# Patient Record
Sex: Male | Born: 1945 | Race: White | Hispanic: No | Marital: Single | Smoking: Never smoker
Health system: Southern US, Community
[De-identification: ages and names within clinical notes are randomized; demographics above are authoritative.]

## PROBLEM LIST (undated history)

## (undated) DIAGNOSIS — G4700 Insomnia, unspecified: Secondary | ICD-10-CM

## (undated) DIAGNOSIS — E119 Type 2 diabetes mellitus without complications: Secondary | ICD-10-CM

## (undated) DIAGNOSIS — C801 Malignant (primary) neoplasm, unspecified: Secondary | ICD-10-CM

## (undated) DIAGNOSIS — E7800 Pure hypercholesterolemia, unspecified: Secondary | ICD-10-CM

## (undated) DIAGNOSIS — C189 Malignant neoplasm of colon, unspecified: Secondary | ICD-10-CM

## (undated) DIAGNOSIS — J189 Pneumonia, unspecified organism: Secondary | ICD-10-CM

## (undated) DIAGNOSIS — Z87442 Personal history of urinary calculi: Secondary | ICD-10-CM

## (undated) DIAGNOSIS — I4891 Unspecified atrial fibrillation: Secondary | ICD-10-CM

## (undated) DIAGNOSIS — I499 Cardiac arrhythmia, unspecified: Secondary | ICD-10-CM

## (undated) DIAGNOSIS — D649 Anemia, unspecified: Secondary | ICD-10-CM

## (undated) HISTORY — PX: COLON SURGERY: SHX602

## (undated) HISTORY — PX: NO PAST SURGERIES: SHX2092

## (undated) HISTORY — PX: COLONOSCOPY: SHX174

## (undated) HISTORY — PX: COLON RESECTION: SHX5231

## (undated) HISTORY — PX: TONSILLECTOMY: SUR1361

## (undated) HISTORY — DX: Insomnia, unspecified: G47.00

## (undated) HISTORY — DX: Type 2 diabetes mellitus without complications: E11.9

## (undated) HISTORY — DX: Pure hypercholesterolemia, unspecified: E78.00

---
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
## 2004-10-25 ENCOUNTER — Inpatient Hospital Stay (HOSPITAL_COMMUNITY): Admission: EM | Admit: 2004-10-25 | Discharge: 2004-10-27 | Payer: Self-pay | Admitting: Emergency Medicine

## 2005-08-24 ENCOUNTER — Encounter: Admission: RE | Admit: 2005-08-24 | Discharge: 2005-08-24 | Payer: Self-pay | Admitting: *Deleted

## 2009-10-02 ENCOUNTER — Emergency Department (HOSPITAL_COMMUNITY): Admission: AC | Admit: 2009-10-02 | Discharge: 2009-10-03 | Payer: Self-pay

## 2010-10-27 LAB — CBC
HCT: 45 % (ref 39.0–52.0)
Hemoglobin: 15.6 g/dL (ref 13.0–17.0)
MCHC: 34.5 g/dL (ref 30.0–36.0)
MCV: 91.6 fL (ref 78.0–100.0)
Platelets: 276 10*3/uL (ref 150–400)
RBC: 4.92 MIL/uL (ref 4.22–5.81)

## 2010-10-27 LAB — POCT I-STAT, CHEM 8
BUN: 19 mg/dL (ref 6–23)
Calcium, Ion: 1 mmol/L — ABNORMAL LOW (ref 1.12–1.32)
Chloride: 107 meq/L (ref 96–112)
Creatinine, Ser: 0.8 mg/dL (ref 0.4–1.5)
Potassium: 3.6 meq/L (ref 3.5–5.1)

## 2010-10-27 LAB — COMPREHENSIVE METABOLIC PANEL
BUN: 17 mg/dL (ref 6–23)
CO2: 25 mEq/L (ref 19–32)
Chloride: 106 mEq/L (ref 96–112)
Creatinine, Ser: 0.87 mg/dL (ref 0.4–1.5)

## 2010-10-27 LAB — DIFFERENTIAL
Eosinophils Relative: 0 % (ref 0–5)
Lymphs Abs: 1.3 10*3/uL (ref 0.7–4.0)
Monocytes Absolute: 0.9 10*3/uL (ref 0.1–1.0)
Monocytes Relative: 4 % (ref 3–12)
Neutrophils Relative %: 90 % — ABNORMAL HIGH (ref 43–77)

## 2010-12-20 NOTE — Discharge Summary (Signed)
NAME:  ESSIE, GEHRET                ACCOUNT NO.:  [id number]   MEDICAL RECORD NO.:  [id number]          PATIENT TYPE:  INP   LOCATION:  3728                         FACILITY:  [facility]   PHYSICIAN:  Francisca December, M.D.  DATE OF BIRTH:  [birth year]   DATE OF ADMISSION:  10/25/2004  DATE OF DISCHARGE:  10/27/2004                                 DISCHARGE SUMMARY   CHIEF COMPLAINT/REASON FOR ADMISSION:  Mr. Mcgillis is a 65 year old male with  no personal coronary risk factors and no significant past medical history,  who presented to the ER with complaints of atypical chest discomfort,  duration less than 24 hours, intermittent pattern with location in the left  anterior chest.  He was awakened at 6:00 a.m. with left anterior chest pain.  The pain would last only a few seconds and did not have any associated  symptoms.  He describes the pain as being quite severe, level 8/10.  The  patient went to work but chest pain continued and he finally presented to  the emergency department.  Prior to the day of admission, he had not had any  type of chest pain or exertional symptoms as dyspnea or chest discomfort or  near syncope or dizziness.  In the ER, he received oxygen and four baby  aspirin and is completely chest pain-free.  It is important to note that the  patient's family medical history is significant for father and sister who  have coronary artery disease and they are diabetic and have a smoking  history.  He has a brother who has MI history and he is a smoker,  noting  that the father, the sister, and the brother are all deceased.  The patient  has never smoked.  Does not use alcohol and lives with his mother.   PHYSICAL EXAMINATION:  Vital signs are stable.  BP 141/73.  Exam was  unremarkable.   Initial EKG showed sinus bradycardia, scooping ST segments in the anterior  leads and lateral leads.  Lead 3 with a horizontal 1 mm depression and  prominent R wave in V3 but there were no old  EKGs for comparison.  His point  of care enzymes were  negative x2.  His lab values were unremarkable.  Chest  x-ray showed cardiomegaly.   The patient was admitted by Dr. Fraser Din with the following diagnoses:  1.  Atypical chest pain.  2.  Borderline hypertension.   HOSPITAL COURSE:  Problem 1. Chest pain.  The patient was admitted to the  telemetry unit where he was started on serial cardiac isoenzyme assessments,  routine telemetry, Lovenox b.i.d.  The patient had no further chest pain by  the following morning.  He did have a mild elevation in his total CK of 367  but troponin stayed normal.  MB was mildly elevated at 11.2.  EKG remained  stable without ischemic changes.  The patient subsequently underwent an  adenosine Cardiolite study with Dr. Amil Amen.  Tolerated this procedure well.  Myoview images are normal with diaphragmatic attenuation.  Cardiac  isoenzymes by the cardiac panel method were  all normal, again only  demonstrating a mild elevation of MB of 11.2, trending downwards to 8.7 just  before discharge with normal troponins and mildly elevated CPKs.  Blood  pressure was 112/62 on date of discharge with a heart rate of 60.  Since  patient remained chest pain-free and Cardiolite was negative, it was  determined that the patient was appropriate for discharge home.   FINAL DIAGNOSES:  1.  Atypical chest pain.  2.  Nonischemic adenosine Myoview.  3.  Hypertension, now controlled off medications.   DISCHARGE MEDICATIONS:  Begin aspirin 81 mg daily, enteric coated.   ACTIVITY:  Out of work until October 30, 2004.   FOLLOW UP APPOINTMENTS:  He is to follow up with [facility], either  Dr. Fraser Din or Dr. Amil Amen if chest pain returns.  He has been given the  phone number to call.  He has also been instructed to return to the hospital  if his chest pain recurs as well.      ALE/MEDQ  D:  12/27/2004  T:  12/28/2004  Job:  [id number]

## 2011-10-15 ENCOUNTER — Ambulatory Visit (INDEPENDENT_AMBULATORY_CARE_PROVIDER_SITE_OTHER): Payer: 59 | Admitting: Physician Assistant

## 2011-10-15 VITALS — BP 151/65 | HR 59 | Temp 97.6°F | Resp 18 | Ht 65.5 in | Wt 191.0 lb

## 2011-10-15 DIAGNOSIS — J40 Bronchitis, not specified as acute or chronic: Secondary | ICD-10-CM

## 2011-10-15 DIAGNOSIS — J019 Acute sinusitis, unspecified: Secondary | ICD-10-CM

## 2011-10-15 DIAGNOSIS — R05 Cough: Secondary | ICD-10-CM

## 2011-10-15 DIAGNOSIS — R059 Cough, unspecified: Secondary | ICD-10-CM

## 2011-10-15 DIAGNOSIS — G4700 Insomnia, unspecified: Secondary | ICD-10-CM

## 2011-10-15 MED ORDER — IPRATROPIUM BROMIDE 0.06 % NA SOLN: 2.0000 | Freq: Three times a day (TID) | NASAL | Status: DC

## 2011-10-15 MED ORDER — ZOLPIDEM TARTRATE 10 MG PO TABS: 10.0000 mg | ORAL_TABLET | Freq: Every evening | ORAL | Status: DC | PRN

## 2011-10-15 MED ORDER — CEFDINIR 300 MG PO CAPS: 300.0000 mg | ORAL_CAPSULE | Freq: Two times a day (BID) | ORAL | Status: AC

## 2011-10-15 MED ORDER — HYDROCODONE-HOMATROPINE 5-1.5 MG/5ML PO SYRP: ORAL_SOLUTION | ORAL | Status: AC

## 2011-10-15 NOTE — Progress Notes (Signed)
Patient ID: William Oneill MRN: [id number], DOB: 1946-05-09, 66 y.o. Date of Encounter: 10/15/2011, 11:01 AM  Primary Physician: No primary provider on file.  Chief Complaint:  Chief Complaint  Patient presents with  . Cough    yellow phlegm  . Sinusitis  . Fatigue    x yesterday    HPI: 66 y.o. year old male presents with 2-3 day history of nasal congestion, post nasal drip, sore throat, sinus pressure, and cough. Afebrile. No chills. Nasal congestion thick and green/yellow. Cough is productive of green/yellow sputum and worse at night. Ears feel full, leading to sensation of muffled hearing. Has tried OTC cold preps without success. No GI complaints. Appetite fair.  No sick contacts, recent antibiotics, or recent travels.   No leg trauma, sedentary periods, h/o cancer, or tobacco use.  Past Medical History  Diagnosis Date  . Insomnia      Home Meds: Prior to Admission medications   Medication Sig Start Date End Date Taking? Authorizing Provider  aspirin 81 MG tablet Take 81 mg by mouth daily.   Yes Historical Provider, MD  multivitamin [facility]) per tablet Take 1 tablet by mouth daily.   Yes Historical Provider, MD    Allergies: No Known Allergies  History   Social History  . Marital Status: Married    Spouse Name: N/A    Number of Children: N/A  . Years of Education: N/A   Occupational History  . Not on file.   Social History Main Topics  . Smoking status: Never Smoker   . Smokeless tobacco: Not on file  . Alcohol Use: Not on file  . Drug Use: Not on file  . Sexually Active: Not on file   Other Topics Concern  . Not on file   Social History Narrative  . No narrative on file     Review of Systems: Constitutional: negative for chills, fever, night sweats or weight changes Cardiovascular: negative for chest pain or palpitations Respiratory: negative for hemoptysis, wheezing, or shortness of breath Abdominal: negative for abdominal pain, nausea,  vomiting or diarrhea Dermatological: negative for rash Neurologic: negative for headache   Physical Exam: Blood pressure 151/65, pulse 59, temperature 97.6 F (36.4 C), temperature source Oral, resp. rate 18, height 5' 5.5" (1.664 m), weight 191 lb (86.637 kg), SpO2 98.00%., Body mass index is 31.30 kg/(m^2). General: Well developed, well nourished, in no acute distress. Head: Normocephalic, atraumatic, eyes without discharge, sclera non-icteric, nares are congested. Bilateral auditory canals clear, TM's are without perforation, pearly grey with reflective cone of light bilaterally. Serous effusion bilaterally behind TM's. Maxillary and frontal sinus TTP bilaterally. Oral cavity moist, dentition normal. Posterior pharynx with post nasal drip and mild erythema. No peritonsillar abscess or tonsillar exudate. Neck: Supple. No thyromegaly. Full ROM. No lymphadenopathy. Lungs: Clear bilaterally to auscultation without wheezes, rales, or rhonchi. Breathing is unlabored.  Heart: RRR with S1 S2. No murmurs, rubs, or gallops appreciated. Msk:  Strength and tone normal for age. Extremities: No clubbing or cyanosis. No edema. Neuro: Alert and oriented X 3. Moves all extremities spontaneously. CNII-XII grossly in tact. Psych:  Responds to questions appropriately with a normal affect.     ASSESSMENT AND PLAN:  66 y.o. year old male with sinobronchitis and insomnia. 1. Sinobronchitis -Omnicef 300 mg 1 po bid #20 no RF  -Atrovent NS 0.06% 2 sprays each nare bid prn #1 no RF -Hycodan #4oz 1 tsp po q 4-6 hours prn cough no RF SED -Mucinex -Tylenol/Motrin  prn -Rest/fluids -RTC precautions -RTC 3-5 days if no improvement  2. Insomnia -Ambien 10 mg #30 1 po qhs prn insomnia no RF -May refill for 2 months  3. Needs CPE -Patient advised to RTC for CPE  Signed, Eula Listen, PA-C 10/15/2011 11:01 AM

## 2011-11-25 ENCOUNTER — Other Ambulatory Visit: Payer: Self-pay

## 2011-11-25 MED ORDER — ZOLPIDEM TARTRATE 10 MG PO TABS: 10.0000 mg | ORAL_TABLET | Freq: Every evening | ORAL | Status: DC | PRN

## 2012-01-05 ENCOUNTER — Ambulatory Visit (INDEPENDENT_AMBULATORY_CARE_PROVIDER_SITE_OTHER): Payer: 59 | Admitting: Family Medicine

## 2012-01-05 ENCOUNTER — Ambulatory Visit: Payer: 59

## 2012-01-05 VITALS — BP 148/77 | HR 60 | Temp 98.6°F | Resp 18 | Ht 66.0 in | Wt 190.0 lb

## 2012-01-05 DIAGNOSIS — G4700 Insomnia, unspecified: Secondary | ICD-10-CM

## 2012-01-05 DIAGNOSIS — J209 Acute bronchitis, unspecified: Secondary | ICD-10-CM

## 2012-01-05 DIAGNOSIS — J029 Acute pharyngitis, unspecified: Secondary | ICD-10-CM

## 2012-01-05 DIAGNOSIS — R05 Cough: Secondary | ICD-10-CM

## 2012-01-05 LAB — POCT CBC
Granulocyte percent: 71.3 %G (ref 37–80)
HCT, POC: 45.2 % (ref 43.5–53.7)
Hemoglobin: 15 g/dL (ref 14.1–18.1)
Lymph, poc: 2.1 (ref 0.6–3.4)
MCH, POC: 29.7 pg (ref 27–31.2)
MCHC: 33.2 g/dL (ref 31.8–35.4)
MCV: 89.5 fL (ref 80–97)
MID (cbc): 0.6 (ref 0–0.9)
MPV: 9.8 fL (ref 0–99.8)
POC Granulocyte: 6.7 (ref 2–6.9)
POC LYMPH PERCENT: 22.5 %L (ref 10–50)
POC MID %: 6.2 %M (ref 0–12)
Platelet Count, POC: 309 10*3/uL (ref 142–424)
RBC: 5.05 M/uL (ref 4.69–6.13)
RDW, POC: 13.2 %
WBC: 9.4 10*3/uL (ref 4.6–10.2)

## 2012-01-05 LAB — POCT RAPID STREP A (OFFICE): Rapid Strep A Screen: NEGATIVE

## 2012-01-05 MED ORDER — AZITHROMYCIN 250 MG PO TABS: ORAL_TABLET | ORAL | Status: AC

## 2012-01-05 MED ORDER — HYDROCODONE-HOMATROPINE 5-1.5 MG/5ML PO SYRP: 5.0000 mL | ORAL_SOLUTION | Freq: Three times a day (TID) | ORAL | Status: AC | PRN

## 2012-01-05 MED ORDER — ZOLPIDEM TARTRATE 10 MG PO TABS: 10.0000 mg | ORAL_TABLET | Freq: Every evening | ORAL | Status: DC | PRN

## 2012-01-05 NOTE — Patient Instructions (Signed)

## 2012-01-05 NOTE — Progress Notes (Signed)
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 He comes in with 2 days of progressive sore throat, headache, myalgia, and cough. He is a much of the night with a cough which is only minimally productive. He had a significant sweating spell overnight and now he is tired but feeling a little better as far as his muscle and body maladies ago.  No nausea vomiting nor diarrhea.  Objective: No acute distress  HEENT: Markedly erythematous throat. He seems to have a mild exophoria with his gaze being slightly disconjugate. There is no icterus. TMs are normal.  Neck is supple with no adenopathy or thyromegaly Chest: Few rales at both bases Heart: No murmur, regular, no gallop or rub Abdomen: Soft nontender without HSM  [facility] reading (PRIMARY) by  Dr. Milus Glazier CXR  cardiomegaly, hazy left lung fields Results for orders placed in visit on 01/05/12  POCT CBC      Component Value Range   WBC 9.4  4.6 - 10.2 (K/uL)   Lymph, poc 2.1  0.6 - 3.4    POC LYMPH PERCENT 22.5  10 - 50 (%L)   MID (cbc) 0.6  0 - 0.9    POC MID % 6.2  0 - 12 (%M)   POC Granulocyte 6.7  2 - 6.9    Granulocyte percent 71.3  37 - 80 (%G)   RBC 5.05  4.69 - 6.13 (M/uL)   Hemoglobin 15.0  14.1 - 18.1 (g/dL)   HCT, POC 16.1  09.6 - 53.7 (%)   MCV 89.5  80 - 97 (fL)   MCH, POC 29.7  27 - 31.2 (pg)   MCHC 33.2  31.8 - 35.4 (g/dL)   RDW, POC 04.5     Platelet Count, POC 309  142 - 424 (K/uL)   MPV 9.8  0 - 99.8 (fL)  POCT RAPID STREP A (OFFICE)      Component Value Range   Rapid Strep A Screen Negative  Negative    . Assessment: Bronchitis, acute without significant respiratory compromise  Plan: Z-Pak and Hydromet with followup in 40

## 2012-01-07 ENCOUNTER — Ambulatory Visit (INDEPENDENT_AMBULATORY_CARE_PROVIDER_SITE_OTHER): Payer: 59 | Admitting: Family Medicine

## 2012-01-07 VITALS — BP 118/68 | HR 65 | Temp 97.5°F | Resp 18 | Ht 66.0 in | Wt 190.0 lb

## 2012-01-07 DIAGNOSIS — J40 Bronchitis, not specified as acute or chronic: Secondary | ICD-10-CM

## 2012-01-07 DIAGNOSIS — J209 Acute bronchitis, unspecified: Secondary | ICD-10-CM

## 2012-01-07 MED ORDER — PREDNISONE 20 MG PO TABS: 40.0000 mg | ORAL_TABLET | Freq: Every day | ORAL | Status: AC

## 2012-01-07 NOTE — Progress Notes (Signed)
66 year old gentleman comes in for recheck of his bronchitis. Overall he is having less respiratory symptoms-less cough and shortness of breath-but he still is feeling very tired and complains of a running nose.  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 He's been staying at home lately though she can't keep up on his energy level.  He's had no new fever, no nausea or vomiting, and he's been taking his medicine as directed.  Objective: No acute distress HEENT: Unremarkable Neck: Supple no adenopathy or thyromegaly Chest: No rhonchi or rales, good breath sounds bilaterally Heart: Regular no murmur Skin: No rashes Extremities: No edema  Assessment: Gradual but slow improvement on the bronchitis.  Plan: Add prednisone 40 mg daily x5 and recheck if symptoms persist. 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

## 2012-02-21 ENCOUNTER — Other Ambulatory Visit: Payer: Self-pay | Admitting: Physician Assistant

## 2012-02-23 ENCOUNTER — Other Ambulatory Visit: Payer: Self-pay

## 2012-03-30 ENCOUNTER — Other Ambulatory Visit: Payer: Self-pay

## 2012-03-30 MED ORDER — ZOLPIDEM TARTRATE 10 MG PO TABS: 10.0000 mg | ORAL_TABLET | Freq: Every evening | ORAL | Status: DC | PRN

## 2012-05-02 ENCOUNTER — Other Ambulatory Visit: Payer: Self-pay | Admitting: *Deleted

## 2012-05-02 MED ORDER — ZOLPIDEM TARTRATE 10 MG PO TABS: 10.0000 mg | ORAL_TABLET | Freq: Every evening | ORAL | Status: DC | PRN

## 2012-06-08 ENCOUNTER — Other Ambulatory Visit: Payer: Self-pay | Admitting: Physician Assistant

## 2012-06-10 ENCOUNTER — Other Ambulatory Visit: Payer: Self-pay | Admitting: Radiology

## 2012-07-20 ENCOUNTER — Other Ambulatory Visit: Payer: Self-pay | Admitting: Physician Assistant

## 2012-07-21 ENCOUNTER — Other Ambulatory Visit: Payer: Self-pay | Admitting: Radiology

## 2012-11-13 ENCOUNTER — Other Ambulatory Visit: Payer: Self-pay | Admitting: Family Medicine

## 2012-12-15 ENCOUNTER — Telehealth: Payer: Self-pay

## 2012-12-15 NOTE — Telephone Encounter (Signed)
Pharm requests RF of zolpidem 10 mg.

## 2012-12-16 ENCOUNTER — Other Ambulatory Visit: Payer: Self-pay | Admitting: Family Medicine

## 2012-12-16 DIAGNOSIS — G4700 Insomnia, unspecified: Secondary | ICD-10-CM

## 2012-12-16 MED ORDER — ZOLPIDEM TARTRATE 10 MG PO TABS: 10.0000 mg | ORAL_TABLET | Freq: Every evening | ORAL | Status: DC | PRN

## 2012-12-16 NOTE — Telephone Encounter (Signed)
Faxed prescription for Ambien 10 mg to [facility] pharmacy [phone], per Dr Milus Glazier.

## 2012-12-16 NOTE — Telephone Encounter (Signed)
Dr L, there is also a phone message in concerning this RF.

## 2012-12-16 NOTE — Telephone Encounter (Signed)
I refilled this 

## 2013-04-29 ENCOUNTER — Other Ambulatory Visit: Payer: Self-pay | Admitting: Family Medicine

## 2013-04-29 NOTE — Telephone Encounter (Signed)
Please advise on refill of Ambien

## 2013-04-30 ENCOUNTER — Other Ambulatory Visit: Payer: Self-pay

## 2013-06-09 ENCOUNTER — Other Ambulatory Visit: Payer: Self-pay | Admitting: Family Medicine

## 2013-06-11 ENCOUNTER — Other Ambulatory Visit: Payer: Self-pay | Admitting: Family Medicine

## 2013-09-14 ENCOUNTER — Other Ambulatory Visit: Payer: Self-pay | Admitting: Family Medicine

## 2013-09-14 NOTE — Telephone Encounter (Signed)
Ready

## 2013-09-15 NOTE — Telephone Encounter (Signed)
Pt advised.

## 2013-09-19 ENCOUNTER — Ambulatory Visit: Payer: Medicare Other

## 2013-09-19 ENCOUNTER — Ambulatory Visit (INDEPENDENT_AMBULATORY_CARE_PROVIDER_SITE_OTHER): Payer: Medicare Other | Admitting: Family Medicine

## 2013-09-19 VITALS — BP 134/64 | HR 62 | Temp 97.9°F | Ht 65.0 in | Wt 184.4 lb

## 2013-09-19 DIAGNOSIS — R059 Cough, unspecified: Secondary | ICD-10-CM

## 2013-09-19 DIAGNOSIS — R05 Cough: Secondary | ICD-10-CM

## 2013-09-19 DIAGNOSIS — R5383 Other fatigue: Secondary | ICD-10-CM

## 2013-09-19 DIAGNOSIS — J029 Acute pharyngitis, unspecified: Secondary | ICD-10-CM

## 2013-09-19 DIAGNOSIS — R5381 Other malaise: Secondary | ICD-10-CM

## 2013-09-19 LAB — POCT RAPID STREP A (OFFICE): Rapid Strep A Screen: NEGATIVE

## 2013-09-19 MED ORDER — DOXYCYCLINE HYCLATE 100 MG PO CAPS: 100.0000 mg | ORAL_CAPSULE | Freq: Two times a day (BID) | ORAL | Status: DC
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
## 2013-09-19 NOTE — Patient Instructions (Signed)
Please let me know if you are not better soon.  Take the doxycycline as directed for your lungs.

## 2013-09-20 ENCOUNTER — Encounter: Payer: Self-pay | Admitting: Family Medicine

## 2013-09-23 ENCOUNTER — Telehealth: Payer: Self-pay

## 2013-09-23 NOTE — Telephone Encounter (Signed)
PA was needed to cover Qhs use of zolpidem (plan only covers 90 days per year). Completed PA on phone and received approval through 08/03/2014, case # [id number]. Notified pt.

## 2013-09-27 ENCOUNTER — Telehealth: Payer: Self-pay

## 2013-09-27 NOTE — Telephone Encounter (Signed)
PATIENT STATES HE SAW DR. Lorelei Pont ABOUT A WEEK AGO FOR A COUGH. THE ANTIBIOTIC SHE PRESCRIBED HIM IS NOT HELPING HIS CHEST CONGESTION AND HE ALSO HAS TO CLEAR HIS THROAT ALL THE TIME. HE WOULD LIKE TO GET A DIFFERENT ANTIBIOTIC CALLED INTO HIS PHARMACY. HE ALSO RECEIVED A LETTER ABOUT HIS CHEST X-RAYS. HE WOULD LIKE TO GO OVER THE RESULTS WITH DR. Carlota Raspberry OR DR. Lorelei Pont. BEST  PHONE [phone] (HOME)  [place].  [place]
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
## 2013-09-28 NOTE — Telephone Encounter (Signed)
His recent CXR:  CHEST 2 VIEW  COMPARISON: A prior chest x-ray 01/05/2012  FINDINGS:  Stable cardiomegaly with left atrial enlargement. Mediastinal  contours remain unchanged. Atherosclerotic calcification in the  transverse aorta. Mild prominence of the bibasilar interstitial  markings demonstrate no interval change her therefore likely  chronic. Stable background pulmonary changes consistent with COPD.  No acute osseous abnormality.  IMPRESSION:  1. Stable chest x-ray without evidence of active cardiopulmonary  disease.  2. Cardiomegaly with left atrial enlargement.  3. Aortic atherosclerosis.  4. Background lung changes suggest COPD.   Called him back.  He does not have a fever.  His main sx right now is that he feels hoarse and has to clear his throat a lot.  He wonders is another abx would be helpful for him.  Let him know that I do not think another abx will help with this- encouraged mucinex as Pamala Hurry suggested, fluids and throat drops.  He will come and see [place] in the next couple of weeks to further evaluate his COPD.

## 2013-09-28 NOTE — Telephone Encounter (Signed)
LM with male for pt to return call.  

## 2013-10-08 ENCOUNTER — Encounter (HOSPITAL_COMMUNITY): Payer: Self-pay | Admitting: Emergency Medicine

## 2013-10-08 ENCOUNTER — Emergency Department (HOSPITAL_COMMUNITY): Payer: Medicare Other

## 2013-10-08 DIAGNOSIS — R05 Cough: Secondary | ICD-10-CM | POA: Insufficient documentation

## 2013-10-08 DIAGNOSIS — R059 Cough, unspecified: Secondary | ICD-10-CM | POA: Insufficient documentation

## 2013-10-08 DIAGNOSIS — R52 Pain, unspecified: Secondary | ICD-10-CM | POA: Insufficient documentation

## 2013-10-08 DIAGNOSIS — Z8709 Personal history of other diseases of the respiratory system: Secondary | ICD-10-CM | POA: Insufficient documentation

## 2013-10-08 DIAGNOSIS — Z79899 Other long term (current) drug therapy: Secondary | ICD-10-CM | POA: Insufficient documentation

## 2013-10-08 DIAGNOSIS — G4700 Insomnia, unspecified: Secondary | ICD-10-CM | POA: Insufficient documentation

## 2013-10-08 DIAGNOSIS — Z7982 Long term (current) use of aspirin: Secondary | ICD-10-CM | POA: Insufficient documentation

## 2013-10-08 NOTE — ED Notes (Addendum)
sts has cough/clearing of throat since feb that he cannot get rid of.  He also c/o of body aches all over.

## 2013-10-09 ENCOUNTER — Emergency Department (HOSPITAL_COMMUNITY)
Admission: EM | Admit: 2013-10-09 | Discharge: 2013-10-09 | Disposition: A | Discharge status: Home / Self Care | Payer: Medicare Other | Attending: Emergency Medicine | Admitting: Emergency Medicine

## 2013-10-09 DIAGNOSIS — R05 Cough: Secondary | ICD-10-CM

## 2013-10-09 DIAGNOSIS — R053 Chronic cough: Secondary | ICD-10-CM

## 2013-10-09 MED ORDER — FAMOTIDINE 20 MG PO TABS
40.0000 mg | ORAL_TABLET | Freq: Every day | ORAL | Status: DC
  Administered 2013-10-09: 40 mg via ORAL
  Filled 2013-10-09: qty 2

## 2013-10-09 MED ORDER — BENZONATATE 100 MG PO CAPS
200.0000 mg | ORAL_CAPSULE | Freq: Three times a day (TID) | ORAL | Status: DC | PRN
  Administered 2013-10-09: 200 mg via ORAL
  Filled 2013-10-09: qty 2

## 2013-10-09 MED ORDER — FAMOTIDINE 40 MG PO TABS: 40.0000 mg | ORAL_TABLET | Freq: Every day | ORAL | Status: DC

## 2013-10-09 MED ORDER — GI COCKTAIL ~~LOC~~
30.0000 mL | Freq: Once | ORAL | Status: AC
  Administered 2013-10-09: 30 mL via ORAL
  Filled 2013-10-09: qty 30

## 2013-10-09 MED ORDER — BENZONATATE 200 MG PO CAPS: 200.0000 mg | ORAL_CAPSULE | Freq: Three times a day (TID) | ORAL | Status: DC | PRN

## 2013-10-09 NOTE — ED Provider Notes (Signed)
CSN: [id number]     Arrival date & time 10/08/13  2245 History   First MD Initiated Contact with Patient 10/09/13 [phone]     Chief Complaint  Patient presents with  . Cough  . Generalized Body Aches     (Consider location/radiation/quality/duration/timing/severity/associated sxs/prior Treatment) HPI 68 year old male presents to emergency room from home with complaint of persistent throat clearing.  Patient is concerned he has persistent influenza.  He was diagnosed a month ago with influenza, placed on doxycycline.  He, reports he's finished the antibiotics, but still is having the sensation that he needs to cough.  He denies any production with his cough.  He reports a dry scratchiness to the back of his throat.  No fevers no chills.  He reports myalgias.  Patient contacted his primary care doctor, who recommended taking Mucinex.  Patient denies any acid brash or history of reflux.  Symptoms are worse with laying down, but reports constantly having to cough and clear his throat.  Past Medical History  Diagnosis Date  . Insomnia    History reviewed. No pertinent past surgical history. History reviewed. No pertinent family history. History  Substance Use Topics  . Smoking status: Never Smoker   . Smokeless tobacco: Not on file  . Alcohol Use: No    Review of Systems  See History of Present Illness; otherwise all other systems are reviewed and negative   Allergies  Review of patient's allergies indicates no known allergies.  Home Medications   Current Outpatient Rx  Name  Route  Sig  Dispense  Refill  . aspirin 81 MG tablet   Oral   Take 81 mg by mouth daily.         . benzonatate (TESSALON) 200 MG capsule   Oral   Take 1 capsule (200 mg total) by mouth 3 (three) times daily as needed for cough.   20 capsule   0   . doxycycline (VIBRAMYCIN) 100 MG capsule   Oral   Take 1 capsule (100 mg total) by mouth 2 (two) times daily.   20 capsule   0   . famotidine (PEPCID) 40  MG tablet   Oral   Take 1 tablet (40 mg total) by mouth daily.   30 tablet   0   . multivitamin (THERAGRAN) per tablet   Oral   Take 1 tablet by mouth daily.         Marland Kitchen zolpidem (AMBIEN) 10 MG tablet      TAKE 1 TABLET BY MOUTH EVERY NIGHT AT BEDTIME AS NEEDED FOR SLEEP   30 tablet   0    BP 117/76  Pulse 54  Temp(Src) 98.5 F (36.9 C) (Oral)  Resp 16  Ht 5\' 7"  (1.702 m)  Wt 192 lb (87.091 kg)  BMI 30.06 kg/m2  SpO2 95% Physical Exam  Nursing note and vitals reviewed. Constitutional: He is oriented to person, place, and time. He appears well-developed and well-nourished.  HENT:  Head: Normocephalic and atraumatic.  Right Ear: External ear normal.  Left Ear: External ear normal.  Nose: Nose normal.  Mouth/Throat: Oropharynx is clear and moist.  Eyes: Conjunctivae and EOM are normal. Pupils are equal, round, and reactive to light.  Neck: Normal range of motion. Neck supple. No JVD present. No tracheal deviation present. No thyromegaly present.  No stridor noted  Cardiovascular: Normal rate, regular rhythm, normal heart sounds and intact distal pulses.  Exam reveals no gallop and no friction rub.   No murmur  heard. Pulmonary/Chest: Effort normal and breath sounds normal. No stridor. No respiratory distress. He has no wheezes. He has no rales. He exhibits no tenderness.  Abdominal: Soft. Bowel sounds are normal. He exhibits no distension and no mass. There is no tenderness. There is no rebound and no guarding.  Musculoskeletal: Normal range of motion. He exhibits no edema and no tenderness.  Lymphadenopathy:    He has no cervical adenopathy.  Neurological: He is alert and oriented to person, place, and time. He has normal reflexes. No cranial nerve deficit. He exhibits normal muscle tone. Coordination normal.  Skin: Skin is warm and dry. No rash noted. No erythema. No pallor.  Psychiatric: His behavior is normal. Judgment and thought content normal.  odd affect    ED  Course  Procedures (including critical care time) Labs Review Labs Reviewed - No data to display Imaging Review Dg Chest 2 View (if Patient Has Fever And/or Copd)  10/09/2013   CLINICAL DATA:  Upper respiratory symptoms following influenza.  EXAM: CHEST  2 VIEW  COMPARISON:  09/19/2013.  FINDINGS: Mild cardiac enlargement is stable. Lung fields are clear. No effusion or pneumothorax. No acute osseous findings.  IMPRESSION: Stable chest.  No active cardiopulmonary disease.   Electronically Signed   By: Rolla Flatten M.D.   On: 10/09/2013 00:35     EKG Interpretation None      MDM   Final diagnoses:  Cough, persistent    68 year old with persistent cough.  Exam unremarkable.  Will start on Tessalon Perles to help with symptoms and Pepcid for possible GERD source.  Patient to followup with his doctor this week for recheck.    Kalman Drape, MD 10/09/13 [phone]

## 2013-10-09 NOTE — Discharge Instructions (Signed)
Please take medication as prescribed.  Followup with Dr. Edilia Bo within 3-5 days.  If your symptoms continue, you may need to be seen by a ENT specialist for further evaluation.    Cough, Adult  A cough is a reflex that helps clear your throat and airways. It can help heal the body or may be a reaction to an irritated airway. A cough may only last 2 or 3 weeks (acute) or may last more than 8 weeks (chronic).  CAUSES Acute cough:  Viral or bacterial infections. Chronic cough:  Infections.  Allergies.  Asthma.  Post-nasal drip.  Smoking.  Heartburn or acid reflux.  Some medicines.  Chronic lung problems (COPD).  Cancer. SYMPTOMS   Cough.  Fever.  Chest pain.  Increased breathing rate.  High-pitched whistling sound when breathing (wheezing).  Colored mucus that you cough up (sputum). TREATMENT   A bacterial cough may be treated with antibiotic medicine.  A viral cough must run its course and will not respond to antibiotics.  Your caregiver may recommend other treatments if you have a chronic cough. HOME CARE INSTRUCTIONS   Only take over-the-counter or prescription medicines for pain, discomfort, or fever as directed by your caregiver. Use cough suppressants only as directed by your caregiver.  Use a cold steam vaporizer or humidifier in your bedroom or home to help loosen secretions.  Sleep in a semi-upright position if your cough is worse at night.  Rest as needed.  Stop smoking if you smoke. SEEK IMMEDIATE MEDICAL CARE IF:   You have pus in your sputum.  Your cough starts to worsen.  You cannot control your cough with suppressants and are losing sleep.  You begin coughing up blood.  You have difficulty breathing.  You develop pain which is getting worse or is uncontrolled with medicine.  You have a fever. MAKE SURE YOU:   Understand these instructions.  Will watch your condition.  Will get help right away if you are not doing well or  get worse. Document Released: 01/17/2011 Document Revised: 10/13/2011 Document Reviewed: 01/17/2011 [facility] Patient Information 2014 [place].

## 2013-10-09 NOTE — ED Notes (Signed)
Pt. Reports diagnosed with flu in February, states was feeling better but kept having to "clear throat". Given antibiotics by PCP, finished prescription. Pt. Then started taking mucinex and still does not have relief. States "I've never had to clear my throat this much in my life". Pt. Denies productive cough. Alert and oriented x4.

## 2013-10-13 ENCOUNTER — Ambulatory Visit (INDEPENDENT_AMBULATORY_CARE_PROVIDER_SITE_OTHER): Payer: Medicare Other | Admitting: Family Medicine

## 2013-10-13 VITALS — BP 132/80 | HR 60 | Temp 97.4°F | Resp 16 | Ht 65.5 in | Wt 188.0 lb

## 2013-10-13 DIAGNOSIS — Z609 Problem related to social environment, unspecified: Secondary | ICD-10-CM

## 2013-10-13 DIAGNOSIS — G4700 Insomnia, unspecified: Secondary | ICD-10-CM

## 2013-10-13 DIAGNOSIS — Z604 Social exclusion and rejection: Secondary | ICD-10-CM

## 2013-10-13 DIAGNOSIS — R05 Cough: Secondary | ICD-10-CM

## 2013-10-13 DIAGNOSIS — R059 Cough, unspecified: Secondary | ICD-10-CM

## 2013-10-13 LAB — COMPREHENSIVE METABOLIC PANEL
ALT: 22 U/L (ref 0–53)
AST: 17 U/L (ref 0–37)
Albumin: 4.3 g/dL (ref 3.5–5.2)
Alkaline Phosphatase: 69 U/L (ref 39–117)
BILIRUBIN TOTAL: 1.2 mg/dL (ref 0.2–1.2)
BUN: 16 mg/dL (ref 6–23)
CALCIUM: 9.5 mg/dL (ref 8.4–10.5)
CO2: 31 meq/L (ref 19–32)
CREATININE: 0.79 mg/dL (ref 0.50–1.35)
Chloride: 102 mEq/L (ref 96–112)
GLUCOSE: 272 mg/dL — AB (ref 70–99)
Potassium: 4.8 mEq/L (ref 3.5–5.3)
Sodium: 139 mEq/L (ref 135–145)
Total Protein: 6.7 g/dL (ref 6.0–8.3)

## 2013-10-13 LAB — CBC
HEMATOCRIT: 44.3 % (ref 39.0–52.0)
HEMOGLOBIN: 15.3 g/dL (ref 13.0–17.0)
MCH: 30.1 pg (ref 26.0–34.0)
MCHC: 34.5 g/dL (ref 30.0–36.0)
MCV: 87 fL (ref 78.0–100.0)
Platelets: 296 10*3/uL (ref 150–400)
RBC: 5.09 MIL/uL (ref 4.22–5.81)
RDW: 13.8 % (ref 11.5–15.5)
WBC: 8.8 10*3/uL (ref 4.0–10.5)

## 2013-10-13 MED ORDER — TRAZODONE HCL 50 MG PO TABS: ORAL_TABLET | ORAL | Status: DC

## 2013-10-13 NOTE — Progress Notes (Signed)
[facility] [address], [place] [address] [phone]  Date:  10/13/2013   Name:  William Oneill   DOB:  08-Jul-1946   MRN:  [id number]  PCP:  Lamar Blinks, MD    Chief Complaint: Follow-up and Medication Dose Change   History of Present Illness:  William Oneill is a 68 y.o. very pleasant male patient who presents with the following:  Seen by myself about one month ago with cough and ST.  Negative strep, treated with doxycyline.   CXR showed the following  CHEST 2 VIEW  COMPARISON: A prior chest x-ray 01/05/2012  FINDINGS:  Stable cardiomegaly with left atrial enlargement. Mediastinal  contours remain unchanged. Atherosclerotic calcification in the  transverse aorta. Mild prominence of the bibasilar interstitial  markings demonstrate no interval change her therefore likely  chronic. Stable background pulmonary changes consistent with COPD.  No acute osseous abnormality.  IMPRESSION:  1. Stable chest x-ray without evidence of active cardiopulmonary  disease.  2. Cardiomegaly with left atrial enlargement.  3. Aortic atherosclerosis.  4. Background lung changes suggest COPD.   He then went to the ER on 3/8 with cough and body aches.  He was given tessalon perles and pepcid for possible GERD CXR showed the following: CHEST 2 VIEW  COMPARISON: 09/19/2013.  FINDINGS:  Mild cardiac enlargement is stable. Lung fields are clear. No  effusion or pneumothorax. No acute osseous findings.  IMPRESSION:  Stable chest. No active cardiopulmonary disease.   He is still using the tessalon perles and pepcid.  He fees like he is better. However, he notes problems with sleep.   He got up this am at 0300.  He has been retired for 6 years- since his retirement he has noted insomnia.  He has trouble going to sleep and will wake up too early.  He has noticed this for about a year or so.   This occurs nearly every night.  It seems to be getting worse.  He will  usually wake up around 8:30 or 9am, and may go to bed around 9pm.  He may go to sleep around 3 or 4 in the am.  He states this occurs even with ambien.    He admits he may have some depression sx.  He feels like his current illness has "gotten me down." He has been taking ambien for 2 years or more.  However it has not seemed to help much over the last 6 months.  He had been taking it every day, but has been out now since yesterday.   He does not snore.   He lives with his sister and brother in [place].  It seems he is somewhat socially isolated.  Admits he does not have much social contact or structure to his day.    He has no known history of heart problems, he does take a baby aspirin daily.   Patient Active Problem List   Diagnosis Date Noted  . Insomnia     Past Medical History  Diagnosis Date  . Insomnia     No past surgical history on file.  History  Substance Use Topics  . Smoking status: Never Smoker   . Smokeless tobacco: Not on file  . Alcohol Use: No    No family history on file.  No Known Allergies  Medication list has been reviewed and updated.  Current Outpatient Prescriptions on File Prior to Visit  Medication Sig Dispense Refill  . aspirin 81 MG  tablet Take 81 mg by mouth daily.      . benzonatate (TESSALON) 200 MG capsule Take 1 capsule (200 mg total) by mouth 3 (three) times daily as needed for cough.  20 capsule  0  . famotidine (PEPCID) 40 MG tablet Take 1 tablet (40 mg total) by mouth daily.  30 tablet  0  . multivitamin (THERAGRAN) per tablet Take 1 tablet by mouth daily.      Marland Kitchen zolpidem (AMBIEN) 10 MG tablet TAKE 1 TABLET BY MOUTH EVERY NIGHT AT BEDTIME AS NEEDED FOR SLEEP  30 tablet  0  . doxycycline (VIBRAMYCIN) 100 MG capsule Take 1 capsule (100 mg total) by mouth 2 (two) times daily.  20 capsule  0   No current facility-administered medications on file prior to visit.    Review of Systems:  As per HPI- otherwise negative.   Physical  Examination: Filed Vitals:   10/13/13 1009  BP: 156/77  Pulse: 60  Temp: 97.4 F (36.3 C)  Resp: 16   Filed Vitals:   10/13/13 1009  Height: 5' 5.5" (1.664 m)  Weight: 188 lb (85.276 kg)   Body mass index is 30.8 kg/(m^2). Ideal Body Weight: Weight in (lb) to have BMI = 25: 152.2  GEN: WDWN, NAD, Non-toxic, A & O x 3, overweight, looks well HEENT: Atraumatic, Normocephalic. Neck supple. No masses, No LAD.  Bilateral TM wnl, oropharynx normal.  PEERL,EOMI.   Ears and Nose: No external deformity. CV: RRR, No M/G/R. No JVD. No thrill. No extra heart sounds. PULM: CTA B, no wheezes, crackles, rhonchi. No retractions. No resp. distress. No accessory muscle use. ABD: S, NT, ND EXTR: No c/c/e NEURO Normal gait.  PSYCH: Normally interactive. Conversant. Not depressed or anxious appearing.  Calm demeanor.    Assessment and Plan: Insomnia - Plan: traZODone (DESYREL) 50 MG tablet  Reassured that his most recent CXR was negative.  We tried spirometry today but he was not able to give a good effort. Will try again another day.  discussed the need to work on his sleep hygiene, and the need to bring more structure, activity and social interaction into his schedule.  We will try trazodone instead of ambien as I believe this maybe safer and will possibly be helpful in treating his depression.   We will recheck in 2 months to look at his progress. He does show some arterial plaque build- up on his CXR so will need to manage his risk factors, check cholesterol  Signed Lamar Blinks, MD

## 2013-10-13 NOTE — Patient Instructions (Addendum)
Call your insurance company and ask them about the "silver sneakers" program.  This is a free or low cost exercise program that is available at [employer] facilities.  In any case, work on getting more exercise, find things to do during the day and try to spend time in social activities.  Having a more structured and active day will help you sleep better at night.   Use the trazadone as directed at bedtime to help with sleep.  Do not take this with Ambien- use one or the other.    Continue to use the tessalon and pepcid as needed for cough.

## 2013-10-14 ENCOUNTER — Encounter: Payer: Self-pay | Admitting: Family Medicine

## 2013-10-14 LAB — TSH: TSH: 2.151 u[IU]/mL (ref 0.350–4.500)

## 2013-10-28 ENCOUNTER — Ambulatory Visit (INDEPENDENT_AMBULATORY_CARE_PROVIDER_SITE_OTHER): Payer: Medicare Other | Admitting: Emergency Medicine

## 2013-10-28 VITALS — BP 120/60 | HR 50 | Temp 98.3°F | Resp 16 | Ht 65.0 in | Wt 187.0 lb

## 2013-10-28 DIAGNOSIS — J018 Other acute sinusitis: Secondary | ICD-10-CM

## 2013-10-28 MED ORDER — AMOXICILLIN-POT CLAVULANATE 875-125 MG PO TABS: 1.0000 | ORAL_TABLET | Freq: Two times a day (BID) | ORAL | Status: DC

## 2013-10-28 NOTE — Progress Notes (Signed)
[facility] [address] [place] [address] [phone]  Date:  10/28/2013   Name:  William Oneill   DOB:  05/11/1946   MRN:  [id number]  PCP:  Lamar Blinks, MD    Chief Complaint: sinus drainage   History of Present Illness:  William Oneill is a 68 y.o. very pleasant male patient who presents with the following:  Comes in today with nasal congestion and a post nasal drainage that is purulent in nature since past three days.  No cough, wheezing or shortness of breath.  No sputum production.  No nasal discharge.  Has pain and pressure in cheeks.  No fever or chills.  Feels "bad" overall.  Says he has a fair amount of trouble with his sinuses.  No improvement with over the counter medications or other home remedies. Denies other complaint or health concern today.  Not fasting today.   Too sick to tell if trazadone is working or not.  Patient Active Problem List   Diagnosis Date Noted  . Insomnia     Past Medical History  Diagnosis Date  . Insomnia     History reviewed. No pertinent past surgical history.  History  Substance Use Topics  . Smoking status: Never Smoker   . Smokeless tobacco: Not on file  . Alcohol Use: No    No family history on file.  No Known Allergies  Medication list has been reviewed and updated.  Current Outpatient Prescriptions on File Prior to Visit  Medication Sig Dispense Refill  . aspirin 81 MG tablet Take 81 mg by mouth daily.      . multivitamin (THERAGRAN) per tablet Take 1 tablet by mouth daily.      . benzonatate (TESSALON) 200 MG capsule Take 1 capsule (200 mg total) by mouth 3 (three) times daily as needed for cough.  20 capsule  0  . famotidine (PEPCID) 40 MG tablet Take 1 tablet (40 mg total) by mouth daily.  30 tablet  0  . traZODone (DESYREL) 50 MG tablet Use for sleep.  Start with a 1/2 tablet and increase to a whole tablet after a week as needed  30 tablet  3   No current facility-administered  medications on file prior to visit.    Review of Systems:  As per HPI, otherwise negative.    Physical Examination: Filed Vitals:   10/28/13 1149  BP: 120/60  Pulse: 50  Temp: 98.3 F (36.8 C)  Resp: 16   Filed Vitals:   10/28/13 1149  Height: 5\' 5"  (1.651 m)  Weight: 187 lb (84.823 kg)   Body mass index is 31.12 kg/(m^2). Ideal Body Weight: Weight in (lb) to have BMI = 25: 149.9  GEN: WDWN, NAD, Non-toxic, A & O x 3 HEENT: Atraumatic, Normocephalic. Neck supple. No masses, No LAD. Ears and Nose: No external deformity. CV: RRR, No M/G/R. No JVD. No thrill. No extra heart sounds. PULM: CTA B, no wheezes, crackles, rhonchi. No retractions. No resp. distress. No accessory muscle use. ABD: S, NT, ND, +BS. No rebound. No HSM. EXTR: No c/c/e NEURO Normal gait.  PSYCH: Normally interactive. Conversant. Not depressed or anxious appearing.  Calm demeanor.    Assessment and Plan: Sinusitis augmentin mucinex   Signed,  Ellison Carwin, MD

## 2013-10-28 NOTE — Patient Instructions (Signed)

## 2014-04-21 ENCOUNTER — Telehealth: Payer: Self-pay | Admitting: *Deleted

## 2014-04-21 NOTE — Telephone Encounter (Signed)
[facility] me for patient to schedule AMWE at either main # or my direct #.

## 2014-04-29 ENCOUNTER — Ambulatory Visit (INDEPENDENT_AMBULATORY_CARE_PROVIDER_SITE_OTHER): Payer: Medicare Other | Admitting: Internal Medicine

## 2014-04-29 VITALS — BP 114/60 | HR 74 | Temp 97.8°F | Resp 16 | Ht 65.0 in | Wt 188.6 lb

## 2014-04-29 DIAGNOSIS — L03012 Cellulitis of left finger: Secondary | ICD-10-CM

## 2014-04-29 DIAGNOSIS — L03019 Cellulitis of unspecified finger: Secondary | ICD-10-CM

## 2014-04-29 DIAGNOSIS — L02519 Cutaneous abscess of unspecified hand: Secondary | ICD-10-CM

## 2014-04-29 NOTE — Progress Notes (Signed)
Procedure:  Consent obtained - MC block with 1% lido.  #11 blade used to open pustule.  Splinter removed. Drsg placed.  Wound care d/w pt.

## 2014-04-29 NOTE — Progress Notes (Signed)
Splinter from wood in finger 4 d. Developed redness, tenderness, Pus! Self lance = no help. Now w/ redness , tenderness affecting activity  Exam BP 114/60  Pulse 74  Temp(Src) 97.8 F (36.6 C) (Oral)  Resp 16  Ht 5\' 5"  (1.651 m)  Wt 188 lb 9.6 oz (85.548 kg)  BMI 31.38 kg/m2  SpO2 99% R index with pustular lesion 2cm radial aspect index at PIP Joint not involved. FB palpable.  Procedure by PA Weber  IMP- P#1 FB finger-wood P#2 secondary infec  See proced note----superficial/wood easily removed Needs hot soaks plus scrubbing bid   I have completed the patient encounter in its entirety as documented by the [facility], with editing by me where necessary. Delayne Sanzo P. Laney Pastor, M.D.

## 2014-05-04 ENCOUNTER — Telehealth: Payer: Self-pay | Admitting: *Deleted

## 2014-05-04 NOTE — Telephone Encounter (Signed)
Phoned patient & [place] me to schedule AMWE.  Left main office # as well as my direct #.

## 2014-05-12 ENCOUNTER — Encounter (HOSPITAL_COMMUNITY): Payer: Self-pay | Admitting: Emergency Medicine

## 2014-05-12 ENCOUNTER — Telehealth: Payer: Self-pay | Admitting: *Deleted

## 2014-05-12 ENCOUNTER — Emergency Department (INDEPENDENT_AMBULATORY_CARE_PROVIDER_SITE_OTHER)
Admission: EM | Admit: 2014-05-12 | Discharge: 2014-05-12 | Disposition: A | Discharge status: Home / Self Care | Payer: Medicare Other | Source: Home / Self Care | Attending: Family Medicine | Admitting: Family Medicine

## 2014-05-12 DIAGNOSIS — J069 Acute upper respiratory infection, unspecified: Secondary | ICD-10-CM

## 2014-05-12 MED ORDER — GUAIFENESIN-CODEINE 100-10 MG/5ML PO SOLN: 5.0000 mL | Freq: Every evening | ORAL | Status: DC | PRN

## 2014-05-12 MED ORDER — IPRATROPIUM BROMIDE 0.06 % NA SOLN: 2.0000 | Freq: Four times a day (QID) | NASAL | Status: DC

## 2014-05-12 NOTE — ED Notes (Signed)
C/o cold sx onset yest Sx include: ST, sneezing, congestion, runny nose, cough Denies f/v/n/d Alert, no signs of acute distress.

## 2014-05-12 NOTE — Discharge Instructions (Signed)
Thank you for coming in today. Call or go to the emergency room if you get worse, have trouble breathing, have chest pains, or palpitations.  Do not drive after taking codeine.   Cough, Adult  A cough is a reflex that helps clear your throat and airways. It can help heal the body or may be a reaction to an irritated airway. A cough may only last 2 or 3 weeks (acute) or may last more than 8 weeks (chronic).  CAUSES Acute cough:  Viral or bacterial infections. Chronic cough:  Infections.  Allergies.  Asthma.  Post-nasal drip.  Smoking.  Heartburn or acid reflux.  Some medicines.  Chronic lung problems (COPD).  Cancer. SYMPTOMS   Cough.  Fever.  Chest pain.  Increased breathing rate.  High-pitched whistling sound when breathing (wheezing).  Colored mucus that you cough up (sputum). TREATMENT   A bacterial cough may be treated with antibiotic medicine.  A viral cough must run its course and will not respond to antibiotics.  Your caregiver may recommend other treatments if you have a chronic cough. HOME CARE INSTRUCTIONS   Only take over-the-counter or prescription medicines for pain, discomfort, or fever as directed by your caregiver. Use cough suppressants only as directed by your caregiver.  Use a cold steam vaporizer or humidifier in your bedroom or home to help loosen secretions.  Sleep in a semi-upright position if your cough is worse at night.  Rest as needed.  Stop smoking if you smoke. SEEK IMMEDIATE MEDICAL CARE IF:   You have pus in your sputum.  Your cough starts to worsen.  You cannot control your cough with suppressants and are losing sleep.  You begin coughing up blood.  You have difficulty breathing.  You develop pain which is getting worse or is uncontrolled with medicine.  You have a fever. MAKE SURE YOU:   Understand these instructions.  Will watch your condition.  Will get help right away if you are not doing well or get  worse. Document Released: 01/17/2011 Document Revised: 10/13/2011 Document Reviewed: 01/17/2011 [facility] Patient Information 2015 [place], [place]. This information is not intended to replace advice given to you by your health care provider. Make sure you discuss any questions you have with your health care provider.

## 2014-05-12 NOTE — ED Provider Notes (Signed)
William Oneill is a 68 y.o. male who presents to Urgent Care today for sneezing and coughing. Started yesterday. No fevers or chills nausea vomiting diarrhea. No significant sore throat. No trouble breathing. Patient has nasal congestion as well.   Past Medical History  Diagnosis Date  . Insomnia    History  Substance Use Topics  . Smoking status: Never Smoker   . Smokeless tobacco: Not on file  . Alcohol Use: No   ROS as above Medications: No current facility-administered medications for this encounter.   Current Outpatient Prescriptions  Medication Sig Dispense Refill  . amoxicillin-clavulanate (AUGMENTIN) 875-125 MG per tablet Take 1 tablet by mouth 2 (two) times daily.  20 tablet  0  . aspirin 81 MG tablet Take 81 mg by mouth daily.      . benzonatate (TESSALON) 200 MG capsule Take 1 capsule (200 mg total) by mouth 3 (three) times daily as needed for cough.  20 capsule  0  . famotidine (PEPCID) 40 MG tablet Take 1 tablet (40 mg total) by mouth daily.  30 tablet  0  . guaiFENesin-codeine 100-10 MG/5ML syrup Take 5 mLs by mouth at bedtime as needed for cough.  120 mL  0  . ipratropium (ATROVENT) 0.06 % nasal spray Place 2 sprays into the nose 3 (three) times daily.  15 mL  0  . ipratropium (ATROVENT) 0.06 % nasal spray Place 2 sprays into both nostrils 4 (four) times daily.  15 mL  1  . multivitamin (THERAGRAN) per tablet Take 1 tablet by mouth daily.      . traZODone (DESYREL) 50 MG tablet Use for sleep.  Start with a 1/2 tablet and increase to a whole tablet after a week as needed  30 tablet  3    Exam:  BP 131/74  Pulse 70  Temp(Src) 98.6 F (37 C) (Oral)  Resp 16  SpO2 97% Gen: Well NAD HEENT: EOMI,  MMM, tympanic membranes bilaterally. Posterior pharynx with mild cobblestoning. Clear nasal discharge present. Lungs: Normal work of breathing. CTABL Heart: RRR no MRG Abd: NABS, Soft. Nondistended, Nontender Exts: Brisk capillary refill, warm and well perfused.   No  results found for this or any previous visit (from the past 24 hour(s)). No results found.  Assessment and Plan: 68 y.o. male with viral URI. Symptomatic management with codeine containing cough medication and Atrovent nasal spray.  Discussed warning signs or symptoms. Please see discharge instructions. Patient expresses understanding.     Gregor Hams, MD 05/12/14 [id number]

## 2014-05-12 NOTE — Telephone Encounter (Signed)
Phoned & [facility] on patient's personal voicemail & left both main & my direct #'s, regarding his routine exam....  Have not sent letter as of yet, am composing & mailing letter today.

## 2014-05-16 ENCOUNTER — Encounter (HOSPITAL_COMMUNITY): Payer: Self-pay | Admitting: Family Medicine

## 2014-05-16 ENCOUNTER — Emergency Department (INDEPENDENT_AMBULATORY_CARE_PROVIDER_SITE_OTHER)
Admission: EM | Admit: 2014-05-16 | Discharge: 2014-05-16 | Disposition: A | Discharge status: Home / Self Care | Payer: Medicare Other | Source: Home / Self Care | Attending: Family Medicine | Admitting: Family Medicine

## 2014-05-16 DIAGNOSIS — B9789 Other viral agents as the cause of diseases classified elsewhere: Principal | ICD-10-CM

## 2014-05-16 DIAGNOSIS — J069 Acute upper respiratory infection, unspecified: Secondary | ICD-10-CM

## 2014-05-16 DIAGNOSIS — S99911A Unspecified injury of right ankle, initial encounter: Secondary | ICD-10-CM

## 2014-05-16 MED ORDER — FLUTICASONE PROPIONATE 50 MCG/ACT NA SUSP: 1.0000 | Freq: Every day | NASAL | Status: DC

## 2014-05-16 NOTE — ED Notes (Signed)
Reports uri symptoms.  States not getting any better.

## 2014-05-16 NOTE — Discharge Instructions (Signed)
You are doing well overall You likely still have a viral infection that twill take time to resolve Please continue the atrovent and cough medicine Please start using 400-600mg  of ibuprofen every 6 hours Start the flonase at night for additional congestion relief Please call if you develop any further symptoms or are not better by Friday Please put antibiotic ointment on your right ankle daily until it starts to heal.  Please call if the redness and soreness gets worse as you may need antibiotics

## 2014-05-16 NOTE — ED Provider Notes (Signed)
CSN: [id number]     Arrival date & time 05/16/14  1143 History   First MD Initiated Contact with Patient 05/16/14 1149     Chief Complaint  Patient presents with  . URI   (Consider location/radiation/quality/duration/timing/severity/associated sxs/prior Treatment) HPI   URI: pt seen here on 10/9 and given nasal atrovent and codeine w/ some benefit.  Symptoms started 5 days ago. Feeling week in general. Associated w/ runny nose and cough. Significant improvement w/ medications as listed above. Not tring any other medications. No new symptoms. Denies fevers, nausea, vomiting, sinus pain or pressure, SOB, CP.   Diarrhea x 1.   Past Medical History  Diagnosis Date  . Insomnia    History reviewed. No pertinent past surgical history. History reviewed. No pertinent family history. History  Substance Use Topics  . Smoking status: Never Smoker   . Smokeless tobacco: Not on file  . Alcohol Use: No    Review of Systems Per HPI with all other pertinent systems negative.   Allergies  Review of patient's allergies indicates no known allergies.  Home Medications   Prior to Admission medications   Medication Sig Start Date End Date Taking? Authorizing Provider  aspirin 81 MG tablet Take 81 mg by mouth daily.   Yes Historical Provider, MD  famotidine (PEPCID) 40 MG tablet Take 1 tablet (40 mg total) by mouth daily. 10/09/13  Yes Kalman Drape, MD  guaiFENesin-codeine 100-10 MG/5ML syrup Take 5 mLs by mouth at bedtime as needed for cough. 05/12/14  Yes Gregor Hams, MD  ipratropium (ATROVENT) 0.06 % nasal spray Place 2 sprays into both nostrils 4 (four) times daily. 05/12/14  Yes Gregor Hams, MD  multivitamin [facility]) per tablet Take 1 tablet by mouth daily.   Yes Historical Provider, MD  traZODone (DESYREL) 50 MG tablet Use for sleep.  Start with a 1/2 tablet and increase to a whole tablet after a week as needed 10/13/13  Yes Gay Filler Copland, MD  amoxicillin-clavulanate (AUGMENTIN)  875-125 MG per tablet Take 1 tablet by mouth 2 (two) times daily. 10/28/13   Roselee Culver, MD  benzonatate (TESSALON) 200 MG capsule Take 1 capsule (200 mg total) by mouth 3 (three) times daily as needed for cough. 10/09/13   Kalman Drape, MD  fluticasone (FLONASE) 50 MCG/ACT nasal spray Place 1-2 sprays into both nostrils at bedtime. 05/16/14   Waldemar Dickens, MD  ipratropium (ATROVENT) 0.06 % nasal spray Place 2 sprays into the nose 3 (three) times daily. 10/15/11 10/14/12  Ryan M Dunn, PA-C   BP 126/70  Pulse 91  Temp(Src) 99.6 F (37.6 C) (Oral)  Resp 16  SpO2 97% Physical Exam  Constitutional: He appears well-developed and well-nourished. No distress.  HENT:  Head: Normocephalic.  Pharyngeal injection and cobblestoning Nasal congestion  Neck: Normal range of motion.  Cardiovascular: Normal rate, normal heart sounds and intact distal pulses.   Pulmonary/Chest: Effort normal. No respiratory distress. He has no wheezes. He has no rales. He exhibits no tenderness.  Abdominal: Soft. He exhibits no distension.  Musculoskeletal: Normal range of motion. He exhibits no edema and no tenderness.  Lymphadenopathy:    He has no cervical adenopathy.  Neurological: He is alert.  Skin: Skin is warm. He is not diaphoretic.  R distal achilles region w/ central scab (1cm) w/ mild surounding erythema. No purulence  Psychiatric: He has a normal mood and affect. His behavior is normal.    ED Course  Procedures (including critical  care time) Labs Review Labs Reviewed - No data to display  Imaging Review No results found.   MDM   1. Viral URI with cough   2. Ankle injury, right, initial encounter    Still likely due to viral infection Pt to start flonase and continue nasal atrovent and cough medicine Improving overall Pt to call if continues to have worsening or persistent symptoms by Friday for possible ABX Ankle injury from rubbing of new shoes as pt not wearing socks. No sign of  cellulitis but at risk. Antibiotic ointment and sterile bandage placed. Pt to change footwear and continue to place ointment and bandage on wound. If worsens may need ABX.   Precautions given and all questions answered  Linna Darner, MD Family Medicine 05/16/2014, 12:21 PM      Waldemar Dickens, MD 05/16/14 1225

## 2014-05-28 ENCOUNTER — Emergency Department (HOSPITAL_COMMUNITY)
Admission: EM | Admit: 2014-05-28 | Discharge: 2014-05-28 | Disposition: A | Discharge status: Home / Self Care | Payer: Medicare Other | Attending: Emergency Medicine | Admitting: Emergency Medicine

## 2014-05-28 ENCOUNTER — Encounter (HOSPITAL_COMMUNITY): Payer: Self-pay | Admitting: Emergency Medicine

## 2014-05-28 ENCOUNTER — Emergency Department (HOSPITAL_COMMUNITY): Payer: Medicare Other

## 2014-05-28 DIAGNOSIS — J40 Bronchitis, not specified as acute or chronic: Secondary | ICD-10-CM | POA: Insufficient documentation

## 2014-05-28 DIAGNOSIS — Z79899 Other long term (current) drug therapy: Secondary | ICD-10-CM | POA: Insufficient documentation

## 2014-05-28 DIAGNOSIS — Z7982 Long term (current) use of aspirin: Secondary | ICD-10-CM | POA: Diagnosis not present

## 2014-05-28 DIAGNOSIS — R05 Cough: Secondary | ICD-10-CM | POA: Diagnosis present

## 2014-05-28 LAB — URINALYSIS, ROUTINE W REFLEX MICROSCOPIC
Bilirubin Urine: NEGATIVE
Glucose, UA: >1000 mg/dL — AB
Hgb urine dipstick: NEGATIVE
KETONES UR: NEGATIVE mg/dL
LEUKOCYTES UA: NEGATIVE
NITRITE: NEGATIVE
PROTEIN: NEGATIVE mg/dL
Specific Gravity, Urine: 1.044 — ABNORMAL HIGH (ref 1.005–1.030)
UROBILINOGEN UA: 1 mg/dL (ref 0.0–1.0)
pH: 5.5 (ref 5.0–8.0)

## 2014-05-28 LAB — URINE MICROSCOPIC-ADD ON

## 2014-05-28 MED ORDER — PREDNISONE 10 MG PO TABS: 20.0000 mg | ORAL_TABLET | Freq: Every day | ORAL | Status: DC

## 2014-05-28 MED ORDER — ALBUTEROL SULFATE (2.5 MG/3ML) 0.083% IN NEBU
5.0000 mg | INHALATION_SOLUTION | Freq: Once | RESPIRATORY_TRACT | Status: AC
  Administered 2014-05-28: 5 mg via RESPIRATORY_TRACT
  Filled 2014-05-28: qty 6

## 2014-05-28 MED ORDER — ALBUTEROL SULFATE HFA 108 (90 BASE) MCG/ACT IN AERS: 1.0000 | INHALATION_SPRAY | Freq: Four times a day (QID) | RESPIRATORY_TRACT | Status: DC | PRN

## 2014-05-28 MED ORDER — PREDNISONE 20 MG PO TABS
60.0000 mg | ORAL_TABLET | Freq: Once | ORAL | Status: AC
  Administered 2014-05-28: 60 mg via ORAL
  Filled 2014-05-28: qty 3

## 2014-05-28 MED ORDER — IPRATROPIUM BROMIDE 0.02 % IN SOLN
0.5000 mg | Freq: Once | RESPIRATORY_TRACT | Status: AC
  Administered 2014-05-28: 0.5 mg via RESPIRATORY_TRACT
  Filled 2014-05-28: qty 2.5

## 2014-05-28 NOTE — ED Notes (Signed)
Pt reports being seen at the [facility] for same x 2 weeks ago.  Not getting better.  Pt reports cough and congestion.  Unable to sleep tonight.  Pt reports body aches and sore throat.

## 2014-05-28 NOTE — ED Provider Notes (Signed)
CSN: [id number]     Arrival date & time 05/28/14  0231 History   First MD Initiated Contact with Patient 05/28/14 [phone]     Chief Complaint  Patient presents with  . Cough  . Nasal Congestion     (Consider location/radiation/quality/duration/timing/severity/associated sxs/prior Treatment) HPI Comments: Patient complains of cough and congestion 2 weeks. Seen in urgent care and treated for URI. Those notes were reviewed. Continues no myalgias without vomiting or diarrhea. Some cephalgia without neck pain or photophobia. No abdominal pain or chest pain. Denies any dysuria or hematuria. No reported fevers at home. Symptoms are worse at night and nothing makes them better. Not responsive to over-the-counter medications.  Patient is a 68 y.o. male presenting with cough. The history is provided by the patient.  Cough   Past Medical History  Diagnosis Date  . Insomnia    History reviewed. No pertinent past surgical history. No family history on file. History  Substance Use Topics  . Smoking status: Never Smoker   . Smokeless tobacco: Not on file  . Alcohol Use: No    Review of Systems  Respiratory: Positive for cough.   All other systems reviewed and are negative.     Allergies  Review of patient's allergies indicates no known allergies.  Home Medications   Prior to Admission medications   Medication Sig Start Date End Date Taking? Authorizing Provider  aspirin 81 MG tablet Take 81 mg by mouth daily.   Yes Historical Provider, MD  ibuprofen (ADVIL,MOTRIN) 200 MG tablet Take 400-600 mg by mouth every 6 (six) hours as needed for moderate pain.   Yes Historical Provider, MD  Multiple Vitamin (MULTIVITAMIN WITH MINERALS) TABS tablet Take 1 tablet by mouth daily.   Yes Historical Provider, MD  ipratropium (ATROVENT) 0.06 % nasal spray Place 2 sprays into the nose 3 (three) times daily. 10/15/11 10/14/12  Ryan M Dunn, PA-C   BP 128/65  Pulse 71  Temp(Src) 98.7 F (37.1 C) (Oral)   Resp 20  SpO2 95% Physical Exam  Nursing note and vitals reviewed. Constitutional: He is oriented to person, place, and time. He appears well-developed and well-nourished.  Non-toxic appearance. No distress.  HENT:  Head: Normocephalic and atraumatic.  Eyes: Conjunctivae, EOM and lids are normal. Pupils are equal, round, and reactive to light.  Neck: Normal range of motion. Neck supple. No tracheal deviation present. No mass present.  Cardiovascular: Normal rate, regular rhythm and normal heart sounds.  Exam reveals no gallop.   No murmur heard. Pulmonary/Chest: Effort normal. No stridor. No respiratory distress. He has decreased breath sounds. He has no wheezes. He has no rhonchi. He has no rales.  Abdominal: Soft. Normal appearance and bowel sounds are normal. He exhibits no distension. There is no tenderness. There is no rebound and no CVA tenderness.  Musculoskeletal: Normal range of motion. He exhibits no edema and no tenderness.  Neurological: He is alert and oriented to person, place, and time. He has normal strength. No cranial nerve deficit or sensory deficit. GCS eye subscore is 4. GCS verbal subscore is 5. GCS motor subscore is 6.  Skin: Skin is warm and dry. No abrasion and no rash noted.  Psychiatric: He has a normal mood and affect. His speech is normal and behavior is normal.    ED Course  Procedures (including critical care time) Labs Review Labs Reviewed  URINE CULTURE  URINALYSIS, ROUTINE W REFLEX MICROSCOPIC    Imaging Review Dg Chest 2 View  05/28/2014  CLINICAL DATA:  Cough and nasal congestion. Previously seen for same symptoms 2 weeks ago. Body aches and sore throat.  EXAM: CHEST  2 VIEW  COMPARISON:  10/08/2013  FINDINGS: The heart size and mediastinal contours are within normal limits. Both lungs are clear. The visualized skeletal structures are unremarkable.  IMPRESSION: No active cardiopulmonary disease.   Electronically Signed   By: Lucienne Capers M.D.    On: 05/28/2014 03:20     EKG Interpretation None      MDM   Final diagnoses:  None    Patient given albuterol and prednisone and feels better. Suspect he has bronchitis. Will treat for that.    Leota Jacobsen, MD 05/28/14 [phone]

## 2014-05-28 NOTE — ED Notes (Signed)
Respiratory called for treatment.

## 2014-05-28 NOTE — ED Notes (Signed)
Pt alert, oriented, and ambulatory upon DC. Pt reports he is leaving with ALL belongings he arrived with. Pt was advised to follow up with PCP.

## 2014-05-28 NOTE — Discharge Instructions (Signed)

## 2014-05-29 LAB — URINE CULTURE
Colony Count: NO GROWTH
Culture: NO GROWTH

## 2014-07-10 ENCOUNTER — Telehealth: Payer: Self-pay | Admitting: Radiology

## 2014-07-10 NOTE — Telephone Encounter (Signed)
Renay has left message for patient to come in to have flu shot

## 2014-08-21 ENCOUNTER — Telehealth: Payer: Self-pay | Admitting: Family Medicine

## 2014-08-21 NOTE — Telephone Encounter (Signed)
Spoke with patient and he has not had the flu shot this year due to illness.  He will try and come by and get it.

## 2014-09-04 ENCOUNTER — Ambulatory Visit (INDEPENDENT_AMBULATORY_CARE_PROVIDER_SITE_OTHER): Payer: Medicare Other | Admitting: Family Medicine

## 2014-09-04 VITALS — BP 132/84 | HR 98 | Temp 98.0°F | Resp 17 | Ht 65.5 in | Wt 182.0 lb

## 2014-09-04 DIAGNOSIS — R32 Unspecified urinary incontinence: Secondary | ICD-10-CM

## 2014-09-04 DIAGNOSIS — R81 Glycosuria: Secondary | ICD-10-CM

## 2014-09-04 DIAGNOSIS — R739 Hyperglycemia, unspecified: Secondary | ICD-10-CM

## 2014-09-04 DIAGNOSIS — E119 Type 2 diabetes mellitus without complications: Secondary | ICD-10-CM

## 2014-09-04 LAB — POCT URINALYSIS DIPSTICK
Bilirubin, UA: NEGATIVE
Blood, UA: NEGATIVE
Glucose, UA: 500
Leukocytes, UA: NEGATIVE
Nitrite, UA: NEGATIVE
Protein, UA: NEGATIVE
Spec Grav, UA: 1.01
Urobilinogen, UA: 4
pH, UA: 6

## 2014-09-04 LAB — POCT UA - MICROSCOPIC ONLY
Bacteria, U Microscopic: NEGATIVE
Casts, Ur, LPF, POC: NEGATIVE
Crystals, Ur, HPF, POC: NEGATIVE
Mucus, UA: NEGATIVE
RBC, urine, microscopic: NEGATIVE
WBC, Ur, HPF, POC: NEGATIVE
Yeast, UA: NEGATIVE

## 2014-09-04 LAB — POCT GLYCOSYLATED HEMOGLOBIN (HGB A1C): Hemoglobin A1C: 9.3

## 2014-09-04 LAB — GLUCOSE, POCT (MANUAL RESULT ENTRY): POC Glucose: 329 mg/dL — AB (ref 70–99)

## 2014-09-04 MED ORDER — METFORMIN HCL 500 MG PO TABS: 500.0000 mg | ORAL_TABLET | Freq: Two times a day (BID) | ORAL | Status: DC

## 2014-09-04 NOTE — Patient Instructions (Signed)
You are a newly diagnosed with diabetes That may explain why ypu have been urianting so much I would like you to take the Metformin one pill daily in the morning and then at night for 3 days, if you are able to tolerate it then increase your medicines on day 4 Day 4 take metformin 2 pills ( 1000 mg ) twice a day You will see me on Friday tos ee if your blood sugar is better and if you are still having incontinence, we can readress it on Friday.   Metformin tablets What is this medicine? METFORMIN (met FOR min) is used to treat type 2 diabetes. It helps to control blood sugar. Treatment is combined with diet and exercise. This medicine can be used alone or with other medicines for diabetes. This medicine may be used for other purposes; ask your health care provider or pharmacist if you have questions. COMMON BRAND NAME(S): Glucophage What should I tell my health care provider before I take this medicine? They need to know if you have any of these conditions: -anemia -frequently drink alcohol-containing beverages -become easily dehydrated -heart attack -heart failure that is treated with medications -kidney disease -liver disease -polycystic ovary syndrome -serious infection or injury -vomiting -an unusual or allergic reaction to metformin, other medicines, foods, dyes, or preservatives -pregnant or trying to get pregnant -breast-feeding How should I use this medicine? Take this medicine by mouth. Take it with meals. Swallow the tablets with a drink of water. Follow the directions on the prescription label. Take your medicine at regular intervals. Do not take your medicine more often than directed. Talk to your pediatrician regarding the use of this medicine in children. While this drug may be prescribed for children as young as 43 years of age for selected conditions, precautions do apply. Overdosage: If you think you have taken too much of this medicine contact a poison control center or  emergency room at once. NOTE: This medicine is only for you. Do not share this medicine with others. What if I miss a dose? If you miss a dose, take it as soon as you can. If it is almost time for your next dose, take only that dose. Do not take double or extra doses. What may interact with this medicine? Do not take this medicine with any of the following medications: -dofetilide -gatifloxacin -certain contrast medicines given before X-rays, CT scans, MRI, or other procedures This medicine may also interact with the following medications: -digoxin -diuretics -male hormones, like estrogens or progestins and birth control pills -isoniazid -medicines for blood pressure, heart disease, irregular heart beat -morphine -nicotinic acid -phenothiazines like chlorpromazine, mesoridazine, prochlorperazine, thioridazine -phenytoin -procainamide -quinidine -quinine -ranitidine -steroid medicines like prednisone or cortisone -stimulant medicines for attention disorders, weight loss, or to stay awake -thyroid medicines -trimethoprim -vancomycin This list may not describe all possible interactions. Give your health care provider a list of all the medicines, herbs, non-prescription drugs, or dietary supplements you use. Also tell them if you smoke, drink alcohol, or use illegal drugs. Some items may interact with your medicine. What should I watch for while using this medicine? Visit your doctor or health care professional for regular checks on your progress. A test called the HbA1C (A1C) will be monitored. This is a simple blood test. It measures your blood sugar control over the last 2 to 3 months. You will receive this test every 3 to 6 months. Learn how to check your blood sugar. Learn the symptoms of low  and high blood sugar and how to manage them. Always carry a quick-source of sugar with you in case you have symptoms of low blood sugar. Examples include hard sugar candy or glucose tablets.  Make sure others know that you can choke if you eat or drink when you develop serious symptoms of low blood sugar, such as seizures or unconsciousness. They must get medical help at once. Tell your doctor or health care professional if you have high blood sugar. You might need to change the dose of your medicine. If you are sick or exercising more than usual, you might need to change the dose of your medicine. Do not skip meals. Ask your doctor or health care professional if you should avoid alcohol. Many nonprescription cough and cold products contain sugar or alcohol. These can affect blood sugar. This medicine may cause ovulation in premenopausal women who do not have regular monthly periods. This may increase your chances of becoming pregnant. You should not take this medicine if you become pregnant or think you may be pregnant. Talk with your doctor or health care professional about your birth control options while taking this medicine. Contact your doctor or health care professional right away if think you are pregnant. If you are going to need surgery, a MRI, CT scan, or other procedure, tell your doctor that you are taking this medicine. You may need to stop taking this medicine before the procedure. Wear a medical ID bracelet or chain, and carry a card that describes your disease and details of your medicine and dosage times. What side effects may I notice from receiving this medicine? Side effects that you should report to your doctor or health care professional as soon as possible: -allergic reactions like skin rash, itching or hives, swelling of the face, lips, or tongue -breathing problems -feeling faint or lightheaded, falls -muscle aches or pains -signs and symptoms of low blood sugar such as feeling anxious, confusion, dizziness, increased hunger, unusually weak or tired, sweating, shakiness, cold, irritable, headache, blurred vision, fast heartbeat, loss of consciousness -slow or  irregular heartbeat -unusual stomach pain or discomfort -unusually tired or weak Side effects that usually do not require medical attention (report to your doctor or health care professional if they continue or are bothersome): -diarrhea -headache -heartburn -metallic taste in mouth -nausea -stomach gas, upset This list may not describe all possible side effects. Call your doctor for medical advice about side effects. You may report side effects to FDA at 1-800-FDA-1088. Where should I keep my medicine? Keep out of the reach of children. Store at room temperature between 15 and 30 degrees C (59 and 86 degrees F). Protect from moisture and light. Throw away any unused medicine after the expiration date. NOTE: This sheet is a summary. It may not cover all possible information. If you have questions about this medicine, talk to your doctor, pharmacist, or health care provider.  2015, Elsevier/Gold Standard. (2012-11-02 16:03:44)   Diabetes and Standards of Medical Care Diabetes is complicated. You may find that your diabetes team includes a dietitian, nurse, diabetes educator, eye doctor, and more. To help everyone know what is going on and to help you get the care you deserve, the following schedule of care was developed to help keep you on track. Below are the tests, exams, vaccines, medicines, education, and plans you will need. HbA1c test This test shows how well you have controlled your glucose over the past 2-3 months. It is used to see if your  diabetes management plan needs to be adjusted.   It is performed at least 2 times a year if you are meeting treatment goals.  It is performed 4 times a year if therapy has changed or if you are not meeting treatment goals. Blood pressure test  This test is performed at every routine medical visit. The goal is less than 140/90 mm Hg for most people, but 130/80 mm Hg in some cases. Ask your health care provider about your goal. Dental  exam  Follow up with the dentist regularly. Eye exam  If you are diagnosed with type 1 diabetes as a child, get an exam upon reaching the age of 77 years or older and have had diabetes for 3-5 years. Yearly eye exams are recommended after that initial eye exam.  If you are diagnosed with type 1 diabetes as an adult, get an exam within 5 years of diagnosis and then yearly.  If you are diagnosed with type 2 diabetes, get an exam as soon as possible after the diagnosis and then yearly. Foot care exam  Visual foot exams are performed at every routine medical visit. The exams check for cuts, injuries, or other problems with the feet.  A comprehensive foot exam should be done yearly. This includes visual inspection as well as assessing foot pulses and testing for loss of sensation.  Check your feet nightly for cuts, injuries, or other problems with your feet. Tell your health care provider if anything is not healing. Kidney function test (urine microalbumin)  This test is performed once a year.  Type 1 diabetes: The first test is performed 5 years after diagnosis.  Type 2 diabetes: The first test is performed at the time of diagnosis.  A serum creatinine and estimated glomerular filtration rate (eGFR) test is done once a year to assess the level of chronic kidney disease (CKD), if present. Lipid profile (cholesterol, HDL, LDL, triglycerides)  Performed every 5 years for most people.  The goal for LDL is less than 100 mg/dL. If you are at high risk, the goal is less than 70 mg/dL.  The goal for HDL is 40 mg/dL-50 mg/dL for men and 50 mg/dL-60 mg/dL for women. An HDL cholesterol of 60 mg/dL or higher gives some protection against heart disease.  The goal for triglycerides is less than 150 mg/dL. Influenza vaccine, pneumococcal vaccine, and hepatitis B vaccine  The influenza vaccine is recommended yearly.  It is recommended that people with diabetes who are over 48 years old get the  pneumonia vaccine. In some cases, two separate shots may be given. Ask your health care provider if your pneumonia vaccination is up to date.  The hepatitis B vaccine is also recommended for adults with diabetes. Diabetes self-management education  Education is recommended at diagnosis and ongoing as needed. Treatment plan  Your treatment plan is reviewed at every medical visit. Document Released: 05/18/2009 Document Revised: 12/05/2013 Document Reviewed: 12/21/2012 [facility] Patient Information 2015 [place], [place]. This information is not intended to replace advice given to you by your health care provider. Make sure you discuss any questions you have with your health care provider.

## 2014-09-04 NOTE — Progress Notes (Signed)
Chief Complaint:  Chief Complaint  Patient presents with  . difficulty holding urination, goes in pants    HPI: KERVIN BONES is a 69 y.o. male who is here for a 1 week hx of  Incontinence, he states it was worse then now is better but his sister and brother in law states he has to go to the doctor to get it checked out. He has had no urinary tract infection, no fevers or chills. He states he has urinary incontinency, no urgency.  No nausea, vomiting, or increase thrist.  No incontinece with coughing or sneezing, no dribbling, his flow is normal, he also states he uriantes about 1-2 times per night.  Past Medical History  Diagnosis Date  . Insomnia    No past surgical history on file. History   Social History  . Marital Status: Married    Spouse Name: N/A    Number of Children: N/A  . Years of Education: N/A   Social History Main Topics  . Smoking status: Never Smoker   . Smokeless tobacco: None  . Alcohol Use: No  . Drug Use: No  . Sexual Activity: None   Other Topics Concern  . None   Social History Narrative   No family history on file. No Known Allergies Prior to Admission medications   Medication Sig Start Date End Date Taking? Authorizing Provider  aspirin 81 MG tablet Take 81 mg by mouth daily.   Yes Historical Provider, MD  Multiple Vitamin (MULTIVITAMIN WITH MINERALS) TABS tablet Take 1 tablet by mouth daily.   Yes Historical Provider, MD  albuterol (PROVENTIL HFA;VENTOLIN HFA) 108 (90 BASE) MCG/ACT inhaler Inhale 1-2 puffs into the lungs every 6 (six) hours as needed for wheezing or shortness of breath. Patient not taking: Reported on 09/04/2014 05/28/14   Leota Jacobsen, MD     ROS: The patient denies fevers, chills, night sweats, unintentional weight loss, chest pain, palpitations, wheezing, dyspnea on exertion, nausea, vomiting, abdominal pain, dysuria, hematuria, melena, numbness, weakness, or tingling.   All other systems have been reviewed and  were otherwise negative with the exception of those mentioned in the HPI and as above.    PHYSICAL EXAM: Filed Vitals:   09/04/14 1505  BP: 132/84  Pulse: 98  Temp: 98 F (36.7 C)  Resp: 17   Filed Vitals:   09/04/14 1505  Height: 5' 5.5" (1.664 m)  Weight: 182 lb (82.555 kg)   Body mass index is 29.82 kg/(m^2).  General: Alert, no acute distress HEENT:  Normocephalic, atraumatic, oropharynx patent. EOMI, PERRLA Cardiovascular:  Regular rate and rhythm, no rubs murmurs or gallops.  No Carotid bruits, radial pulse intact. No pedal edema.  Respiratory: Clear to auscultation bilaterally.  No wheezes, rales, or rhonchi.  No cyanosis, no use of accessory musculature GI: No organomegaly, abdomen is soft and non-tender, positive bowel sounds.  No masses. Skin: No rashes. Neurologic: Facial musculature symmetric. Psychiatric: Patient is appropriate throughout our interaction. Lymphatic: No cervical lymphadenopathy Musculoskeletal: Gait intact. Minimally boggy prostate  Genitals are normal    LABS: Results for orders placed or performed in visit on 09/04/14  POCT urinalysis dipstick  Result Value Ref Range   Color, UA yellow    Clarity, UA clear    Glucose, UA 500    Bilirubin, UA neg    Ketones, UA trace    Spec Grav, UA 1.010    Blood, UA neg    pH, UA 6.0  Protein, UA neg    Urobilinogen, UA 4.0    Nitrite, UA neg    Leukocytes, UA Negative   POCT UA - Microscopic Only  Result Value Ref Range   WBC, Ur, HPF, POC neg    RBC, urine, microscopic neg    Bacteria, U Microscopic neg    Mucus, UA neg    Epithelial cells, urine per micros 0-3    Crystals, Ur, HPF, POC neg    Casts, Ur, LPF, POC neg    Yeast, UA neg   POCT glycosylated hemoglobin (Hb A1C)  Result Value Ref Range   Hemoglobin A1C 9.3   POCT glucose (manual entry)  Result Value Ref Range   POC Glucose 329 (A) 70 - 99 mg/dl     EKG/XRAY:   Primary read interpreted by Dr. Marin Comment at  [facility].   ASSESSMENT/PLAN: Encounter Diagnoses  Name Primary?  . Incontinence Yes  . Glucosuria   . Hyperglycemia    Pleasant 69 y/o male who is here for incontinence also newly dx DM untreated He had glucosuria and also prior blood workbut either was never treated in ED  Or he never came back to our office after he got a letter from Dr Lorelei Pont He is here with a one week hx of increase incontinence  He will be treated for Dm with metformin 500 mg BID then titering up to 1000 mg BID He will see me on Friday for recheck and see if his incontinence is better or about the same. If worse then need to be on oxybutynin in addition to metformin. We willr epeat labs then as well.  Labs Pending: CMP, PSA F/u at 104 on Friday   Gross sideeffects, risk and benefits, and alternatives of medications d/w patient. Patient is aware that all medications have potential sideeffects and we are unable to predict every sideeffect or drug-drug interaction that may occur.  Girl Schissler, [place], DO 09/04/2014 4:31 PM

## 2014-09-05 DIAGNOSIS — E119 Type 2 diabetes mellitus without complications: Secondary | ICD-10-CM | POA: Insufficient documentation

## 2014-09-05 LAB — COMPLETE METABOLIC PANEL WITH GFR
AST: 13 U/L (ref 0–37)
Albumin: 4.2 g/dL (ref 3.5–5.2)
Alkaline Phosphatase: 98 U/L (ref 39–117)
BUN: 15 mg/dL (ref 6–23)
CO2: 26 mEq/L (ref 19–32)
Chloride: 105 mEq/L (ref 96–112)
Creat: 0.84 mg/dL (ref 0.50–1.35)
GFR, Est Non African American: >89 mL/min
Total Bilirubin: 1.5 mg/dL — ABNORMAL HIGH (ref 0.2–1.2)
Total Protein: 6.5 g/dL (ref 6.0–8.3)

## 2014-09-05 LAB — COMPLETE METABOLIC PANEL WITHOUT GFR
ALT: 13 U/L (ref 0–53)
Calcium: 9.2 mg/dL (ref 8.4–10.5)
GFR, Est African American: >89 mL/min
Glucose, Bld: 316 mg/dL — ABNORMAL HIGH (ref 70–99)
Potassium: 4.5 meq/L (ref 3.5–5.3)
Sodium: 141 meq/L (ref 135–145)

## 2014-09-05 LAB — PSA: PSA: 1.63 ng/mL (ref <=4.00)

## 2014-09-06 ENCOUNTER — Telehealth: Payer: Self-pay

## 2014-09-06 NOTE — Telephone Encounter (Signed)
Dr. Marin Comment, William Oneill was not sent for this pt's CBC. Would you like for me to have them RTC for a redraw?

## 2014-09-08 NOTE — Telephone Encounter (Signed)
No

## 2014-09-11 ENCOUNTER — Ambulatory Visit (INDEPENDENT_AMBULATORY_CARE_PROVIDER_SITE_OTHER): Payer: Medicare Other | Admitting: Family Medicine

## 2014-09-11 VITALS — BP 136/72 | HR 60 | Temp 97.7°F | Resp 16 | Ht 66.75 in | Wt 186.0 lb

## 2014-09-11 DIAGNOSIS — Z2821 Immunization not carried out because of patient refusal: Secondary | ICD-10-CM

## 2014-09-11 DIAGNOSIS — E119 Type 2 diabetes mellitus without complications: Secondary | ICD-10-CM

## 2014-09-11 DIAGNOSIS — G4700 Insomnia, unspecified: Secondary | ICD-10-CM

## 2014-09-11 LAB — COMPLETE METABOLIC PANEL WITH GFR
ALT: 14 U/L (ref 0–53)
Albumin: 4.2 g/dL (ref 3.5–5.2)
BUN: 18 mg/dL (ref 6–23)
CO2: 26 mEq/L (ref 19–32)
Calcium: 8.9 mg/dL (ref 8.4–10.5)
Chloride: 104 mEq/L (ref 96–112)
Glucose, Bld: 172 mg/dL — ABNORMAL HIGH (ref 70–99)
Potassium: 4.6 mEq/L (ref 3.5–5.3)
Total Bilirubin: 1.3 mg/dL — ABNORMAL HIGH (ref 0.2–1.2)
Total Protein: 6.3 g/dL (ref 6.0–8.3)

## 2014-09-11 LAB — GLUCOSE, POCT (MANUAL RESULT ENTRY): POC Glucose: 162 mg/dL — AB (ref 70–99)

## 2014-09-11 LAB — COMPLETE METABOLIC PANEL WITHOUT GFR
AST: 14 U/L (ref 0–37)
Alkaline Phosphatase: 108 U/L (ref 39–117)
Creat: 0.7 mg/dL (ref 0.50–1.35)
GFR, Est African American: >89 mL/min
GFR, Est Non African American: >89 mL/min
Sodium: 138 meq/L (ref 135–145)

## 2014-09-11 NOTE — Progress Notes (Signed)
 Chief Complaint:  Chief Complaint  Patient presents with  . Follow-up    Diabetes  . Diabetes    Says he is doing good; been working on positive changes    HPI: William Oneill is a 69 y.o. male who is here for DM f/u  He was having icnrease urineary frequency and was evaluated and was found to be diabetic, poorly controlled He has been on metformin for 1 week now and is at 2 pills 500 mg BID without SEs. No Hypoglycemia, no n/v/abd pain, had 1 episode of diarrhea no flatulence  BP Readings from Last 3 Encounters:  09/11/14 136/72  09/04/14 132/84  05/28/14 134/71    Lab Results  Component Value Date   HGBA1C 9.3 09/04/2014   Lab Results  Component Value Date   CREATININE 0.84 09/04/2014       Past Medical History  Diagnosis Date  . Insomnia   . Diabetes mellitus without complication    History reviewed. No pertinent past surgical history. History   Social History  . Marital Status: Married    Spouse Name: N/A    Number of Children: N/A  . Years of Education: N/A   Social History Main Topics  . Smoking status: Never Smoker   . Smokeless tobacco: None  . Alcohol Use: No  . Drug Use: No  . Sexual Activity: None   Other Topics Concern  . None   Social History Narrative   History reviewed. No pertinent family history. No Known Allergies Prior to Admission medications   Medication Sig Start Date End Date Taking? Authorizing Provider  aspirin 81 MG tablet Take 81 mg by mouth daily.   Yes Historical Provider, MD  metFORMIN (GLUCOPHAGE) 500 MG tablet Take 1 tablet (500 mg total) by mouth 2 (two) times daily with a meal. 09/04/14  Yes  P , DO  Multiple Vitamin (MULTIVITAMIN WITH MINERALS) TABS tablet Take 1 tablet by mouth daily.   Yes Historical Provider, MD  albuterol (PROVENTIL HFA;VENTOLIN HFA) 108 (90 BASE) MCG/ACT inhaler Inhale 1-2 puffs into the lungs every 6 (six) hours as needed for wheezing or shortness of breath. Patient not taking:  Reported on 09/04/2014 05/28/14   ota Jacobsen, MD     ROS: The patient denies fevers, chills, night sweats, unintentional weight loss, chest pain, palpitations, wheezing, dyspnea on exertion, nausea, vomiting, abdominal pain, dysuria, hematuria, melena, numbness, weakness, or tingling.   All other systems have been reviewed and were otherwise negative with the exception of those mentioned in the HPI and as above.    PHYSICAL EXAM: Filed Vitals:   09/11/14 0820  BP: 136/72  Pulse: 60  Temp: 97.7 F (36.5 C)  Resp: 16   Filed Vitals:   09/11/14 0820  Height: 5' 6.75" (1.695 m)  Weight: 186 lb (84.369 kg)   Body mass index is 29.37 kg/(m^2).  General: Alert, no acute distress HEENT:  Normocephalic, atraumatic, oropharynx patent. EOMI, PERRLA Cardiovascular:  Regular rate and rhythm, no rubs , murmurs or gallops. Radial pulse intact. No pedal edema.  Respiratory: Clear to auscultation bilaterally.  No wheezes, rales, or rhonchi.  No cyanosis, no use of accessory musculature GI: No organomegaly, abdomen is soft and non-tender, positive bowel sounds.  No masses. Skin: No open sores, he has onychomycosis Neurologic: Facial musculature symmetric. Normal microfilament exam Psychiatric: Patient is appropriate throughout our interaction. Lymphatic: No cervical lymphadenopathy Musculoskeletal: Gait intact.   LABS: Results for orders placed or performed  in visit on 09/04/14  COMPTE METABOLIC PANEL WITH GFR  Result Value Ref Range   Sodium 141 135 - 145 mEq/L   Potassium 4.5 3.5 - 5.3 mEq/L   Chloride 105 96 - 112 mEq/L   CO2 26 19 - 32 mEq/L   Glucose, Bld 316 (H) 70 - 99 mg/dL   BUN 15 6 - 23 mg/dL   Creat 0.84 0.50 - 1.35 mg/dL   Total Bilirubin 1.5 (H) 0.2 - 1.2 mg/dL   Alkaline Phosphatase 98 39 - 117 U/L   AST 13 0 - 37 U/L   ALT 13 0 - 53 U/L   Total Protein 6.5 6.0 - 8.3 g/dL   Albumin 4.2 3.5 - 5.2 g/dL   Calcium 9.2 8.4 - 10.5 mg/dL   GFR, Est African American  >89 mL/min   GFR, Est Non African American >89 mL/min  PSA  Result Value Ref Range   PSA 1.63 <=4.00 ng/mL  POCT urinalysis dipstick  Result Value Ref Range   Color, UA yellow    Clarity, UA clear    Glucose, UA 500    Bilirubin, UA neg    Ketones, UA trace    Spec Grav, UA 1.010    Blood, UA neg    pH, UA 6.0    Protein, UA neg    Urobilinogen, UA 4.0    Nitrite, UA neg    Leukocytes, UA Negative   POCT UA - Microscopic Only  Result Value Ref Range   WBC, Ur, HPF, POC neg    RBC, urine, microscopic neg    Bacteria, U Microscopic neg    Mucus, UA neg    Epithelial cells, urine per micros 0-3    Crystals, Ur, HPF, POC neg    Casts, Ur, LPF, POC neg    Yeast, UA neg   POCT glycosylated hemoglobin (Hb A1C)  Result Value Ref Range   Hemoglobin A1C 9.3   POCT glucose (manual entry)  Result Value Ref Range   POC Glucose 329 (A) 70 - 99 mg/dl     EKG/XRAY:   Primary read interpreted by Dr. Marin Comment at [facility].   ASSESSMENT/PLAN: Encounter Diagnoses  Name Primary?  . Type 2 diabetes mellitus without complication Yes  . Influenza vaccination declined   . Pneumococcal vaccination declined   . Insomnia    Melatonin for insomnia Recommend : ADA diet, BP goal <140/90, daily foot exams, tobacco cessation if smoking, annual eye exam, annual flu vaccine, PNA vaccine if age and time appropriate.  He is UTD on eye exam, sees DR Katy Fitch F/u in 3-4 months Labs pending  Gross sideeffects, risk and benefits, and alternatives of medications d/w patient. Patient is aware that all medications have potential sideeffects and we are unable to predict every sideeffect or drug-drug interaction that may occur.  , [place], DO 09/11/2014 8:51 AM

## 2014-09-11 NOTE — Patient Instructions (Signed)
Recommend : ADA diet, BP goal <140/90, daily foot exams, tobacco cessation if smoking, annual eye exam, annual flu vaccine, PNA vaccine if age and time appropriate.   Melatonin oral capsules and tablets What is this medicine? MELATONIN (mel uh TOH nin) is a dietary supplement. It is promoted to help maintain normal sleep patterns. The FDA has not approved this supplement for any medical use. This supplement may be used for other purposes; ask your health care provider or pharmacist if you have questions. This medicine may be used for other purposes; ask your health care provider or pharmacist if you have questions. COMMON BRAND NAME(S): Melatonex What should I tell my health care provider before I take this medicine? They need to know if you have any of these conditions: -cancer -if you frequently drink alcohol containing drinks -immune system problems -liver disease -seizure disorder -an unusual or allergic reaction to melatonin, other medicines, foods, dyes, or preservatives -pregnant or trying to get pregnant -breast-feeding How should I use this medicine? Take this supplement by mouth with a glass of water. This supplement is usually taken prior to bedtime. Do not chew or crush most tablets or capsules. Some tablets are chewable and are chewed before swallowing. Some tablets are meant to be dissolved in the mouth or under the tongue. Follow the directions on the package labeling, or take as directed by your health care professional. Do not take this supplement more often than directed. Talk to your pediatrician regarding the use of this supplement in children. This supplement is not recommended for use in children. Overdosage: If you think you have taken too much of this medicine contact a poison control center or emergency room at once. NOTE: This medicine is only for you. Do not share this medicine with others. What if I miss a dose? This does not apply; this medicine is not for regular  use. Do not take double or extra doses. What may interact with this medicine? Check with your doctor or healthcare professional if you are taking any of the following medications: -hormone medicines -medicines for blood pressure like nifedipine -medications for anxiety, depression, or other emotional or psychiatric problems -medications for seizures -medications for sleep -other herbal or dietary supplements -tamoxifen -treatments for cancer or immune disorders This list may not describe all possible interactions. Give your health care provider a list of all the medicines, herbs, non-prescription drugs, or dietary supplements you use. Also tell them if you smoke, drink alcohol, or use illegal drugs. Some items may interact with your medicine. What should I watch for while using this medicine? See your doctor if your symptoms do not get better or if they get worse. Do not take this supplement for more than 2 weeks unless your doctor tells you to. You may get drowsy or dizzy. Do not drive, use machinery, or do anything that needs mental alertness until you know how this medicine affects you. Do not stand or sit up quickly, especially if you are an older patient. This reduces the risk of dizzy or fainting spells. Alcohol may interfere with the effect of this medicine. Avoid alcoholic drinks. Talk to your doctor before you use this supplement if you are currently being treated for an emotional, mental, or sleep problem. This medicine may interfere with your treatment. Herbal or dietary supplements are not regulated like medicines. Rigid quality control standards are not required for dietary supplements. The purity and strength of these products can vary. The safety and effect of this dietary  supplement for a certain disease or illness is not well known. This product is not intended to diagnose, treat, cure or prevent any disease. The Food and Drug Administration suggests the following to help consumers  protect themselves: -Always read product labels and follow directions. -Natural does not mean a product is safe for humans to take. -Look for products that include USP after the ingredient name. This means that the manufacturer followed the standards of the [place] Pharmacopoeia. -Supplements made or sold by a nationally known food or drug company are more likely to be made under tight controls. You can write to the company for more information about how the product was made. What side effects may I notice from receiving this medicine? Side effects that you should report to your doctor or health care professional as soon as possible: -allergic reactions like skin rash, itching or hives, swelling of the face, lips, or tongue -breathing problems -confusion, forgetful -depressed, nervous, or other mood changes -fast or pounding heartbeat -trouble staying awake or alert during the day Side effects that usually do not require medical attention (report to your doctor or health care professional if they continue or are bothersome): -drowsiness, dizziness -headache -nightmares -upset stomach This list may not describe all possible side effects. Call your doctor for medical advice about side effects. You may report side effects to FDA at 1-800-FDA-1088. Where should I keep my medicine? Keep out of the reach of children. Store at room temperature or as directed on the package label. Protect from moisture. Throw away any unused supplement after the expiration date. NOTE: This sheet is a summary. It may not cover all possible information. If you have questions about this medicine, talk to your doctor, pharmacist, or health care provider.  2015, Elsevier/Gold Standard. (2013-06-03 18:36:27)   Diabetes Mellitus and Food It is important for you to manage your blood sugar (glucose) level. Your blood glucose level can be greatly affected by what you eat. Eating healthier foods in the appropriate amounts  throughout the day at about the same time each day will help you control your blood glucose level. It can also help slow or prevent worsening of your diabetes mellitus. Healthy eating may even help you improve the level of your blood pressure and reach or maintain a healthy weight.  HOW CAN FOOD AFFECT ME? Carbohydrates Carbohydrates affect your blood glucose level more than any other type of food. Your dietitian will help you determine how many carbohydrates to eat at each meal and teach you how to count carbohydrates. Counting carbohydrates is important to keep your blood glucose at a healthy level, especially if you are using insulin or taking certain medicines for diabetes mellitus. Alcohol Alcohol can cause sudden decreases in blood glucose (hypoglycemia), especially if you use insulin or take certain medicines for diabetes mellitus. Hypoglycemia can be a life-threatening condition. Symptoms of hypoglycemia (sleepiness, dizziness, and disorientation) are similar to symptoms of having too much alcohol.  If your health care provider has given you approval to drink alcohol, do so in moderation and use the following guidelines:  Women should not have more than one drink per day, and men should not have more than two drinks per day. One drink is equal to:  12 oz of beer.  5 oz of wine.  1 oz of hard liquor.  Do not drink on an empty stomach.  Keep yourself hydrated. Have water, diet soda, or unsweetened iced tea.  Regular soda, juice, and other mixers might contain a  lot of carbohydrates and should be counted. WHAT FOODS ARE NOT RECOMMENDED? As you make food choices, it is important to remember that all foods are not the same. Some foods have fewer nutrients per serving than other foods, even though they might have the same number of calories or carbohydrates. It is difficult to get your body what it needs when you eat foods with fewer nutrients. Examples of foods that you should avoid that  are high in calories and carbohydrates but low in nutrients include:  Trans fats (most processed foods list trans fats on the Nutrition Facts label).  Regular soda.  Juice.  Candy.  Sweets, such as cake, pie, doughnuts, and cookies.  Fried foods. WHAT FOODS CAN I EAT? Have nutrient-rich foods, which will nourish your body and keep you healthy. The food you should eat also will depend on several factors, including:  The calories you need.  The medicines you take.  Your weight.  Your blood glucose level.  Your blood pressure level.  Your cholesterol level. You also should eat a variety of foods, including:  Protein, such as meat, poultry, fish, tofu, nuts, and seeds (lean animal proteins are best).  Fruits.  Vegetables.  Dairy products, such as milk, cheese, and yogurt (low fat is best).  Breads, grains, pasta, cereal, rice, and beans.  Fats such as olive oil, trans fat-free margarine, canola oil, avocado, and olives. DOES EVERYONE WITH DIABETES MELLITUS HAVE THE SAME MEAL PLAN? Because every person with diabetes mellitus is different, there is not one meal plan that works for everyone. It is very important that you meet with a dietitian who will help you create a meal plan that is just right for you. Document Released: 04/17/2005 Document Revised: 07/26/2013 Document Reviewed: 06/17/2013 [facility] Patient Information 2015 [place], [place]. This information is not intended to replace advice given to you by your health care provider. Make sure you discuss any questions you have with your health care provider. Diabetes and Standards of Medical Care Diabetes is complicated. You may find that your diabetes team includes a dietitian, nurse, diabetes educator, eye doctor, and more. To help everyone know what is going on and to help you get the care you deserve, the following schedule of care was developed to help keep you on track. Below are the tests, exams, vaccines, medicines,  education, and plans you will need. HbA1c test This test shows how well you have controlled your glucose over the past 2-3 months. It is used to see if your diabetes management plan needs to be adjusted.   It is performed at least 2 times a year if you are meeting treatment goals.  It is performed 4 times a year if therapy has changed or if you are not meeting treatment goals. Blood pressure test  This test is performed at every routine medical visit. The goal is less than 140/90 mm Hg for most people, but 130/80 mm Hg in some cases. Ask your health care provider about your goal. Dental exam  Follow up with the dentist regularly. Eye exam  If you are diagnosed with type 1 diabetes as a child, get an exam upon reaching the age of 31 years or older and have had diabetes for 3-5 years. Yearly eye exams are recommended after that initial eye exam.  If you are diagnosed with type 1 diabetes as an adult, get an exam within 5 years of diagnosis and then yearly.  If you are diagnosed with type 2 diabetes, get an  exam as soon as possible after the diagnosis and then yearly. Foot care exam  Visual foot exams are performed at every routine medical visit. The exams check for cuts, injuries, or other problems with the feet.  A comprehensive foot exam should be done yearly. This includes visual inspection as well as assessing foot pulses and testing for loss of sensation.  Check your feet nightly for cuts, injuries, or other problems with your feet. Tell your health care provider if anything is not healing. Kidney function test (urine microalbumin)  This test is performed once a year.  Type 1 diabetes: The first test is performed 5 years after diagnosis.  Type 2 diabetes: The first test is performed at the time of diagnosis.  A serum creatinine and estimated glomerular filtration rate (eGFR) test is done once a year to assess the level of chronic kidney disease (CKD), if present. Lipid profile  (cholesterol, HDL, LDL, triglycerides)  Performed every 5 years for most people.  The goal for LDL is less than 100 mg/dL. If you are at high risk, the goal is less than 70 mg/dL.  The goal for HDL is 40 mg/dL-50 mg/dL for men and 50 mg/dL-60 mg/dL for women. An HDL cholesterol of 60 mg/dL or higher gives some protection against heart disease.  The goal for triglycerides is less than 150 mg/dL. Influenza vaccine, pneumococcal vaccine, and hepatitis B vaccine  The influenza vaccine is recommended yearly.  It is recommended that people with diabetes who are over 59 years old get the pneumonia vaccine. In some cases, two separate shots may be given. Ask your health care provider if your pneumonia vaccination is up to date.  The hepatitis B vaccine is also recommended for adults with diabetes. Diabetes self-management education  Education is recommended at diagnosis and ongoing as needed. Treatment plan  Your treatment plan is reviewed at every medical visit. Document Released: 05/18/2009 Document Revised: 12/05/2013 Document Reviewed: 12/21/2012 [facility] Patient Information 2015 [place], [place]. This information is not intended to replace advice given to you by your health care provider. Make sure you discuss any questions you have with your health care provider.

## 2014-11-25 ENCOUNTER — Other Ambulatory Visit: Payer: Self-pay | Admitting: Family Medicine

## 2014-11-27 NOTE — Telephone Encounter (Signed)
Dr Marin Comment, it looks like you advised pt to take Melatonin at 09/11/14 OV, and trazodone was not on med list at end of OV. Do you want to deny this, or OK to RF?

## 2014-12-11 ENCOUNTER — Ambulatory Visit (INDEPENDENT_AMBULATORY_CARE_PROVIDER_SITE_OTHER): Payer: Medicare Other | Admitting: Family Medicine

## 2014-12-11 ENCOUNTER — Encounter: Payer: Self-pay | Admitting: Family Medicine

## 2014-12-11 VITALS — BP 128/60 | HR 61 | Temp 97.6°F | Resp 16 | Ht 65.8 in | Wt 187.2 lb

## 2014-12-11 DIAGNOSIS — G4700 Insomnia, unspecified: Secondary | ICD-10-CM

## 2014-12-11 DIAGNOSIS — E119 Type 2 diabetes mellitus without complications: Secondary | ICD-10-CM

## 2014-12-11 LAB — GLUCOSE, POCT (MANUAL RESULT ENTRY): POC Glucose: 145 mg/dl — AB (ref 70–99)

## 2014-12-11 LAB — POCT GLYCOSYLATED HEMOGLOBIN (HGB A1C): Hemoglobin A1C: 7.7

## 2014-12-11 MED ORDER — BLOOD GLUCOSE METER KIT: PACK | Status: AC

## 2014-12-11 NOTE — Progress Notes (Addendum)
Subjective:  This chart was scribed for William Ray, MD by [facility], medical scribe at [facility].The patient was seen in exam room 24 and the patient's care was started at 1:26 PM.  Authored by William Forehand, MD - unable to change in [facility].    Patient ID: William Oneill, male    DOB: 1946/03/02, 69 y.o.   MRN: [id number] Chief Complaint  Patient presents with   3 month follow up diabetes   HPI  HPI Comments: William Oneill is a 69 y.o. male who presents to [facility] for a three month follow up for diabetes. Last ate just prior to coming in.  Diabetes: He was last seen Feb 8th by Dr. Marin Oneill when he was first diagnosed with diabetes. His  A1C at that visit 9.3 and blood glucose in office was 329. However he had only been on metformin 100 mg twice daily for one week at that time. was recommended  he adhere to ADA diet. daily foot exam and other health maintenance per last note. He does not check his blood sugar at home. He denies have any side effects from the metformin. Pt is taking an 81 mg aspirin daily. No numbness or tingling in the hands or feet. Saw optho last December(prior to diagnosis of DM).  Colon Cancer:  No colonoscopy, he differs a colonoscopy at this visit.   Insomnia:  The trazodone he takes has not been helpful. Some nights he wakes up in the middle of the night. This occurs maybe once or twice in a week. He is unsure if he snores. He denies shortness of breath when he wakes up.  Patient Active Problem List   Diagnosis Date Noted   Type 2 diabetes mellitus without complication 22/44/9753   Insomnia    Past Medical History  Diagnosis Date   Insomnia    Diabetes mellitus without complication    History reviewed. No pertinent past surgical history. No Known Allergies Prior to Admission medications   Medication Sig Start Date End Date Taking? Authorizing Provider  aspirin 81 MG tablet Take 81 mg by mouth daily.   Yes  Historical Provider, MD  metFORMIN (GLUCOPHAGE) 500 MG tablet Take 1 tablet (500 mg total) by mouth 2 (two) times daily with a meal. 09/04/14  Yes William P Le, DO  Multiple Vitamin (MULTIVITAMIN WITH MINERALS) TABS tablet Take 1 tablet by mouth daily.   Yes Historical Provider, MD  traZODone (DESYREL) 50 MG tablet START WITH A 1/2 TABLET BY MOUTH AND INCREASE TO 1 TABLET AFTER A WEEK FOR SLEEP 11/28/14  Yes William Janalee Dane, PA-C   History   Social History   Marital Status: Married    Spouse Name: N/A   Number of Children: N/A   Years of Education: N/A   Occupational History   Not on file.   Social History Main Topics   Smoking status: Never Smoker    Smokeless tobacco: Not on file   Alcohol Use: No   Drug Use: No   Sexual Activity: Not on file   Other Topics Concern   Not on file   Social History Narrative   Review of Systems  Constitutional: Negative for fatigue and unexpected weight change.  Eyes: Negative for visual disturbance.  Respiratory: Negative for cough, chest tightness and shortness of breath.   Cardiovascular: Negative for chest pain, palpitations and leg swelling.  Gastrointestinal: Negative for abdominal pain and blood in stool.  Neurological: Negative for dizziness, light-headedness and headaches.      Objective:  BP 128/60 mmHg   Pulse 61   Temp(Src) 97.6 F (36.4 C) (Oral)   Resp 16   Ht 5' 5.8" (1.671 m)   Wt 187 lb 3.2 oz (84.913 kg)   BMI 30.41 kg/m2   SpO2 97% Physical Exam  Constitutional: He is oriented to person, place, and time. He appears well-developed and well-nourished.  HENT:  Head: Normocephalic and atraumatic.  Eyes: EOM are normal. Pupils are equal, round, and reactive to light.  Neck: No JVD present. Carotid bruit is not present.  Cardiovascular: Normal rate, regular rhythm and normal heart sounds.   No murmur heard. Pulmonary/Chest: Effort normal and breath sounds normal. He has no rales.  Musculoskeletal: He exhibits no  edema.  Neurological: He is alert and oriented to person, place, and time.  Skin: Skin is warm and dry.  Psychiatric: He has a normal mood and affect.  Vitals reviewed.  Diabetic Foot Exam - Simple   Simple Foot Form  Diabetic Foot exam was performed with the following findings:  Yes 12/11/2014  1:10 PM  Visual Inspection  Sensation Testing  Pulse Check  Comments     Results for orders placed or performed in visit on 12/11/14  POCT glycosylated hemoglobin (Hb A1C)  Result Value Ref Range   Hemoglobin A1C 7.7   POCT glucose (manual entry)  Result Value Ref Range   POC Glucose 145 (A) 70 - 99 mg/dl       Assessment & Plan:   William Oneill is a 69 y.o. male Type 2 diabetes mellitus without complication - Plan: [facility] Diabetes Foot Exam, blood glucose meter kit and supplies, Ambulatory referral to diabetic education, POCT glycosylated hemoglobin (Hb A1C), POCT glucose (manual entry), Microalbumin, urine, COMPLETE METABOLIC PANEL WITH GFR, Lipid panel  -much improved DM control.   -glucose meter rx for home readings.   -refer to DM/Nutrition [facility] center for classes.   -cont same dose metformin for now.   -fasting lab visit in next week or two.   -recheck in 3 months.   Insomnia - Plan: Ambulatory referral to Sleep Studies  -recommended initial sleep specialist eval to determine if sleep study indicated with middle of night wakening. Has trazodone for sleep for now.     Meds ordered this encounter  Medications   blood glucose meter kit and supplies    Sig: Dispense based on patient and insurance preference. Use up to four times daily as directed. (FOR ICD-9 250.00, 250.01).    Dispense:  1 each    Refill:  0    Order Specific Question:  Number of strips    Answer:  30    Order Specific Question:  Number of lancets    Answer:  30   Patient Instructions  Return in next week for fasting labs only (nothing to eat after midnight and have blood drawn first thing in the morning).    I will refer you to nutrition and diabetes classes.  I prescribed a meter to check your blood sugar. Keep a record of your blood sugars outside of the office and bring them to the next office visit. I also referred you to sleep specialist to evaluate insomnia further.   If you change your mind about the colonoscopy (colon cancer screening) or screening for blood in the stool - let me know.    Return to the clinic or go to the nearest emergency room  if any of your symptoms worsen or new symptoms occur.  Insomnia Insomnia is frequent trouble falling and/or staying asleep. Insomnia can be a long term problem or a short term problem. Both are common. Insomnia can be a short term problem when the wakefulness is related to a certain stress or worry. Long term insomnia is often related to ongoing stress during waking hours and/or poor sleeping habits. Overtime, sleep deprivation itself can make the problem worse. Every little thing feels more severe because you are overtired and your ability to cope is decreased. CAUSES   Stress, anxiety, and depression.  Poor sleeping habits.  Distractions such as TV in the bedroom.  Naps close to bedtime.  Engaging in emotionally charged conversations before bed.  Technical reading before sleep.  Alcohol and other sedatives. They may make the problem worse. They can hurt normal sleep patterns and normal dream activity.  Stimulants such as caffeine for several hours prior to bedtime.  Pain syndromes and shortness of breath can cause insomnia.  Exercise late at night.  Changing time zones may cause sleeping problems (jet lag). It is sometimes helpful to have someone observe your sleeping patterns. They should look for periods of not breathing during the night (sleep apnea). They should also look to see how long those periods last. If you live alone or observers are uncertain, you can also be observed at a sleep clinic where your sleep patterns will be  professionally monitored. Sleep apnea requires a checkup and treatment. Give your caregivers your medical history. Give your caregivers observations your family has made about your sleep.  SYMPTOMS   Not feeling rested in the morning.  Anxiety and restlessness at bedtime.  Difficulty falling and staying asleep. TREATMENT   Your caregiver may prescribe treatment for an underlying medical disorders. Your caregiver can give advice or help if you are using alcohol or other drugs for self-medication. Treatment of underlying problems will usually eliminate insomnia problems.  Medications can be prescribed for short time use. They are generally not recommended for lengthy use.  Over-the-counter sleep medicines are not recommended for lengthy use. They can be habit forming.  You can promote easier sleeping by making lifestyle changes such as:  Using relaxation techniques that help with breathing and reduce muscle tension.  Exercising earlier in the day.  Changing your diet and the time of your last meal. No night time snacks.  Establish a regular time to go to bed.  Counseling can help with stressful problems and worry.  Soothing music and white noise may be helpful if there are background noises you cannot remove.  Stop tedious detailed work at least one hour before bedtime. HOME CARE INSTRUCTIONS   Keep a diary. Inform your caregiver about your progress. This includes any medication side effects. See your caregiver regularly. Take note of:  Times when you are asleep.  Times when you are awake during the night.  The quality of your sleep.  How you feel the next day. This information will help your caregiver care for you.  Get out of bed if you are still awake after 15 minutes. Read or do some quiet activity. Keep the lights down. Wait until you feel sleepy and go back to bed.  Keep regular sleeping and waking hours. Avoid naps.  Exercise regularly.  Avoid distractions at  bedtime. Distractions include watching television or engaging in any intense or detailed activity like attempting to balance the household checkbook.  Develop a bedtime ritual. Keep a familiar  routine of bathing, brushing your teeth, climbing into bed at the same time each night, listening to soothing music. Routines increase the success of falling to sleep faster.  Use relaxation techniques. This can be using breathing and muscle tension release routines. It can also include visualizing peaceful scenes. You can also help control troubling or intruding thoughts by keeping your mind occupied with boring or repetitive thoughts like the old concept of counting sheep. You can make it more creative like imagining planting one beautiful flower after another in your backyard garden.  During your day, work to eliminate stress. When this is not possible use some of the previous suggestions to help reduce the anxiety that accompanies stressful situations. MAKE SURE YOU:   Understand these instructions.  Will watch your condition.  Will get help right away if you are not doing well or get worse. Document Released: 07/18/2000 Document Revised: 10/13/2011 Document Reviewed: 08/18/2007 [facility] Patient Information 2015 [place], [place]. This information is not intended to replace advice given to you by your health care provider. Make sure you discuss any questions you have with your health care provider.       I personally performed the services described in this documentation, which was scribed in my presence. The recorded information has been reviewed and considered, and addended by me as needed.

## 2014-12-11 NOTE — Patient Instructions (Addendum)
Return in next week for fasting labs only (nothing to eat after midnight and have blood drawn first thing in the morning).   I will refer you to nutrition and diabetes classes.  I prescribed a meter to check your blood sugar. Keep a record of your blood sugars outside of the office and bring them to the next office visit. I also referred you to sleep specialist to evaluate insomnia further.   If you change your mind about the colonoscopy (colon cancer screening) or screening for blood in the stool - let me know.    Return to the clinic or go to the nearest emergency room if any of your symptoms worsen or new symptoms occur.  Insomnia Insomnia is frequent trouble falling and/or staying asleep. Insomnia can be a long term problem or a short term problem. Both are common. Insomnia can be a short term problem when the wakefulness is related to a certain stress or worry. Long term insomnia is often related to ongoing stress during waking hours and/or poor sleeping habits. Overtime, sleep deprivation itself can make the problem worse. Every little thing feels more severe because you are overtired and your ability to cope is decreased. CAUSES   Stress, anxiety, and depression.  Poor sleeping habits.  Distractions such as TV in the bedroom.  Naps close to bedtime.  Engaging in emotionally charged conversations before bed.  Technical reading before sleep.  Alcohol and other sedatives. They may make the problem worse. They can hurt normal sleep patterns and normal dream activity.  Stimulants such as caffeine for several hours prior to bedtime.  Pain syndromes and shortness of breath can cause insomnia.  Exercise late at night.  Changing time zones may cause sleeping problems (jet lag). It is sometimes helpful to have someone observe your sleeping patterns. They should look for periods of not breathing during the night (sleep apnea). They should also look to see how long those periods last.  If you live alone or observers are uncertain, you can also be observed at a sleep clinic where your sleep patterns will be professionally monitored. Sleep apnea requires a checkup and treatment. Give your caregivers your medical history. Give your caregivers observations your family has made about your sleep.  SYMPTOMS   Not feeling rested in the morning.  Anxiety and restlessness at bedtime.  Difficulty falling and staying asleep. TREATMENT   Your caregiver may prescribe treatment for an underlying medical disorders. Your caregiver can give advice or help if you are using alcohol or other drugs for self-medication. Treatment of underlying problems will usually eliminate insomnia problems.  Medications can be prescribed for short time use. They are generally not recommended for lengthy use.  Over-the-counter sleep medicines are not recommended for lengthy use. They can be habit forming.  You can promote easier sleeping by making lifestyle changes such as:  Using relaxation techniques that help with breathing and reduce muscle tension.  Exercising earlier in the day.  Changing your diet and the time of your last meal. No night time snacks.  Establish a regular time to go to bed.  Counseling can help with stressful problems and worry.  Soothing music and white noise may be helpful if there are background noises you cannot remove.  Stop tedious detailed work at least one hour before bedtime. HOME CARE INSTRUCTIONS   Keep a diary. Inform your caregiver about your progress. This includes any medication side effects. See your caregiver regularly. Take note of:  Times when you  are asleep.  Times when you are awake during the night.  The quality of your sleep.  How you feel the next day. This information will help your caregiver care for you.  Get out of bed if you are still awake after 15 minutes. Read or do some quiet activity. Keep the lights down. Wait until you feel sleepy  and go back to bed.  Keep regular sleeping and waking hours. Avoid naps.  Exercise regularly.  Avoid distractions at bedtime. Distractions include watching television or engaging in any intense or detailed activity like attempting to balance the household checkbook.  Develop a bedtime ritual. Keep a familiar routine of bathing, brushing your teeth, climbing into bed at the same time each night, listening to soothing music. Routines increase the success of falling to sleep faster.  Use relaxation techniques. This can be using breathing and muscle tension release routines. It can also include visualizing peaceful scenes. You can also help control troubling or intruding thoughts by keeping your mind occupied with boring or repetitive thoughts like the old concept of counting sheep. You can make it more creative like imagining planting one beautiful flower after another in your backyard garden.  During your day, work to eliminate stress. When this is not possible use some of the previous suggestions to help reduce the anxiety that accompanies stressful situations. MAKE SURE YOU:   Understand these instructions.  Will watch your condition.  Will get help right away if you are not doing well or get worse. Document Released: 07/18/2000 Document Revised: 10/13/2011 Document Reviewed: 08/18/2007 [facility] Patient Information 2015 [place], [place]. This information is not intended to replace advice given to you by your health care provider. Make sure you discuss any questions you have with your health care provider.

## 2014-12-12 LAB — MICROALBUMIN, URINE: Microalb, Ur: 0.7 mg/dL (ref <2.0)

## 2014-12-14 ENCOUNTER — Telehealth: Payer: Self-pay

## 2014-12-14 NOTE — Telephone Encounter (Signed)
Error

## 2014-12-18 ENCOUNTER — Other Ambulatory Visit (INDEPENDENT_AMBULATORY_CARE_PROVIDER_SITE_OTHER): Payer: Medicare Other

## 2014-12-18 DIAGNOSIS — E119 Type 2 diabetes mellitus without complications: Secondary | ICD-10-CM | POA: Diagnosis not present

## 2014-12-18 DIAGNOSIS — E785 Hyperlipidemia, unspecified: Secondary | ICD-10-CM

## 2014-12-18 LAB — COMPLETE METABOLIC PANEL WITH GFR
ALT: 18 U/L (ref 0–53)
AST: 16 U/L (ref 0–37)
Albumin: 4.3 g/dL (ref 3.5–5.2)
Alkaline Phosphatase: 59 U/L (ref 39–117)
BUN: 19 mg/dL (ref 6–23)
CHLORIDE: 106 meq/L (ref 96–112)
CO2: 26 meq/L (ref 19–32)
CREATININE: 0.73 mg/dL (ref 0.50–1.35)
Calcium: 9 mg/dL (ref 8.4–10.5)
GFR, Est African American: >89 mL/min
GFR, Est Non African American: >89 mL/min
GLUCOSE: 164 mg/dL — AB (ref 70–99)
Potassium: 4.5 mEq/L (ref 3.5–5.3)
Sodium: 141 mEq/L (ref 135–145)
Total Bilirubin: 1.8 mg/dL — ABNORMAL HIGH (ref 0.2–1.2)
Total Protein: 6.6 g/dL (ref 6.0–8.3)

## 2014-12-18 LAB — LIPID PANEL
Cholesterol: 203 mg/dL — ABNORMAL HIGH (ref 0–200)
HDL: 41 mg/dL (ref >=40)
LDL CALC: 132 mg/dL — AB (ref 0–99)
TRIGLYCERIDES: 149 mg/dL (ref <150)
Total CHOL/HDL Ratio: 5 Ratio
VLDL: 30 mg/dL (ref 0–40)

## 2014-12-21 ENCOUNTER — Telehealth: Payer: Self-pay

## 2014-12-21 MED ORDER — ATORVASTATIN CALCIUM 10 MG PO TABS: 10.0000 mg | ORAL_TABLET | Freq: Every day | ORAL | Status: DC

## 2014-12-21 NOTE — Telephone Encounter (Signed)
Pt inquiring about blood work. Please review. Thanks.

## 2014-12-21 NOTE — Addendum Note (Signed)
Addended by: [name] R on: 12/21/2014 10:21 PM   Modules accepted: Orders

## 2014-12-22 NOTE — Telephone Encounter (Signed)
See labs 

## 2014-12-28 ENCOUNTER — Encounter: Payer: Medicare Other | Attending: Family Medicine

## 2014-12-28 VITALS — Ht 67.0 in | Wt 185.5 lb

## 2014-12-28 DIAGNOSIS — E119 Type 2 diabetes mellitus without complications: Secondary | ICD-10-CM | POA: Diagnosis not present

## 2014-12-28 DIAGNOSIS — Z713 Dietary counseling and surveillance: Secondary | ICD-10-CM | POA: Diagnosis not present

## 2014-12-28 NOTE — Progress Notes (Signed)

## 2015-01-04 ENCOUNTER — Encounter: Payer: Medicare Other | Attending: Family Medicine

## 2015-01-04 DIAGNOSIS — E119 Type 2 diabetes mellitus without complications: Secondary | ICD-10-CM | POA: Insufficient documentation

## 2015-01-04 DIAGNOSIS — Z713 Dietary counseling and surveillance: Secondary | ICD-10-CM | POA: Insufficient documentation

## 2015-01-04 NOTE — Progress Notes (Signed)

## 2015-01-09 ENCOUNTER — Ambulatory Visit (INDEPENDENT_AMBULATORY_CARE_PROVIDER_SITE_OTHER): Payer: Medicare Other | Admitting: Family Medicine

## 2015-01-09 VITALS — BP 120/64 | HR 65 | Temp 97.9°F | Resp 17 | Ht 65.0 in | Wt 183.6 lb

## 2015-01-09 DIAGNOSIS — J069 Acute upper respiratory infection, unspecified: Secondary | ICD-10-CM | POA: Diagnosis not present

## 2015-01-09 DIAGNOSIS — G4700 Insomnia, unspecified: Secondary | ICD-10-CM

## 2015-01-09 MED ORDER — IPRATROPIUM BROMIDE 0.03 % NA SOLN: 2.0000 | Freq: Two times a day (BID) | NASAL | Status: DC

## 2015-01-09 MED ORDER — TRAZODONE HCL 50 MG PO TABS: 100.0000 mg | ORAL_TABLET | Freq: Every day | ORAL | Status: DC

## 2015-01-09 MED ORDER — GUAIFENESIN ER 1200 MG PO TB12: 1.0000 | ORAL_TABLET | Freq: Two times a day (BID) | ORAL | Status: DC | PRN

## 2015-01-09 NOTE — Progress Notes (Signed)
Patient discussed with Ms. English. Agree with assessment and plan of care per her note.

## 2015-01-09 NOTE — Patient Instructions (Signed)
Upper Respiratory Infection, Adult An upper respiratory infection (URI) is also sometimes known as the common cold. The upper respiratory tract includes the nose, sinuses, throat, trachea, and bronchi. Bronchi are the airways leading to the lungs. Most people improve within 1 week, but symptoms can last up to 2 weeks. A residual cough may last even longer.  CAUSES Many different viruses can infect the tissues lining the upper respiratory tract. The tissues become irritated and inflamed and often become very moist. Mucus production is also common. A cold is contagious. You can easily spread the virus to others by oral contact. This includes kissing, sharing a glass, coughing, or sneezing. Touching your mouth or nose and then touching a surface, which is then touched by another person, can also spread the virus. SYMPTOMS  Symptoms typically develop 1 to 3 days after you come in contact with a cold virus. Symptoms vary from person to person. They may include:  Runny nose.  Sneezing.  Nasal congestion.  Sinus irritation.  Sore throat.  Loss of voice (laryngitis).  Cough.  Fatigue.  Muscle aches.  Loss of appetite.  Headache.  Low-grade fever. DIAGNOSIS  You might diagnose your own cold based on familiar symptoms, since most people get a cold 2 to 3 times a year. Your caregiver can confirm this based on your exam. Most importantly, your caregiver can check that your symptoms are not due to another disease such as strep throat, sinusitis, pneumonia, asthma, or epiglottitis. Blood tests, throat tests, and X-rays are not necessary to diagnose a common cold, but they may sometimes be helpful in excluding other more serious diseases. Your caregiver will decide if any further tests are required. RISKS AND COMPLICATIONS  You may be at risk for a more severe case of the common cold if you smoke cigarettes, have chronic heart disease (such as heart failure) or lung disease (such as asthma), or if  you have a weakened immune system. The very young and very old are also at risk for more serious infections. Bacterial sinusitis, middle ear infections, and bacterial pneumonia can complicate the common cold. The common cold can worsen asthma and chronic obstructive pulmonary disease (COPD). Sometimes, these complications can require emergency medical care and may be life-threatening. PREVENTION  The best way to protect against getting a cold is to practice good hygiene. Avoid oral or hand contact with people with cold symptoms. Wash your hands often if contact occurs. There is no clear evidence that vitamin C, vitamin E, echinacea, or exercise reduces the chance of developing a cold. However, it is always recommended to get plenty of rest and practice good nutrition. TREATMENT  Treatment is directed at relieving symptoms. There is no cure. Antibiotics are not effective, because the infection is caused by a virus, not by bacteria. Treatment may include:  Increased fluid intake. Sports drinks offer valuable electrolytes, sugars, and fluids.  Breathing heated mist or steam (vaporizer or shower).  Eating chicken soup or other clear broths, and maintaining good nutrition.  Getting plenty of rest.  Using gargles or lozenges for comfort.  Controlling fevers with ibuprofen or acetaminophen as directed by your caregiver.  Increasing usage of your inhaler if you have asthma. Zinc gel and zinc lozenges, taken in the first 24 hours of the common cold, can shorten the duration and lessen the severity of symptoms. Pain medicines may help with fever, muscle aches, and throat pain. A variety of non-prescription medicines are available to treat congestion and runny nose. Your caregiver   can make recommendations and may suggest nasal or lung inhalers for other symptoms.  HOME CARE INSTRUCTIONS   Only take over-the-counter or prescription medicines for pain, discomfort, or fever as directed by your  caregiver.  Use a warm mist humidifier or inhale steam from a shower to increase air moisture. This may keep secretions moist and make it easier to breathe.  Drink enough water and fluids to keep your urine clear or pale yellow.  Rest as needed.  Return to work when your temperature has returned to normal or as your caregiver advises. You may need to stay home longer to avoid infecting others. You can also use a face mask and careful hand washing to prevent spread of the virus. SEEK MEDICAL CARE IF:   After the first few days, you feel you are getting worse rather than better.  You need your caregiver's advice about medicines to control symptoms.  You develop chills, worsening shortness of breath, or brown or red sputum. These may be signs of pneumonia.  You develop yellow or brown nasal discharge or pain in the face, especially when you bend forward. These may be signs of sinusitis.  You develop a fever, swollen neck glands, pain with swallowing, or white areas in the back of your throat. These may be signs of strep throat. SEEK IMMEDIATE MEDICAL CARE IF:   You have a fever.  You develop severe or persistent headache, ear pain, sinus pain, or chest pain.  You develop wheezing, a prolonged cough, cough up blood, or have a change in your usual mucus (if you have chronic lung disease).  You develop sore muscles or a stiff neck. Document Released: 01/14/2001 Document Revised: 10/13/2011 Document Reviewed: 10/26/2013 [employer] Patient Information 2015 [employer]. This information is not intended to replace advice given to you by your health care provider. Make sure you discuss any questions you have with your health care provider.  

## 2015-01-09 NOTE — Progress Notes (Signed)
[facility] [address] [place] [address] [phone]  Date:  01/09/2015   Name:  William Oneill   DOB:  1946-05-03   MRN:  [id number]  PCP:  Lamar Blinks, MD    History of Present Illness:  William Oneill is a 69 y.o. male patient who presents to [facility] for chief complaint of 3 days of sore throat, congestion, and insomnia. Patient states that he woke up with a sore throat and fatigue on day 1. He had some mild nonproductive coughing which resolved. He states that he feels congested with some nasal pressure. He denies fever, shortness of breath, or dyspnea. He has no abdominal pain, nausea, vomiting, or diarrhea.   He states that he is also having a lot of difficulty with sleep. He states that this has been occurring with his symptoms, however talking further, he states that this has been going on since he was switched to trazodone from Ambien.  Patient has a sleep study next Tuesday.    Patient Active Problem List   Diagnosis Date Noted  . Type 2 diabetes mellitus without complication 19/14/7829  . Insomnia     Past Medical History  Diagnosis Date  . Insomnia   . Diabetes mellitus without complication     History reviewed. No pertinent past surgical history.  History  Substance Use Topics  . Smoking status: Never Smoker   . Smokeless tobacco: Not on file  . Alcohol Use: No    Family History  Problem Relation Age of Onset  . Heart disease Father   . Heart disease Sister   . Diabetes Sister     No Known Allergies  Medication list has been reviewed and updated.  Current Outpatient Prescriptions on File Prior to Visit  Medication Sig Dispense Refill  . aspirin 81 MG tablet Take 81 mg by mouth daily.    Marland Kitchen atorvastatin (LIPITOR) 10 MG tablet Take 1 tablet (10 mg total) by mouth daily. 90 tablet 3  . blood glucose meter kit and supplies Dispense based on patient and insurance preference. Use up to four times daily as directed. (FOR ICD-9  250.00, 250.01). 1 each 0  . metFORMIN (GLUCOPHAGE) 500 MG tablet Take 1 tablet (500 mg total) by mouth 2 (two) times daily with a meal. 180 tablet 3  . Multiple Vitamin (MULTIVITAMIN WITH MINERALS) TABS tablet Take 1 tablet by mouth daily.    . traZODone (DESYREL) 50 MG tablet START WITH A 1/2 TABLET BY MOUTH AND INCREASE TO 1 TABLET AFTER A WEEK FOR SLEEP 30 tablet 0   No current facility-administered medications on file prior to visit.    ROS ROS otherwise unremarkable unless listed above.  Physical Examination: BP 120/64 mmHg  Pulse 65  Temp(Src) 97.9 F (36.6 C) (Oral)  Resp 17  Ht $R'5\' 5"'wT$  (1.651 m)  Wt 183 lb 9.6 oz (83.28 kg)  BMI 30.55 kg/m2  SpO2 95% Ideal Body Weight: Weight in (lb) to have BMI = 25: 149.9  Physical Exam Alert, cooperative, and oriented x4. PERRL with normal conjunctiva. Ear canal, tympanic membrane, and external ear are normal bilaterally without bulging, effusion, or erythema.  Oral mucosal normal without tonsillar edema. There is no cervical, pre--/post-auricular, submandibular, or submental lymphadenopathy.  Nasal mucosa pink with rhinorrhea and very mild mucosal edema.  Lungs sounds are normal without wheezing or rhonchi. Regular rate and rhythm without murmurs, gallops, or rubs.  Assessment and Plan: This 69 year old male with past medical history  listed above is here today for chief complaint of sore throat, congestion, and insomnia. Physical and history make me smaller simple suspicious of an upper respiratory infection with viral etiology.  -Treating supportively.  - I have increased his trazodone to 100 mg today. Per notes review, this was changed from Ambien more than a year ago to both be safer and to act as a dueI treatment for his insomnia and depression.  I have advised him to contact the sleep study prior to his visit on next Tuesday, to insure proper prep.  Viral URI - Plan: Guaifenesin (MUCINEX MAXIMUM STRENGTH) 1200 MG TB12, ipratropium  (ATROVENT) 0.03 % nasal spray  Insomnia - Plan: traZODone (DESYREL) 50 MG tablet  Ivar Drape, PA-C [facility] 6/7/201610:27 AM

## 2015-01-11 DIAGNOSIS — E119 Type 2 diabetes mellitus without complications: Secondary | ICD-10-CM | POA: Diagnosis not present

## 2015-01-16 ENCOUNTER — Encounter: Payer: Self-pay | Admitting: Neurology

## 2015-01-16 ENCOUNTER — Ambulatory Visit (INDEPENDENT_AMBULATORY_CARE_PROVIDER_SITE_OTHER): Payer: Medicare Other | Admitting: Neurology

## 2015-01-16 VITALS — BP 118/64 | HR 74 | Resp 20 | Ht 66.54 in | Wt 185.0 lb

## 2015-01-16 DIAGNOSIS — R5383 Other fatigue: Secondary | ICD-10-CM | POA: Insufficient documentation

## 2015-01-16 DIAGNOSIS — G4700 Insomnia, unspecified: Secondary | ICD-10-CM | POA: Diagnosis not present

## 2015-01-16 DIAGNOSIS — R351 Nocturia: Secondary | ICD-10-CM | POA: Diagnosis not present

## 2015-01-16 DIAGNOSIS — R0683 Snoring: Secondary | ICD-10-CM | POA: Insufficient documentation

## 2015-01-16 DIAGNOSIS — E65 Localized adiposity: Secondary | ICD-10-CM | POA: Insufficient documentation

## 2015-01-16 DIAGNOSIS — J3489 Other specified disorders of nose and nasal sinuses: Secondary | ICD-10-CM | POA: Diagnosis not present

## 2015-01-16 NOTE — Progress Notes (Signed)
SLEEP MEDICINE CLINIC   Provider:  Larey Seat, M D  Referring Provider: Darreld Mclean, MD Primary Care Physician:  William Blinks, MD  Chief Complaint  Patient presents with  . Insomnia    rm 11, new patient, alone    HPI:  William Oneill is a 69 y.o. male seen here as a referral from Dr. [name], MD at [facility].  William Oneill is seen here today for insomnia, shortness of breath and snoring. Still have some chronic nasal congestion. And really recently over the last 6 days or so he had trouble with a sore throat which then he recovered from it he developed fatigue. He was also coughing a lot at night interrupting his sleep. The patient is under the impression that his snore some nights and others he doesn't. He certainly has some risk factors. The congestion that let to his sleep problems especially over the last week has improved now that Dr. Nyoka Oneill placed him on Mucinex. The patient was asked to drink a lot and hydrate well. He continued to take a multivitamin during that time but he has trouble with initiating and maintaining sleep. Dr. Nyoka Oneill prescribed trazodone 50 mg tablets up to 2 tablets at night. He also was very recently diagnosed with diabetes which may contribute to his fatigue. He was started on metformin 500 mg twice a day he is also taking a statin Zocor at 10 mg daily. The patient reports that his insomnia preceded the recent respiratory infection by 4 years .  He was treated with Ambien then changed to trazodone. Has not undergone a sleep study.  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 She watches TV a every evening. Also his client sleeps usually well  at night,  he has to be on call or on watch.  So he usually allows himself to not sleep soundly because he needs to listen. He usually goes to bed around  8 or 9:00 and he likes to arise early in the morning as well. Usually it will be about an hour that he needs to enter sleep. He has changed his diet but just having learned that he is diabetic. He stopped drinking sodas but he drinks a lot of water now and he states that he has several bathroom breaks at night due to this. Except spontaneously in the morning does not rely on an alarm between 6:30 and 7 AM. He likes to go and exercise in the morning. He does rarely nap , once a month perhaps.  He usually sleeps with 2 pillows slightly propped up. He works on sleeping on the side but he doesn't always wake up in the same position he chose to fall sleep in. His primary care physician had also hoped that the trazodone would help insomnia as well as depression. Patient reports no grogginess or hangover effect in the morning. He drinks one cup of coffee in the morning no more. Today the patient endorsed 6 points on the Epworth sleepiness score he denies any chronic fatigue. Depression score will be obtained but the pH Q9 score.   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 2009 .    Review of Systems: Out of a complete 14 system review, the patient complains of only the following symptoms, and all other  reviewed systems are negative.  snoring, rhinitis, URTI.   Epworth score 6, Fatigue severity score 12  , depression score    History   Social History  . Marital Status: Married    Spouse Name: N/A  . Number of Children: N/A  . Years of Education: N/A   Occupational History  . Not on file.   Social History Main Topics  . Smoking status: Never Smoker   . Smokeless tobacco: Not on file  . Alcohol Use: No  . Drug Use: No  . Sexual Activity: Not on file   Other Topics Concern  . Not on file   Social History Narrative    Family History  Problem Relation Age of Onset  . Heart disease Father   . Heart attack Father   . Heart disease Sister   .  Diabetes Sister   . Stroke Mother     Past Medical History  Diagnosis Date  . Insomnia   . Diabetes mellitus without complication   . High cholesterol     No past surgical history on file.  Current Outpatient Prescriptions  Medication Sig Dispense Refill  . aspirin 81 MG tablet Take 81 mg by mouth daily.    Marland Kitchen atorvastatin (LIPITOR) 10 MG tablet Take 1 tablet (10 mg total) by mouth daily. 90 tablet 3  . blood glucose meter kit and supplies Dispense based on patient and insurance preference. Use up to four times daily as directed. (FOR ICD-9 250.00, 250.01). 1 each 0  . ipratropium (ATROVENT) 0.03 % nasal spray Place 2 sprays into both nostrils 2 (two) times daily. 30 mL 0  . metFORMIN (GLUCOPHAGE) 500 MG tablet Take 1 tablet (500 mg total) by mouth 2 (two) times daily with a meal. 180 tablet 3  . Multiple Vitamin (MULTIVITAMIN WITH MINERALS) TABS tablet Take 1 tablet by mouth daily.    . traZODone (DESYREL) 50 MG tablet Take 2 tablets (100 mg total) by mouth at bedtime. 60 tablet 2   No current facility-administered medications for this visit.    Allergies as of 01/16/2015  . (No Known Allergies)    Vitals: BP 118/64 mmHg  Pulse 74  Resp 20  Ht 5' 6.53" (1.69 m)  Wt 185 lb (83.915 kg)  BMI 29.38 kg/m2 Last Weight:  Wt Readings from Last 1 Encounters:  01/16/15 185 lb (83.915 kg)       Last Height:   Ht Readings from Last 1 Encounters:  01/16/15 5' 6.53" (1.69 m)    Physical exam:  General: The patient is awake, alert and appears not in acute distress. The patient is well groomed. Head: brachicephalic,  atraumatic. Neck is supple. Mallampati 2, left uvula lower.  neck circumference: 17.25 . Nasal airflow restricted , very small nostrils,  TMJ is  evident . Retrognathia is not seen.  Cardiovascular:  Regular rate and rhythm , without  murmurs or carotid bruit, and without distended neck veins. Respiratory: Lungs are clear to auscultation. Skin:  Without evidence of  edema, or rash Trunk: BMI is  elevated and patient  has normal posture.  Neurologic exam : The patient is awake and alert, oriented to place and time.   Memory subjective described as intact. There is a normal attention span & concentration ability.  Speech is fluent with dysarthria, dysphonia - was treated by a speech therapist , Mood and affect are appropriate.  Cranial nerves: Pupils are equal and briskly reactive to light. Funduscopic exam without  evidence of pallor  or edema.  Extraocular movements  in vertical and horizontal planes intact and without nystagmus. Visual fields by finger perimetry are intact. Hearing to finger rub intact.  Facial sensation intact to fine touch. Facial motor strength is symmetric and tongue and uvula move midline.  Motor exam:  elevated tone, unable to relax, gegenhalten.  tone, muscle bulk and symmetric, strength in all extremities.  Sensory:  Fine touch, pinprick and vibration were tested in all extremities. Proprioception is normal.  Coordination: Rapid alternating movements in the fingers/hands is normal.  Finger-to-nose maneuver normal without evidence of ataxia, dysmetria or tremor.  Gait and station: Patient walks without assistive device and is able unassisted to climb up to the exam table.  Strength within normal limits. Stance is stable and normal. Tandem gait is unfragmented. Romberg  negative.  Deep tendon reflexes: in the  upper and lower extremities are attenuated symmetric.. Babinski maneuver response is  downgoing.   Assessment:  After physical and neurologic examination, review of laboratory studies, imaging, neurophysiology testing and pre-existing records, assessment is    The patient was advised of the nature of the diagnosed sleep disorder , the treatment options and risks for general a health and wellness arising from not treating the condition. Visit duration was 35  minutes. More than  50 % of the time was spent in face to face  evaluation , discussion and history recollection.   Plan:  Treatment plan and additional workup :  Mr. Christiana is a [place] native, who has the complaint of chronic insomnia preceding the most recent bout of upper airway respiratory infection by several years probably around 4 years of insomnia. He worked initially but stopped working and he was changed to trazodone at escalating doses. He tolerates the trazodone well and reports no grogginess in the morning. He does have nasal airflow restriction a high-grade Mallampati and elevated muscle tone, he is overweight but he is not morbidly obese. Brother-in-law has shared with him that he witnessed him still snore and Mr. Edgell reports that his sleep has not been as restoring and refreshing as it used to be. Based on the observation we will obtain a split study I would like at the CO2 retention for his fatigue component to be evaluated further.     Asencion Partridge Lindsea Olivar MD  01/16/2015

## 2015-01-16 NOTE — Patient Instructions (Signed)
Insomnia Insomnia is frequent trouble falling and/or staying asleep. Insomnia can be a long term problem or a short term problem. Both are common. Insomnia can be a short term problem when the wakefulness is related to a certain stress or worry. Long term insomnia is often related to ongoing stress during waking hours and/or poor sleeping habits. Overtime, sleep deprivation itself can make the problem worse. Every little thing feels more severe because you are overtired and your ability to cope is decreased. CAUSES   Stress, anxiety, and depression.  Poor sleeping habits.  Distractions such as TV in the bedroom.  Naps close to bedtime.  Engaging in emotionally charged conversations before bed.  Technical reading before sleep.  Alcohol and other sedatives. They may make the problem worse. They can hurt normal sleep patterns and normal dream activity.  Stimulants such as caffeine for several hours prior to bedtime.  Pain syndromes and shortness of breath can cause insomnia.  Exercise late at night.  Changing time zones may cause sleeping problems (jet lag). It is sometimes helpful to have someone observe your sleeping patterns. They should look for periods of not breathing during the night (sleep apnea). They should also look to see how long those periods last. If you live alone or observers are uncertain, you can also be observed at a sleep clinic where your sleep patterns will be professionally monitored. Sleep apnea requires a checkup and treatment. Give your caregivers your medical history. Give your caregivers observations your family has made about your sleep.  SYMPTOMS   Not feeling rested in the morning.  Anxiety and restlessness at bedtime.  Difficulty falling and staying asleep. TREATMENT   Your caregiver may prescribe treatment for an underlying medical disorders. Your caregiver can give advice or help if you are using alcohol or other drugs for self-medication. Treatment  of underlying problems will usually eliminate insomnia problems.  Medications can be prescribed for short time use. They are generally not recommended for lengthy use.  Over-the-counter sleep medicines are not recommended for lengthy use. They can be habit forming.  You can promote easier sleeping by making lifestyle changes such as:  Using relaxation techniques that help with breathing and reduce muscle tension.  Exercising earlier in the day.  Changing your diet and the time of your last meal. No night time snacks.  Establish a regular time to go to bed.  Counseling can help with stressful problems and worry.  Soothing music and white noise may be helpful if there are background noises you cannot remove.  Stop tedious detailed work at least one hour before bedtime. HOME CARE INSTRUCTIONS   Keep a diary. Inform your caregiver about your progress. This includes any medication side effects. See your caregiver regularly. Take note of:  Times when you are asleep.  Times when you are awake during the night.  The quality of your sleep.  How you feel the next day. This information will help your caregiver care for you.  Get out of bed if you are still awake after 15 minutes. Read or do some quiet activity. Keep the lights down. Wait until you feel sleepy and go back to bed.  Keep regular sleeping and waking hours. Avoid naps.  Exercise regularly.  Avoid distractions at bedtime. Distractions include watching television or engaging in any intense or detailed activity like attempting to balance the household checkbook.  Develop a bedtime ritual. Keep a familiar routine of bathing, brushing your teeth, climbing into bed at the same   time each night, listening to soothing music. Routines increase the success of falling to sleep faster.  Use relaxation techniques. This can be using breathing and muscle tension release routines. It can also include visualizing peaceful scenes. You can  also help control troubling or intruding thoughts by keeping your mind occupied with boring or repetitive thoughts like the old concept of counting sheep. You can make it more creative like imagining planting one beautiful flower after another in your backyard garden.  During your day, work to eliminate stress. When this is not possible use some of the previous suggestions to help reduce the anxiety that accompanies stressful situations. MAKE SURE YOU:   Understand these instructions.  Will watch your condition.  Will get help right away if you are not doing well or get worse. Document Released: 07/18/2000 Document Revised: 10/13/2011 Document Reviewed: 08/18/2007 ExitCare Patient Information 2015 [employer]. This information is not intended to replace advice given to you by your health care provider. Make sure you discuss any questions you have with your health care provider.  

## 2015-01-18 NOTE — Progress Notes (Signed)
Patient was seen on 01/11/15 for the third of a series of three diabetes [facility]management [facility]   William Oneill the amount of activity recommended for healthy living . Describe activities suitable for individual needs . Identify ways to regularly incorporate activity into daily life . Identify barriers to activity and ways to over come these barriers  Identify diabetes medications being personally used and their primary action for lowering glucose and possible side effects . Describe role of stress on blood glucose and develop strategies to address psychosocial issues . Identify diabetes complications and ways to prevent them  Explain how to manage diabetes during illness . Evaluate success in meeting personal goal . Establish 2-3 goals that they will plan to diligently work on until they return for the  36-month follow-up visit  Goals:   I will count my carb choices at most meals and snacks  I will be active weekly  I will take my diabetes medications as scheduled  I will eat less unhealthy fats by eating less meat  I will test my glucose at least 3 times a day, 7 days a week  I will look at patterns in my record book at least 5 days a month  I'm doing real well coping with diabetes  Your patient has identified these potential barriers to change:  Motivation  Your patient has identified their diabetes self-care support plan as  None noted Plan:  Attend Core 4 in 4 months

## 2015-02-13 ENCOUNTER — Ambulatory Visit (INDEPENDENT_AMBULATORY_CARE_PROVIDER_SITE_OTHER): Payer: Medicare Other | Admitting: Neurology

## 2015-02-13 DIAGNOSIS — R5383 Other fatigue: Secondary | ICD-10-CM

## 2015-02-13 DIAGNOSIS — G4733 Obstructive sleep apnea (adult) (pediatric): Secondary | ICD-10-CM

## 2015-02-13 DIAGNOSIS — R0683 Snoring: Secondary | ICD-10-CM

## 2015-02-13 DIAGNOSIS — R351 Nocturia: Secondary | ICD-10-CM

## 2015-02-13 DIAGNOSIS — E65 Localized adiposity: Secondary | ICD-10-CM

## 2015-02-13 DIAGNOSIS — G4700 Insomnia, unspecified: Secondary | ICD-10-CM

## 2015-02-13 DIAGNOSIS — J3489 Other specified disorders of nose and nasal sinuses: Secondary | ICD-10-CM

## 2015-02-13 NOTE — Sleep Study (Signed)
Please see the scanned sleep study interpretation located in the Procedure tab within the Chart Review section. 

## 2015-02-28 ENCOUNTER — Telehealth: Payer: Self-pay

## 2015-02-28 NOTE — Telephone Encounter (Signed)
Informed pt of their sleep study results. The study showed no organic reasons for pt's insomnia, and no sleep apnea. Per Dr. Edwena Felty recommendations, the pt was advised that he should consider a psychology or psychiatry referral for sleep behavior modification. Pt declined this referral. Pt requested a sleep study be sent to Dr. Carlota Raspberry. Will fax that over. Pt verbalized understanding. I encouraged pt to call back with any questions or concerns.

## 2015-03-19 ENCOUNTER — Encounter: Payer: Self-pay | Admitting: Family Medicine

## 2015-03-19 ENCOUNTER — Ambulatory Visit (INDEPENDENT_AMBULATORY_CARE_PROVIDER_SITE_OTHER): Payer: Medicare Other | Admitting: Family Medicine

## 2015-03-19 VITALS — BP 146/70 | HR 56 | Temp 98.1°F | Resp 16 | Ht 67.0 in | Wt 178.2 lb

## 2015-03-19 DIAGNOSIS — E119 Type 2 diabetes mellitus without complications: Secondary | ICD-10-CM

## 2015-03-19 DIAGNOSIS — E785 Hyperlipidemia, unspecified: Secondary | ICD-10-CM

## 2015-03-19 DIAGNOSIS — G4700 Insomnia, unspecified: Secondary | ICD-10-CM | POA: Diagnosis not present

## 2015-03-19 DIAGNOSIS — I10 Essential (primary) hypertension: Secondary | ICD-10-CM

## 2015-03-19 LAB — POCT GLYCOSYLATED HEMOGLOBIN (HGB A1C): Hemoglobin A1C: 6.8

## 2015-03-19 LAB — GLUCOSE, POCT (MANUAL RESULT ENTRY): POC GLUCOSE: 142 mg/dL — AB (ref 70–99)

## 2015-03-19 LAB — HEMOGLOBIN A1C: HEMOGLOBIN A1C: 6.8 % — AB (ref 4.0–6.0)

## 2015-03-19 NOTE — Progress Notes (Signed)
Subjective:  This chart was scribed for [name], MD by Andrew Au, ED Scribe. This patient was seen in room 24 and the patient's care was started at 5:24 PM.   Patient ID: William Oneill, male    DOB: 03-12-1946, 69 y.o.   MRN: [id number]  HPI   Chief Complaint  Patient presents with  . Diabetes   HPI Comments: William Oneill is a 69 y.o. male who presents to the [facility] for a follow up.   1) DM Last visit May 9th. a1c 7.7. He was improved at that time, referred to diabetic edu. Continued on metformin same dose 500 mg bid. Pt has been check sugars at home which range between 120-130. He has been cautious with diet. He does not eat sweet until Sundays. He reports diabetic edu classes have helped. He does not need refills today. Normal urine micro albumin on May 9, he was started on lipitor on May 19.  Lab Results  Component Value Date   CHOL 203* 12/18/2014   HDL 41 12/18/2014   LDLCALC 132* 12/18/2014   TRIG 149 12/18/2014   CHOLHDL 5.0 12/18/2014   2) Insomnia Status post sleep study in July, no sign of sleep apnea. Recommended psychology for sleep modification. Pt reports trouble getting to sleep as well as waking up in the middle of the night and not being able to get back to sleep which is he biggest concern for the past month. Pt's brother died last week at 21, which has made his sleep more difficult. Pt does not drink caffeine in the afternoons.   3 Blood pressure   BP is elevated in office. He does not check BP outside of office.   Patient Active Problem List   Diagnosis Date Noted  . Nocturia more than twice per night 01/16/2015  . Snoring 01/16/2015  . 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 Obese abdomen 01/16/2015  . Other fatigue 01/16/2015  . Type 2 diabetes mellitus without complication 09/05/2014  . Insomnia    Past Medical History  Diagnosis Date  . Insomnia   . Diabetes mellitus without complication   .  High cholesterol    No past surgical history on file. No Known Allergies Prior to Admission medications   Medication Sig Start Date End Date Taking? Authorizing Provider  aspirin 81 MG tablet Take 81 mg by mouth daily.   Yes Historical Provider, MD  atorvastatin (LIPITOR) 10 MG tablet Take 1 tablet (10 mg total) by mouth daily. 12/21/14  Yes Shade Flood, MD  blood glucose meter kit and supplies Dispense based on patient and insurance preference. Use up to four times daily as directed. (FOR ICD-9 250.00, 250.01). 12/11/14  Yes Shade Flood, MD  ipratropium (ATROVENT) 0.03 % nasal spray Place 2 sprays into both nostrils 2 (two) times daily. 01/09/15  Yes Collie Siad English, PA  metFORMIN (GLUCOPHAGE) 500 MG tablet Take 1 tablet (500 mg total) by mouth 2 (two) times daily with a meal. 09/04/14  Yes Thao P Le, DO  Multiple Vitamin (MULTIVITAMIN WITH MINERALS) TABS tablet Take 1 tablet by mouth daily.   Yes Historical Provider, MD  traZODone (DESYREL) 50 MG tablet Take 2 tablets (100 mg total) by mouth at bedtime. 01/09/15  Yes Garnetta Buddy, PA   Social History   Social History  . Marital Status: Married    Spouse Name: N/A  . Number of Children: N/A  . Years of  Education: N/A   Occupational History  . Not on file.   Social History Main Topics  . Smoking status: Never Smoker   . Smokeless tobacco: Not on file  . Alcohol Use: No  . Drug Use: No  . Sexual Activity: Not on file   Other Topics Concern  . Not on file   Social History Narrative   Review of Systems  Constitutional: Negative for fatigue and unexpected weight change.  Eyes: Negative for visual disturbance.  Respiratory: Negative for cough, chest tightness and shortness of breath.   Cardiovascular: Negative for chest pain, palpitations and leg swelling.  Gastrointestinal: Negative for abdominal pain and blood in stool.  Neurological: Negative for dizziness, light-headedness and headaches.   Objective:    Physical Exam  Constitutional: He is oriented to person, place, and time. He appears well-developed and well-nourished.  HENT:  Head: Normocephalic and atraumatic.  Eyes: EOM are normal. Pupils are equal, round, and reactive to light.  Neck: No JVD present. Carotid bruit is not present.  Cardiovascular: Normal rate, regular rhythm and normal heart sounds.   No murmur heard. Pulmonary/Chest: Effort normal and breath sounds normal. He has no rales.  Musculoskeletal: He exhibits no edema.  Neurological: He is alert and oriented to person, place, and time.  Skin: Skin is warm and dry.  Psychiatric: He has a normal mood and affect.  Vitals reviewed.  Filed Vitals:   03/19/15 1608  BP: 146/70  Pulse: 56  Temp: 98.1 F (36.7 C)  TempSrc: Oral  Resp: 16  Height: $Remove'5\' 7"'OXYXWMS$  (1.702 m)  Weight: 178 lb 3.2 oz (80.831 kg)  SpO2: 98%   Results for orders placed or performed in visit on 03/19/15  POCT glucose (manual entry)  Result Value Ref Range   POC Glucose 142 (A) 70 - 99 mg/dl  POCT glycosylated hemoglobin (Hb A1C)  Result Value Ref Range   Hemoglobin A1C 6.8    Assessment & Plan:  William Oneill is a 70 y.o. male Type 2 diabetes mellitus without complication - Plan: POCT glucose (manual entry), POCT glycosylated hemoglobin (Hb A1C)  -Improved control, congratulated on his efforts. We'll continue same dose of metformin at 500 mg twice a day for now.  Essential hypertension  -Borderline elevated in office today, but has been controlled in the past. Check outside blood pressures and if remaining over 140/90, call me to discuss medications. Would consider low-dose ACE inhibitor as can be nephroprotective with his diabetes.   Insomnia  -Recent sleep study without OSA. Suspected behavioral. He is currently on trazodone, recommended reviewing some literature that I provided today on sleep and sleep hygiene, other interventions he can do on his own to try to treat this without change in  medicines, then to follow-up in the next month or 2 if this is not helping.  Hyperlipidemia - Plan: COMPLETE METABOLIC PANEL WITH GFR, Lipid panel  -Repeat lipid panel pending now that he has been taking Lipitor. Tolerating Lipitor without difficulty, continue same dose for now.  Follow-up office visit 3 months  No orders of the defined types were placed in this encounter.   Patient Instructions  No change in medicines for now. Good job on your diabetes! Keep a record of your blood pressures outside of the office and they run over 140/90 - return for possible new meds.   See information on handout and below for sleep difficulty. Ok to continue trazodone for now, but if this persists next few months - return to  discuss other workup or treatment for insomnia as your sleep study looked ok.   Insomnia Insomnia is frequent trouble falling and/or staying asleep. Insomnia can be a long term problem or a short term problem. Both are common. Insomnia can be a short term problem when the wakefulness is related to a certain stress or worry. Long term insomnia is often related to ongoing stress during waking hours and/or poor sleeping habits. Overtime, sleep deprivation itself can make the problem worse. Every little thing feels more severe because you are overtired and your ability to cope is decreased. CAUSES   Stress, anxiety, and depression.  Poor sleeping habits.  Distractions such as TV in the bedroom.  Naps close to bedtime.  Engaging in emotionally charged conversations before bed.  Technical reading before sleep.  Alcohol and other sedatives. They may make the problem worse. They can hurt normal sleep patterns and normal dream activity.  Stimulants such as caffeine for several hours prior to bedtime.  Pain syndromes and shortness of breath can cause insomnia.  Exercise late at night.  Changing time zones may cause sleeping problems (jet lag). It is sometimes helpful to have  someone observe your sleeping patterns. They should look for periods of not breathing during the night (sleep apnea). They should also look to see how long those periods last. If you live alone or observers are uncertain, you can also be observed at a sleep clinic where your sleep patterns will be professionally monitored. Sleep apnea requires a checkup and treatment. Give your caregivers your medical history. Give your caregivers observations your family has made about your sleep.  SYMPTOMS   Not feeling rested in the morning.  Anxiety and restlessness at bedtime.  Difficulty falling and staying asleep. TREATMENT   Your caregiver may prescribe treatment for an underlying medical disorders. Your caregiver can give advice or help if you are using alcohol or other drugs for self-medication. Treatment of underlying problems will usually eliminate insomnia problems.  Medications can be prescribed for short time use. They are generally not recommended for lengthy use.  Over-the-counter sleep medicines are not recommended for lengthy use. They can be habit forming.  You can promote easier sleeping by making lifestyle changes such as:  Using relaxation techniques that help with breathing and reduce muscle tension.  Exercising earlier in the day.  Changing your diet and the time of your last meal. No night time snacks.  Establish a regular time to go to bed.  Counseling can help with stressful problems and worry.  Soothing music and white noise may be helpful if there are background noises you cannot remove.  Stop tedious detailed work at least one hour before bedtime. HOME CARE INSTRUCTIONS   Keep a diary. Inform your caregiver about your progress. This includes any medication side effects. See your caregiver regularly. Take note of:  Times when you are asleep.  Times when you are awake during the night.  The quality of your sleep.  How you feel the next day. This information will  help your caregiver care for you.  Get out of bed if you are still awake after 15 minutes. Read or do some quiet activity. Keep the lights down. Wait until you feel sleepy and go back to bed.  Keep regular sleeping and waking hours. Avoid naps.  Exercise regularly.  Avoid distractions at bedtime. Distractions include watching television or engaging in any intense or detailed activity like attempting to balance the household checkbook.  Develop a bedtime  ritual. Keep a familiar routine of bathing, brushing your teeth, climbing into bed at the same time each night, listening to soothing music. Routines increase the success of falling to sleep faster.  Use relaxation techniques. This can be using breathing and muscle tension release routines. It can also include visualizing peaceful scenes. You can also help control troubling or intruding thoughts by keeping your mind occupied with boring or repetitive thoughts like the old concept of counting sheep. You can make it more creative like imagining planting one beautiful flower after another in your backyard garden.  During your day, work to eliminate stress. When this is not possible use some of the previous suggestions to help reduce the anxiety that accompanies stressful situations. MAKE SURE YOU:   Understand these instructions.  Will watch your condition.  Will get help right away if you are not doing well or get worse. Document Released: 07/18/2000 Document Revised: 10/13/2011 Document Reviewed: 08/18/2007 [facility] Patient Information 2015 [place], [place]. This information is not intended to replace advice given to you by your health care provider. Make sure you discuss any questions you have with your health care provider.       I personally performed the services described in this documentation, which was scribed in my presence. The recorded information has been reviewed and considered, and addended by me as needed.

## 2015-03-19 NOTE — Patient Instructions (Signed)
No change in medicines for now. Good job on your diabetes! Keep a record of your blood pressures outside of the office and they run over 140/90 - return for possible new meds.   See information on handout and below for sleep difficulty. Ok to continue trazodone for now, but if this persists next few months - return to discuss other workup or treatment for insomnia as your sleep study looked ok.   Insomnia Insomnia is frequent trouble falling and/or staying asleep. Insomnia can be a long term problem or a short term problem. Both are common. Insomnia can be a short term problem when the wakefulness is related to a certain stress or worry. Long term insomnia is often related to ongoing stress during waking hours and/or poor sleeping habits. Overtime, sleep deprivation itself can make the problem worse. Every little thing feels more severe because you are overtired and your ability to cope is decreased. CAUSES   Stress, anxiety, and depression.  Poor sleeping habits.  Distractions such as TV in the bedroom.  Naps close to bedtime.  Engaging in emotionally charged conversations before bed.  Technical reading before sleep.  Alcohol and other sedatives. They may make the problem worse. They can hurt normal sleep patterns and normal dream activity.  Stimulants such as caffeine for several hours prior to bedtime.  Pain syndromes and shortness of breath can cause insomnia.  Exercise late at night.  Changing time zones may cause sleeping problems (jet lag). It is sometimes helpful to have someone observe your sleeping patterns. They should look for periods of not breathing during the night (sleep apnea). They should also look to see how long those periods last. If you live alone or observers are uncertain, you can also be observed at a sleep clinic where your sleep patterns will be professionally monitored. Sleep apnea requires a checkup and treatment. Give your caregivers your medical history.  Give your caregivers observations your family has made about your sleep.  SYMPTOMS   Not feeling rested in the morning.  Anxiety and restlessness at bedtime.  Difficulty falling and staying asleep. TREATMENT   Your caregiver may prescribe treatment for an underlying medical disorders. Your caregiver can give advice or help if you are using alcohol or other drugs for self-medication. Treatment of underlying problems will usually eliminate insomnia problems.  Medications can be prescribed for short time use. They are generally not recommended for lengthy use.  Over-the-counter sleep medicines are not recommended for lengthy use. They can be habit forming.  You can promote easier sleeping by making lifestyle changes such as:  Using relaxation techniques that help with breathing and reduce muscle tension.  Exercising earlier in the day.  Changing your diet and the time of your last meal. No night time snacks.  Establish a regular time to go to bed.  Counseling can help with stressful problems and worry.  Soothing music and white noise may be helpful if there are background noises you cannot remove.  Stop tedious detailed work at least one hour before bedtime. HOME CARE INSTRUCTIONS   Keep a diary. Inform your caregiver about your progress. This includes any medication side effects. See your caregiver regularly. Take note of:  Times when you are asleep.  Times when you are awake during the night.  The quality of your sleep.  How you feel the next day. This information will help your caregiver care for you.  Get out of bed if you are still awake after 15 minutes. Read  or do some quiet activity. Keep the lights down. Wait until you feel sleepy and go back to bed.  Keep regular sleeping and waking hours. Avoid naps.  Exercise regularly.  Avoid distractions at bedtime. Distractions include watching television or engaging in any intense or detailed activity like attempting to  balance the household checkbook.  Develop a bedtime ritual. Keep a familiar routine of bathing, brushing your teeth, climbing into bed at the same time each night, listening to soothing music. Routines increase the success of falling to sleep faster.  Use relaxation techniques. This can be using breathing and muscle tension release routines. It can also include visualizing peaceful scenes. You can also help control troubling or intruding thoughts by keeping your mind occupied with boring or repetitive thoughts like the old concept of counting sheep. You can make it more creative like imagining planting one beautiful flower after another in your backyard garden.  During your day, work to eliminate stress. When this is not possible use some of the previous suggestions to help reduce the anxiety that accompanies stressful situations. MAKE SURE YOU:   Understand these instructions.  Will watch your condition.  Will get help right away if you are not doing well or get worse. Document Released: 07/18/2000 Document Revised: 10/13/2011 Document Reviewed: 08/18/2007 [facility] Patient Information 2015 [place], [place]. This information is not intended to replace advice given to you by your health care provider. Make sure you discuss any questions you have with your health care provider.

## 2015-03-20 LAB — COMPLETE METABOLIC PANEL WITH GFR
ALT: 16 U/L (ref 9–46)
AST: 16 U/L (ref 10–35)
Albumin: 4.2 g/dL (ref 3.6–5.1)
Alkaline Phosphatase: 60 U/L (ref 40–115)
BUN: 19 mg/dL (ref 7–25)
CO2: 28 mmol/L (ref 20–31)
CREATININE: 0.77 mg/dL (ref 0.70–1.25)
Calcium: 9.6 mg/dL (ref 8.6–10.3)
Chloride: 107 mmol/L (ref 98–110)
GFR, Est African American: >89 mL/min (ref >=60)
GFR, Est Non African American: >89 mL/min (ref >=60)
Glucose, Bld: 143 mg/dL — ABNORMAL HIGH (ref 65–99)
Potassium: 4.4 mmol/L (ref 3.5–5.3)
SODIUM: 142 mmol/L (ref 135–146)
Total Bilirubin: 2.1 mg/dL — ABNORMAL HIGH (ref 0.2–1.2)
Total Protein: 6.5 g/dL (ref 6.1–8.1)

## 2015-03-20 LAB — LIPID PANEL
Cholesterol: 119 mg/dL — ABNORMAL LOW (ref 125–200)
HDL: 41 mg/dL (ref >=40)
LDL CALC: 61 mg/dL (ref <130)
Total CHOL/HDL Ratio: 2.9 Ratio (ref <=5.0)
Triglycerides: 85 mg/dL (ref <150)
VLDL: 17 mg/dL (ref <30)

## 2015-03-28 ENCOUNTER — Telehealth: Payer: Self-pay

## 2015-03-28 NOTE — Telephone Encounter (Signed)
Pt calling about labs. Please review. Thanks  

## 2015-04-02 ENCOUNTER — Ambulatory Visit (INDEPENDENT_AMBULATORY_CARE_PROVIDER_SITE_OTHER): Payer: Medicare Other | Admitting: Emergency Medicine

## 2015-04-02 VITALS — BP 120/54 | HR 55 | Temp 97.7°F | Resp 12 | Ht 65.75 in | Wt 182.6 lb

## 2015-04-02 DIAGNOSIS — Z23 Encounter for immunization: Secondary | ICD-10-CM

## 2015-04-02 DIAGNOSIS — S90852A Superficial foreign body, left foot, initial encounter: Secondary | ICD-10-CM

## 2015-04-02 DIAGNOSIS — M79672 Pain in left foot: Secondary | ICD-10-CM

## 2015-04-02 NOTE — Patient Instructions (Signed)

## 2015-04-02 NOTE — Progress Notes (Signed)
Subjective:  Patient ID: William Oneill, male    DOB: 1945-10-05  Age: 69 y.o. MRN: [id number]  CC: Foot Pain and Labs Only   HPI ZAION HREHA presents  he has pain in his ball of his left foot since yesterday. He has no history of injury. Has no swelling or redness. Marked tenderness is unable to bear weight. He is type II diabetic. He said no improvement with over-the-counter medication.  History Wynter has a past medical history of Insomnia; Diabetes mellitus without complication; and High cholesterol.   He has no past surgical history on file.   His  family history includes Diabetes in his sister; Heart attack in his father; Heart disease in his father and sister; Stroke in his mother.  He   reports that he has never smoked. He does not have any smokeless tobacco history on file. He reports that he does not drink alcohol or use illicit drugs.  Outpatient Prescriptions Prior to Visit  Medication Sig Dispense Refill  . aspirin 81 MG tablet Take 81 mg by mouth daily.    Marland Kitchen atorvastatin (LIPITOR) 10 MG tablet Take 1 tablet (10 mg total) by mouth daily. 90 tablet 3  . blood glucose meter kit and supplies Dispense based on patient and insurance preference. Use up to four times daily as directed. (FOR ICD-9 250.00, 250.01). 1 each 0  . metFORMIN (GLUCOPHAGE) 500 MG tablet Take 1 tablet (500 mg total) by mouth 2 (two) times daily with a meal. 180 tablet 3  . Multiple Vitamin (MULTIVITAMIN WITH MINERALS) TABS tablet Take 1 tablet by mouth daily.    . traZODone (DESYREL) 50 MG tablet Take 2 tablets (100 mg total) by mouth at bedtime. 60 tablet 2  . ipratropium (ATROVENT) 0.03 % nasal spray Place 2 sprays into both nostrils 2 (two) times daily. (Patient not taking: Reported on 04/02/2015) 30 mL 0   No facility-administered medications prior to visit.    Social History   Social History  . Marital Status: Married    Spouse Name: N/A  . Number of Children: N/A  . Years of Education:  N/A   Social History Main Topics  . Smoking status: Never Smoker   . Smokeless tobacco: None  . Alcohol Use: No  . Drug Use: No  . Sexual Activity: Not Asked   Other Topics Concern  . None   Social History Narrative     Review of Systems  Constitutional: Negative for fever, chills and appetite change.  HENT: Negative for congestion, ear pain, postnasal drip, sinus pressure and sore throat.   Eyes: Negative for pain and redness.  Respiratory: Negative for cough, shortness of breath and wheezing.   Cardiovascular: Negative for leg swelling.  Gastrointestinal: Negative for nausea, vomiting, abdominal pain, diarrhea, constipation and blood in stool.  Endocrine: Negative for polyuria.  Genitourinary: Negative for dysuria, urgency, frequency and flank pain.  Musculoskeletal: Negative for gait problem.  Skin: Negative for rash.  Neurological: Negative for weakness and headaches.  Psychiatric/Behavioral: Negative for confusion and decreased concentration. The patient is not nervous/anxious.     Objective:  BP 120/54 mmHg  Pulse 55  Temp(Src) 97.7 F (36.5 C) (Oral)  Resp 12  Ht 5' 5.75" (1.67 m)  Wt 182 lb 9.6 oz (82.827 kg)  BMI 29.70 kg/m2  SpO2 98%  Physical Exam  Constitutional: He is oriented to person, place, and time. He appears well-developed and well-nourished.  HENT:  Head: Normocephalic and atraumatic.  Eyes: Conjunctivae  are normal. Pupils are equal, round, and reactive to light.  Pulmonary/Chest: Effort normal.  Musculoskeletal: He exhibits no edema.  Neurological: He is alert and oriented to person, place, and time.  Skin: Skin is dry.  Psychiatric: He has a normal mood and affect. His behavior is normal. Thought content normal.   He has metallic foreign body in the ball of his foot. No evidence of any infection   Assessment & Plan:   Benson was seen today for foot pain and labs only.  Diagnoses and all orders for this visit:  Foreign body in foot,  left, initial encounter  Left foot pain  Need for Tdap vaccination  Other orders -     Tdap vaccine greater than or equal to 7yo IM   I am having Mr. Harn maintain his aspirin, multivitamin with minerals, metFORMIN, blood glucose meter kit and supplies, atorvastatin, ipratropium, and traZODone.  No orders of the defined types were placed in this encounter.   Metallic foreign body was removed atraumatically. With relief of symptoms  Appropriate red flag conditions were discussed with the patient as well as actions that should be taken.  Patient expressed his understanding.  Follow-up: Return if symptoms worsen or fail to improve.  Roselee Culver, MD

## 2015-04-10 NOTE — Telephone Encounter (Signed)
See labs 

## 2015-04-11 ENCOUNTER — Observation Stay (HOSPITAL_COMMUNITY)
Admission: EM | Admit: 2015-04-11 | Discharge: 2015-04-13 | Disposition: A | Discharge status: Home / Self Care | Payer: Medicare Other | Attending: Internal Medicine | Admitting: Internal Medicine

## 2015-04-11 ENCOUNTER — Encounter (HOSPITAL_COMMUNITY): Payer: Self-pay | Admitting: Emergency Medicine

## 2015-04-11 ENCOUNTER — Emergency Department (HOSPITAL_COMMUNITY): Payer: Medicare Other

## 2015-04-11 DIAGNOSIS — J208 Acute bronchitis due to other specified organisms: Secondary | ICD-10-CM | POA: Diagnosis present

## 2015-04-11 DIAGNOSIS — E119 Type 2 diabetes mellitus without complications: Secondary | ICD-10-CM | POA: Diagnosis not present

## 2015-04-11 DIAGNOSIS — J069 Acute upper respiratory infection, unspecified: Secondary | ICD-10-CM

## 2015-04-11 DIAGNOSIS — W06XXXA Fall from bed, initial encounter: Secondary | ICD-10-CM | POA: Insufficient documentation

## 2015-04-11 DIAGNOSIS — E785 Hyperlipidemia, unspecified: Secondary | ICD-10-CM | POA: Insufficient documentation

## 2015-04-11 DIAGNOSIS — Z79899 Other long term (current) drug therapy: Secondary | ICD-10-CM | POA: Insufficient documentation

## 2015-04-11 DIAGNOSIS — J209 Acute bronchitis, unspecified: Secondary | ICD-10-CM | POA: Diagnosis not present

## 2015-04-11 DIAGNOSIS — D72829 Elevated white blood cell count, unspecified: Secondary | ICD-10-CM | POA: Diagnosis not present

## 2015-04-11 DIAGNOSIS — R000 Tachycardia, unspecified: Secondary | ICD-10-CM | POA: Insufficient documentation

## 2015-04-11 DIAGNOSIS — R531 Weakness: Secondary | ICD-10-CM

## 2015-04-11 DIAGNOSIS — Y92009 Unspecified place in unspecified non-institutional (private) residence as the place of occurrence of the external cause: Secondary | ICD-10-CM | POA: Diagnosis not present

## 2015-04-11 DIAGNOSIS — E780 Pure hypercholesterolemia: Secondary | ICD-10-CM | POA: Insufficient documentation

## 2015-04-11 DIAGNOSIS — R17 Unspecified jaundice: Secondary | ICD-10-CM | POA: Diagnosis not present

## 2015-04-11 DIAGNOSIS — E860 Dehydration: Secondary | ICD-10-CM | POA: Diagnosis not present

## 2015-04-11 DIAGNOSIS — Z7982 Long term (current) use of aspirin: Secondary | ICD-10-CM | POA: Insufficient documentation

## 2015-04-11 DIAGNOSIS — R42 Dizziness and giddiness: Secondary | ICD-10-CM | POA: Insufficient documentation

## 2015-04-11 DIAGNOSIS — G4700 Insomnia, unspecified: Secondary | ICD-10-CM | POA: Insufficient documentation

## 2015-04-11 LAB — GLUCOSE, CAPILLARY
GLUCOSE-CAPILLARY: 116 mg/dL — AB (ref 65–99)
GLUCOSE-CAPILLARY: 162 mg/dL — AB (ref 65–99)
Glucose-Capillary: 205 mg/dL — ABNORMAL HIGH (ref 65–99)

## 2015-04-11 LAB — URINALYSIS, ROUTINE W REFLEX MICROSCOPIC
BILIRUBIN URINE: NEGATIVE
Glucose, UA: 250 mg/dL — AB
KETONES UR: 15 mg/dL — AB
Leukocytes, UA: NEGATIVE
Nitrite: NEGATIVE
Protein, ur: 30 mg/dL — AB
SPECIFIC GRAVITY, URINE: 1.023 (ref 1.005–1.030)
UROBILINOGEN UA: 4 mg/dL — AB (ref 0.0–1.0)
pH: 6 (ref 5.0–8.0)

## 2015-04-11 LAB — CBC WITH DIFFERENTIAL/PLATELET
Basophils Absolute: 0.1 10*3/uL (ref 0.0–0.1)
Basophils Relative: 0 % (ref 0–1)
EOS PCT: 0 % (ref 0–5)
Eosinophils Absolute: 0.1 10*3/uL (ref 0.0–0.7)
HEMATOCRIT: 39.6 % (ref 39.0–52.0)
Hemoglobin: 13.7 g/dL (ref 13.0–17.0)
LYMPHS ABS: 1.6 10*3/uL (ref 0.7–4.0)
LYMPHS PCT: 11 % — AB (ref 12–46)
MCH: 30.9 pg (ref 26.0–34.0)
MCHC: 34.6 g/dL (ref 30.0–36.0)
MCV: 89.2 fL (ref 78.0–100.0)
MONO ABS: 1.9 10*3/uL — AB (ref 0.1–1.0)
MONOS PCT: 12 % (ref 3–12)
NEUTROS ABS: 11.9 10*3/uL — AB (ref 1.7–7.7)
Neutrophils Relative %: 77 % (ref 43–77)
PLATELETS: 241 10*3/uL (ref 150–400)
RBC: 4.44 MIL/uL (ref 4.22–5.81)
RDW: 12.7 % (ref 11.5–15.5)
WBC: 15.5 10*3/uL — ABNORMAL HIGH (ref 4.0–10.5)

## 2015-04-11 LAB — COMPREHENSIVE METABOLIC PANEL
ALT: 23 U/L (ref 17–63)
ANION GAP: 8 (ref 5–15)
AST: 30 U/L (ref 15–41)
Albumin: 4.1 g/dL (ref 3.5–5.0)
Alkaline Phosphatase: 79 U/L (ref 38–126)
BILIRUBIN TOTAL: 2.7 mg/dL — AB (ref 0.3–1.2)
BUN: 21 mg/dL — AB (ref 6–20)
CHLORIDE: 105 mmol/L (ref 101–111)
CO2: 25 mmol/L (ref 22–32)
Calcium: 8.6 mg/dL — ABNORMAL LOW (ref 8.9–10.3)
Creatinine, Ser: 0.92 mg/dL (ref 0.61–1.24)
Glucose, Bld: 189 mg/dL — ABNORMAL HIGH (ref 65–99)
POTASSIUM: 3.6 mmol/L (ref 3.5–5.1)
Sodium: 138 mmol/L (ref 135–145)
TOTAL PROTEIN: 6.8 g/dL (ref 6.5–8.1)

## 2015-04-11 LAB — URINE MICROSCOPIC-ADD ON

## 2015-04-11 LAB — I-STAT TROPONIN, ED: TROPONIN I, POC: 0.04 ng/mL (ref 0.00–0.08)

## 2015-04-11 LAB — STREP PNEUMONIAE URINARY ANTIGEN: Strep Pneumo Urinary Antigen: NEGATIVE

## 2015-04-11 LAB — CBG MONITORING, ED: GLUCOSE-CAPILLARY: 161 mg/dL — AB (ref 65–99)

## 2015-04-11 MED ORDER — LEVOFLOXACIN IN D5W 500 MG/100ML IV SOLN
500.0000 mg | Freq: Once | INTRAVENOUS | Status: AC
  Administered 2015-04-11: 500 mg via INTRAVENOUS
  Filled 2015-04-11: qty 100

## 2015-04-11 MED ORDER — AZITHROMYCIN 250 MG PO TABS: 250.0000 mg | ORAL_TABLET | Freq: Every day | ORAL | Status: DC

## 2015-04-11 MED ORDER — SODIUM CHLORIDE 0.9 % IV BOLUS (SEPSIS)
500.0000 mL | Freq: Once | INTRAVENOUS | Status: AC
  Administered 2015-04-11: 500 mL via INTRAVENOUS

## 2015-04-11 MED ORDER — GUAIFENESIN-DM 100-10 MG/5ML PO SYRP
5.0000 mL | ORAL_SOLUTION | ORAL | Status: DC | PRN
  Administered 2015-04-11 – 2015-04-12 (×2): 5 mL via ORAL
  Filled 2015-04-11 (×3): qty 10

## 2015-04-11 MED ORDER — ADULT MULTIVITAMIN W/MINERALS CH
1.0000 | ORAL_TABLET | Freq: Every day | ORAL | Status: DC
  Administered 2015-04-11 – 2015-04-13 (×3): 1 via ORAL
  Filled 2015-04-11 (×3): qty 1

## 2015-04-11 MED ORDER — ALBUTEROL SULFATE (2.5 MG/3ML) 0.083% IN NEBU: 2.5000 mg | INHALATION_SOLUTION | RESPIRATORY_TRACT | Status: DC | PRN

## 2015-04-11 MED ORDER — BENZONATATE 100 MG PO CAPS: 100.0000 mg | ORAL_CAPSULE | Freq: Three times a day (TID) | ORAL | Status: DC | PRN

## 2015-04-11 MED ORDER — SODIUM CHLORIDE 0.9 % IV SOLN
INTRAVENOUS | Status: DC
  Administered 2015-04-11: 09:00:00 via INTRAVENOUS

## 2015-04-11 MED ORDER — GUAIFENESIN ER 600 MG PO TB12
1200.0000 mg | ORAL_TABLET | Freq: Two times a day (BID) | ORAL | Status: DC
  Administered 2015-04-11 – 2015-04-13 (×5): 1200 mg via ORAL
  Filled 2015-04-11 (×5): qty 2

## 2015-04-11 MED ORDER — TRAZODONE HCL 50 MG PO TABS
100.0000 mg | ORAL_TABLET | Freq: Every day | ORAL | Status: DC
  Administered 2015-04-11 – 2015-04-12 (×2): 100 mg via ORAL
  Filled 2015-04-11 (×2): qty 2

## 2015-04-11 MED ORDER — SODIUM CHLORIDE 0.9 % IV SOLN
INTRAVENOUS | Status: AC
  Administered 2015-04-11 – 2015-04-12 (×2): via INTRAVENOUS

## 2015-04-11 MED ORDER — ACETAMINOPHEN 325 MG PO TABS: 650.0000 mg | ORAL_TABLET | Freq: Four times a day (QID) | ORAL | Status: DC | PRN

## 2015-04-11 MED ORDER — ASPIRIN EC 81 MG PO TBEC
81.0000 mg | DELAYED_RELEASE_TABLET | Freq: Every day | ORAL | Status: DC
  Administered 2015-04-11 – 2015-04-13 (×3): 81 mg via ORAL
  Filled 2015-04-11 (×3): qty 1

## 2015-04-11 MED ORDER — INSULIN ASPART 100 UNIT/ML ~~LOC~~ SOLN
0.0000 [IU] | Freq: Three times a day (TID) | SUBCUTANEOUS | Status: DC
  Administered 2015-04-11: 2 [IU] via SUBCUTANEOUS
  Administered 2015-04-12: 1 [IU] via SUBCUTANEOUS
  Administered 2015-04-12 – 2015-04-13 (×2): 2 [IU] via SUBCUTANEOUS
  Administered 2015-04-13: 1 [IU] via SUBCUTANEOUS
  Filled 2015-04-11: qty 1

## 2015-04-11 MED ORDER — LEVOFLOXACIN IN D5W 750 MG/150ML IV SOLN
750.0000 mg | INTRAVENOUS | Status: DC
Start: 2015-04-12 — End: 2015-04-13
  Administered 2015-04-12 – 2015-04-13 (×2): 750 mg via INTRAVENOUS
  Filled 2015-04-11 (×2): qty 150

## 2015-04-11 MED ORDER — ACETAMINOPHEN 650 MG RE SUPP: 650.0000 mg | Freq: Four times a day (QID) | RECTAL | Status: DC | PRN

## 2015-04-11 MED ORDER — PROMETHAZINE HCL 25 MG PO TABS: 12.5000 mg | ORAL_TABLET | Freq: Four times a day (QID) | ORAL | Status: DC | PRN

## 2015-04-11 MED ORDER — ENOXAPARIN SODIUM 40 MG/0.4ML ~~LOC~~ SOLN
40.0000 mg | SUBCUTANEOUS | Status: DC
  Administered 2015-04-11 – 2015-04-12 (×2): 40 mg via SUBCUTANEOUS
  Filled 2015-04-11 (×2): qty 0.4

## 2015-04-11 MED ORDER — ATORVASTATIN CALCIUM 10 MG PO TABS
10.0000 mg | ORAL_TABLET | Freq: Every day | ORAL | Status: DC
  Administered 2015-04-11 – 2015-04-13 (×3): 10 mg via ORAL
  Filled 2015-04-11 (×3): qty 1

## 2015-04-11 MED ORDER — SODIUM CHLORIDE 0.9 % IJ SOLN
3.0000 mL | Freq: Two times a day (BID) | INTRAMUSCULAR | Status: DC
  Administered 2015-04-11 – 2015-04-12 (×3): 3 mL via INTRAVENOUS

## 2015-04-11 NOTE — ED Notes (Signed)
MD at bedside. 

## 2015-04-11 NOTE — ED Notes (Signed)
Pt can go to floor at 13:17.William Oneill

## 2015-04-11 NOTE — ED Notes (Signed)
Patient ambulated with pulse ox- stayed between 95%-97%.

## 2015-04-11 NOTE — H&P (Signed)
History and Physical  William Oneill DOB: Apr 19, 1946 DOA: 04/11/2015  Referring physician: Dr. Julianne Rice, EDP PCP: [name], MD  Outpatient Specialists:  1. None  Chief Complaint: Generalized body aches, generalized weakness, sore throat, productive cough, low-grade fevers and a fall at home.  HPI: William Oneill is a 69 y.o. male single, lives with a 18 year old family member that he helps take care of, independent of activities of daily living, PMH of DM 2, HLD, insomnia, lifelong nonsmoker, presented to the [facility] ED on 04/11/15 with above complaints. He was in his usual state of health until 3 days ago when he started experiencing generalized body aches, generalized weakness, low-grade fevers (did not check), sore throat, cough productive of green sputum but denies headache, earache, dyspnea, chest pain, palpitations. Had an episode of nausea and nonbloody emesis last night. Appetite significantly decreased. No abdominal pain, diarrhea, urinary symptoms. He also complains of intermittent dizziness on upright position. Last night while attempting to get into bed, slid off the bed onto the floor. Denies LOC or injuries. Denies hitting his head. He was unable to get up due to weakness and his 68 year old relative called her grandson who came by and helped him up into bed. In the ED, afebrile, mild sinus tachycardia on arrival which is improved, lab work significant for glucose 189, WBC 15.5, chest x-ray without acute cardiopulmonary disease. Hospitalist admission was requested for acute purulent bronchitis, ruling out developing pneumonia, generalized weakness related to acute illness and a fall.   Review of Systems: All systems reviewed and apart from history of presenting illness, are negative.  Past Medical History  Diagnosis Date  . Insomnia   . Diabetes mellitus without complication   . High cholesterol    History reviewed. No pertinent past  surgical history. Social History:  reports that he has never smoked. He does not have any smokeless tobacco history on file. He reports that he does not drink alcohol or use illicit drugs. As per history of present illness.  No Known Allergies  Family History  Problem Relation Age of Onset  . Heart disease Father   . Heart attack Father   . Heart disease Sister   . Diabetes Sister   . Stroke Mother     Prior to Admission medications   Medication Sig Start Date End Date Taking? Authorizing Provider  aspirin EC 81 MG tablet Take 81 mg by mouth daily.   Yes Historical Provider, MD  atorvastatin (LIPITOR) 10 MG tablet Take 1 tablet (10 mg total) by mouth daily. 12/21/14  Yes [name], MD  blood glucose meter kit and supplies Dispense based on patient and insurance preference. Use up to four times daily as directed. (FOR ICD-9 250.00, 250.01). 12/11/14  Yes [name], MD  metFORMIN (GLUCOPHAGE) 500 MG tablet Take 1 tablet (500 mg total) by mouth 2 (two) times daily with a meal. 09/04/14  Yes Thao P Le, DO  Multiple Vitamin (MULTIVITAMIN WITH MINERALS) TABS tablet Take 1 tablet by mouth daily.   Yes Historical Provider, MD  traZODone (DESYREL) 50 MG tablet Take 2 tablets (100 mg total) by mouth at bedtime. 01/09/15  Yes Dorian Heckle English, PA  azithromycin (ZITHROMAX) 250 MG tablet Take 1 tablet (250 mg total) by mouth daily. Take first 2 tablets together, then 1 every day until finished. 04/11/15   Julianne Rice, MD  benzonatate (TESSALON) 100 MG capsule Take 1 capsule (100 mg total) by mouth 3 (  three) times daily as needed for cough. 04/11/15   Julianne Rice, MD   Physical Exam: Filed Vitals:   04/11/15 0538 04/11/15 0800  BP: 132/53 130/62  Pulse: 105 94  Temp: 98.9 F (37.2 C)   TempSrc: Oral   Resp: 15 18  SpO2: 94% 94%     General exam: Moderately built and nourished pleasant middle-aged male patient, lying comfortably supine on the gurney in no obvious distress. Does  not look septic or toxic.  Head, eyes and ENT: Nontraumatic and normocephalic. Pupils equally reacting to light and accommodation. Oral mucosa dry. Throat mildly congested but without drainage. Bilateral immature cataracts.  Neck: Supple. No JVD, carotid bruit or thyromegaly.  Lymphatics: No lymphadenopathy.  Respiratory system: Clear to auscultation. No increased work of breathing.  Cardiovascular system: S1 and S2 heard, RRR. No JVD, murmurs, gallops, clicks or pedal edema.  Gastrointestinal system: Abdomen is nondistended, soft and nontender. Normal bowel sounds heard. No organomegaly or masses appreciated.  Central nervous system: Alert and oriented. No focal neurological deficits.  Extremities: Symmetric 5 x 5 power. Peripheral pulses symmetrically felt.   Skin: No rashes or acute findings.  Musculoskeletal system: Negative exam.  Psychiatry: Pleasant and cooperative.   Labs on Admission:  Basic Metabolic Panel:  Recent Labs Lab 04/11/15 0556  NA 138  K 3.6  CL 105  CO2 25  GLUCOSE 189*  BUN 21*  CREATININE 0.92  CALCIUM 8.6*   Liver Function Tests:  Recent Labs Lab 04/11/15 0556  AST 30  ALT 23  ALKPHOS 79  BILITOT 2.7*  PROT 6.8  ALBUMIN 4.1   No results for input(s): LIPASE, AMYLASE in the last 168 hours. No results for input(s): AMMONIA in the last 168 hours. CBC:  Recent Labs Lab 04/11/15 0556  WBC 15.5*  NEUTROABS 11.9*  HGB 13.7  HCT 39.6  MCV 89.2  PLT 241   Cardiac Enzymes: No results for input(s): CKTOTAL, CKMB, CKMBINDEX, TROPONINI in the last 168 hours.  BNP (last 3 results) No results for input(s): PROBNP in the last 8760 hours. CBG: No results for input(s): GLUCAP in the last 168 hours.  Radiological Exams on Admission: Dg Chest 2 View  04/11/2015   CLINICAL DATA:  Cough, nausea and vomiting, increased weakness over last 2 days.  EXAM: CHEST  2 VIEW  COMPARISON:  05/28/2014  FINDINGS: The heart size and mediastinal contours  are within normal limits. Both lungs are clear. The visualized skeletal structures are unremarkable.  IMPRESSION: No active cardiopulmonary disease.   Electronically Signed   By: Lucienne Capers M.D.   On: 04/11/2015 06:11    EKG: Independently reviewed. Sinus rhythm at 91 bpm, normal axis, nonspecific ST-T changes, no acute changes, QTC 450 ms.  Assessment/Plan Principal Problem:   Acute purulent bronchitis Active Problems:   Type 2 diabetes mellitus without complication   Generalized weakness   Dehydration   Acute purulent bronchitis - Rule out developing pneumonia - Continue levofloxacin that was started in the ED - Blood cultures if patient spikes fever, check HIV antibody, sputum culture, urine Legionella and streptococcal antigen - We'll follow repeat chest x-ray 9/8 to rule out developing community-acquired pneumonia  Dehydration - Secondary to acute illness and poor oral intake. Brief IV fluids.  Generalized weakness - Secondary to acute illness and poor oral intake. Treat as above. PT evaluation.  Mechanical fall - Secondary to generalized weakness. Check orthostatic blood pressures. Treat acute illness. PT evaluation.  Leukocytosis - Secondary to acute illness.  Management as above. Follow CBCs  Type II DM without complications - Hold metformin. Place on NovoLog SSI.  Hyperlipidemia - Continue statins  Insomnia - Trazodone dose recently increased. Current presentation less likely due to medication change. Continue home dose and monitor.    DVT prophylaxis: Lovenox  Code Status: Full  Family Communication: None at bedside  Disposition Plan: DC home when medically stable, possibly 9/8 if no pneumonia.   Time spent: 105 minutes  Willoughby Doell, MD, FACP, FHM. [facility] Pager [phone]  If 7PM-7AM, please contact night-coverage [email or web address] Password [facility] 04/11/2015, 8:47 AM

## 2015-04-11 NOTE — ED Provider Notes (Signed)
CSN: [id number]     Arrival date & time 04/11/15  0532 History   First MD Initiated Contact with Patient 04/11/15 [phone]     Chief Complaint  Patient presents with  . Weakness  . Emesis  . Cough     (Consider location/radiation/quality/duration/timing/severity/associated sxs/prior Treatment) HPI Patient presents with 2 days of cough productive of yellow sputum. He's also had generalized weakness and fatigue. States he tried to get out of bed last night became lightheaded and slid to the ground. Denies hitting his head or neck. Denies any chest pain or abdominal pain. One episode of nausea and vomiting. No diarrhea or constipation. Denies any urinary symptoms. States he's had some mild shortness of breath. No lower extremity swelling or pain. Patient does complain of episodic lower back pain but states he is pain-free at this time Past Medical History  Diagnosis Date  . Insomnia   . Diabetes mellitus without complication   . High cholesterol    History reviewed. No pertinent past surgical history. Family History  Problem Relation Age of Onset  . Heart disease Father   . Heart attack Father   . Heart disease Sister   . Diabetes Sister   . Stroke Mother    Social History  Substance Use Topics  . Smoking status: Never Smoker   . Smokeless tobacco: None  . Alcohol Use: No    Review of Systems  Constitutional: Positive for fever and fatigue. Negative for chills.  Respiratory: Positive for cough and shortness of breath.   Cardiovascular: Negative for chest pain, palpitations and leg swelling.  Gastrointestinal: Negative for nausea, vomiting and abdominal pain.  Genitourinary: Negative for dysuria, frequency, hematuria and flank pain.  Musculoskeletal: Positive for back pain. Negative for myalgias, neck pain and neck stiffness.  Skin: Negative for rash and wound.  Neurological: Positive for dizziness and light-headedness. Negative for tremors, syncope, weakness, numbness and  headaches.  All other systems reviewed and are negative.     Allergies  Review of patient's allergies indicates no known allergies.  Home Medications   Prior to Admission medications   Medication Sig Start Date End Date Taking? Authorizing Provider  aspirin EC 81 MG tablet Take 81 mg by mouth daily.   Yes Historical Provider, MD  atorvastatin (LIPITOR) 10 MG tablet Take 1 tablet (10 mg total) by mouth daily. 12/21/14  Yes [name], MD  blood glucose meter kit and supplies Dispense based on patient and insurance preference. Use up to four times daily as directed. (FOR ICD-9 250.00, 250.01). 12/11/14  Yes [name], MD  metFORMIN (GLUCOPHAGE) 500 MG tablet Take 1 tablet (500 mg total) by mouth 2 (two) times daily with a meal. 09/04/14  Yes Thao P Le, DO  Multiple Vitamin (MULTIVITAMIN WITH MINERALS) TABS tablet Take 1 tablet by mouth daily.   Yes Historical Provider, MD  traZODone (DESYREL) 50 MG tablet Take 2 tablets (100 mg total) by mouth at bedtime. 01/09/15  Yes Dorian Heckle English, PA  azithromycin (ZITHROMAX) 250 MG tablet Take 1 tablet (250 mg total) by mouth daily. Take first 2 tablets together, then 1 every day until finished. 04/11/15   Julianne Rice, MD  benzonatate (TESSALON) 100 MG capsule Take 1 capsule (100 mg total) by mouth 3 (three) times daily as needed for cough. 04/11/15   Julianne Rice, MD   BP 115/59 mmHg  Pulse 64  Temp(Src) 98.8 F (37.1 C) (Oral)  Resp 18  Ht _0  (1.702 m)  Wt  181 lb 10.5 oz (82.4 kg)  BMI 28.45 kg/m2  SpO2 96% Physical Exam  Constitutional: He is oriented to person, place, and time. He appears well-developed and well-nourished. No distress.  HENT:  Head: Normocephalic and atraumatic.  Mouth/Throat: Oropharynx is clear and moist. No oropharyngeal exudate.  Eyes: EOM are normal. Pupils are equal, round, and reactive to light.  Neck: Normal range of motion. Neck supple.  Tenderness to palpation.  Cardiovascular: Regular rhythm.    Tachycardia  Pulmonary/Chest: Effort normal and breath sounds normal. No respiratory distress. He has no wheezes. He has no rales. He exhibits no tenderness.  Abdominal: Soft. Bowel sounds are normal. He exhibits no distension and no mass. There is no tenderness. There is no rebound and no guarding.  Musculoskeletal: Normal range of motion. He exhibits no edema or tenderness.  No midline thoracic or lumbar tenderness. Lower extremity swelling or pain. Distal pulses bilaterally equal  Neurological: He is alert and oriented to person, place, and time.  5/5 motor in all extremities. Sensation fully intact. Unsteady gait  Skin: Skin is warm and dry. No rash noted. No erythema.  Psychiatric: He has a normal mood and affect. His behavior is normal.  Nursing note and vitals reviewed.   ED Course  Procedures (including critical care time) Labs Review Labs Reviewed  CBC WITH DIFFERENTIAL/PLATELET - Abnormal; Notable for the following:    WBC 15.5 (*)    Neutro Abs 11.9 (*)    Lymphocytes Relative 11 (*)    Monocytes Absolute 1.9 (*)    All other components within normal limits  COMPREHENSIVE METABOLIC PANEL - Abnormal; Notable for the following:    Glucose, Bld 189 (*)    BUN 21 (*)    Calcium 8.6 (*)    Total Bilirubin 2.7 (*)    All other components within normal limits  URINALYSIS, ROUTINE W REFLEX MICROSCOPIC (NOT AT [facility] - Abnormal; Notable for the following:    Color, Urine AMBER (*)    Glucose, UA 250 (*)    Hgb urine dipstick SMALL (*)    Ketones, ur 15 (*)    Protein, ur 30 (*)    Urobilinogen, UA 4.0 (*)    All other components within normal limits  GLUCOSE, CAPILLARY - Abnormal; Notable for the following:    Glucose-Capillary 116 (*)    All other components within normal limits  GLUCOSE, CAPILLARY - Abnormal; Notable for the following:    Glucose-Capillary 162 (*)    All other components within normal limits  CBC - Abnormal; Notable for the following:    WBC 12.7 (*)     All other components within normal limits  GLUCOSE, CAPILLARY - Abnormal; Notable for the following:    Glucose-Capillary 205 (*)    All other components within normal limits  CBG MONITORING, ED - Abnormal; Notable for the following:    Glucose-Capillary 161 (*)    All other components within normal limits  CULTURE, EXPECTORATED SPUTUM-ASSESSMENT  URINE MICROSCOPIC-ADD ON  STREP PNEUMONIAE URINARY ANTIGEN  HIV ANTIBODY (ROUTINE TESTING)  LEGIONELLA ANTIGEN, URINE  INFLUENZA PANEL BY PCR (TYPE A & B, H1N1)(NOT AT [facility])  I-STAT TROPOININ, ED    Imaging Review Dg Chest 2 View  04/11/2015   CLINICAL DATA:  Cough, nausea and vomiting, increased weakness over last 2 days.  EXAM: CHEST  2 VIEW  COMPARISON:  05/28/2014  FINDINGS: The heart size and mediastinal contours are within normal limits. Both lungs are clear. The visualized skeletal structures are  unremarkable.  IMPRESSION: No active cardiopulmonary disease.   Electronically Signed   By: Lucienne Capers M.D.   On: 04/11/2015 06:11   I have personally reviewed and evaluated these images and lab results as part of my medical decision-making.   EKG Interpretation   Date/Time:  Wednesday April 11 2015 07:56:53 EDT Ventricular Rate:  91 PR Interval:  176 QRS Duration: 99 QT Interval:  366 QTC Calculation: 450 R Axis:   56 Text Interpretation:  Sinus tachycardia Multiple premature complexes, vent   Abnormal R-wave progression, early transition Borderline ST depression,  diffuse leads ED PHYSICIAN INTERPRETATION AVAILABLE IN [facility]  Confirmed by TEST, Record ([id number]) on 04/12/2015 7:03:19 AM      MDM   Final diagnoses:  URI (upper respiratory infection)  Generalized weakness   Patient is to be symptomatically despite IV fluids. Irregular irregular rhythm. Questional possible atrial fibrillation. Chest x-ray without any acute findings. Elevation in white blood cell count. Patient likely has upper respiratory  infection.   Discussed with [facility] hospitalist and we'll admit  Julianne Rice, MD 04/12/15 [phone]

## 2015-04-11 NOTE — ED Notes (Signed)
Patient transported to X-ray 

## 2015-04-11 NOTE — ED Notes (Signed)
Pt reports cough with yellow sputum starting Monday. Has had associated weakness/fatigue since then. Fell last night but denies LOC and did not hit his head. Says he "slid out of bed" but when asked another time says, "yeah I got lightheaded." Denies chest pain or abdominal pain. Denies urinary changes. Takes Trazodone to sleep and says his PCP recently increased dose. Denies any other recent falls or symptoms that align with increased Trazodone dose. Endorses emesis x1 but denies diarrhea. Pt A&Ox4. Ambulatory with unsteady gait. Pt drove himself here today. RR even/unlabored.

## 2015-04-11 NOTE — Progress Notes (Signed)
ANTIBIOTIC CONSULT NOTE - INITIAL  Pharmacy Consult for Levaquin Indication: bronchitis  No Known Allergies  Patient Measurements: Height: 5\' 7"  (170.2 cm) Weight: 181 lb 14.1 oz (82.5 kg) IBW/kg (Calculated) : 66.1 Adjusted Body Weight:   Vital Signs: Temp: 99.9 F (37.7 C) (09/07 1346) Temp Source: Oral (09/07 1346) BP: 151/70 mmHg (09/07 1346) Pulse Rate: 93 (09/07 1346) Intake/Output from previous day:   Intake/Output from this shift:    Labs:  Recent Labs  04/11/15 0556  WBC 15.5*  HGB 13.7  PLT 241  CREATININE 0.92   Estimated Creatinine Clearance: 80.1 mL/min (by C-G formula based on Cr of 0.92). No results for input(s): VANCOTROUGH, VANCOPEAK, VANCORANDOM, GENTTROUGH, GENTPEAK, GENTRANDOM, TOBRATROUGH, TOBRAPEAK, TOBRARND, AMIKACINPEAK, AMIKACINTROU, AMIKACIN in the last 72 hours.   Microbiology: No results found for this or any previous visit (from the past 720 hour(s)).  Medical History: Past Medical History  Diagnosis Date  . Insomnia   . Diabetes mellitus without complication   . High cholesterol      Assessment: 68yoM with acute purulent bronchitis, pharmacy consulted to dose Levaquin.  Goal of Therapy:  Doses adjusted per renal function Eradication of infection  Plan:  Levaquin 750 mg IV q24h.  Hershal Coria 04/11/2015,3:06 PM

## 2015-04-12 ENCOUNTER — Observation Stay (HOSPITAL_COMMUNITY): Payer: Medicare Other

## 2015-04-12 DIAGNOSIS — E860 Dehydration: Secondary | ICD-10-CM | POA: Diagnosis not present

## 2015-04-12 DIAGNOSIS — J208 Acute bronchitis due to other specified organisms: Secondary | ICD-10-CM | POA: Diagnosis not present

## 2015-04-12 DIAGNOSIS — E119 Type 2 diabetes mellitus without complications: Secondary | ICD-10-CM | POA: Diagnosis not present

## 2015-04-12 DIAGNOSIS — R531 Weakness: Secondary | ICD-10-CM | POA: Diagnosis not present

## 2015-04-12 LAB — LEGIONELLA ANTIGEN, URINE

## 2015-04-12 LAB — CBC
HCT: 42.6 % (ref 39.0–52.0)
Hemoglobin: 14.5 g/dL (ref 13.0–17.0)
MCH: 31.3 pg (ref 26.0–34.0)
MCHC: 34 g/dL (ref 30.0–36.0)
MCV: 91.8 fL (ref 78.0–100.0)
PLATELETS: 199 10*3/uL (ref 150–400)
RBC: 4.64 MIL/uL (ref 4.22–5.81)
RDW: 12.7 % (ref 11.5–15.5)
WBC: 12.7 10*3/uL — ABNORMAL HIGH (ref 4.0–10.5)

## 2015-04-12 LAB — INFLUENZA PANEL BY PCR (TYPE A & B)
H1N1 flu by pcr: NOT DETECTED
INFLAPCR: NEGATIVE
INFLBPCR: NEGATIVE

## 2015-04-12 LAB — GLUCOSE, CAPILLARY
GLUCOSE-CAPILLARY: 158 mg/dL — AB (ref 65–99)
GLUCOSE-CAPILLARY: 150 mg/dL — AB (ref 65–99)
Glucose-Capillary: 142 mg/dL — ABNORMAL HIGH (ref 65–99)
Glucose-Capillary: 118 mg/dL — ABNORMAL HIGH (ref 65–99)

## 2015-04-12 LAB — HIV ANTIBODY (ROUTINE TESTING W REFLEX): HIV Screen 4th Generation wRfx: NONREACTIVE

## 2015-04-12 MED ORDER — SODIUM CHLORIDE 0.9 % IV SOLN
INTRAVENOUS | Status: AC
  Administered 2015-04-12: 19:00:00 via INTRAVENOUS

## 2015-04-12 NOTE — Evaluation (Signed)
Physical Therapy Evaluation Patient Details Name: William Oneill MRN: [id number] DOB: 10/13/1945 Today's Date: 04/12/2015   History of Present Illness  69 yo male admitted with acute purulent bronchitis, fall. Hx of DM  Clinical Impression  On eval, pt required Min guard assist for mobility-walked ~60 feet in room. Pt tolerated activity fairly well. Encouraged pt to ambulate a 2nd time, later today, with nursing supervision/assist (if able). Do not anticipate any f/u PT needs at discharge at this time. Pt states he plans to d/c to sister's home for a few days.     Follow Up Recommendations No PT follow up;Supervision for mobility/OOB    Equipment Recommendations  None recommended by PT    Recommendations for Other Services       Precautions / Restrictions Precautions Precautions: Fall Precaution Comments: droplet Restrictions Weight Bearing Restrictions: No      Mobility  Bed Mobility Overal bed mobility: Modified Independent             General bed mobility comments: HOB elevated. Increased time  Transfers Overall transfer level: Needs assistance Equipment used: Rolling walker (2 wheeled) Transfers: Sit to/from Stand Sit to Stand: Min guard         General transfer comment: close guard for safety. VCs hand placement  Ambulation/Gait Ambulation/Gait assistance: Min guard Ambulation Distance (Feet): 60 Feet Assistive device:  (IV pole) Gait Pattern/deviations: Step-through pattern;Decreased stride length     General Gait Details: close guard for safety. Pt able to walk with and without IV pole. Slightly unsteady. Slow gait speed.   Stairs            Wheelchair Mobility    Modified Rankin (Stroke Patients Only)       Balance Overall balance assessment: History of Falls;Needs assistance         Standing balance support: During functional activity Standing balance-Leahy Scale: Fair                               Pertinent  Vitals/Pain Pain Assessment: No/denies pain    Home Living Family/patient expects to be discharged to:: Private residence Living Arrangements: Alone (pt taking care of elderly woman)   Type of Home: House Home Access: Stairs to enter Entrance Stairs-Rails: Right Entrance Stairs-Number of Steps: 1-2 Home Layout: One level Home Equipment: None      Prior Function Level of Independence: Independent               Hand Dominance        Extremity/Trunk Assessment   Upper Extremity Assessment: Overall WFL for tasks assessed           Lower Extremity Assessment: Generalized weakness      Cervical / Trunk Assessment: Kyphotic  Communication   Communication: No difficulties  Cognition Arousal/Alertness: Awake/alert Behavior During Therapy: WFL for tasks assessed/performed Overall Cognitive Status: Within Functional Limits for tasks assessed                      General Comments      Exercises        Assessment/Plan    PT Assessment Patient needs continued PT services  PT Diagnosis Difficulty walking;Generalized weakness   PT Problem List Decreased strength;Decreased activity tolerance;Decreased balance;Decreased mobility;Decreased knowledge of use of DME  PT Treatment Interventions Gait training;Functional mobility training;Therapeutic activities;Patient/family education;Balance training;Therapeutic exercise   PT Goals (Current goals can be found in the Care Plan  section) Acute Rehab PT Goals Patient Stated Goal: to regain PLOF PT Goal Formulation: With patient Time For Goal Achievement: 04/26/15 Potential to Achieve Goals: Good    Frequency Min 3X/week   Barriers to discharge        Co-evaluation               End of Session Equipment Utilized During Treatment: Gait belt Activity Tolerance: Patient tolerated treatment well Patient left: in chair;with call bell/phone within reach;with chair alarm set;with family/visitor present       Functional Assessment Tool Used: clinical judgement Functional Limitation: Mobility: Walking and moving around Mobility: Walking and Moving Around Current Status [phone]): At least 1 percent but less than 20 percent impaired, limited or restricted Mobility: Walking and Moving Around Goal Status [phone]): At least 1 percent but less than 20 percent impaired, limited or restricted    Time: 1135-1157 PT Time Calculation (min) (ACUTE ONLY): 22 min   Charges:   PT Evaluation $Initial PT Evaluation Tier I: 1 Procedure     PT G Codes:   PT G-Codes **NOT FOR INPATIENT CLASS** Functional Assessment Tool Used: clinical judgement Functional Limitation: Mobility: Walking and moving around Mobility: Walking and Moving Around Current Status ([id number]): At least 1 percent but less than 20 percent impaired, limited or restricted Mobility: Walking and Moving Around Goal Status [phone]): At least 1 percent but less than 20 percent impaired, limited or restricted    Weston Anna, MPT Pager: [phone]

## 2015-04-12 NOTE — Progress Notes (Signed)
PROGRESS NOTE    William Oneill [id number]:[id number] DOB: [birth year] DOA: 04/11/2015 PCP: [name], MD  HPI/Brief narrative 69 y.o. male single, lives with a 34 year old family member that he helps take care of, independent of activities of daily living, PMH of DM 2, HLD, insomnia, lifelong nonsmoker, presented to the [facility] ED on 04/11/15 with generalized body aches, generalized weakness, sore throat, productive cough, low-grade fevers and a fall at home. In the ED, afebrile, mild sinus tachycardia on arrival which is improved, lab work significant for glucose 189, WBC 15.5, chest x-ray without acute cardiopulmonary disease. Hospitalist admission was requested for acute purulent bronchitis, ruling out developing pneumonia, generalized weakness related to acute illness and a fall.   Assessment/Plan:  Acute purulent bronchitis - Continue levofloxacin that was started in the ED - Influenza panel PCR: Negative, HIV antibody: Nonreactive, urine Legionella antigen: Negative, urine pneumococcal antigen: Negative - Repeat chest x-ray without pneumonia  Dehydration - Secondary to acute illness and poor oral intake. Brief IV fluids. Improved  Generalized weakness - Secondary to acute illness and poor oral intake. Treat as above. PT evaluation.  Mechanical fall - Secondary to generalized weakness. Check orthostatic blood pressures. Treat acute illness. PT evaluation.  Leukocytosis - Secondary to acute illness. Management as above. Follow CBCs improving  Type II DM without complications - Hold metformin. Place on NovoLog SSI.  Hyperlipidemia - Continue statins  Insomnia - Trazodone dose recently increased. Current presentation less likely due to medication change. Continue home dose and monitor.   DVT prophylaxis: Lovenox  Code Status: Full  Family Communication: None at bedside  Disposition Plan: DC home when medically stable, possibly  9/9   Consultants:  None  Procedures:  None  Antibiotics:  Levofloxacin 9/7 >   Subjective: Feels better. Cough improved. Still weak.  Objective: Filed Vitals:   04/11/15 1431 04/11/15 2120 04/12/15 0624 04/12/15 1406  BP:  139/67 115/59 118/63  Pulse:  100 64 77  Temp:  99 F (37.2 C) 98.8 F (37.1 C) 99.1 F (37.3 C)  TempSrc:  Oral Oral Oral  Resp:  18 18 19   Height: 5\' 7"  (1.702 m)     Weight: 82.5 kg (181 lb 14.1 oz)  82.4 kg (181 lb 10.5 oz)   SpO2:  96%  97%    Intake/Output Summary (Last 24 hours) at 04/12/15 1819 Last data filed at 04/12/15 1406  Gross per 24 hour  Intake 2184.67 ml  Output   1475 ml  Net 709.67 ml   Filed Weights   04/11/15 1431 04/12/15 0624  Weight: 82.5 kg (181 lb 14.1 oz) 82.4 kg (181 lb 10.5 oz)     Exam:  General exam: Pleasant middle-aged male, looks improved compared to yesterday, lying comfortably in bed. Respiratory system: Clear. No increased work of breathing. Cardiovascular system: S1 & S2 heard, RRR. No JVD, murmurs, gallops, clicks or pedal edema. Telemetry: Sinus rhythm with occasional PACs. Gastrointestinal system: Abdomen is nondistended, soft and nontender. Normal bowel sounds heard. Central nervous system: Alert and oriented. No focal neurological deficits. Extremities: Symmetric 5 x 5 power.   Data Reviewed: Basic Metabolic Panel:  Recent Labs Lab 04/11/15 0556  NA 138  K 3.6  CL 105  CO2 25  GLUCOSE 189*  BUN 21*  CREATININE 0.92  CALCIUM 8.6*   Liver Function Tests:  Recent Labs Lab 04/11/15 0556  AST 30  ALT 23  ALKPHOS 79  BILITOT 2.7*  PROT 6.8  ALBUMIN 4.1  No results for input(s): LIPASE, AMYLASE in the last 168 hours. No results for input(s): AMMONIA in the last 168 hours. CBC:  Recent Labs Lab 04/11/15 0556 04/12/15 0512  WBC 15.5* 12.7*  NEUTROABS 11.9*  --   HGB 13.7 14.5  HCT 39.6 42.6  MCV 89.2 91.8  PLT 241 199   Cardiac Enzymes: No results for input(s):  CKTOTAL, CKMB, CKMBINDEX, TROPONINI in the last 168 hours. BNP (last 3 results) No results for input(s): PROBNP in the last 8760 hours. CBG:  Recent Labs Lab 04/11/15 1632 04/11/15 2123 04/12/15 0728 04/12/15 1226 04/12/15 1604  GLUCAP 162* 205* 158* 142* 118*    No results found for this or any previous visit (from the past 240 hour(s)).         Studies: X-ray Chest Pa And Lateral  04/12/2015   CLINICAL DATA:  Cough, acute purulent bronchitis.  EXAM: CHEST  2 VIEW  COMPARISON:  April 11, 2015.  FINDINGS: Stable cardiomediastinal silhouette. No pneumothorax or pleural effusion is noted. Both lungs are clear. The visualized skeletal structures are unremarkable.  IMPRESSION: No active cardiopulmonary disease.   Electronically Signed   By: Marijo Conception, M.D.   On: 04/12/2015 08:22   Dg Chest 2 View  04/11/2015   CLINICAL DATA:  Cough, nausea and vomiting, increased weakness over last 2 days.  EXAM: CHEST  2 VIEW  COMPARISON:  05/28/2014  FINDINGS: The heart size and mediastinal contours are within normal limits. Both lungs are clear. The visualized skeletal structures are unremarkable.  IMPRESSION: No active cardiopulmonary disease.   Electronically Signed   By: Lucienne Capers M.D.   On: 04/11/2015 06:11        Scheduled Meds: . aspirin EC  81 mg Oral Daily  . atorvastatin  10 mg Oral Daily  . enoxaparin (LOVENOX) injection  40 mg Subcutaneous Q24H  . guaiFENesin  1,200 mg Oral BID  . insulin aspart  0-9 Units Subcutaneous TID WC  . levofloxacin (LEVAQUIN) IV  750 mg Intravenous Q24H  . multivitamin with minerals  1 tablet Oral Daily  . sodium chloride  3 mL Intravenous Q12H  . traZODone  100 mg Oral QHS   Continuous Infusions:    Principal Problem:   Acute purulent bronchitis Active Problems:   Type 2 diabetes mellitus without complication   Generalized weakness   Dehydration    Time spent: 20 minutes    HONGALGI,ANAND, MD, FACP, FHM. [facility] Pager [phone]  If 7PM-7AM, please contact night-coverage [email or web address] Password [facility] 04/12/2015, 6:19 PM

## 2015-04-13 DIAGNOSIS — E860 Dehydration: Secondary | ICD-10-CM | POA: Diagnosis not present

## 2015-04-13 DIAGNOSIS — E119 Type 2 diabetes mellitus without complications: Secondary | ICD-10-CM | POA: Diagnosis not present

## 2015-04-13 DIAGNOSIS — R531 Weakness: Secondary | ICD-10-CM | POA: Diagnosis not present

## 2015-04-13 DIAGNOSIS — J208 Acute bronchitis due to other specified organisms: Secondary | ICD-10-CM | POA: Diagnosis not present

## 2015-04-13 LAB — GLUCOSE, CAPILLARY
GLUCOSE-CAPILLARY: 188 mg/dL — AB (ref 65–99)
Glucose-Capillary: 141 mg/dL — ABNORMAL HIGH (ref 65–99)

## 2015-04-13 MED ORDER — GUAIFENESIN-DM 100-10 MG/5ML PO SYRP: 5.0000 mL | ORAL_SOLUTION | Freq: Three times a day (TID) | ORAL | Status: DC | PRN

## 2015-04-13 MED ORDER — LEVOFLOXACIN 500 MG PO TABS: 500.0000 mg | ORAL_TABLET | Freq: Every day | ORAL | Status: DC

## 2015-04-13 MED ORDER — PHENOL 1.4 % MT LIQD
1.0000 | OROMUCOSAL | Status: DC | PRN
  Filled 2015-04-13: qty 177

## 2015-04-13 MED ORDER — ACETAMINOPHEN 325 MG PO TABS: 650.0000 mg | ORAL_TABLET | Freq: Four times a day (QID) | ORAL | Status: DC | PRN

## 2015-04-13 MED ORDER — GUAIFENESIN ER 600 MG PO TB12: 1200.0000 mg | ORAL_TABLET | Freq: Two times a day (BID) | ORAL | Status: DC

## 2015-04-13 NOTE — Discharge Instructions (Signed)

## 2015-04-13 NOTE — Progress Notes (Signed)
Reviewed discharge instructions and medication with pt. Pt is stable for discharge to home. Pt has no needs. Pt is going to eat lunch and then he will be ready to leave the hospital.   Othella Boyer [facility] 04/13/2015 1:11 PM

## 2015-04-13 NOTE — Discharge Summary (Signed)
Physician Discharge Summary  William Oneill [id number]:[id number] DOB: 1945-09-29 DOA: 04/11/2015  PCP: [name], MD  Admit date: 04/11/2015 Discharge date: 04/13/2015  Time spent: Less than 30 minutes  Recommendations for Outpatient Follow-up:  1. Dr. [name], PCP in 1 week  Discharge Diagnoses:  Principal Problem:   Acute purulent bronchitis Active Problems:   Type 2 diabetes mellitus without complication   Generalized weakness   Dehydration   Discharge Condition: Improved & Stable  Diet recommendation: Heart healthy and diabetic diet.  Filed Weights   04/11/15 1431 04/12/15 0624  Weight: 82.5 kg (181 lb 14.1 oz) 82.4 kg (181 lb 10.5 oz)    History of present illness:  69 y.o. male single, lives with a 69 year old family member that he helps take care of, independent of activities of daily living, PMH of DM 2, HLD, insomnia, lifelong nonsmoker, presented to the [facility] ED on 04/11/15 with generalized body aches, generalized weakness, sore throat, productive cough, low-grade fevers and a fall at home. In the ED, afebrile, mild sinus tachycardia on arrival which is improved, lab work significant for glucose 189, WBC 15.5, chest x-ray without acute cardiopulmonary disease. Hospitalist admission was requested for acute purulent bronchitis, ruling out developing pneumonia, generalized weakness related to acute illness and a fall.  Hospital Course:   Acute purulent bronchitis - Influenza panel PCR: Negative, HIV antibody: Nonreactive, urine Legionella antigen: Negative, urine pneumococcal antigen: Negative - Repeat chest x-ray without pneumonia - Patient was started empirically on IV levofloxacin and has completed 3 days of treatment. He was treated supportively. He has clinically improved. Complete total of 5 days of antibiotics. Advised adequate hydration, rest, gradual increase in activity and outpatient follow-up with PCP.  Dehydration - Resolved after IV  fluids.  Generalized weakness - Improved. PT evaluation appreciated. No PT follow-up recommended.   Mechanical fall - Secondary to generalized weakness. Denies dizziness, lightheadedness in upright position or on ambulation. PT evaluation as above.   Leukocytosis - Secondary to acute illness. Management as above. Improved  Type II DM without complications - Hold metformin in hospital. Treated with SSI inpatient. Resume metformin at discharge.  Hyperlipidemia - Continue statins  Insomnia - Trazodone dose recently increased. Current presentation less likely due to medication change. Continue home dose and monitor.  Mild hyperbilirubinemia - Appears chronic. Asymptomatic. Outpatient evaluation and management as deemed necessary.   Consultants:  None  Procedures:  None  Antibiotics:  Levofloxacin 9/7 >  Discharge Exam:  Complaints: Continues to feel better. Cough minimal, intermittent with decreased sputum. No dyspnea, chest pain, fever or chills. No dizziness or lightheadedness. Improving strength.  Filed Vitals:   04/12/15 0624 04/12/15 1406 04/12/15 2134 04/13/15 0458  BP: 115/59 118/63 137/64 112/65  Pulse: 64 77 97 66  Temp: 98.8 F (37.1 C) 99.1 F (37.3 C) 98.7 F (37.1 C) 98.3 F (36.8 C)  TempSrc: Oral Oral Oral Oral  Resp: 18 19 18 16   Height:      Weight: 82.4 kg (181 lb 10.5 oz)     SpO2:  97% 98% 97%    General exam: Pleasant middle-aged male sitting up comfortably at edge of bed, looking much better than he did on admission and in no obvious distress. Respiratory system: Clear. No increased work of breathing. Cardiovascular system: S1 & S2 heard, RRR. No JVD, murmurs, gallops, clicks or pedal edema. Telemetry: Sinus rhythm with occasional PACs. Gastrointestinal system: Abdomen is nondistended, soft and nontender. Normal bowel sounds heard. Central nervous system:  Alert and oriented. No focal neurological deficits. Extremities: Symmetric 5 x 5  power.  Discharge Instructions      Discharge Instructions    Call MD for:  difficulty breathing, headache or visual disturbances    Complete by:  As directed      Call MD for:  extreme fatigue    Complete by:  As directed      Call MD for:  hives    Complete by:  As directed      Call MD for:  persistant dizziness or light-headedness    Complete by:  As directed      Call MD for:  persistant nausea and vomiting    Complete by:  As directed      Call MD for:  severe uncontrolled pain    Complete by:  As directed      Call MD for:  temperature >100.4    Complete by:  As directed      Diet - low sodium heart healthy    Complete by:  As directed      Diet Carb Modified    Complete by:  As directed      Increase activity slowly    Complete by:  As directed             Medication List    TAKE these medications        acetaminophen 325 MG tablet  Commonly known as:  TYLENOL  Take 2 tablets (650 mg total) by mouth every 6 (six) hours as needed for mild pain, moderate pain, fever or headache (or Fever >/= 101).     aspirin EC 81 MG tablet  Take 81 mg by mouth daily.     atorvastatin 10 MG tablet  Commonly known as:  LIPITOR  Take 1 tablet (10 mg total) by mouth daily.     blood glucose meter kit and supplies  Dispense based on patient and insurance preference. Use up to four times daily as directed. (FOR ICD-9 250.00, 250.01).     guaiFENesin 600 MG 12 hr tablet  Commonly known as:  MUCINEX  Take 2 tablets (1,200 mg total) by mouth 2 (two) times daily.     guaiFENesin-dextromethorphan 100-10 MG/5ML syrup  Commonly known as:  ROBITUSSIN DM  Take 5 mLs by mouth every 8 (eight) hours as needed for cough.     levofloxacin 500 MG tablet  Commonly known as:  LEVAQUIN  Take 1 tablet (500 mg total) by mouth daily. Start 04/14/2015.  Start taking on:  04/14/2015     metFORMIN 500 MG tablet  Commonly known as:  GLUCOPHAGE  Take 1 tablet (500 mg total) by mouth 2 (two)  times daily with a meal.     multivitamin with minerals Tabs tablet  Take 1 tablet by mouth daily.     traZODone 50 MG tablet  Commonly known as:  DESYREL  Take 2 tablets (100 mg total) by mouth at bedtime.       Follow-up Information    Follow up with GREENE,JEFFREY R, MD. Schedule an appointment as soon as possible for a visit in 1 week.   Specialties:  Family Medicine, Sports Medicine   Contact information:   [address] [place] [address] [phone]        The results of significant diagnostics from this hospitalization (including imaging, microbiology, ancillary and laboratory) are listed below for reference.    Significant Diagnostic Studies: X-ray Chest Pa And Lateral  04/12/2015  CLINICAL DATA:  Cough, acute purulent bronchitis.  EXAM: CHEST  2 VIEW  COMPARISON:  April 11, 2015.  FINDINGS: Stable cardiomediastinal silhouette. No pneumothorax or pleural effusion is noted. Both lungs are clear. The visualized skeletal structures are unremarkable.  IMPRESSION: No active cardiopulmonary disease.   Electronically Signed   By: Marijo Conception, M.D.   On: 04/12/2015 08:22   Dg Chest 2 View  04/11/2015   CLINICAL DATA:  Cough, nausea and vomiting, increased weakness over last 2 days.  EXAM: CHEST  2 VIEW  COMPARISON:  05/28/2014  FINDINGS: The heart size and mediastinal contours are within normal limits. Both lungs are clear. The visualized skeletal structures are unremarkable.  IMPRESSION: No active cardiopulmonary disease.   Electronically Signed   By: Lucienne Capers M.D.   On: 04/11/2015 06:11    Microbiology: No results found for this or any previous visit (from the past 240 hour(s)).   Labs: Basic Metabolic Panel:  Recent Labs Lab 04/11/15 0556  NA 138  K 3.6  CL 105  CO2 25  GLUCOSE 189*  BUN 21*  CREATININE 0.92  CALCIUM 8.6*   Liver Function Tests:  Recent Labs Lab 04/11/15 0556  AST 30  ALT 23  ALKPHOS 79  BILITOT 2.7*  PROT 6.8   ALBUMIN 4.1   No results for input(s): LIPASE, AMYLASE in the last 168 hours. No results for input(s): AMMONIA in the last 168 hours. CBC:  Recent Labs Lab 04/11/15 0556 04/12/15 0512  WBC 15.5* 12.7*  NEUTROABS 11.9*  --   HGB 13.7 14.5  HCT 39.6 42.6  MCV 89.2 91.8  PLT 241 199   Cardiac Enzymes: No results for input(s): CKTOTAL, CKMB, CKMBINDEX, TROPONINI in the last 168 hours. BNP: BNP (last 3 results) No results for input(s): BNP in the last 8760 hours.  ProBNP (last 3 results) No results for input(s): PROBNP in the last 8760 hours.  CBG:  Recent Labs Lab 04/12/15 0728 04/12/15 1226 04/12/15 1604 04/12/15 2136 04/13/15 0730  GLUCAP 158* 142* 118* 150* 141*       Signed:  Vernell Leep, MD, FACP, FHM. [facility] Pager [phone]  If 7PM-7AM, please contact night-coverage [email or web address] Password [email or web address] 04/13/2015, 11:09 AM

## 2015-04-17 ENCOUNTER — Ambulatory Visit (INDEPENDENT_AMBULATORY_CARE_PROVIDER_SITE_OTHER): Payer: Medicare Other | Admitting: Family Medicine

## 2015-04-17 VITALS — BP 108/62 | HR 55 | Temp 97.6°F | Resp 18 | Ht 65.0 in | Wt 177.0 lb

## 2015-04-17 DIAGNOSIS — J209 Acute bronchitis, unspecified: Secondary | ICD-10-CM

## 2015-04-17 DIAGNOSIS — D72829 Elevated white blood cell count, unspecified: Secondary | ICD-10-CM | POA: Diagnosis not present

## 2015-04-17 DIAGNOSIS — E119 Type 2 diabetes mellitus without complications: Secondary | ICD-10-CM | POA: Diagnosis not present

## 2015-04-17 DIAGNOSIS — R634 Abnormal weight loss: Secondary | ICD-10-CM | POA: Diagnosis not present

## 2015-04-17 DIAGNOSIS — R17 Unspecified jaundice: Secondary | ICD-10-CM | POA: Diagnosis not present

## 2015-04-17 DIAGNOSIS — R5383 Other fatigue: Secondary | ICD-10-CM | POA: Diagnosis not present

## 2015-04-17 LAB — POCT CBC
Granulocyte percent: 70.4 %G (ref 37–80)
HCT, POC: 41.5 % — AB (ref 43.5–53.7)
HEMOGLOBIN: 13.7 g/dL — AB (ref 14.1–18.1)
Lymph, poc: 2.4 (ref 0.6–3.4)
MCH: 29.3 pg (ref 27–31.2)
MCHC: 33.1 g/dL (ref 31.8–35.4)
MCV: 88.5 fL (ref 80–97)
MID (cbc): 0.6 (ref 0–0.9)
MPV: 7.5 fL (ref 0–99.8)
POC Granulocyte: 7.2 — AB (ref 2–6.9)
POC LYMPH PERCENT: 24 %L (ref 10–50)
POC MID %: 5.6 %M (ref 0–12)
Platelet Count, POC: 425 10*3/uL — AB (ref 142–424)
RBC: 4.69 M/uL (ref 4.69–6.13)
RDW, POC: 12.7 %
WBC: 10.2 10*3/uL (ref 4.6–10.2)

## 2015-04-17 LAB — COMPLETE METABOLIC PANEL WITH GFR
ALT: 37 U/L (ref 9–46)
AST: 22 U/L (ref 10–35)
Albumin: 4.1 g/dL (ref 3.6–5.1)
Alkaline Phosphatase: 102 U/L (ref 40–115)
BUN: 20 mg/dL (ref 7–25)
CHLORIDE: 103 mmol/L (ref 98–110)
CO2: 25 mmol/L (ref 20–31)
CREATININE: 0.67 mg/dL — AB (ref 0.70–1.25)
Calcium: 9.2 mg/dL (ref 8.6–10.3)
Glucose, Bld: 127 mg/dL — ABNORMAL HIGH (ref 65–99)
Potassium: 4.5 mmol/L (ref 3.5–5.3)
Sodium: 139 mmol/L (ref 135–146)
Total Bilirubin: 1 mg/dL (ref 0.2–1.2)
Total Protein: 6.6 g/dL (ref 6.1–8.1)

## 2015-04-17 LAB — GLUCOSE, POCT (MANUAL RESULT ENTRY): POC Glucose: 133 mg/dl — AB (ref 70–99)

## 2015-04-17 NOTE — Patient Instructions (Signed)
Your blood count has improved today, blood sugar is stable. Blood pressures and heart rate appear to be stable in the office. Increase fluid intake throughout the day, and try Glucerna, Boost or Ensure if needed for extra calories throughout the day.  Hemoglobin or tests for anemia was slightly lower than when hospital, so can recheck this at a follow-up in the next 1 week.  If you are worsening prior to that time, return sooner.  You should receive a call or letter about your lab results within the next week to 10 days.

## 2015-04-17 NOTE — Progress Notes (Addendum)
Subjective:    Patient ID: William Oneill, male    DOB: 08/20/1945, 69 y.o.   MRN: [id number] This chart was scribed for [name], MD by Zola Button, Medical Scribe. This patient was seen in Room 14 and the patient's care was started at 10:23 AM.    HPI HPI Comments: William Oneill is a 69 y.o. male with a history of DM, hypertension, and hyperlipidemia who presents to the [facility] for a hospitalization follow-up. Last visit with me was on August 15th. He was admitted on Sept 7th, discharged September 9th. Was admitted for acute purulent bronchitis, generalized weakness due to illness, and a fall. Monitored for pneumonia, but X-ray did not show any active cardiopulmonary disease, including repeat CXR. He had a negative flu panel, non-reactive HIV, negative urine Legionella antigen, negative pneumococcal antigen, and negative troponin. CMP was overall normal, including albumin normal at 4.1; did note elevated bilirubin, which has been noted prior. WBC initially 15.5 decreased to 12.7 on the 8th. He was started on Levaquin with plan of 5 total days of antibiotics. Dehydration resolved with IV fluids. He had a PT evaluation, but no follow-up recommended. Generalized weakness thought to be due to concurrent illness and no outpatient physical therapy recommended.   Patient finished his Levaquin yesterday. He has been feeling better overall, but is still feeling generally weak. The cough has improved some. He has been taking his time when ambulating. He is eating, but his appetite has not completely returned to normal; he eats 3 small meals a day. He has been drinking fluids and urinating about 1-2 times a day. Patient denies chest pain, palpitations, and SOB currently.  Diabetes: Metformin was held while in the hospital and was treated with sliding scale insulin. He was discharged back on home dose of metformin. Blood sugar while in the hospital ranged from 116-205.  Wt Readings  from Last 3 Encounters:  04/17/15 177 lb (80.287 kg)  04/12/15 181 lb 10.5 oz (82.4 kg)  04/02/15 182 lb 9.6 oz (82.827 kg)     Patient Active Problem List   Diagnosis Date Noted  . Generalized weakness 04/11/2015  . Acute purulent bronchitis 04/11/2015  . Dehydration 04/11/2015  . Nocturia more than twice per night 01/16/2015  . Snoring 01/16/2015  . 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 Obese abdomen 01/16/2015  . Other fatigue 01/16/2015  . Type 2 diabetes mellitus without complication 12/45/8099  . Insomnia    Past Medical History  Diagnosis Date  . Insomnia   . Diabetes mellitus without complication   . High cholesterol    History reviewed. No pertinent past surgical history. No Known Allergies Prior to Admission medications   Medication Sig Start Date End Date Taking? Authorizing Provider  acetaminophen (TYLENOL) 325 MG tablet Take 2 tablets (650 mg total) by mouth every 6 (six) hours as needed for mild pain, moderate pain, fever or headache (or Fever >/= 101). 04/13/15  Yes Modena Jansky, MD  aspirin EC 81 MG tablet Take 81 mg by mouth daily.   Yes Historical Provider, MD  atorvastatin (LIPITOR) 10 MG tablet Take 1 tablet (10 mg total) by mouth daily. 12/21/14  Yes [name], MD  blood glucose meter kit and supplies Dispense based on patient and insurance preference. Use up to four times daily as directed. (FOR ICD-9 250.00, 250.01). 12/11/14  Yes [name], MD  guaiFENesin (MUCINEX) 600 MG 12 hr tablet Take 2 tablets (  1,200 mg total) by mouth 2 (two) times daily. 04/13/15  Yes Modena Jansky, MD  guaiFENesin-dextromethorphan (ROBITUSSIN DM) 100-10 MG/5ML syrup Take 5 mLs by mouth every 8 (eight) hours as needed for cough. 04/13/15  Yes Modena Jansky, MD  levofloxacin (LEVAQUIN) 500 MG tablet Take 1 tablet (500 mg total) by mouth daily. Start 04/14/2015. 04/14/15  Yes Modena Jansky, MD  metFORMIN (GLUCOPHAGE) 500 MG tablet Take 1 tablet (500  mg total) by mouth 2 (two) times daily with a meal. 09/04/14  Yes Thao P Le, DO  Multiple Vitamin (MULTIVITAMIN WITH MINERALS) TABS tablet Take 1 tablet by mouth daily.   Yes Historical Provider, MD  traZODone (DESYREL) 50 MG tablet Take 2 tablets (100 mg total) by mouth at bedtime. 01/09/15  Yes Joretta Bachelor, PA   Social History   Social History  . Marital Status: Married    Spouse Name: N/A  . Number of Children: N/A  . Years of Education: N/A   Occupational History  . Not on file.   Social History Main Topics  . Smoking status: Never Smoker   . Smokeless tobacco: Not on file  . Alcohol Use: No  . Drug Use: No  . Sexual Activity: Not on file   Other Topics Concern  . Not on file   Social History Narrative     Review of Systems  Constitutional: Positive for appetite change and fatigue.  Respiratory: Positive for cough. Negative for shortness of breath.   Cardiovascular: Negative for chest pain and palpitations.  Neurological: Positive for weakness.       Objective:   Physical Exam  Constitutional: He is oriented to person, place, and time. He appears well-developed and well-nourished. No distress.  HENT:  Head: Normocephalic and atraumatic.  Mouth/Throat: Oropharynx is clear and moist. No oropharyngeal exudate.  Overall moist oral mucosa.  Eyes: Pupils are equal, round, and reactive to light.  Neck: Neck supple.  Cardiovascular: Normal rate.   Regular rate with rare ectopic beat.  Pulmonary/Chest: Effort normal and breath sounds normal. No respiratory distress. He has no wheezes. He has no rhonchi. He has no rales.  Clear to auscultation bilaterally.   Abdominal: Soft. There is no tenderness.  Musculoskeletal: He exhibits no edema.  No lower extremity edema.  Neurological: He is alert and oriented to person, place, and time. No cranial nerve deficit.  Equal strength upper and lower extremities.  Skin: Skin is warm and dry. No rash noted.  Psychiatric: He has  a normal mood and affect. His behavior is normal.  Nursing note and vitals reviewed.     Filed Vitals:   04/17/15 0924  BP: 108/62  Pulse: 55  Temp: 97.6 F (36.4 C)  TempSrc: Oral  Resp: 18  Height: $Remove'5\' 5"'CoFYqOm$  (1.651 m)  Weight: 177 lb (80.287 kg)  SpO2: 98%   Results for orders placed or performed in visit on 04/17/15  POCT glucose (manual entry)  Result Value Ref Range   POC Glucose 133 (A) 70 - 99 mg/dl  POCT CBC  Result Value Ref Range   WBC 10.2 4.6 - 10.2 K/uL   Lymph, poc 2.4 0.6 - 3.4   POC LYMPH PERCENT 24.0 10 - 50 %L   MID (cbc) 0.6 0 - 0.9   POC MID % 5.6 0 - 12 %M   POC Granulocyte 7.2 (A) 2 - 6.9   Granulocyte percent 70.4 37 - 80 %G   RBC 4.69 4.69 - 6.13 M/uL  Hemoglobin 13.7 (A) 14.1 - 18.1 g/dL   HCT, POC 41.5 (A) 43.5 - 53.7 %   MCV 88.5 80 - 97 fL   MCH, POC 29.3 27 - 31.2 pg   MCHC 33.1 31.8 - 35.4 g/dL   RDW, POC 12.7 %   Platelet Count, POC 425 (A) 142 - 424 K/uL   MPV 7.5 0 - 99.8 fL      Assessment & Plan:  William Oneill is a 69 y.o. male Acute bronchitis, unspecified organism  Other fatigue - Plan: POCT glucose (manual entry)  Leukocytosis - Plan: POCT CBC  Loss of weight  Elevated bilirubin - Plan: COMPLETE METABOLIC PANEL WITH GFR  Type 2 diabetes mellitus without complication - Plan: POCT glucose (manual entry)  Recent hospitalization with fatigue, volume depletion, primarily acute bronchitis. Lungs are clear on exam today, status post Levaquin for appropriate course. CBC improved WBCs.  Borderline hemoglobin, and some persistent fatigue. Plan to recheck CBC in 1 week, sooner if increase in fatigue lightheadedness or dizziness. Increased fluid intake and nutritional supplements such as Glucerna if needed. Will recheck CMP to evaluate albumin, and repeat bilirubin. If bilirubin remains elevated, further testing to be determined.   No orders of the defined types were placed in this encounter.   Patient Instructions  Your blood  count has improved today, blood sugar is stable. Blood pressures and heart rate appear to be stable in the office. Increase fluid intake throughout the day, and try Glucerna, Boost or Ensure if needed for extra calories throughout the day.  Hemoglobin or tests for anemia was slightly lower than when hospital, so can recheck this at a follow-up in the next 1 week.  If you are worsening prior to that time, return sooner.  You should receive a call or letter about your lab results within the next week to 10 days.        I personally performed the services described in this documentation, which was scribed in my presence. The recorded information has been reviewed and considered, and addended by me as needed.

## 2015-04-24 ENCOUNTER — Ambulatory Visit (INDEPENDENT_AMBULATORY_CARE_PROVIDER_SITE_OTHER): Payer: Medicare Other | Admitting: Family Medicine

## 2015-04-24 VITALS — BP 138/58 | HR 62 | Temp 98.0°F | Resp 17 | Ht <= 58 in | Wt 181.0 lb

## 2015-04-24 DIAGNOSIS — G4700 Insomnia, unspecified: Secondary | ICD-10-CM | POA: Diagnosis not present

## 2015-04-24 DIAGNOSIS — R059 Cough, unspecified: Secondary | ICD-10-CM

## 2015-04-24 DIAGNOSIS — R05 Cough: Secondary | ICD-10-CM

## 2015-04-24 DIAGNOSIS — R5383 Other fatigue: Secondary | ICD-10-CM

## 2015-04-24 LAB — POCT CBC
GRANULOCYTE PERCENT: 74.7 % (ref 37–80)
HEMATOCRIT: 41.4 % — AB (ref 43.5–53.7)
HEMOGLOBIN: 13.4 g/dL — AB (ref 14.1–18.1)
LYMPH, POC: 2.3 (ref 0.6–3.4)
MCH, POC: 28.5 pg (ref 27–31.2)
MCHC: 32.3 g/dL (ref 31.8–35.4)
MCV: 88.4 fL (ref 80–97)
MID (cbc): 0.3 (ref 0–0.9)
MPV: 8.5 fL (ref 0–99.8)
POC GRANULOCYTE: 7.5 — AB (ref 2–6.9)
POC LYMPH PERCENT: 22.6 %L (ref 10–50)
POC MID %: 2.7 %M (ref 0–12)
Platelet Count, POC: 349 10*3/uL (ref 142–424)
RBC: 4.69 M/uL (ref 4.69–6.13)
RDW, POC: 12.9 %
WBC: 10.1 10*3/uL (ref 4.6–10.2)

## 2015-04-24 MED ORDER — SUVOREXANT 10 MG PO TABS: 10.0000 mg | ORAL_TABLET | Freq: Every evening | ORAL | Status: DC | PRN

## 2015-04-24 MED ORDER — TRAZODONE HCL 50 MG PO TABS: 100.0000 mg | ORAL_TABLET | Freq: Every day | ORAL | Status: DC

## 2015-04-24 NOTE — Progress Notes (Addendum)
Subjective:    Patient ID: William Oneill, male    DOB: [birth year], 69 y.o.   MRN: [id number] This chart was scribed for [name], MD by Marti Sleigh, Medical Scribe. This patient was seen in Room 1 and the patient's care was started a 2:59 PM.  Chief Complaint  Patient presents with  . Follow-up  . Cough  . Bronchitis    HPI HPI Comments: William Oneill is a 69 y.o. male with a past hx DM type II who presents to [facility] reporting for a follow up for bronchitis with hospitalization two weeks ago. The pt was last seen in the office one week ago and at that time he was suffering from fatigue, and noted continued weight loss with decreased appetite. At that visit his CBC had improved, Levaquin was completed, and lungs were clear. His CBC did show a borderline hemaglobin of 14.7, as well as an elevated bilirubin. The pt was also advised at that visit to try supplements such as Glucerna and to increase fluid intake.  The pt reports today to repeat CBC and be rechecked. He states his appetite is increasing and his energy level is somewhat improved. He has been eating boost smoothies. He states he felt the best that he has felt since leaving the hospital last Sunday (two days ago). He states he is having difficulty sleeping, especially staying asleep. He denies increased anxiety or stress. He states he sometimes has trouble getting to sleep, and once he does go to sleep he only sleeps for a few hours,and then has trouble going back to sleep. He had a sleep study in July which showed no sign of sleep apnea.    Results for orders placed or performed in visit on 04/17/15  COMPLETE METABOLIC PANEL WITH GFR  Result Value Ref Range   Sodium 139 135 - 146 mmol/L   Potassium 4.5 3.5 - 5.3 mmol/L   Chloride 103 98 - 110 mmol/L   CO2 25 20 - 31 mmol/L   Glucose, Bld 127 (H) 65 - 99 mg/dL   BUN 20 7 - 25 mg/dL   Creat 0.67 (L) 0.70 - 1.25 mg/dL   Total Bilirubin 1.0 0.2 - 1.2 mg/dL   Alkaline  Phosphatase 102 40 - 115 U/L   AST 22 10 - 35 U/L   ALT 37 9 - 46 U/L   Total Protein 6.6 6.1 - 8.1 g/dL   Albumin 4.1 3.6 - 5.1 g/dL   Calcium 9.2 8.6 - 10.3 mg/dL   GFR, Est African American >89 >=60 mL/min   GFR, Est Non African American >89 >=60 mL/min  POCT glucose (manual entry)  Result Value Ref Range   POC Glucose 133 (A) 70 - 99 mg/dl  POCT CBC  Result Value Ref Range   WBC 10.2 4.6 - 10.2 K/uL   Lymph, poc 2.4 0.6 - 3.4   POC LYMPH PERCENT 24.0 10 - 50 %L   MID (cbc) 0.6 0 - 0.9   POC MID % 5.6 0 - 12 %M   POC Granulocyte 7.2 (A) 2 - 6.9   Granulocyte percent 70.4 37 - 80 %G   RBC 4.69 4.69 - 6.13 M/uL   Hemoglobin 13.7 (A) 14.1 - 18.1 g/dL   HCT, POC 41.5 (A) 43.5 - 53.7 %   MCV 88.5 80 - 97 fL   MCH, POC 29.3 27 - 31.2 pg   MCHC 33.1 31.8 - 35.4 g/dL   RDW, POC 12.7 %  Platelet Count, POC 425 (A) 142 - 424 K/uL   MPV 7.5 0 - 99.8 fL     Patient Active Problem List   Diagnosis Date Noted  . Generalized weakness 04/11/2015  . Acute purulent bronchitis 04/11/2015  . Dehydration 04/11/2015  . Nocturia more than twice per night 01/16/2015  . Snoring 01/16/2015  . 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 Obese abdomen 01/16/2015  . Other fatigue 01/16/2015  . Type 2 diabetes mellitus without complication 40/98/1191  . Insomnia    Past Medical History  Diagnosis Date  . Insomnia   . Diabetes mellitus without complication   . High cholesterol    No past surgical history on file. No Known Allergies Prior to Admission medications   Medication Sig Start Date End Date Taking? Authorizing Provider  acetaminophen (TYLENOL) 325 MG tablet Take 2 tablets (650 mg total) by mouth every 6 (six) hours as needed for mild pain, moderate pain, fever or headache (or Fever >/= 101). 04/13/15  Yes Modena Jansky, MD  aspirin EC 81 MG tablet Take 81 mg by mouth daily.   Yes Historical Provider, MD  atorvastatin (LIPITOR) 10 MG tablet Take 1 tablet (10 mg  total) by mouth daily. 12/21/14  Yes [name], MD  blood glucose meter kit and supplies Dispense based on patient and insurance preference. Use up to four times daily as directed. (FOR ICD-9 250.00, 250.01). 12/11/14  Yes [name], MD  guaiFENesin-dextromethorphan [facility] DM) 100-10 MG/5ML syrup Take 5 mLs by mouth every 8 (eight) hours as needed for cough. 04/13/15  Yes Modena Jansky, MD  metFORMIN (GLUCOPHAGE) 500 MG tablet Take 1 tablet (500 mg total) by mouth 2 (two) times daily with a meal. 09/04/14  Yes Thao P Le, DO  Multiple Vitamin (MULTIVITAMIN WITH MINERALS) TABS tablet Take 1 tablet by mouth daily.   Yes Historical Provider, MD  guaiFENesin (MUCINEX) 600 MG 12 hr tablet Take 2 tablets (1,200 mg total) by mouth 2 (two) times daily. Patient not taking: Reported on 04/24/2015 04/13/15   Modena Jansky, MD  levofloxacin (LEVAQUIN) 500 MG tablet Take 1 tablet (500 mg total) by mouth daily. Start 04/14/2015. Patient not taking: Reported on 04/24/2015 04/14/15   Modena Jansky, MD  traZODone (DESYREL) 50 MG tablet Take 2 tablets (100 mg total) by mouth at bedtime. Patient not taking: Reported on 04/24/2015 01/09/15   Joretta Bachelor, PA   Social History   Social History  . Marital Status: Married    Spouse Name: N/A  . Number of Children: N/A  . Years of Education: N/A   Occupational History  . Not on file.   Social History Main Topics  . Smoking status: Never Smoker   . Smokeless tobacco: Not on file  . Alcohol Use: No  . Drug Use: No  . Sexual Activity: Not on file   Other Topics Concern  . Not on file   Social History Narrative    Review of Systems  Constitutional: Positive for appetite change and fatigue. Negative for fever and chills.  HENT: Negative for congestion and rhinorrhea.   Respiratory: Negative for cough, chest tightness, shortness of breath and wheezing.   Cardiovascular: Negative for chest pain.  Neurological: Negative for weakness and  headaches.       Objective:   Physical Exam  Constitutional: He is oriented to person, place, and time. He appears well-developed and well-nourished. No distress.  HENT:  Head: Normocephalic and atraumatic.  Right Ear: Tympanic membrane, external ear and ear canal normal.  Left Ear: Tympanic membrane, external ear and ear canal normal.  Nose: No rhinorrhea.  Mouth/Throat: Oropharynx is clear and moist and mucous membranes are normal. No oropharyngeal exudate or posterior oropharyngeal erythema.  Eyes: Conjunctivae are normal. Pupils are equal, round, and reactive to light.  Neck: Neck supple.  Cardiovascular: Normal rate, regular rhythm, normal heart sounds and intact distal pulses.   No murmur heard. Pulmonary/Chest: Effort normal and breath sounds normal. No respiratory distress. He has no wheezes. He has no rhonchi. He has no rales.  Abdominal: Soft. There is no tenderness.  Musculoskeletal: Normal range of motion.  Lymphadenopathy:    He has no cervical adenopathy.  Neurological: He is alert and oriented to person, place, and time. Coordination normal.  Skin: Skin is warm and dry. No rash noted. He is not diaphoretic.  Psychiatric: He has a normal mood and affect. His behavior is normal.  Nursing note and vitals reviewed.   Filed Vitals:   04/24/15 1408  BP: 138/58  Pulse: 62  Temp: 98 F (36.7 C)  TempSrc: Oral  Resp: 17  Height: 1' (0.305 m)  Weight: 181 lb (82.101 kg)  SpO2: 98%    Results for orders placed or performed in visit on 04/24/15  POCT CBC  Result Value Ref Range   WBC 10.1 4.6 - 10.2 K/uL   Lymph, poc 2.3 0.6 - 3.4   POC LYMPH PERCENT 22.6 10 - 50 %L   MID (cbc) 0.3 0 - 0.9   POC MID % 2.7 0 - 12 %M   POC Granulocyte 7.5 (A) 2 - 6.9   Granulocyte percent 74.7 37 - 80 %G   RBC 4.69 4.69 - 6.13 M/uL   Hemoglobin 13.4 (A) 14.1 - 18.1 g/dL   HCT, POC 41.4 (A) 43.5 - 53.7 %   MCV 88.4 80 - 97 fL   MCH, POC 28.5 27 - 31.2 pg   MCHC 32.3 31.8 - 35.4  g/dL   RDW, POC 12.9 %   Platelet Count, POC 349 142 - 424 K/uL   MPV 8.5 0 - 99.8 fL       Assessment & Plan:   By signing my name below, I, Judithe Modest, attest that this documentation has been prepared under the direction and in the presence of [name], MD. Electronically Signed: Judithe Modest, ER Scribe. 04/24/2015. 2:52 PM.   William Oneill is a 69 y.o. male Insomnia - Plan: traZODone (DESYREL) 50 MG tablet, Suvorexant (BELSOMRA) 10 MG TABS  - Prior normal sleep study and. Suspect some psychogenic component, but with more persistent wakening symptoms throughout the night with only slight difficulty getting to sleep to begin with, and lack of relief with other behavioral changes, agreed to try Belsomra. Would not start this until his symptoms from bronchitis have cleared and he is at his baseline. When ready -  stop trazodone, start Belsomra, and follow-up with me within 10 days. Ten-day prescription completed with voucher and handout.  Other fatigue - Plan: POCT CBC, Cough  -Suspect this hospitalization for bronchitis. Has completed full course of Levaquin, fatigue is improving, afebrile lungs are clear. Continue to increase fluids, slow recovery expected but should be persistently improving day by day. RTC precautions if he plateaus or any worsening of symptoms.  Meds ordered this encounter  Medications  . traZODone (DESYREL) 50 MG tablet    Sig: Take 2 tablets (100 mg total) by  mouth at bedtime.    Dispense:  60 tablet    Refill:  0  . Suvorexant (BELSOMRA) 10 MG TABS    Sig: Take 10 mg by mouth at bedtime as needed.    Dispense:  10 tablet    Refill:  0   Patient Instructions  Your lungs sound clear today. Your energy level should continue to improve. Make sure you are drinking plenty of fluids and increase diet as tolerated. Keep an eye on your blood sugars as you do this.  We can consider different medication for sleep, called Belsomra. I would like you to  see your current symptoms from the hospital completely resolve and energy level back to baseline before starting new medication. Okay to continue trazodone 2 pills at bedtime for now. Once all your symptoms have improved from the hospital, stop the trazodone, then start the new medication Belsomra. Follow-up with me within 10 days of starting this medication, or sooner if any side effects or worsening of symptoms.  Use the discount card so your first prescription will be free. When you follow-up with me, I can send in more of the same prescription if it is working well.   Return to the clinic or go to the nearest emergency room if any of your symptoms worsen or new symptoms occur.  Insomnia Insomnia is frequent trouble falling and/or staying asleep. Insomnia can be a long term problem or a short term problem. Both are common. Insomnia can be a short term problem when the wakefulness is related to a certain stress or worry. Long term insomnia is often related to ongoing stress during waking hours and/or poor sleeping habits. Overtime, sleep deprivation itself can make the problem worse. Every little thing feels more severe because you are overtired and your ability to cope is decreased. CAUSES   Stress, anxiety, and depression.  Poor sleeping habits.  Distractions such as TV in the bedroom.  Naps close to bedtime.  Engaging in emotionally charged conversations before bed.  Technical reading before sleep.  Alcohol and other sedatives. They may make the problem worse. They can hurt normal sleep patterns and normal dream activity.  Stimulants such as caffeine for several hours prior to bedtime.  Pain syndromes and shortness of breath can cause insomnia.  Exercise late at night.  Changing time zones may cause sleeping problems (jet lag). It is sometimes helpful to have someone observe your sleeping patterns. They should look for periods of not breathing during the night (sleep apnea). They  should also look to see how long those periods last. If you live alone or observers are uncertain, you can also be observed at a sleep clinic where your sleep patterns will be professionally monitored. Sleep apnea requires a checkup and treatment. Give your caregivers your medical history. Give your caregivers observations your family has made about your sleep.  SYMPTOMS   Not feeling rested in the morning.  Anxiety and restlessness at bedtime.  Difficulty falling and staying asleep. TREATMENT   Your caregiver may prescribe treatment for an underlying medical disorders. Your caregiver can give advice or help if you are using alcohol or other drugs for self-medication. Treatment of underlying problems will usually eliminate insomnia problems.  Medications can be prescribed for short time use. They are generally not recommended for lengthy use.  Over-the-counter sleep medicines are not recommended for lengthy use. They can be habit forming.  You can promote easier sleeping by making lifestyle changes such as:  Using relaxation techniques  that help with breathing and reduce muscle tension.  Exercising earlier in the day.  Changing your diet and the time of your last meal. No night time snacks.  Establish a regular time to go to bed.  Counseling can help with stressful problems and worry.  Soothing music and white noise may be helpful if there are background noises you cannot remove.  Stop tedious detailed work at least one hour before bedtime. HOME CARE INSTRUCTIONS   Keep a diary. Inform your caregiver about your progress. This includes any medication side effects. See your caregiver regularly. Take note of:  Times when you are asleep.  Times when you are awake during the night.  The quality of your sleep.  How you feel the next day. This information will help your caregiver care for you.  Get out of bed if you are still awake after 15 minutes. Read or do some quiet  activity. Keep the lights down. Wait until you feel sleepy and go back to bed.  Keep regular sleeping and waking hours. Avoid naps.  Exercise regularly.  Avoid distractions at bedtime. Distractions include watching television or engaging in any intense or detailed activity like attempting to balance the household checkbook.  Develop a bedtime ritual. Keep a familiar routine of bathing, brushing your teeth, climbing into bed at the same time each night, listening to soothing music. Routines increase the success of falling to sleep faster.  Use relaxation techniques. This can be using breathing and muscle tension release routines. It can also include visualizing peaceful scenes. You can also help control troubling or intruding thoughts by keeping your mind occupied with boring or repetitive thoughts like the old concept of counting sheep. You can make it more creative like imagining planting one beautiful flower after another in your backyard garden.  During your day, work to eliminate stress. When this is not possible use some of the previous suggestions to help reduce the anxiety that accompanies stressful situations. MAKE SURE YOU:   Understand these instructions.  Will watch your condition.  Will get help right away if you are not doing well or get worse. Document Released: 07/18/2000 Document Revised: 10/13/2011 Document Reviewed: 08/18/2007 [facility] Patient Information 2015 [place], [place]. This information is not intended to replace advice given to you by your health care provider. Make sure you discuss any questions you have with your health care provider.

## 2015-04-24 NOTE — Patient Instructions (Addendum)
Your lungs sound clear today. Your energy level should continue to improve. Make sure you are drinking plenty of fluids and increase diet as tolerated. Keep an eye on your blood sugars as you do this. Your blood count was still borderline, but stable. We can recheck this at a future office visit in the next month or 2. If you are having increasing fatigue, lightheadedness, dizziness, or shortness of breath, return here or the emergency room  We can consider different medication for sleep, called Belsomra. I would like you to see your current symptoms from the hospital completely resolve and energy level back to baseline before starting new medication. Okay to continue trazodone 2 pills at bedtime for now. Once all your symptoms have improved from the hospital, stop the trazodone, then start the new medication Belsomra. Follow-up with me within 10 days of starting this medication, or sooner if any side effects or worsening of symptoms.  Use the discount card so your first prescription will be free. When you follow-up with me, I can send in more of the same prescription if it is working well.   Return to the clinic or go to the nearest emergency room if any of your symptoms worsen or new symptoms occur.  Insomnia Insomnia is frequent trouble falling and/or staying asleep. Insomnia can be a long term problem or a short term problem. Both are common. Insomnia can be a short term problem when the wakefulness is related to a certain stress or worry. Long term insomnia is often related to ongoing stress during waking hours and/or poor sleeping habits. Overtime, sleep deprivation itself can make the problem worse. Every little thing feels more severe because you are overtired and your ability to cope is decreased. CAUSES   Stress, anxiety, and depression.  Poor sleeping habits.  Distractions such as TV in the bedroom.  Naps close to bedtime.  Engaging in emotionally charged conversations before  bed.  Technical reading before sleep.  Alcohol and other sedatives. They may make the problem worse. They can hurt normal sleep patterns and normal dream activity.  Stimulants such as caffeine for several hours prior to bedtime.  Pain syndromes and shortness of breath can cause insomnia.  Exercise late at night.  Changing time zones may cause sleeping problems (jet lag). It is sometimes helpful to have someone observe your sleeping patterns. They should look for periods of not breathing during the night (sleep apnea). They should also look to see how long those periods last. If you live alone or observers are uncertain, you can also be observed at a sleep clinic where your sleep patterns will be professionally monitored. Sleep apnea requires a checkup and treatment. Give your caregivers your medical history. Give your caregivers observations your family has made about your sleep.  SYMPTOMS   Not feeling rested in the morning.  Anxiety and restlessness at bedtime.  Difficulty falling and staying asleep. TREATMENT   Your caregiver may prescribe treatment for an underlying medical disorders. Your caregiver can give advice or help if you are using alcohol or other drugs for self-medication. Treatment of underlying problems will usually eliminate insomnia problems.  Medications can be prescribed for short time use. They are generally not recommended for lengthy use.  Over-the-counter sleep medicines are not recommended for lengthy use. They can be habit forming.  You can promote easier sleeping by making lifestyle changes such as:  Using relaxation techniques that help with breathing and reduce muscle tension.  Exercising earlier in the day.  Changing your diet and the time of your last meal. No night time snacks.  Establish a regular time to go to bed.  Counseling can help with stressful problems and worry.  Soothing music and white noise may be helpful if there are background  noises you cannot remove.  Stop tedious detailed work at least one hour before bedtime. HOME CARE INSTRUCTIONS   Keep a diary. Inform your caregiver about your progress. This includes any medication side effects. See your caregiver regularly. Take note of:  Times when you are asleep.  Times when you are awake during the night.  The quality of your sleep.  How you feel the next day. This information will help your caregiver care for you.  Get out of bed if you are still awake after 15 minutes. Read or do some quiet activity. Keep the lights down. Wait until you feel sleepy and go back to bed.  Keep regular sleeping and waking hours. Avoid naps.  Exercise regularly.  Avoid distractions at bedtime. Distractions include watching television or engaging in any intense or detailed activity like attempting to balance the household checkbook.  Develop a bedtime ritual. Keep a familiar routine of bathing, brushing your teeth, climbing into bed at the same time each night, listening to soothing music. Routines increase the success of falling to sleep faster.  Use relaxation techniques. This can be using breathing and muscle tension release routines. It can also include visualizing peaceful scenes. You can also help control troubling or intruding thoughts by keeping your mind occupied with boring or repetitive thoughts like the old concept of counting sheep. You can make it more creative like imagining planting one beautiful flower after another in your backyard garden.  During your day, work to eliminate stress. When this is not possible use some of the previous suggestions to help reduce the anxiety that accompanies stressful situations. MAKE SURE YOU:   Understand these instructions.  Will watch your condition.  Will get help right away if you are not doing well or get worse. Document Released: 07/18/2000 Document Revised: 10/13/2011 Document Reviewed: 08/18/2007 [facility] Patient  Information 2015 [place], [place]. This information is not intended to replace advice given to you by your health care provider. Make sure you discuss any questions you have with your health care provider.

## 2015-05-02 ENCOUNTER — Encounter: Payer: Medicare Other | Attending: Family Medicine

## 2015-05-02 ENCOUNTER — Encounter: Payer: Self-pay | Admitting: Family Medicine

## 2015-05-02 DIAGNOSIS — E119 Type 2 diabetes mellitus without complications: Secondary | ICD-10-CM

## 2015-05-02 DIAGNOSIS — Z713 Dietary counseling and surveillance: Secondary | ICD-10-CM | POA: Insufficient documentation

## 2015-05-02 NOTE — Progress Notes (Signed)
Appt start time: 0900 end time:  1000.  Patient was seen on 05/02/2015 for a review of the series of three diabetes self-management courses at the [facility]. The following learning objectives were met by the patient during this class:  . Reviewed blood glucose monitoring and interpretation including the recommended target ranges and Hgb A1c.  . Reviewed on carb counting, importance of regularly scheduled meals/snacks, and meal planning.  . Reviewed the effects of physical activity on glucose levels and long-term glucose control.  Recommended goal of 150 minutes of physical activity/week. . Reviewed patient medications and discussed role of medication on blood glucose and possible side effects. . Discussed strategies to manage stress, psychosocial issues, and other obstacles to diabetes management. . Encouraged moderate weight reduction to improve glucose levels.   . Reviewed short-term complications: hyper- and hypo-glycemia.  Discussed causes, symptoms, and treatment options. . Reviewed prevention, detection, and treatment of long-term complications.  Discussed the role of prolonged elevated glucose levels on body systems.  Goals:  Follow Diabetes Meal Plan as instructed  Eat 3 meals and 2 snacks, every 3-5 hrs  Limit carbohydrate intake to 45 grams carbohydrate/meal Limit carbohydrate intake to 15 grams carbohydrate/snack Add lean protein foods to meals/snacks  Monitor glucose levels as instructed by your doctor  Aim for goal of 15-30 mins of physical activity daily as tolerated  Bring food record and glucose log to your next nutrition visit  

## 2015-05-11 ENCOUNTER — Other Ambulatory Visit: Payer: Self-pay | Admitting: Family Medicine

## 2015-05-14 ENCOUNTER — Telehealth: Payer: Self-pay | Admitting: Family Medicine

## 2015-05-14 NOTE — Telephone Encounter (Signed)
LEFT MESSAGE WITH SISTER WITH PATIENT NEW TIME WITH DR Carlota Raspberry ON 06/25/15 AT 10:15

## 2015-05-14 NOTE — Telephone Encounter (Signed)
LEFT MESSAGE ON MACHINE WITH PATIENT NEW TIME WITH DR Carlota Raspberry ON 06/25/15 AT 10:15

## 2015-06-25 ENCOUNTER — Encounter: Payer: Self-pay | Admitting: Family Medicine

## 2015-06-25 ENCOUNTER — Ambulatory Visit (INDEPENDENT_AMBULATORY_CARE_PROVIDER_SITE_OTHER): Payer: Medicare Other | Admitting: Family Medicine

## 2015-06-25 VITALS — BP 130/74 | HR 66 | Temp 98.4°F | Resp 16 | Ht 65.5 in | Wt 182.6 lb

## 2015-06-25 DIAGNOSIS — E119 Type 2 diabetes mellitus without complications: Secondary | ICD-10-CM | POA: Diagnosis not present

## 2015-06-25 DIAGNOSIS — Z23 Encounter for immunization: Secondary | ICD-10-CM

## 2015-06-25 DIAGNOSIS — G4700 Insomnia, unspecified: Secondary | ICD-10-CM

## 2015-06-25 LAB — HEMOGLOBIN A1C: HEMOGLOBIN A1C: 7.8 % — AB (ref 4.0–6.0)

## 2015-06-25 LAB — GLUCOSE, POCT (MANUAL RESULT ENTRY): POC GLUCOSE: 200 mg/dL — AB (ref 70–99)

## 2015-06-25 LAB — POCT GLYCOSYLATED HEMOGLOBIN (HGB A1C): HEMOGLOBIN A1C: 7.8

## 2015-06-25 MED ORDER — SUVOREXANT 10 MG PO TABS: 10.0000 mg | ORAL_TABLET | Freq: Every evening | ORAL | Status: DC | PRN

## 2015-06-25 NOTE — Progress Notes (Addendum)
Subjective:    Patient ID: William Oneill, male    DOB: January 17, 1946, 69 y.o.   MRN: [id number] This chart was scribed for William Staggers, MD by Littie Deeds, Medical Scribe. This patient was seen in Room 27 and the patient's care was started at 10:41 AM.    HPI HPI Comments: William Oneill is a 69 y.o. male who presents to the [facility] for a follow-up.  Diabetes: Blood sugar at his September visit was 133. Currently taking metformin 500 mg BID. He is also on Lipitor for HLD and aspirin once a day. He has been getting blood sugar readings in the 130s. He did have his eyes checked in December. Patient has been walking in the mornings and sometimes in the afternoons. He eats 3 meals a day. He states occasionally drinking soda and sweet tea. He has been drinking Boost. Lab Results  Component Value Date   HGBA1C 6.8 03/19/2015  Body mass index is 29.91 kg/(m^2).  Wt Readings from Last 3 Encounters:  06/25/15 182 lb 9.6 oz (82.827 kg)  04/24/15 181 lb (82.101 kg)  04/17/15 177 lb (80.287 kg)   Lab Results  Component Value Date   MICROALBUR 0.7 12/11/2014    Lab Results  Component Value Date   CHOL 119* 03/19/2015   HDL 41 03/19/2015   LDLCALC 61 03/19/2015   TRIG 85 03/19/2015   CHOLHDL 2.9 03/19/2015    Insomnia: See last office visit. He had a sleep study in July without any signs of sleep apnea. Difficulty staying asleep without improvement in sleep hygiene changes. We started Belsomra at last visit. He was continued on trazodone 100 mg qhs. The Belsomra has been helping him sleep better and feel more rested. The cost has not been too bad for him. Patient denies side effects, including sleepwalking.  Health maintenance: Patient has never had a colonoscopy and declines colonoscopy at this time, but he will still consider having a colonoscopy done.   Patient Active Problem List   Diagnosis Date Noted  . Generalized weakness 04/11/2015  . Acute purulent  bronchitis 04/11/2015  . Dehydration 04/11/2015  . Nocturia more than twice per night 01/16/2015  . Snoring 01/16/2015  . 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 Obese abdomen 01/16/2015  . Other fatigue 01/16/2015  . Type 2 diabetes mellitus without complication (HCC) 09/05/2014  . Insomnia    Past Medical History  Diagnosis Date  . Insomnia   . Diabetes mellitus without complication (HCC)   . High cholesterol    No past surgical history on file. No Known Allergies Prior to Admission medications   Medication Sig Start Date End Date Taking? Authorizing Provider  acetaminophen (TYLENOL) 325 MG tablet Take 2 tablets (650 mg total) by mouth every 6 (six) hours as needed for mild pain, moderate pain, fever or headache (or Fever >/= 101). 04/13/15  Yes William Etienne, MD  aspirin EC 81 MG tablet Take 81 mg by mouth daily.   Yes Historical Provider, MD  atorvastatin (LIPITOR) 10 MG tablet Take 1 tablet (10 mg total) by mouth daily. 12/21/14  Yes William Flood, MD  blood glucose meter kit and supplies Dispense based on patient and insurance preference. Use up to four times daily as directed. (FOR ICD-9 250.00, 250.01). 12/11/14  Yes William Flood, MD  metFORMIN (GLUCOPHAGE) 500 MG tablet Take 1 tablet (500 mg total) by mouth 2 (two) times daily with a meal. 09/04/14  Yes  Thao P Le, DO  Multiple Vitamin (MULTIVITAMIN WITH MINERALS) TABS tablet Take 1 tablet by mouth daily.   Yes Historical Provider, MD  ONE TOUCH ULTRA TEST test strip USE AS DIRECTED TO TEST UP TO 4 TIMES A DAY 05/14/15  Yes William Agreste, MD  Suvorexant (BELSOMRA) 10 MG TABS Take 10 mg by mouth at bedtime as needed. 04/24/15  Yes William Agreste, MD  traZODone (DESYREL) 50 MG tablet Take 2 tablets (100 mg total) by mouth at bedtime. Patient not taking: Reported on 06/25/2015 04/24/15   William Agreste, MD   Social History   Social History  . Marital Status: Married    Spouse Name: N/A  . Number of  Children: N/A  . Years of Education: N/A   Occupational History  . Not on file.   Social History Main Topics  . Smoking status: Never Smoker   . Smokeless tobacco: Not on file  . Alcohol Use: No  . Drug Use: No  . Sexual Activity: Not on file   Other Topics Concern  . Not on file   Social History Narrative     Review of Systems  Constitutional: Negative for fatigue and unexpected weight change.  Eyes: Negative for visual disturbance.  Respiratory: Negative for cough, chest tightness and shortness of breath.   Cardiovascular: Negative for chest pain, palpitations and leg swelling.  Gastrointestinal: Negative for abdominal pain and blood in stool.  Neurological: Negative for dizziness, light-headedness and headaches.       Objective:   Physical Exam  Constitutional: He is oriented to person, place, and time. He appears well-developed and well-nourished.  HENT:  Head: Normocephalic and atraumatic.  Eyes: EOM are normal. Pupils are equal, round, and reactive to light.  Neck: No JVD present. Carotid bruit is not present.  Cardiovascular: Normal rate, regular rhythm and normal heart sounds.   No murmur heard. Pulmonary/Chest: Effort normal and breath sounds normal. He has no rales.  Musculoskeletal: He exhibits no edema.  Neurological: He is alert and oriented to person, place, and time.  Skin: Skin is warm and dry.  Psychiatric: He has a normal mood and affect.  Vitals reviewed.  Results for orders placed or performed in visit on 06/25/15  Hemoglobin A1c  Result Value Ref Range   Hgb A1c MFr Bld 6.8 (A) 4.0 - 6.0 %  POCT glucose (manual entry)  Result Value Ref Range   POC Glucose 200 (A) 70 - 99 mg/dl  POCT glycosylated hemoglobin (Hb A1C)  Result Value Ref Range   Hemoglobin A1C 7.8     Filed Vitals:   06/25/15 1012  BP: 130/74  Pulse: 66  Temp: 98.4 F (36.9 C)  TempSrc: Oral  Resp: 16  Height: 5' 5.5" (1.664 m)  Weight: 182 lb 9.6 oz (82.827 kg)    SpO2: 97%       Assessment & Plan:   William Oneill is a 69 y.o. male Controlled type 2 diabetes mellitus without complication, without long-term current use of insulin ([place]) - Plan: POCT glucose (manual entry), POCT glycosylated hemoglobin (Hb A1C)  -Discussed avoiding sugar containing beverages, and now that he is on 3 meals a day and as can stop the Boost at this time. We'll continue same doses of medication for now, but recheck levels in 3 months. Sooner if worsened on home readings  Flu vaccine need - Plan: Flu Vaccine QUAD 36+ mos IM  Insomnia - Plan: Suvorexant (BELSOMRA) 10 MG TABS  -Improved  with the summer. No new side effects. Refilled medication.  Need for prophylactic vaccination against Streptococcus pneumoniae (pneumococcus) - Plan: Pneumococcal conjugate vaccine 13-valent IM  Prevnar given, then can give Pneumovax at future visit  Meds ordered this encounter  Medications  . Suvorexant (BELSOMRA) 10 MG TABS    Sig: Take 10 mg by mouth at bedtime as needed.    Dispense:  30 tablet    Refill:  2   Patient Instructions  Avoid sugar containing beverages, continue eating 3 meals per day. Exercise 150 minutes per week. Diabetes control is worse.  If you are eating regular meals, stop the Boost and recheck levels in 3 months to determine if med changes are needed.   Flu and first pneumonia vaccine given today.   Ok to continue Belsomra for now for sleep as needed.   Let me know when I can refer you to gastroenterologist for colon cancer screening, as you are overdue, and I do recommend it as soon as possible. Let me know if you  have any questions.   Recheck in 3 months.  Return to the clinic or go to the nearest emergency room if any of your symptoms worsen or new symptoms occur.     I personally performed the services described in this documentation, which was scribed in my presence. The recorded information has been reviewed and considered, and addended by me as  needed.    By signing my name below, I, Zola Button, attest that this documentation has been prepared under the direction and in the presence of [name], MD.  Electronically Signed: Zola Button, Medical Scribe. 06/25/2015. 10:41 AM.

## 2015-06-25 NOTE — Patient Instructions (Addendum)
Avoid sugar containing beverages, continue eating 3 meals per day. Exercise 150 minutes per week. Diabetes control is worse.  If you are eating regular meals, stop the Boost and recheck levels in 3 months to determine if med changes are needed.   Flu and first pneumonia vaccine given today.   Ok to continue Belsomra for now for sleep as needed.   Let me know when I can refer you to gastroenterologist for colon cancer screening, as you are overdue, and I do recommend it as soon as possible. Let me know if you  have any questions.   Recheck in 3 months.  Return to the clinic or go to the nearest emergency room if any of your symptoms worsen or new symptoms occur.

## 2015-07-05 ENCOUNTER — Encounter: Payer: Self-pay | Admitting: Family Medicine

## 2015-07-11 LAB — HM DIABETES EYE EXAM

## 2015-08-16 ENCOUNTER — Ambulatory Visit (INDEPENDENT_AMBULATORY_CARE_PROVIDER_SITE_OTHER): Payer: Medicare Other | Admitting: Family Medicine

## 2015-08-16 VITALS — BP 116/68 | HR 68 | Temp 97.6°F | Resp 18 | Ht 65.5 in | Wt 181.0 lb

## 2015-08-16 DIAGNOSIS — J069 Acute upper respiratory infection, unspecified: Secondary | ICD-10-CM

## 2015-08-16 DIAGNOSIS — G4700 Insomnia, unspecified: Secondary | ICD-10-CM

## 2015-08-16 DIAGNOSIS — R059 Cough, unspecified: Secondary | ICD-10-CM

## 2015-08-16 DIAGNOSIS — R05 Cough: Secondary | ICD-10-CM

## 2015-08-16 MED ORDER — SUVOREXANT 10 MG PO TABS: 10.0000 mg | ORAL_TABLET | Freq: Every evening | ORAL | Status: DC | PRN

## 2015-08-16 MED ORDER — HYDROCODONE-HOMATROPINE 5-1.5 MG/5ML PO SYRP: ORAL_SOLUTION | ORAL | Status: DC

## 2015-08-16 NOTE — Progress Notes (Signed)
Subjective:  By signing my name below, I, William Oneill, attest that this documentation has been prepared under the direction and in the presence of [name], MD.  Leandra Kern, Medical Scribe. 08/16/2015.  9:26 AM. I personally performed the services described in this documentation, which was scribed in my presence. The recorded information has been reviewed and considered, and addended by me as needed.      Patient ID: William Oneill, male    DOB: 1946-05-05, 70 y.o.   MRN: [id number]  Chief Complaint  Patient presents with  . Sore Throat    since yesterday   . Cough  . Nasal Congestion  . Chills  . Generalized Body Aches  . Medication Refill    suvorexant     HPI HPI Comments: William Oneill is a 70 y.o. male who presents to [facility] complaining of flu-like symptoms, gradual onset yesterday.  Pt reports that has he associated symptoms of sneezing, cough, congestion, myalgia, chills, eye watering, and sore throat. Pt notes sleep disturbance secondary to these symptoms. He took Tylenol for the symptoms. Pt is UTD with the flu vaccine.  He denies fever, shortness of breath, change in appetite, or sick contact.   Insomnia: See previous notes. He had a sleep study in July of last year showing no sign of OSA. He also uses trazodone 100 mg qd, but had improved with the use of belsomra. He is currently requesting a refill for the belsomra mediation. Pt reports finding relief with it. He notes that he is not taking the trazodone anymore.    Patient Active Problem List   Diagnosis Date Noted  . Generalized weakness 04/11/2015  . Acute purulent bronchitis 04/11/2015  . Dehydration 04/11/2015  . Nocturia more than twice per night 01/16/2015  . Snoring 01/16/2015  . 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 Obese abdomen 01/16/2015  . Other fatigue 01/16/2015  . Type 2 diabetes mellitus without complication ([place]) 53/97/6734  . Insomnia     Past Medical History  Diagnosis Date  . Insomnia   . Diabetes mellitus without complication ([place])   . High cholesterol    History reviewed. No pertinent past surgical history. No Known Allergies Prior to Admission medications   Medication Sig Start Date End Date Taking? Authorizing Provider  acetaminophen (TYLENOL) 325 MG tablet Take 2 tablets (650 mg total) by mouth every 6 (six) hours as needed for mild pain, moderate pain, fever or headache (or Fever >/= 101). 04/13/15  Yes Modena Jansky, MD  aspirin EC 81 MG tablet Take 81 mg by mouth daily.   Yes Historical Provider, MD  atorvastatin (LIPITOR) 10 MG tablet Take 1 tablet (10 mg total) by mouth daily. 12/21/14  Yes [name], MD  blood glucose meter kit and supplies Dispense based on patient and insurance preference. Use up to four times daily as directed. (FOR ICD-9 250.00, 250.01). 12/11/14  Yes [name], MD  metFORMIN (GLUCOPHAGE) 500 MG tablet Take 1 tablet (500 mg total) by mouth 2 (two) times daily with a meal. 09/04/14  Yes Thao P Le, DO  Multiple Vitamin (MULTIVITAMIN WITH MINERALS) TABS tablet Take 1 tablet by mouth daily.   Yes Historical Provider, MD  ONE TOUCH ULTRA TEST test strip USE AS DIRECTED TO TEST UP TO 4 TIMES A DAY 05/14/15  Yes [name], MD  Suvorexant (BELSOMRA) 10 MG TABS Take 10 mg by mouth at bedtime as  needed. 06/25/15  Yes [name], MD  traZODone (DESYREL) 50 MG tablet Take 2 tablets (100 mg total) by mouth at bedtime. Patient not taking: Reported on 06/25/2015 04/24/15   [name], MD   Social History   Social History  . Marital Status: Married    Spouse Name: N/A  . Number of Children: N/A  . Years of Education: N/A   Occupational History  . Not on file.   Social History Main Topics  . Smoking status: Never Smoker   . Smokeless tobacco: Not on file  . Alcohol Use: No  . Drug Use: No  . Sexual Activity: Not on file   Other Topics Concern  . Not on file    Social History Narrative    Review of Systems  Constitutional: Positive for chills. Negative for fever and appetite change.  HENT: Positive for congestion and sore throat.   Eyes: Positive for discharge.  Respiratory: Positive for cough. Negative for shortness of breath.   Musculoskeletal: Positive for myalgias.  Psychiatric/Behavioral: Negative for sleep disturbance.      Objective:   Physical Exam  Constitutional: He is oriented to person, place, and time. He appears well-developed and well-nourished. No distress.  HENT:  Head: Normocephalic and atraumatic.  Right Ear: Tympanic membrane, external ear and ear canal normal.  Left Ear: Tympanic membrane, external ear and ear canal normal.  Nose: No rhinorrhea.  Mouth/Throat: Oropharynx is clear and moist and mucous membranes are normal. No oropharyngeal exudate or posterior oropharyngeal erythema.  Cerumen in the BL canals, but visualized TM's are clear. Sinuses are non tender.   Eyes: Conjunctivae and EOM are normal. Pupils are equal, round, and reactive to light.  Neck: Neck supple.  Cardiovascular: Normal rate, regular rhythm, normal heart sounds and intact distal pulses.   No murmur heard. Pulmonary/Chest: Effort normal and breath sounds normal. He has no wheezes. He has no rhonchi. He has no rales.  Abdominal: Soft. There is no tenderness.  Lymphadenopathy:    He has no cervical adenopathy.  Neurological: He is alert and oriented to person, place, and time. No cranial nerve deficit.  Skin: Skin is warm and dry. No rash noted.  Psychiatric: He has a normal mood and affect. His behavior is normal.  Nursing note and vitals reviewed.   Filed Vitals:   08/16/15 0841  BP: 116/68  Pulse: 68  Temp: 97.6 F (36.4 C)  TempSrc: Oral  Resp: 18  Height: 5' 5.5" (1.664 m)  Weight: 181 lb (82.101 kg)  SpO2: 97%      Assessment & Plan:   William Oneill is a 70 y.o. male Insomnia - Plan: Suvorexant (BELSOMRA) 10 MG  TABS  -controlled with Belsomra. Refilled with 3 rf's.   -keep scheduled appt next month  Cough - Plan: HYDROcodone-homatropine (HYCODAN) 5-1.5 MG/5ML syrup Acute upper respiratory infection - Plan: HYDROcodone-homatropine (HYCODAN) 5-1.5 MG/5ML syrup  -suspected early viral illness. No concerning findings on exam.   -symptomatic care, RTC precautions discussed.   Meds ordered this encounter  Medications  . HYDROcodone-homatropine (HYCODAN) 5-1.5 MG/5ML syrup    Sig: 50mby mouth a bedtime as needed for cough.    Dispense:  120 mL    Refill:  0  . Suvorexant (BELSOMRA) 10 MG TABS    Sig: Take 10 mg by mouth at bedtime as needed.    Dispense:  30 tablet    Refill:  3   Patient Instructions  Saline nasal spray atleast 4  times per day, over the counter mucinex or mucinex DM for cough during day, hydrocodone cough syrup at night if needed for cough.  If taking the hydrocodone cough syrup, would not combine with the Belsomra. Drink plenty of fluids and rest as needed.  Return to the clinic or go to the nearest emergency room if any of your symptoms worsen or new symptoms occur.   Upper Respiratory Infection, Adult Most upper respiratory infections (URIs) are a viral infection of the air passages leading to the lungs. A URI affects the nose, throat, and upper air passages. The most common type of URI is nasopharyngitis and is typically referred to as "the common cold." URIs run their course and usually go away on their own. Most of the time, a URI does not require medical attention, but sometimes a bacterial infection in the upper airways can follow a viral infection. This is called a secondary infection. Sinus and middle ear infections are common types of secondary upper respiratory infections. Bacterial pneumonia can also complicate a URI. A URI can worsen asthma and chronic obstructive pulmonary disease (COPD). Sometimes, these complications can require emergency medical care and may be life  threatening.  CAUSES Almost all URIs are caused by viruses. A virus is a type of germ and can spread from one person to another.  RISKS FACTORS You may be at risk for a URI if:   You smoke.   You have chronic heart or lung disease.  You have a weakened defense (immune) system.   You are very young or very old.   You have nasal allergies or asthma.  You work in crowded or poorly ventilated areas.  You work in health care facilities or schools. SIGNS AND SYMPTOMS  Symptoms typically develop 2-3 days after you come in contact with a cold virus. Most viral URIs last 7-10 days. However, viral URIs from the influenza virus (flu virus) can last 14-18 days and are typically more severe. Symptoms may include:   Runny or stuffy (congested) nose.   Sneezing.   Cough.   Sore throat.   Headache.   Fatigue.   Fever.   Loss of appetite.   Pain in your forehead, behind your eyes, and over your cheekbones (sinus pain).  Muscle aches.  DIAGNOSIS  Your health care provider may diagnose a URI by:  Physical exam.  Tests to check that your symptoms are not due to another condition such as:  Strep throat.  Sinusitis.  Pneumonia.  Asthma. TREATMENT  A URI goes away on its own with time. It cannot be cured with medicines, but medicines may be prescribed or recommended to relieve symptoms. Medicines may help:  Reduce your fever.  Reduce your cough.  Relieve nasal congestion. HOME CARE INSTRUCTIONS   Take medicines only as directed by your health care provider.   Gargle warm saltwater or take cough drops to comfort your throat as directed by your health care provider.  Use a warm mist humidifier or inhale steam from a shower to increase air moisture. This may make it easier to breathe.  Drink enough fluid to keep your urine clear or pale yellow.   Eat soups and other clear broths and maintain good nutrition.   Rest as needed.   Return to work when  your temperature has returned to normal or as your health care provider advises. You may need to stay home longer to avoid infecting others. You can also use a face mask and careful hand washing to  prevent spread of the virus.  Increase the usage of your inhaler if you have asthma.   Do not use any tobacco products, including cigarettes, chewing tobacco, or electronic cigarettes. If you need help quitting, ask your health care provider. PREVENTION  The best way to protect yourself from getting a cold is to practice good hygiene.   Avoid oral or hand contact with people with cold symptoms.   Wash your hands often if contact occurs.  There is no clear evidence that vitamin C, vitamin E, echinacea, or exercise reduces the chance of developing a cold. However, it is always recommended to get plenty of rest, exercise, and practice good nutrition.  SEEK MEDICAL CARE IF:   You are getting worse rather than better.   Your symptoms are not controlled by medicine.   You have chills.  You have worsening shortness of breath.  You have brown or red mucus.  You have yellow or brown nasal discharge.  You have pain in your face, especially when you bend forward.  You have a fever.  You have swollen neck glands.  You have pain while swallowing.  You have white areas in the back of your throat. SEEK IMMEDIATE MEDICAL CARE IF:   You have severe or persistent:  Headache.  Ear pain.  Sinus pain.  Chest pain.  You have chronic lung disease and any of the following:  Wheezing.  Prolonged cough.  Coughing up blood.  A change in your usual mucus.  You have a stiff neck.  You have changes in your:  Vision.  Hearing.  Thinking.  Mood. MAKE SURE YOU:   Understand these instructions.  Will watch your condition.  Will get help right away if you are not doing well or get worse.   This information is not intended to replace advice given to you by your health care  provider. Make sure you discuss any questions you have with your health care provider.   Document Released: 01/14/2001 Document Revised: 12/05/2014 Document Reviewed: 10/26/2013 Elsevier Interactive Patient Education [employer].     I personally performed the services described in this documentation, which was scribed in my presence. The recorded information has been reviewed and considered, and addended by me as needed.

## 2015-08-16 NOTE — Patient Instructions (Signed)
Saline nasal spray atleast 4 times per day, over the counter mucinex or mucinex DM for cough during day, hydrocodone cough syrup at night if needed for cough.  If taking the hydrocodone cough syrup, would not combine with the Belsomra. Drink plenty of fluids and rest as needed.  Return to the clinic or go to the nearest emergency room if any of your symptoms worsen or new symptoms occur.   Upper Respiratory Infection, Adult Most upper respiratory infections (URIs) are a viral infection of the air passages leading to the lungs. A URI affects the nose, throat, and upper air passages. The most common type of URI is nasopharyngitis and is typically referred to as "the common cold." URIs run their course and usually go away on their own. Most of the time, a URI does not require medical attention, but sometimes a bacterial infection in the upper airways can follow a viral infection. This is called a secondary infection. Sinus and middle ear infections are common types of secondary upper respiratory infections. Bacterial pneumonia can also complicate a URI. A URI can worsen asthma and chronic obstructive pulmonary disease (COPD). Sometimes, these complications can require emergency medical care and may be life threatening.  CAUSES Almost all URIs are caused by viruses. A virus is a type of germ and can spread from one person to another.  RISKS FACTORS You may be at risk for a URI if:   You smoke.   You have chronic heart or lung disease.  You have a weakened defense (immune) system.   You are very young or very old.   You have nasal allergies or asthma.  You work in crowded or poorly ventilated areas.  You work in health care facilities or schools. SIGNS AND SYMPTOMS  Symptoms typically develop 2-3 days after you come in contact with a cold virus. Most viral URIs last 7-10 days. However, viral URIs from the influenza virus (flu virus) can last 14-18 days and are typically more severe. Symptoms  may include:   Runny or stuffy (congested) nose.   Sneezing.   Cough.   Sore throat.   Headache.   Fatigue.   Fever.   Loss of appetite.   Pain in your forehead, behind your eyes, and over your cheekbones (sinus pain).  Muscle aches.  DIAGNOSIS  Your health care provider may diagnose a URI by:  Physical exam.  Tests to check that your symptoms are not due to another condition such as:  Strep throat.  Sinusitis.  Pneumonia.  Asthma. TREATMENT  A URI goes away on its own with time. It cannot be cured with medicines, but medicines may be prescribed or recommended to relieve symptoms. Medicines may help:  Reduce your fever.  Reduce your cough.  Relieve nasal congestion. HOME CARE INSTRUCTIONS   Take medicines only as directed by your health care provider.   Gargle warm saltwater or take cough drops to comfort your throat as directed by your health care provider.  Use a warm mist humidifier or inhale steam from a shower to increase air moisture. This may make it easier to breathe.  Drink enough fluid to keep your urine clear or pale yellow.   Eat soups and other clear broths and maintain good nutrition.   Rest as needed.   Return to work when your temperature has returned to normal or as your health care provider advises. You may need to stay home longer to avoid infecting others. You can also use a face mask and  careful hand washing to prevent spread of the virus.  Increase the usage of your inhaler if you have asthma.   Do not use any tobacco products, including cigarettes, chewing tobacco, or electronic cigarettes. If you need help quitting, ask your health care provider. PREVENTION  The best way to protect yourself from getting a cold is to practice good hygiene.   Avoid oral or hand contact with people with cold symptoms.   Wash your hands often if contact occurs.  There is no clear evidence that vitamin C, vitamin E, echinacea, or  exercise reduces the chance of developing a cold. However, it is always recommended to get plenty of rest, exercise, and practice good nutrition.  SEEK MEDICAL CARE IF:   You are getting worse rather than better.   Your symptoms are not controlled by medicine.   You have chills.  You have worsening shortness of breath.  You have brown or red mucus.  You have yellow or brown nasal discharge.  You have pain in your face, especially when you bend forward.  You have a fever.  You have swollen neck glands.  You have pain while swallowing.  You have white areas in the back of your throat. SEEK IMMEDIATE MEDICAL CARE IF:   You have severe or persistent:  Headache.  Ear pain.  Sinus pain.  Chest pain.  You have chronic lung disease and any of the following:  Wheezing.  Prolonged cough.  Coughing up blood.  A change in your usual mucus.  You have a stiff neck.  You have changes in your:  Vision.  Hearing.  Thinking.  Mood. MAKE SURE YOU:   Understand these instructions.  Will watch your condition.  Will get help right away if you are not doing well or get worse.   This information is not intended to replace advice given to you by your health care provider. Make sure you discuss any questions you have with your health care provider.   Document Released: 01/14/2001 Document Revised: 12/05/2014 Document Reviewed: 10/26/2013 Elsevier Interactive Patient Education [employer].

## 2015-09-10 ENCOUNTER — Other Ambulatory Visit: Payer: Self-pay | Admitting: Family Medicine

## 2015-09-12 ENCOUNTER — Encounter (HOSPITAL_COMMUNITY): Payer: Self-pay | Admitting: Emergency Medicine

## 2015-09-12 ENCOUNTER — Emergency Department (HOSPITAL_COMMUNITY): Payer: Medicare Other

## 2015-09-12 ENCOUNTER — Emergency Department (HOSPITAL_COMMUNITY)
Admission: EM | Admit: 2015-09-12 | Discharge: 2015-09-13 | Disposition: A | Discharge status: Home / Self Care | Payer: Medicare Other | Attending: Emergency Medicine | Admitting: Emergency Medicine

## 2015-09-12 DIAGNOSIS — J069 Acute upper respiratory infection, unspecified: Secondary | ICD-10-CM | POA: Diagnosis not present

## 2015-09-12 DIAGNOSIS — Z7982 Long term (current) use of aspirin: Secondary | ICD-10-CM | POA: Diagnosis not present

## 2015-09-12 DIAGNOSIS — E7800 Pure hypercholesterolemia, unspecified: Secondary | ICD-10-CM | POA: Diagnosis not present

## 2015-09-12 DIAGNOSIS — R1912 Hyperactive bowel sounds: Secondary | ICD-10-CM | POA: Diagnosis not present

## 2015-09-12 DIAGNOSIS — R197 Diarrhea, unspecified: Secondary | ICD-10-CM | POA: Insufficient documentation

## 2015-09-12 DIAGNOSIS — Z7984 Long term (current) use of oral hypoglycemic drugs: Secondary | ICD-10-CM | POA: Diagnosis not present

## 2015-09-12 DIAGNOSIS — Z79899 Other long term (current) drug therapy: Secondary | ICD-10-CM | POA: Insufficient documentation

## 2015-09-12 DIAGNOSIS — G4700 Insomnia, unspecified: Secondary | ICD-10-CM | POA: Insufficient documentation

## 2015-09-12 DIAGNOSIS — E119 Type 2 diabetes mellitus without complications: Secondary | ICD-10-CM | POA: Insufficient documentation

## 2015-09-12 DIAGNOSIS — R05 Cough: Secondary | ICD-10-CM | POA: Diagnosis present

## 2015-09-12 LAB — RAPID STREP SCREEN (MED CTR MEBANE ONLY): STREPTOCOCCUS, GROUP A SCREEN (DIRECT): NEGATIVE

## 2015-09-12 MED ORDER — ACETAMINOPHEN 500 MG PO TABS
1000.0000 mg | ORAL_TABLET | Freq: Once | ORAL | Status: AC
  Administered 2015-09-12: 1000 mg via ORAL
  Filled 2015-09-12: qty 2

## 2015-09-12 NOTE — ED Notes (Signed)
Pt in radiology 

## 2015-09-12 NOTE — ED Provider Notes (Addendum)
CSN: [id number]     Arrival date & time 09/12/15  2212 History   By signing my name below, I, Leelan Rajewski, attest that this documentation has been prepared under the direction and in the presence of Mozelle Remlinger, MD.  Electronically Signed: Forrestine Him, ED Scribe. 09/12/2015. 11:30 PM.   Chief Complaint  Patient presents with  . Influenza   Patient is a 70 y.o. male presenting with flu symptoms. The history is provided by the patient. No language interpreter was used.  Influenza Presenting symptoms: cough, diarrhea, myalgias and sore throat   Presenting symptoms: no headaches, no nausea, no rhinorrhea, no shortness of breath and no vomiting   Cough:    Cough characteristics:  Non-productive   Severity:  Mild   Onset quality:  Gradual   Duration:  3 days   Timing:  Sporadic   Progression:  Unchanged   Chronicity:  New Diarrhea:    Quality:  Watery   Severity:  Mild   Timing:  Rare   Progression:  Resolved Myalgias:    Location:  Generalized   Quality:  Aching   Severity:  Moderate   Onset quality:  Gradual   Duration:  3 days   Timing:  Constant Severity:  Moderate Onset quality:  Gradual Duration:  2 days Progression:  Unchanged Chronicity:  New Relieved by:  None tried Worsened by:  Nothing tried Ineffective treatments:  None tried Associated symptoms: decreased appetite, mental status change and nasal congestion   Associated symptoms: no chills, no ear pain, no neck stiffness and no witnessed syncope   Risk factors: diabetes   Risk factors: no sick contacts     HPI Comments: SOPHIE QUILES is a 70 y.o. male with a PMHx of DM and high cholesterol who presents to the Emergency Department here for flu-like symptoms this evening. Pt reports constant, ongoing generalized body aches, decreased appetite, sore throat, cough, congestion, fatigue, diarrhea, and fever of 101 yesterday x 2 days. 2 episodes of diarrhea reported in last 24 hours. No aggravating or alleviating  factors at this time. No OTC medications or home remedies attempted prior to arrival. No recent nausea, vomiting, abdominal pain, chest pain, or shortness of breath. No known sick contacts. Mr. Lassen was evaluated on 1/12 by his PCP for similar symptoms. At that time, pt was treated with Hycodan and Belsomra. He was safely discharged home and encouraged to try saline nasal sprays 4 times daily, OTC Mucinex, and prescribed hydrocodone cough syrup at night time.  PCP: [name], MD    Past Medical History  Diagnosis Date  . Insomnia   . Diabetes mellitus without complication ([place])   . High cholesterol    History reviewed. No pertinent past surgical history. Family History  Problem Relation Age of Onset  . Heart disease Father   . Heart attack Father   . Heart disease Sister   . Diabetes Sister   . Stroke Mother    Social History  Substance Use Topics  . Smoking status: Never Smoker   . Smokeless tobacco: None  . Alcohol Use: No    Review of Systems  Constitutional: Positive for appetite change and decreased appetite. Negative for chills.  HENT: Positive for congestion and sore throat. Negative for drooling, ear pain and rhinorrhea.   Respiratory: Positive for cough. Negative for shortness of breath.   Cardiovascular: Negative for chest pain.  Gastrointestinal: Positive for diarrhea. Negative for nausea, vomiting and abdominal pain.  Musculoskeletal: Positive for myalgias. Negative  for neck stiffness.  Neurological: Negative for headaches.  Psychiatric/Behavioral: Negative for confusion.  All other systems reviewed and are negative.     Allergies  Review of patient's allergies indicates no known allergies.  Home Medications   Prior to Admission medications   Medication Sig Start Date End Date Taking? Authorizing Provider  acetaminophen (TYLENOL) 325 MG tablet Take 2 tablets (650 mg total) by mouth every 6 (six) hours as needed for mild pain, moderate pain, fever or  headache (or Fever >/= 101). 04/13/15  Yes Modena Jansky, MD  aspirin EC 81 MG tablet Take 81 mg by mouth daily.   Yes Historical Provider, MD  atorvastatin (LIPITOR) 10 MG tablet Take 1 tablet (10 mg total) by mouth daily. 12/21/14  Yes [name], MD  chlorpheniramine (CHLOR-TRIMETON) 4 MG tablet Take 4 mg by mouth 2 (two) times daily as needed for allergies.   Yes Historical Provider, MD  menthol-cetylpyridinium (CEPACOL) 3 MG lozenge Take 1 lozenge by mouth as needed for sore throat.   Yes Historical Provider, MD  metFORMIN (GLUCOPHAGE) 500 MG tablet TAKE 1 TABLET BY MOUTH TWICE A DAY 09/11/15  Yes [name], MD  Multiple Vitamin (MULTIVITAMIN WITH MINERALS) TABS tablet Take 1 tablet by mouth daily.   Yes Historical Provider, MD  blood glucose meter kit and supplies Dispense based on patient and insurance preference. Use up to four times daily as directed. (FOR ICD-9 250.00, 250.01). 12/11/14   [name], MD  HYDROcodone-homatropine [facility]) 5-1.5 MG/5ML syrup 19mby mouth a bedtime as needed for cough. Patient not taking: Reported on 09/12/2015 08/16/15   JWendie Agreste MD  ONE TOUCH ULTRA TEST test strip USE AS DIRECTED TO TEST UP TO 4 TIMES A DAY 05/14/15   JWendie Agreste MD  Suvorexant (BELSOMRA) 10 MG TABS Take 10 mg by mouth at bedtime as needed. Patient not taking: Reported on 09/12/2015 08/16/15   JWendie Agreste MD  traZODone (DESYREL) 50 MG tablet Take 2 tablets (100 mg total) by mouth at bedtime. Patient not taking: Reported on 06/25/2015 04/24/15   JWendie Agreste MD   Triage Vitals: BP 146/83 mmHg  Pulse 81  Temp(Src) 99.8 F (37.7 C) (Oral)  Resp 19  Ht '5\' 8"'$  (1.727 m)  Wt 180 lb (81.647 kg)  BMI 27.38 kg/m2  SpO2 94%   Physical Exam  Constitutional: He is oriented to person, place, and time. He appears well-developed and well-nourished.  HENT:  Head: Normocephalic and atraumatic.  Mouth/Throat: Oropharynx is clear and moist. No oropharyngeal  exudate.  No uvular swelling  Easily displaceable trachea No phonation   Eyes: EOM are normal. Pupils are equal, round, and reactive to light.  Neck: Normal range of motion. Neck supple.  Intact phonation no pain with displacement of the trachea  Cardiovascular: Normal rate, regular rhythm, normal heart sounds and intact distal pulses.   Pulmonary/Chest: Effort normal and breath sounds normal. No stridor. No respiratory distress. He has no wheezes. He has no rales.  Abdominal: Soft. He exhibits no distension and no mass. There is no tenderness. There is no rebound and no guarding.  Hyperactive bowel sounds   Musculoskeletal: Normal range of motion.  Lymphadenopathy:    He has no cervical adenopathy.  Neurological: He is alert and oriented to person, place, and time. He has normal reflexes.  Skin: Skin is warm and dry. He is not diaphoretic.  Psychiatric: He has a normal mood and affect. Judgment normal.  Nursing note and vitals reviewed.   ED Course  Procedures (including critical care time)  DIAGNOSTIC STUDIES: Oxygen Saturation is 94% on RA, adequate by my interpretation.    COORDINATION OF CARE: 11:18 PM- Will order rapid strep screen and CXR. Will give Tylenol. Discussed treatment plan with pt at bedside and pt agreed to plan.     Labs Review Labs Reviewed - No data to display  Imaging Review No results found. I have personally reviewed and evaluated these images and lab results as part of my medical decision-making.   EKG Interpretation None      MDM   Final diagnoses:  None   Based on CENTOR criteria no indication for further testing or treatment  Lungs are clear, no fever, no tachypnea.  Highly doubt PNA.  Main complaint is sore throat.  Will treat symptomatically.  Strict return precautions given  I personally performed the services described in this documentation, which was scribed in my presence. The recorded information has been reviewed and is accurate.      Veatrice Kells, MD 09/13/15 [id number]  Veatrice Kells, MD 09/13/15 [phone]

## 2015-09-12 NOTE — ED Notes (Signed)
Pt c/o fever, body aches, fatigue, decreased appetite, sore throat, cough x 48 hours.

## 2015-09-13 MED ORDER — KETOROLAC TROMETHAMINE 60 MG/2ML IM SOLN
60.0000 mg | Freq: Once | INTRAMUSCULAR | Status: AC
  Administered 2015-09-13: 60 mg via INTRAMUSCULAR
  Filled 2015-09-13: qty 2

## 2015-09-13 MED ORDER — FLUTICASONE PROPIONATE 50 MCG/ACT NA SUSP: 2.0000 | Freq: Every day | NASAL | Status: DC

## 2015-09-13 MED ORDER — NAPROXEN 500 MG PO TABS: 500.0000 mg | ORAL_TABLET | Freq: Two times a day (BID) | ORAL | Status: DC

## 2015-09-13 MED ORDER — METHOCARBAMOL 500 MG PO TABS
1000.0000 mg | ORAL_TABLET | Freq: Once | ORAL | Status: AC
  Administered 2015-09-13: 1000 mg via ORAL
  Filled 2015-09-13: qty 2

## 2015-09-13 MED ORDER — BENZONATATE 100 MG PO CAPS: 100.0000 mg | ORAL_CAPSULE | Freq: Three times a day (TID) | ORAL | Status: DC

## 2015-09-13 NOTE — ED Notes (Signed)
Provided ice water

## 2015-09-13 NOTE — Discharge Instructions (Signed)

## 2015-09-15 LAB — CULTURE, GROUP A STREP (THRC)

## 2015-09-16 ENCOUNTER — Ambulatory Visit (INDEPENDENT_AMBULATORY_CARE_PROVIDER_SITE_OTHER): Payer: Medicare Other | Admitting: Family Medicine

## 2015-09-16 ENCOUNTER — Ambulatory Visit (INDEPENDENT_AMBULATORY_CARE_PROVIDER_SITE_OTHER): Payer: Medicare Other

## 2015-09-16 VITALS — BP 122/60 | HR 88 | Temp 99.0°F | Resp 16 | Ht 68.0 in | Wt 184.0 lb

## 2015-09-16 DIAGNOSIS — J189 Pneumonia, unspecified organism: Secondary | ICD-10-CM

## 2015-09-16 DIAGNOSIS — R05 Cough: Secondary | ICD-10-CM | POA: Diagnosis not present

## 2015-09-16 DIAGNOSIS — R059 Cough, unspecified: Secondary | ICD-10-CM

## 2015-09-16 DIAGNOSIS — R509 Fever, unspecified: Secondary | ICD-10-CM

## 2015-09-16 LAB — POCT CBC
GRANULOCYTE PERCENT: 65 % (ref 37–80)
HEMATOCRIT: 39.6 % — AB (ref 43.5–53.7)
HEMOGLOBIN: 13.7 g/dL — AB (ref 14.1–18.1)
LYMPH, POC: 2.2 (ref 0.6–3.4)
MCH, POC: 30.3 pg (ref 27–31.2)
MCHC: 34.7 g/dL (ref 31.8–35.4)
MCV: 87.4 fL (ref 80–97)
MID (cbc): 0.5 (ref 0–0.9)
MPV: 7.7 fL (ref 0–99.8)
POC GRANULOCYTE: 5.1 (ref 2–6.9)
POC LYMPH PERCENT: 28.7 %L (ref 10–50)
POC MID %: 6.3 %M (ref 0–12)
Platelet Count, POC: 258 10*3/uL (ref 142–424)
RBC: 4.53 M/uL — AB (ref 4.69–6.13)
RDW, POC: 12.5 %
WBC: 7.8 10*3/uL (ref 4.6–10.2)

## 2015-09-16 MED ORDER — HYDROCODONE-HOMATROPINE 5-1.5 MG/5ML PO SYRP: ORAL_SOLUTION | ORAL | Status: DC

## 2015-09-16 MED ORDER — AZITHROMYCIN 250 MG PO TABS: ORAL_TABLET | ORAL | Status: DC

## 2015-09-16 NOTE — Patient Instructions (Addendum)
Because you received an x-ray today, you will receive an invoice from [facility]. Please contact [facility] at [phone] with questions or concerns regarding your invoice. Our billing staff will not be able to assist you with those questions.  Start azithromycin for possible early pneumonia. Continue the [employer] as needed for cough during the day or can try the Mucinex DM that was given as a sample. Hydrocodone cough syrup up to every 6 hours if you need it. Primarily the hydrocodone can help you sleep. If you are wheezing, short of breath, or worsening in the next few days, return here or emergency room. If you're not improving within the next 3 days, follow-up with me or another provider.  Return to the clinic or go to the nearest emergency room if any of your symptoms worsen or new symptoms occur.  Community-Acquired Pneumonia, Adult Pneumonia is an infection of the lungs. There are different types of pneumonia. One type can develop while a person is in a hospital. A different type, called community-acquired pneumonia, develops in people who are not, or have not recently been, in the hospital or other health care facility.  CAUSES Pneumonia may be caused by bacteria, viruses, or funguses. Community-acquired pneumonia is often caused by Streptococcus pneumonia bacteria. These bacteria are often passed from one person to another by breathing in droplets from the cough or sneeze of an infected person. RISK FACTORS The condition is more likely to develop in:  People who havechronic diseases, such as chronic obstructive pulmonary disease (COPD), asthma, congestive heart failure, cystic fibrosis, diabetes, or kidney disease.  People who haveearly-stage or late-stage HIV.  People who havesickle cell disease.  People who havehad their spleen removed (splenectomy).  People who havepoor Human resources officer.  People who havemedical conditions that increase the risk of  breathing in (aspirating) secretions their own mouth and nose.   People who havea weakened immune system (immunocompromised).  People who smoke.  People whotravel to areas where pneumonia-causing germs commonly exist.  People whoare around animal habitats or animals that have pneumonia-causing germs, including birds, bats, rabbits, cats, and farm animals. SYMPTOMS Symptoms of this condition include:  Adry cough.  A wet (productive) cough.  Fever.  Sweating.  Chest pain, especially when breathing deeply or coughing.  Rapid breathing or difficulty breathing.  Shortness of breath.  Shaking chills.  Fatigue.  Muscle aches. DIAGNOSIS Your health care provider will take a medical history and perform a physical exam. You may also have other tests, including:  Imaging studies of your chest, including X-rays.  Tests to check your blood oxygen level and other blood gases.  Other tests on blood, mucus (sputum), fluid around your lungs (pleural fluid), and urine. If your pneumonia is severe, other tests may be done to identify the specific cause of your illness. TREATMENT The type of treatment that you receive depends on many factors, such as the cause of your pneumonia, the medicines you take, and other medical conditions that you have. For most adults, treatment and recovery from pneumonia may occur at home. In some cases, treatment must happen in a hospital. Treatment may include:  Antibiotic medicines, if the pneumonia was caused by bacteria.  Antiviral medicines, if the pneumonia was caused by a virus.  Medicines that are given by mouth or through an IV tube.  Oxygen.  Respiratory therapy. Although rare, treating severe pneumonia may include:  Mechanical ventilation. This is done if you are not breathing well on your own and you cannot  maintain a safe blood oxygen level.  Thoracentesis. This procedureremoves fluid around one lung or both lungs to help you  breathe better. HOME CARE INSTRUCTIONS  Take over-the-counter and prescription medicines only as told by your health care provider.  Only takecough medicine if you are losing sleep. Understand that cough medicine can prevent your body's natural ability to remove mucus from your lungs.  If you were prescribed an antibiotic medicine, take it as told by your health care provider. Do not stop taking the antibiotic even if you start to feel better.  Sleep in a semi-upright position at night. Try sleeping in a reclining chair, or place a few pillows under your head.  Do not use tobacco products, including cigarettes, chewing tobacco, and e-cigarettes. If you need help quitting, ask your health care provider.  Drink enough water to keep your urine clear or pale yellow. This will help to thin out mucus secretions in your lungs. PREVENTION There are ways that you can decrease your risk of developing community-acquired pneumonia. Consider getting a pneumococcal vaccine if:  You are older than 69 years of age.  You are older than 69 years of age and are undergoing cancer treatment, have chronic lung disease, or have other medical conditions that affect your immune system. Ask your health care provider if this applies to you. There are different types and schedules of pneumococcal vaccines. Ask your health care provider which vaccination option is best for you. You may also prevent community-acquired pneumonia if you take these actions:  Get an influenza vaccine every year. Ask your health care provider which type of influenza vaccine is best for you.  Go to the dentist on a regular basis.  Wash your hands often. Use hand sanitizer if soap and water are not available. SEEK MEDICAL CARE IF:  You have a fever.  You are losing sleep because you cannot control your cough with cough medicine. SEEK IMMEDIATE MEDICAL CARE IF:  You have worsening shortness of breath.  You have increased chest  pain.  Your sickness becomes worse, especially if you are an older adult or have a weakened immune system.  You cough up blood.   This information is not intended to replace advice given to you by your health care provider. Make sure you discuss any questions you have with your health care provider.   Document Released: 07/21/2005 Document Revised: 04/11/2015 Document Reviewed: 11/15/2014 Elsevier Interactive Patient Education 2016 Else[employer]  Cough, Adult Coughing is a reflex that clears your throat and your airways. Coughing helps to heal and protect your lungs. It is normal to cough occasionally, but a cough that happens with other symptoms or lasts a long time may be a sign of a condition that needs treatment. A cough may last only 2-3 weeks (acute), or it may last longer than 8 weeks (chronic). CAUSES Coughing is commonly caused by:  Breathing in substances that irritate your lungs.  A viral or bacterial respiratory infection.  Allergies.  Asthma.  Postnasal drip.  Smoking.  Acid backing up from the stomach into the esophagus (gastroesophageal reflux).  Certain medicines.  Chronic lung problems, including COPD (or rarely, lung cancer).  Other medical conditions such as heart failure. HOME CARE INSTRUCTIONS  Pay attention to any changes in your symptoms. Take these actions to help with your discomfort:  Take medicines only as told by your health care provider.  If you were prescribed an antibiotic medicine, take it as told by your health care provider. Do  not stop taking the antibiotic even if you start to feel better.  Talk with your health care provider before you take a cough suppressant medicine.  Drink enough fluid to keep your urine clear or pale yellow.  If the air is dry, use a cold steam vaporizer or humidifier in your bedroom or your home to help loosen secretions.  Avoid anything that causes you to cough at work or at home.  If your cough is worse  at night, try sleeping in a semi-upright position.  Avoid cigarette smoke. If you smoke, quit smoking. If you need help quitting, ask your health care provider.  Avoid caffeine.  Avoid alcohol.  Rest as needed. SEEK MEDICAL CARE IF:   You have new symptoms.  You cough up pus.  Your cough does not get better after 2-3 weeks, or your cough gets worse.  You cannot control your cough with suppressant medicines and you are losing sleep.  You develop pain that is getting worse or pain that is not controlled with pain medicines.  You have a fever.  You have unexplained weight loss.  You have night sweats. SEEK IMMEDIATE MEDICAL CARE IF:  You cough up blood.  You have difficulty breathing.  Your heartbeat is very fast.   This information is not intended to replace advice given to you by your health care provider. Make sure you discuss any questions you have with your health care provider.   Document Released: 01/17/2011 Document Revised: 04/11/2015 Document Reviewed: 09/27/2014 Elsevier Interactive Patient Education [employer].

## 2015-09-16 NOTE — Progress Notes (Addendum)
Subjective:  By signing my name below, I, Raven Small, attest that this documentation has been prepared under the direction and in the presence of [name], MD.  Electronically Signed: Thea Alken, ED Scribe. 09/16/2015. 1:05 PM.   Patient ID: William Oneill, male    DOB: 1945-11-01, 70 y.o.   MRN: [id number]  HPI Chief Complaint  Patient presents with  . Cough    x 1 week, productive    HPI Comments: William Oneill is a 70 y.o. male who presents to the [facility] complaining of a productive cough for 1 week. Hx of DM, insomnia, HLD. He was seen 4 days in the ED, diagnosed with URI. He had CXR with focal right lung base atelectasis less likely pneumonia. Negative strep testing and culture. He was hospitalized 04/11/2015 for 2 days for bronchitis with secondary dehydration  and weakness. Treated with Levaquin. He had prevnar in 06/2015, he has not had pneumovax yet.   Pt states cough has worsened since being seen in the ED. Cough keeps him awake at night. He reports associated SOB with cough and mild wheeze. He reports having fever initially earlier this week but this has resolved.   He has been taking Tessalon pearls and delsym for cough. He did okay with Hycodan in the past but ran out.   Patient Active Problem List   Diagnosis Date Noted  . Generalized weakness 04/11/2015  . Acute purulent bronchitis 04/11/2015  . Dehydration 04/11/2015  . Nocturia more than twice per night 01/16/2015  . Snoring 01/16/2015  . 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 Obese abdomen 01/16/2015  . Other fatigue 01/16/2015  . Type 2 diabetes mellitus without complication ([place]) 03/50/0938  . Insomnia    Past Medical History  Diagnosis Date  . Insomnia   . Diabetes mellitus without complication ([place])   . High cholesterol    No past surgical history on file. No Known Allergies Prior to Admission medications   Medication Sig Start Date End Date Taking?  Authorizing Provider  acetaminophen (TYLENOL) 325 MG tablet Take 2 tablets (650 mg total) by mouth every 6 (six) hours as needed for mild pain, moderate pain, fever or headache (or Fever >/= 101). 04/13/15  Yes Modena Jansky, MD  aspirin EC 81 MG tablet Take 81 mg by mouth daily.   Yes Historical Provider, MD  atorvastatin (LIPITOR) 10 MG tablet Take 1 tablet (10 mg total) by mouth daily. 12/21/14  Yes [name], MD  benzonatate (TESSALON) 100 MG capsule Take 1 capsule (100 mg total) by mouth every 8 (eight) hours. 09/13/15  Yes April Palumbo, MD  blood glucose meter kit and supplies Dispense based on patient and insurance preference. Use up to four times daily as directed. (FOR ICD-9 250.00, 250.01). 12/11/14  Yes [name], MD  chlorpheniramine (CHLOR-TRIMETON) 4 MG tablet Take 4 mg by mouth 2 (two) times daily as needed for allergies.   Yes Historical Provider, MD  menthol-cetylpyridinium (CEPACOL) 3 MG lozenge Take 1 lozenge by mouth as needed for sore throat.   Yes Historical Provider, MD  metFORMIN (GLUCOPHAGE) 500 MG tablet TAKE 1 TABLET BY MOUTH TWICE A DAY 09/11/15  Yes [name], MD  Multiple Vitamin (MULTIVITAMIN WITH MINERALS) TABS tablet Take 1 tablet by mouth daily.   Yes Historical Provider, MD  naproxen (NAPROSYN) 500 MG tablet Take 1 tablet (500 mg total) by mouth 2 (two) times daily. 09/13/15  Yes April  Palumbo, MD  ONE TOUCH ULTRA TEST test strip USE AS DIRECTED TO TEST UP TO 4 TIMES A DAY 05/14/15  Yes [name], MD  fluticasone [facility]) 50 MCG/ACT nasal spray Place 2 sprays into both nostrils daily. 09/13/15   April Palumbo, MD  HYDROcodone-homatropine [facility]) 5-1.5 MG/5ML syrup 42mby mouth a bedtime as needed for cough. Patient not taking: Reported on 09/12/2015 08/16/15   JWendie Agreste MD  Suvorexant (BELSOMRA) 10 MG TABS Take 10 mg by mouth at bedtime as needed. Patient not taking: Reported on 09/12/2015 08/16/15   JWendie Agreste MD  traZODone (DESYREL)  50 MG tablet Take 2 tablets (100 mg total) by mouth at bedtime. Patient not taking: Reported on 06/25/2015 04/24/15   JWendie Agreste MD   Social History   Social History  . Marital Status: Married    Spouse Name: N/A  . Number of Children: N/A  . Years of Education: N/A   Occupational History  . Not on file.   Social History Main Topics  . Smoking status: Never Smoker   . Smokeless tobacco: Not on file  . Alcohol Use: No  . Drug Use: No  . Sexual Activity: Not on file   Other Topics Concern  . Not on file   Social History Narrative   Review of Systems  Constitutional: Positive for fever. Negative for chills.  HENT: Positive for congestion.   Respiratory: Positive for cough, shortness of breath and wheezing.    Objective:   Physical Exam  Constitutional: He is oriented to person, place, and time. He appears well-developed and well-nourished.  HENT:  Head: Normocephalic and atraumatic.  Right Ear: Tympanic membrane, external ear and ear canal normal.  Left Ear: Tympanic membrane, external ear and ear canal normal.  Nose: No rhinorrhea.  Mouth/Throat: Oropharynx is clear and moist and mucous membranes are normal. No oropharyngeal exudate or posterior oropharyngeal erythema.  Eyes: Conjunctivae are normal. Pupils are equal, round, and reactive to light.  Neck: Neck supple.  Cardiovascular: Normal rate, regular rhythm, normal heart sounds and intact distal pulses.  Exam reveals no gallop and no friction rub.   No murmur heard. Pulmonary/Chest: Effort normal. He has no wheezes. He has rhonchi in the left lower field. He has no rales.  Distant coarse breath sound.   Abdominal: Soft. There is no tenderness.  Lymphadenopathy:    He has no cervical adenopathy.  Neurological: He is alert and oriented to person, place, and time.  Skin: Skin is warm and dry. No rash noted.  Psychiatric: He has a normal mood and affect. His behavior is normal.  Vitals reviewed.  Filed  Vitals:   09/16/15 1220  BP: 122/60  Pulse: 88  Temp: 99 F (37.2 C)  TempSrc: Oral  Resp: 16  Height: '5\' 8"'$  (1.727 m)  Weight: 184 lb (83.462 kg)  SpO2: 96%    Results for orders placed or performed in visit on 09/16/15  POCT CBC  Result Value Ref Range   WBC 7.8 4.6 - 10.2 K/uL   Lymph, poc 2.2 0.6 - 3.4   POC LYMPH PERCENT 28.7 10 - 50 %L   MID (cbc) 0.5 0 - 0.9   POC MID % 6.3 0 - 12 %M   POC Granulocyte 5.1 2 - 6.9   Granulocyte percent 65.0 37 - 80 %G   RBC 4.53 (A) 4.69 - 6.13 M/uL   Hemoglobin 13.7 (A) 14.1 - 18.1 g/dL   HCT, POC 39.6 (A)  43.5 - 53.7 %   MCV 87.4 80 - 97 fL   MCH, POC 30.3 27 - 31.2 pg   MCHC 34.7 31.8 - 35.4 g/dL   RDW, POC 12.5 %   Platelet Count, POC 258 142 - 424 K/uL   MPV 7.7 0 - 99.8 fL   Dg Chest 2 View  09/16/2015  CLINICAL DATA:  Persistent cough since Monday. EXAM: CHEST  2 VIEW COMPARISON:  09/12/2015; 04/12/2015; 05/28/2014 FINDINGS: Grossly unchanged borderline enlarged cardiac silhouette and mediastinal contours. Atherosclerotic plaque within the thoracic aorta. The lungs remain hyperexpanded with flattening of the diaphragms and thinning of the biapical pulmonary parenchyma. Grossly unchanged bilateral perihilar heterogeneous opacities favored to represent atelectasis or scar. No new focal airspace opacities. No pleural effusion or pneumothorax. No evidence of edema. Focal concavity involving the sternum is unchanged since the 05/2014 examination. No acute osseous abnormalities. IMPRESSION: Mild lung hyperexpansion without acute cardiopulmonary disease. Electronically Signed   By: Sandi Mariscal M.D.   On: 09/16/2015 13:57   Dg Chest 2 View  09/13/2015  CLINICAL DATA:  70 year old male with fever and cough and congestion. EXAM: CHEST  2 VIEW COMPARISON:  Radiograph dated 04/12/2015 FINDINGS: Two views of the chest demonstrate emphysematous changes of the lungs. There is a focal area of mild interstitial prominence at the right lung base, likely  subsegmental atelectatic changes. Developing pneumonia is less likely. There is no pleural effusion or pneumothorax. Stable cardiac silhouette. No acute osseous pathology. IMPRESSION: Focal right lung base atelectatic change.  Pneumonia is less likely. Electronically Signed   By: Anner Crete M.D.   On: 09/13/2015 00:01    Assessment & Plan:   William Oneill is a 70 y.o. male Cough - Plan: POCT CBC, DG Chest 2 View, HYDROcodone-homatropine (HYCODAN) 5-1.5 MG/5ML syrup  Fever, unspecified - Plan: POCT CBC, DG Chest 2 View  CAP (community acquired pneumonia) - Plan: azithromycin (ZITHROMAX) 250 MG tablet, DISCONTINUED: azithromycin (ZITHROMAX) 250 MG tablet    - possible early community acquired pneumonia,  Versus bronchitis.  - start Z-Pak, continue Tessalon Perles, or Mucinex DM. Hydrocodone cough syrup if needed, but if any shortness of breath, wheezing, or worsening, return for recheck here or ER to evaluate for other causes of symptoms.  Meds ordered this encounter  Medications  . HYDROcodone-homatropine (HYCODAN) 5-1.5 MG/5ML syrup    Sig: 45mby mouth a bedtime as needed for cough.    Dispense:  120 mL    Refill:  0  . DISCONTD: azithromycin (ZITHROMAX) 250 MG tablet    Sig: Take 2 pills by mouth on day 1, then 1 pill by mouth per day on days 2 through 5.    Dispense:  6 tablet    Refill:  0  . azithromycin (ZITHROMAX) 250 MG tablet    Sig: Take 2 pills by mouth on day 1, then 1 pill by mouth per day on days 2 through 5.    Dispense:  6 tablet    Refill:  0   Patient Instructions  Because you received an x-ray today, you will receive an invoice from [facility]. Please contact [facility] at [phone] questions or concerns regarding your invoice. Our billing staff will not be able to assist you with those questions.  Start azithromycin for possible early pneumonia. Continue the [place] needed for cough during the day or can try the Mucinex  DM that was given as a sample. Hydrocodone cough syrup up to every 6  hours if you need it. Primarily the hydrocodone can help you sleep. If you are wheezing, short of breath, or worsening in the next few days, return here or emergency room. If you're not improving within the next 3 days, follow-up with me or another provider.  Return to the clinic or go to the nearest emergency room if any of your symptoms worsen or new symptoms occur.  Community-Acquired Pneumonia, Adult Pneumonia is an infection of the lungs. There are different types of pneumonia. One type can develop while a person is in a hospital. A different type, called community-acquired pneumonia, develops in people who are not, or have not recently been, in the hospital or other health care facility.  CAUSES Pneumonia may be caused by bacteria, viruses, or funguses. Community-acquired pneumonia is often caused by Streptococcus pneumonia bacteria. These bacteria are often passed from one person to another by breathing in droplets from the cough or sneeze of an infected person. RISK FACTORS The condition is more likely to develop in:  People who havechronic diseases, such as chronic obstructive pulmonary disease (COPD), asthma, congestive heart failure, cystic fibrosis, diabetes, or kidney disease.  People who haveearly-stage or late-stage HIV.  People who havesickle cell disease.  People who havehad their spleen removed (splenectomy).  People who havepoor Administrator.  People who havemedical conditions that increase the risk of breathing in (aspirating) secretions their own mouth and nose.   People who havea weakened immune system (immunocompromised).  People who smoke.  People whotravel to areas where pneumonia-causing germs commonly exist.  People whoare around animal habitats or animals that have pneumonia-causing germs, including birds, bats, rabbits, cats, and farm animals. SYMPTOMS Symptoms of this  condition include:  Adry cough.  A wet (productive) cough.  Fever.  Sweating.  Chest pain, especially when breathing deeply or coughing.  Rapid breathing or difficulty breathing.  Shortness of breath.  Shaking chills.  Fatigue.  Muscle aches. DIAGNOSIS Your health care provider will take a medical history and perform a physical exam. You may also have other tests, including:  Imaging studies of your chest, including X-rays.  Tests to check your blood oxygen level and other blood gases.  Other tests on blood, mucus (sputum), fluid around your lungs (pleural fluid), and urine. If your pneumonia is severe, other tests may be done to identify the specific cause of your illness. TREATMENT The type of treatment that you receive depends on many factors, such as the cause of your pneumonia, the medicines you take, and other medical conditions that you have. For most adults, treatment and recovery from pneumonia may occur at home. In some cases, treatment must happen in a hospital. Treatment may include:  Antibiotic medicines, if the pneumonia was caused by bacteria.  Antiviral medicines, if the pneumonia was caused by a virus.  Medicines that are given by mouth or through an IV tube.  Oxygen.  Respiratory therapy. Although rare, treating severe pneumonia may include:  Mechanical ventilation. This is done if you are not breathing well on your own and you cannot maintain a safe blood oxygen level.  Thoracentesis. This procedureremoves fluid around one lung or both lungs to help you breathe better. HOME CARE INSTRUCTIONS  Take over-the-counter and prescription medicines only as told by your health care provider.  Only takecough medicine if you are losing sleep. Understand that cough medicine can prevent your body's natural ability to remove mucus from your lungs.  If you were prescribed an antibiotic medicine, take it as told by  your health care provider. Do not stop  taking the antibiotic even if you start to feel better.  Sleep in a semi-upright position at night. Try sleeping in a reclining chair, or place a few pillows under your head.  Do not use tobacco products, including cigarettes, chewing tobacco, and e-cigarettes. If you need help quitting, ask your health care provider.  Drink enough water to keep your urine clear or pale yellow. This will help to thin out mucus secretions in your lungs. PREVENTION There are ways that you can decrease your risk of developing community-acquired pneumonia. Consider getting a pneumococcal vaccine if:  You are older than 69 years of age.  You are older than 69 years of age and are undergoing cancer treatment, have chronic lung disease, or have other medical conditions that affect your immune system. Ask your health care provider if this applies to you. There are different types and schedules of pneumococcal vaccines. Ask your health care provider which vaccination option is best for you. You may also prevent community-acquired pneumonia if you take these actions:  Get an influenza vaccine every year. Ask your health care provider which type of influenza vaccine is best for you.  Go to the dentist on a regular basis.  Wash your hands often. Use hand sanitizer if soap and water are not available. SEEK MEDICAL CARE IF:  You have a fever.  You are losing sleep because you cannot control your cough with cough medicine. SEEK IMMEDIATE MEDICAL CARE IF:  You have worsening shortness of breath.  You have increased chest pain.  Your sickness becomes worse, especially if you are an older adult or have a weakened immune system.  You cough up blood.   This information is not intended to replace advice given to you by your health care provider. Make sure you discuss any questions you have with your health care provider.   Document Released: 07/21/2005 Document Revised: 04/11/2015 Document Reviewed:  11/15/2014 Elsevier Interactive Patient Education 2016 Else[employer]  Cough, Adult Coughing is a reflex that clears your throat and your airways. Coughing helps to heal and protect your lungs. It is normal to cough occasionally, but a cough that happens with other symptoms or lasts a long time may be a sign of a condition that needs treatment. A cough may last only 2-3 weeks (acute), or it may last longer than 8 weeks (chronic). CAUSES Coughing is commonly caused by:  Breathing in substances that irritate your lungs.  A viral or bacterial respiratory infection.  Allergies.  Asthma.  Postnasal drip.  Smoking.  Acid backing up from the stomach into the esophagus (gastroesophageal reflux).  Certain medicines.  Chronic lung problems, including COPD (or rarely, lung cancer).  Other medical conditions such as heart failure. HOME CARE INSTRUCTIONS  Pay attention to any changes in your symptoms. Take these actions to help with your discomfort:  Take medicines only as told by your health care provider.  If you were prescribed an antibiotic medicine, take it as told by your health care provider. Do not stop taking the antibiotic even if you start to feel better.  Talk with your health care provider before you take a cough suppressant medicine.  Drink enough fluid to keep your urine clear or pale yellow.  If the air is dry, use a cold steam vaporizer or humidifier in your bedroom or your home to help loosen secretions.  Avoid anything that causes you to cough at work or at home.  If your  cough is worse at night, try sleeping in a semi-upright position.  Avoid cigarette smoke. If you smoke, quit smoking. If you need help quitting, ask your health care provider.  Avoid caffeine.  Avoid alcohol.  Rest as needed. SEEK MEDICAL CARE IF:   You have new symptoms.  You cough up pus.  Your cough does not get better after 2-3 weeks, or your cough gets worse.  You cannot control  your cough with suppressant medicines and you are losing sleep.  You develop pain that is getting worse or pain that is not controlled with pain medicines.  You have a fever.  You have unexplained weight loss.  You have night sweats. SEEK IMMEDIATE MEDICAL CARE IF:  You cough up blood.  You have difficulty breathing.  Your heartbeat is very fast.   This information is not intended to replace advice given to you by your health care provider. Make sure you discuss any questions you have with your health care provider.   Document Released: 01/17/2011 Document Revised: 04/11/2015 Document Reviewed: 09/27/2014 Elsevier Interactive Patient Education [employer].     I personally performed the services described in this documentation, which was scribed in my presence. The recorded information has been reviewed and considered, and addended by me as needed.

## 2015-09-17 ENCOUNTER — Encounter: Payer: Self-pay | Admitting: Family Medicine

## 2015-09-17 ENCOUNTER — Telehealth: Payer: Self-pay | Admitting: Family Medicine

## 2015-09-17 NOTE — Telephone Encounter (Signed)
PATIENT RETURNED CALL AND HE IS DECLINING TO HAVE A COLONOSCOPY AFTER GIVING IT SOME THOUGHT.  WILL ASK AGAIN NEXT YEAR.

## 2015-09-17 NOTE — Telephone Encounter (Signed)
LEFT A MESSAGE WITH PATIENTS SISTER FOR HIM TO RETURN CALL.  NEED TO FIND OUT IF HE HAS A COLONOSCOPY.  IF SO WHERE AND WHEN.  IF NOT ASK HIM IF WE MAY ORDER ONE FOR HIM?

## 2015-10-01 ENCOUNTER — Ambulatory Visit: Payer: Medicare Other | Admitting: Family Medicine

## 2015-10-03 ENCOUNTER — Encounter: Payer: Self-pay | Admitting: Family Medicine

## 2015-10-03 ENCOUNTER — Ambulatory Visit (INDEPENDENT_AMBULATORY_CARE_PROVIDER_SITE_OTHER): Payer: Medicare Other | Admitting: Family Medicine

## 2015-10-03 VITALS — BP 119/69 | HR 67 | Temp 97.6°F | Resp 16 | Ht 65.5 in | Wt 181.0 lb

## 2015-10-03 DIAGNOSIS — Z23 Encounter for immunization: Secondary | ICD-10-CM | POA: Diagnosis not present

## 2015-10-03 DIAGNOSIS — E119 Type 2 diabetes mellitus without complications: Secondary | ICD-10-CM | POA: Diagnosis not present

## 2015-10-03 LAB — GLUCOSE, POCT (MANUAL RESULT ENTRY): POC Glucose: 182 mg/dl — AB (ref 70–99)

## 2015-10-03 LAB — POCT GLYCOSYLATED HEMOGLOBIN (HGB A1C): Hemoglobin A1C: 7.7

## 2015-10-03 MED ORDER — METFORMIN HCL 500 MG PO TABS: ORAL_TABLET | ORAL | Status: DC

## 2015-10-03 NOTE — Patient Instructions (Signed)
Diabetes not quite at control - still elevated as seen at last visit. Increase metformin to 2 pills in the morning, 1 at night. Recheck in 3 months. Fasting at that visit for cholesterol testing.

## 2015-10-03 NOTE — Progress Notes (Signed)
Subjective:    Patient ID: William Oneill, male    DOB: Dec 09, 1945, 70 y.o.   MRN: [id number] By signing my name below, I, Zola Button, attest that this documentation has been prepared under the direction and in the presence of [name], MD.  Electronically Signed: Zola Button, Medical Scribe. 10/03/2015. 8:55 AM.  HPI HPI Comments: William Oneill is a 70 y.o. male who presents to the [facility] for a follow-up. He was recently treated for persistent cough in February. Treated with Z-pak for pneumonia. This has improved. Patient denies chest pain, dark/tarry stools, fever, SOB and wheezing.  Diabetes: At that visit, he was on 500 mg metformin BID. Also on Lipitor for HLD and aspirin once a day. Appointment in June with Dr. Katy Fitch for his eyes. Patient denies symptomatic lows. He has not had any blood sugar readings over 200. He does not drink the boost anymore and has avoided drinking sugar-containing beverages.  Lab Results  Component Value Date   HGBA1C 7.8 06/25/2015    Lab Results  Component Value Date   MICROALBUR 0.7 12/11/2014    Wt Readings from Last 3 Encounters:  10/03/15 181 lb (82.101 kg)  09/16/15 184 lb (83.462 kg)  09/12/15 180 lb (81.647 kg)    Dentist: He will see his dentist in April.  Immunizations:  Immunization History  Administered Date(s) Administered  . Influenza,inj,Quad PF,36+ Mos 06/25/2015  . Pneumococcal Conjugate-13 06/25/2015  . Tdap 04/02/2015     Patient Active Problem List   Diagnosis Date Noted  . Generalized weakness 04/11/2015  . Acute purulent bronchitis 04/11/2015  . Dehydration 04/11/2015  . Nocturia more than twice per night 01/16/2015  . Snoring 01/16/2015  . 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 Obese abdomen 01/16/2015  . Other fatigue 01/16/2015  . Type 2 diabetes mellitus without complication ([place]) 37/90/2409  . Insomnia    Past Medical History  Diagnosis Date  . Insomnia   .  Diabetes mellitus without complication ([place])   . High cholesterol    No past surgical history on file. No Known Allergies Prior to Admission medications   Medication Sig Start Date End Date Taking? Authorizing Provider  acetaminophen (TYLENOL) 325 MG tablet Take 2 tablets (650 mg total) by mouth every 6 (six) hours as needed for mild pain, moderate pain, fever or headache (or Fever >/= 101). 04/13/15   Modena Jansky, MD  aspirin EC 81 MG tablet Take 81 mg by mouth daily.    Historical Provider, MD  atorvastatin (LIPITOR) 10 MG tablet Take 1 tablet (10 mg total) by mouth daily. 12/21/14   [name], MD  azithromycin (ZITHROMAX) 250 MG tablet Take 2 pills by mouth on day 1, then 1 pill by mouth per day on days 2 through 5. 09/16/15   [name], MD  benzonatate (TESSALON) 100 MG capsule Take 1 capsule (100 mg total) by mouth every 8 (eight) hours. 09/13/15   April Palumbo, MD  blood glucose meter kit and supplies Dispense based on patient and insurance preference. Use up to four times daily as directed. (FOR ICD-9 250.00, 250.01). 12/11/14   [name], MD  chlorpheniramine (CHLOR-TRIMETON) 4 MG tablet Take 4 mg by mouth 2 (two) times daily as needed for allergies.    Historical Provider, MD  fluticasone (FLONASE) 50 MCG/ACT nasal spray Place 2 sprays into both nostrils daily. 09/13/15   April Palumbo, MD  HYDROcodone-homatropine [facility]) 5-1.5 MG/5ML syrup  73mby mouth a bedtime as needed for cough. 09/16/15   JWendie Agreste MD  menthol-cetylpyridinium (CEPACOL) 3 MG lozenge Take 1 lozenge by mouth as needed for sore throat.    Historical Provider, MD  metFORMIN (GLUCOPHAGE) 500 MG tablet TAKE 1 TABLET BY MOUTH TWICE A DAY 09/11/15   JWendie Agreste MD  Multiple Vitamin (MULTIVITAMIN WITH MINERALS) TABS tablet Take 1 tablet by mouth daily.    Historical Provider, MD  naproxen (NAPROSYN) 500 MG tablet Take 1 tablet (500 mg total) by mouth 2 (two) times daily. 09/13/15   April Palumbo, MD   ONE TOUCH ULTRA TEST test strip USE AS DIRECTED TO TEST UP TO 4 TIMES A DAY 05/14/15   JWendie Agreste MD  Suvorexant (BELSOMRA) 10 MG TABS Take 10 mg by mouth at bedtime as needed. Patient not taking: Reported on 09/12/2015 08/16/15   JWendie Agreste MD  traZODone (DESYREL) 50 MG tablet Take 2 tablets (100 mg total) by mouth at bedtime. Patient not taking: Reported on 06/25/2015 04/24/15   JWendie Agreste MD   Social History   Social History  . Marital Status: Married    Spouse Name: N/A  . Number of Children: N/A  . Years of Education: N/A   Occupational History  . Not on file.   Social History Main Topics  . Smoking status: Never Smoker   . Smokeless tobacco: Not on file  . Alcohol Use: No  . Drug Use: No  . Sexual Activity: Not on file   Other Topics Concern  . Not on file   Social History Narrative     Review of Systems  Constitutional: Negative for fever.  Respiratory: Negative for cough, shortness of breath and wheezing.   Cardiovascular: Negative for chest pain.  Gastrointestinal: Negative for blood in stool.  Neurological: Negative for dizziness and light-headedness.       Objective:   Physical Exam  Constitutional: He is oriented to person, place, and time. He appears well-developed and well-nourished. No distress.  HENT:  Head: Normocephalic and atraumatic.  Mouth/Throat: Oropharynx is clear and moist. No oropharyngeal exudate.  Eyes: Pupils are equal, round, and reactive to light.  Neck: Neck supple.  Cardiovascular: Normal rate.   Pulmonary/Chest: Effort normal.  Musculoskeletal: He exhibits no edema.  Neurological: He is alert and oriented to person, place, and time. No cranial nerve deficit.  Skin: Skin is warm and dry. No rash noted.  Psychiatric: He has a normal mood and affect. His behavior is normal.  Nursing note and vitals reviewed.   Filed Vitals:   10/03/15 0822  BP: 119/69  Pulse: 67  Temp: 97.6 F (36.4 C)  TempSrc: Oral    Resp: 16  Height: 5' 5.5" (1.664 m)  Weight: 181 lb (82.101 kg)  SpO2: 97%    Results for orders placed or performed in visit on 10/03/15  POCT glucose (manual entry)  Result Value Ref Range   POC Glucose 182 (A) 70 - 99 mg/dl  POCT glycosylated hemoglobin (Hb A1C)  Result Value Ref Range   Hemoglobin A1C 7.7         Assessment & Plan:   William ZAGALis a 70y.o. male Type 2 diabetes mellitus without complication, without long-term current use of insulin ([place] - Plan: POCT glucose (manual entry), POCT glycosylated hemoglobin (Hb A1C)  - uncontrolled. Will increase to '1000mg'$  Qam, remain at '500mg'$  Qpm.   -cont asa qd and lipitor. Fasting labs at follow  up in 3 months.   Need for prophylactic vaccination against Streptococcus pneumoniae (pneumococcus) - Plan: Pneumococcal polysaccharide vaccine 23-valent greater than or equal to 2yo subcutaneous/IM   - pneumovax given.   Meds ordered this encounter  Medications  . metFORMIN (GLUCOPHAGE) 500 MG tablet    Sig: Take 2 tablets by mouth in the morning, 1 tablet by mouth at night.    Dispense:  270 tablet    Refill:  1   Patient Instructions  Diabetes not quite at control - still elevated as seen at last visit. Increase metformin to 2 pills in the morning, 1 at night. Recheck in 3 months. Fasting at that visit for cholesterol testing.     I personally performed the services described in this documentation, which was scribed in my presence. The recorded information has been reviewed and considered, and addended by me as needed.

## 2015-11-22 ENCOUNTER — Encounter: Payer: Self-pay | Admitting: Family Medicine

## 2015-12-18 ENCOUNTER — Other Ambulatory Visit: Payer: Self-pay | Admitting: Family Medicine

## 2016-01-09 ENCOUNTER — Ambulatory Visit: Payer: Medicare Other | Admitting: Family Medicine

## 2016-01-10 ENCOUNTER — Encounter: Payer: Self-pay | Admitting: Family Medicine

## 2016-01-10 ENCOUNTER — Ambulatory Visit (INDEPENDENT_AMBULATORY_CARE_PROVIDER_SITE_OTHER): Payer: Medicare Other | Admitting: Family Medicine

## 2016-01-10 VITALS — BP 118/60 | HR 68 | Temp 98.2°F | Resp 16 | Ht 66.0 in | Wt 186.4 lb

## 2016-01-10 DIAGNOSIS — E785 Hyperlipidemia, unspecified: Secondary | ICD-10-CM | POA: Diagnosis not present

## 2016-01-10 DIAGNOSIS — G4700 Insomnia, unspecified: Secondary | ICD-10-CM | POA: Diagnosis not present

## 2016-01-10 DIAGNOSIS — E1165 Type 2 diabetes mellitus with hyperglycemia: Secondary | ICD-10-CM

## 2016-01-10 DIAGNOSIS — IMO0001 Reserved for inherently not codable concepts without codable children: Secondary | ICD-10-CM

## 2016-01-10 DIAGNOSIS — Z23 Encounter for immunization: Secondary | ICD-10-CM

## 2016-01-10 LAB — GLUCOSE, POCT (MANUAL RESULT ENTRY): POC Glucose: 144 mg/dl — AB (ref 70–99)

## 2016-01-10 LAB — POCT GLYCOSYLATED HEMOGLOBIN (HGB A1C): Hemoglobin A1C: 7.8

## 2016-01-10 MED ORDER — ZOSTER VACCINE LIVE 19400 UNT/0.65ML ~~LOC~~ SUSR: 0.6500 mL | Freq: Once | SUBCUTANEOUS | Status: DC

## 2016-01-10 MED ORDER — METFORMIN HCL 1000 MG PO TABS: 1000.0000 mg | ORAL_TABLET | Freq: Two times a day (BID) | ORAL | Status: DC

## 2016-01-10 NOTE — Progress Notes (Addendum)
Subjective:  By signing my name below, I, [name], attest that this documentation has been prepared under the direction and in the presence of [name], MD. Electronically Signed: [name], [place]. 01/10/2016 , 1:45 PM .  Patient was seen in Room 25 .   Patient ID: William Oneill, male    DOB: 09-18-1945, 70 y.o.   MRN: [id number] Chief Complaint  Patient presents with  . Follow-up    3 MONTH DIABETES per no refills   HPI William Oneill is a 70 y.o. male Here for follow up for diabetes.   Type 2 Diabetes Lab Results  Component Value Date   HGBA1C 7.7 10/03/2015   Lab Results  Component Value Date   MICROALBUR 0.7 12/11/2014   Last visit was March 1st. We increased his metformin '1000mg'$  in the morning and '500mg'$  at night.   He denies any side effects with this dose. He checks his sugar at home, usually ranging from 126~134, but does note some days are higher than others.   Wt Readings from Last 3 Encounters:  01/10/16 186 lb 6.4 oz (84.55 kg)  10/03/15 181 lb (82.101 kg)  09/16/15 184 lb (83.462 kg)   Exercise  He notes walking 2-3 times a week.   Eye doctor He was scheduled to go today but had to reschedule due to this appointment rescheduling. His next appointment is July 12th. He denies being informed of retinopathy previously.   HLD Lab Results  Component Value Date   CHOL 119* 03/19/2015   HDL 41 03/19/2015   LDLCALC 61 03/19/2015   TRIG 85 03/19/2015   CHOLHDL 2.9 03/19/2015   Lab Results  Component Value Date   ALT 37 04/17/2015   AST 22 04/17/2015   ALKPHOS 102 04/17/2015   BILITOT 1.0 04/17/2015   He takes lipitor '10mg'$  qd and also aspirin '81mg'$  qd. He denies any side effects with his medication.   Insomnia He takes belsomra without difficulty. He takes one dose once every 3-4 nights. He denies parasomnia or side effects.   Health maintenance Colonoscopy has been recommended but he declined. He denies family history of colon cancer.   Shingles vaccine discussed. He'll think about it.   Patient Active Problem List   Diagnosis Date Noted  . Generalized weakness 04/11/2015  . Acute purulent bronchitis 04/11/2015  . Dehydration 04/11/2015  . Nocturia more than twice per night 01/16/2015  . Snoring 01/16/2015  . 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 Obese abdomen 01/16/2015  . Other fatigue 01/16/2015  . Type 2 diabetes mellitus without complication ([place]) 44/10/4740  . Insomnia    Past Medical History  Diagnosis Date  . Insomnia   . Diabetes mellitus without complication ([place])   . High cholesterol    No past surgical history on file. No Known Allergies Prior to Admission medications   Medication Sig Start Date End Date Taking? Authorizing Provider  acetaminophen (TYLENOL) 325 MG tablet Take 2 tablets (650 mg total) by mouth every 6 (six) hours as needed for mild pain, moderate pain, fever or headache (or Fever >/= 101). 04/13/15   Modena Jansky, MD  aspirin EC 81 MG tablet Take 81 mg by mouth daily.    Historical Provider, MD  atorvastatin (LIPITOR) 10 MG tablet TAKE 1 TABLET BY MOUTH DAILY 12/18/15   [name], MD  blood glucose meter kit and supplies Dispense based on patient and insurance preference. Use up to four times daily as directed. (  FOR ICD-9 250.00, 250.01). 12/11/14   [name], MD  chlorpheniramine (CHLOR-TRIMETON) 4 MG tablet Take 4 mg by mouth 2 (two) times daily as needed for allergies. Reported on 10/03/2015    Historical Provider, MD  menthol-cetylpyridinium (CEPACOL) 3 MG lozenge Take 1 lozenge by mouth as needed for sore throat. Reported on 10/03/2015    Historical Provider, MD  metFORMIN (GLUCOPHAGE) 500 MG tablet Take 2 tablets by mouth in the morning, 1 tablet by mouth at night. 10/03/15   [name], MD  Multiple Vitamin (MULTIVITAMIN WITH MINERALS) TABS tablet Take 1 tablet by mouth daily.    Historical Provider, MD  ONE TOUCH ULTRA TEST test strip USE AS  DIRECTED TO TEST UP TO 4 TIMES A DAY 05/14/15   [name], MD  Suvorexant (BELSOMRA) 10 MG TABS Take 10 mg by mouth at bedtime as needed. 08/16/15   [name], MD   Social History   Social History  . Marital Status: Married    Spouse Name: N/A  . Number of Children: N/A  . Years of Education: N/A   Occupational History  . Not on file.   Social History Main Topics  . Smoking status: Never Smoker   . Smokeless tobacco: Not on file  . Alcohol Use: No  . Drug Use: No  . Sexual Activity: Not on file   Other Topics Concern  . Not on file   Social History Narrative   Review of Systems  Constitutional: Negative for fatigue and unexpected weight change.  Eyes: Negative for visual disturbance.  Respiratory: Negative for cough, chest tightness and shortness of breath.   Cardiovascular: Negative for chest pain, palpitations and leg swelling.  Gastrointestinal: Negative for abdominal pain and blood in stool.  Neurological: Negative for dizziness, light-headedness and headaches.       Objective:   Physical Exam  Constitutional: He is oriented to person, place, and time. He appears well-developed and well-nourished.  HENT:  Head: Normocephalic and atraumatic.  Eyes: EOM are normal. Pupils are equal, round, and reactive to light.  Neck: No JVD present. Carotid bruit is not present.  Cardiovascular: Normal rate, regular rhythm and normal heart sounds.   No murmur heard. Pulmonary/Chest: Effort normal and breath sounds normal. He has no rales.  Musculoskeletal: He exhibits no edema.  Neurological: He is alert and oriented to person, place, and time.  Skin: Skin is warm and dry.  Psychiatric: He has a normal mood and affect.  Vitals reviewed.   Filed Vitals:   01/10/16 1321 01/10/16 1324  BP: 142/62 118/60  Pulse: 68   Temp: 98.2 F (36.8 C)   TempSrc: Oral   Resp: 16   Height: '5\' 6"'$  (1.676 m)   Weight: 186 lb 6.4 oz (84.55 kg)   SpO2: 98%   .this       Assessment & Plan:   William Oneill is a 70 y.o. male Uncontrolled type 2 diabetes mellitus without complication, without long-term current use of insulin ([place]) - Plan: POCT glucose (manual entry), POCT glycosylated hemoglobin (Hb A1C), Microalbumin, urine, metFORMIN (GLUCOPHAGE) 1000 MG tablet  -A1c 7.8. Will increase metformin to 1000 units twice a day, work on diet and activity/exercise. Check urine microalbumin. Recheck 3 months.  Insomnia  -Continue Balsomra. Stable. No new side effects with medication, and is tolerating dose.  Hyperlipidemia - Plan: COMPLETE METABOLIC PANEL WITH GFR, Lipid panel  Tolerating Lipitor without new side effects. Check lipids, CMP, continue same dose.  Need for shingles vaccine - Plan: Zoster Vaccine Live, PF, (ZOSTAVAX) 17981 UNT/0.65ML injection  -Prescription given, can have filled pharmacy if he is okay with cost.  Health maintenance. Colonoscopy/colon cancer screening options discussed. He would like to defer these at this time. Advised to let me know as soon as he is ready, and look into Cologuard or colonoscopy referral.  Meds ordered this encounter  Medications  . Zoster Vaccine Live, PF, (ZOSTAVAX) 02548 UNT/0.65ML injection    Sig: Inject 19,400 Units into the skin once.    Dispense:  1 each    Refill:  0  . metFORMIN (GLUCOPHAGE) 1000 MG tablet    Sig: Take 1 tablet (1,000 mg total) by mouth 2 (two) times daily with a meal.    Dispense:  180 tablet    Refill:  1   Patient Instructions    IF you received an x-ray today, you will receive an invoice from [facility]. Please contact [facility] at [phone] with questions or concerns regarding your invoice.   IF you received labwork today, you will receive an invoice from [employer]. Please contact [facility] at [phone] with questions or concerns regarding your invoice.   Our billing staff will not be able to assist you with questions  regarding bills from these companies.  You will be contacted with the lab results as soon as they are available. The fastest way to get your results is to activate your My Chart account. Instructions are located on the last page of this paperwork. If you have not heard from [place] regarding the results in 2 weeks, please contact this office.    When you are ready, I will be happy to refer you for colonoscopy or order Cologuard to screen for colon cancer.   Increase activity/exercise to work on weight as well as to help diabetes. Increase metformin to '100mg'$  twice per day.   I printed the shingles vaccine for you to take to your pharmacy. You can check into coverage/cost with your insurance, then have it given by your pharmacy when you are ready.   Recheck in 3 months.   Return to the clinic or go to the nearest emergency room if any of your symptoms worsen or new symptoms occur.     I personally performed the services described in this documentation, which was scribed in my presence. The recorded information has been reviewed and considered, and addended by me as needed.   Signed,   [name], MD [facility].  01/12/2016 10:08 PM

## 2016-01-10 NOTE — Patient Instructions (Addendum)
  IF you received an x-ray today, you will receive an invoice from [facility]. Please contact [facility] at [phone] with questions or concerns regarding your invoice.   IF you received labwork today, you will receive an invoice from [employer]. Please contact [facility] at [phone] with questions or concerns regarding your invoice.   Our billing staff will not be able to assist you with questions regarding bills from these companies.  You will be contacted with the lab results as soon as they are available. The fastest way to get your results is to activate your My Chart account. Instructions are located on the last page of this paperwork. If you have not heard from [place] regarding the results in 2 weeks, please contact this office.    When you are ready, I will be happy to refer you for colonoscopy or order Cologuard to screen for colon cancer.   Increase activity/exercise to work on weight as well as to help diabetes. Increase metformin to 100mg  twice per day.   I printed the shingles vaccine for you to take to your pharmacy. You can check into coverage/cost with your insurance, then have it given by your pharmacy when you are ready.   Recheck in 3 months.   Return to the clinic or go to the nearest emergency room if any of your symptoms worsen or new symptoms occur.

## 2016-01-11 LAB — MICROALBUMIN, URINE: Microalb, Ur: 0.9 mg/dL

## 2016-01-11 LAB — COMPLETE METABOLIC PANEL WITH GFR
ALBUMIN: 4.1 g/dL (ref 3.6–5.1)
ALK PHOS: 57 U/L (ref 40–115)
ALT: 20 U/L (ref 9–46)
AST: 17 U/L (ref 10–35)
BUN: 16 mg/dL (ref 7–25)
CALCIUM: 8.8 mg/dL (ref 8.6–10.3)
CHLORIDE: 103 mmol/L (ref 98–110)
CO2: 23 mmol/L (ref 20–31)
Creat: 0.73 mg/dL (ref 0.70–1.25)
GFR, Est African American: >89 mL/min (ref >=60)
Glucose, Bld: 126 mg/dL — ABNORMAL HIGH (ref 65–99)
POTASSIUM: 4.3 mmol/L (ref 3.5–5.3)
SODIUM: 139 mmol/L (ref 135–146)
Total Bilirubin: 1.9 mg/dL — ABNORMAL HIGH (ref 0.2–1.2)
Total Protein: 6.3 g/dL (ref 6.1–8.1)

## 2016-01-11 LAB — LIPID PANEL
CHOL/HDL RATIO: 2.8 ratio (ref <=5.0)
CHOLESTEROL: 135 mg/dL (ref 125–200)
HDL: 48 mg/dL (ref >=40)
LDL Cholesterol: 70 mg/dL (ref <130)
TRIGLYCERIDES: 87 mg/dL (ref <150)
VLDL: 17 mg/dL (ref <30)

## 2016-01-28 ENCOUNTER — Encounter: Payer: Self-pay | Admitting: *Deleted

## 2016-04-10 ENCOUNTER — Ambulatory Visit (INDEPENDENT_AMBULATORY_CARE_PROVIDER_SITE_OTHER): Payer: Medicare Other | Admitting: Family Medicine

## 2016-04-10 ENCOUNTER — Encounter: Payer: Self-pay | Admitting: Family Medicine

## 2016-04-10 VITALS — BP 136/68 | HR 68 | Temp 98.5°F | Resp 16 | Ht 65.0 in | Wt 186.0 lb

## 2016-04-10 DIAGNOSIS — Z23 Encounter for immunization: Secondary | ICD-10-CM

## 2016-04-10 DIAGNOSIS — G4700 Insomnia, unspecified: Secondary | ICD-10-CM

## 2016-04-10 DIAGNOSIS — IMO0001 Reserved for inherently not codable concepts without codable children: Secondary | ICD-10-CM

## 2016-04-10 DIAGNOSIS — E1165 Type 2 diabetes mellitus with hyperglycemia: Secondary | ICD-10-CM

## 2016-04-10 DIAGNOSIS — E119 Type 2 diabetes mellitus without complications: Secondary | ICD-10-CM

## 2016-04-10 MED ORDER — SUVOREXANT 10 MG PO TABS: 10.0000 mg | ORAL_TABLET | Freq: Every evening | ORAL | 3 refills | Status: DC | PRN

## 2016-04-10 MED ORDER — METFORMIN HCL 1000 MG PO TABS
1000.0000 mg | ORAL_TABLET | Freq: Two times a day (BID) | ORAL | 1 refills | Status: DC
Start: 2016-04-10 — End: 2016-04-10

## 2016-04-10 MED ORDER — METFORMIN HCL 1000 MG PO TABS: 1000.0000 mg | ORAL_TABLET | Freq: Two times a day (BID) | ORAL | 1 refills | Status: DC

## 2016-04-10 NOTE — Progress Notes (Signed)
By signing my name below, I, [name], attest that this documentation has been prepared under the direction and in the presence of [name].  Electronically Signed: [name], Medical Scribe. 04/10/16. 1:40 PM.  Subjective:    Patient ID: William Oneill, male    DOB: Feb 21, 1946, 70 y.o.   MRN: [id number]  HPI Chief Complaint  Patient presents with  . Follow-up    3 MONTHS - DIABETES   . Medication Refill    belsorma    HPI Comments: William Oneill is a 70 y.o. male who presents to the [facility] for 3 month DM and insomnia follow-up.  DM: We increased Metformin to 1000 mg BID. Pt is compliant with his medication and walks every day. Pt mentions he has cut back on his sweets and has been eating healtheir. Pt eats a salad for lunch, and dinner is his biggest meal. Pt denies experiencing any negative side effects while on Metformin.  Lab Results  Component Value Date   HGBA1C 7.8 01/10/2016   Lab Results  Component Value Date   MICROALBUR 0.9 01/10/2016   Wt Readings from Last 3 Encounters:  04/10/16 186 lb (84.4 kg)  01/10/16 186 lb 6.4 oz (84.6 kg)  10/03/15 181 lb (82.1 kg)   Insomnia: He has tolerated Belsomra and has been taking it about 2-3 QHS.  HLD: Was continued on Lipitor same dose. Pt denies any negative side effects while on Lipitor. Lab Results  Component Value Date   CHOL 135 01/10/2016   HDL 48 01/10/2016   LDLCALC 70 01/10/2016   TRIG 87 01/10/2016   CHOLHDL 2.8 01/10/2016   Lab Results  Component Value Date   ALT 20 01/10/2016   AST 17 01/10/2016   ALKPHOS 57 01/10/2016   BILITOT 1.9 (H) 01/10/2016    Patient Active Problem List   Diagnosis Date Noted  . Generalized weakness 04/11/2015  . Acute purulent bronchitis 04/11/2015  . Dehydration 04/11/2015  . Nocturia more than twice per night 01/16/2015  . Snoring 01/16/2015  . 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 Obese abdomen 01/16/2015    . Other fatigue 01/16/2015  . Type 2 diabetes mellitus without complication ([place]) 20/35/5974  . Insomnia    Past Medical History:  Diagnosis Date  . Diabetes mellitus without complication ([place])   . High cholesterol   . Insomnia    No past surgical history on file. No Known Allergies Prior to Admission medications   Medication Sig Start Date End Date Taking? Authorizing Provider  acetaminophen (TYLENOL) 325 MG tablet Take 2 tablets (650 mg total) by mouth every 6 (six) hours as needed for mild pain, moderate pain, fever or headache (or Fever >/= 101). 04/13/15   Modena Jansky, MD  aspirin EC 81 MG tablet Take 81 mg by mouth daily.    Historical Provider, MD  atorvastatin (LIPITOR) 10 MG tablet TAKE 1 TABLET BY MOUTH DAILY 12/18/15   [name], MD  blood glucose meter kit and supplies Dispense based on patient and insurance preference. Use up to four times daily as directed. (FOR ICD-9 250.00, 250.01). 12/11/14   [name], MD  chlorpheniramine (CHLOR-TRIMETON) 4 MG tablet Take 4 mg by mouth 2 (two) times daily as needed for allergies. Reported on 01/10/2016    Historical Provider, MD  menthol-cetylpyridinium (CEPACOL) 3 MG lozenge Take 1 lozenge by mouth as needed for sore throat. Reported on 01/10/2016    Historical Provider,  MD  metFORMIN (GLUCOPHAGE) 1000 MG tablet Take 1 tablet (1,000 mg total) by mouth 2 (two) times daily with a meal. 01/10/16   [name], MD  Multiple Vitamin (MULTIVITAMIN WITH MINERALS) TABS tablet Take 1 tablet by mouth daily.    Historical Provider, MD  ONE TOUCH ULTRA TEST test strip USE AS DIRECTED TO TEST UP TO 4 TIMES A DAY 05/14/15   [name], MD  Suvorexant (BELSOMRA) 10 MG TABS Take 10 mg by mouth at bedtime as needed. 08/16/15   [name], MD  Zoster Vaccine Live, PF, (ZOSTAVAX) 99371 UNT/0.65ML injection Inject 19,400 Units into the skin once. 01/10/16   [name], MD   Social History   Social History  . Marital status:  Married    Spouse name: N/A  . Number of children: N/A  . Years of education: N/A   Occupational History  . Not on file.   Social History Main Topics  . Smoking status: Never Smoker  . Smokeless tobacco: Not on file  . Alcohol use No  . Drug use: No  . Sexual activity: Not on file   Other Topics Concern  . Not on file   Social History Narrative  . No narrative on file   Depression screen [facility] 2/9 04/10/2016 01/10/2016 10/03/2015 08/16/2015 06/25/2015  Decreased Interest 0 0 0 0 0  Down, Depressed, Hopeless 0 0 0 0 0  PHQ - 2 Score 0 0 0 0 0  Altered sleeping - - - - -  Tired, decreased energy - - - - -  Change in appetite - - - - -  PHQ-9 Score - - - - -   Review of Systems  Constitutional: Negative for fatigue and unexpected weight change.  Eyes: Negative for visual disturbance.  Respiratory: Negative for cough, chest tightness and shortness of breath.   Cardiovascular: Negative for chest pain, palpitations and leg swelling.  Gastrointestinal: Negative for abdominal pain and blood in stool.  Neurological: Negative for dizziness, light-headedness and headaches.  Psychiatric/Behavioral: Positive for sleep disturbance (has insomnia, but controlled with medication).   Objective:  Physical Exam  Constitutional: He is oriented to person, place, and time. He appears well-developed and well-nourished.  HENT:  Head: Normocephalic and atraumatic.  Eyes: EOM are normal. Pupils are equal, round, and reactive to light.  Neck: No JVD present. Carotid bruit is not present.  Cardiovascular:  No murmur heard. Episodic ectopic beat  Pulmonary/Chest: Effort normal and breath sounds normal. He has no rales.  Musculoskeletal: He exhibits no edema.  Neurological: He is alert and oriented to person, place, and time.  Skin: Skin is warm and dry.  Psychiatric: He has a normal mood and affect.  Vitals reviewed.  BP 136/68 (BP Location: Left Arm, Patient Position: Sitting, Cuff Size: Normal)    Pulse 68   Temp 98.5 F (36.9 C) (Oral)   Resp 16   Ht '5\' 5"'$  (1.651 m)   Wt 186 lb (84.4 kg)   SpO2 96%   BMI 30.95 kg/m  Assessment & Plan:   William Oneill is a 70 y.o. male Type 2 diabetes mellitus without complication, without long-term current use of insulin ([place]) - Plan: Hemoglobin A1c Uncontrolled type 2 diabetes mellitus without complication, without long-term current use of insulin ([facility]) - Plan: metFORMIN (GLUCOPHAGE) 1000 MG tablet, DISCONTINUED: metFORMIN (GLUCOPHAGE) 1000 MG tablet  - repeat A1c. No change in meds for now, but if still uncontrolled may need to add another  agent. Diet and exercise discussed.   Insomnia - Plan: Suvorexant (BELSOMRA) 10 MG TABS  - tolerating Belsomra without new SE's.  No change for now.   Flu vaccine need - Plan: Flu Vaccine QUAD 36+ mos IM  Meds ordered this encounter  Medications  . DISCONTD: metFORMIN (GLUCOPHAGE) 1000 MG tablet    Sig: Take 1 tablet (1,000 mg total) by mouth 2 (two) times daily with a meal.    Dispense:  180 tablet    Refill:  1  . Suvorexant (BELSOMRA) 10 MG TABS    Sig: Take 10 mg by mouth at bedtime as needed.    Dispense:  30 tablet    Refill:  3  . metFORMIN (GLUCOPHAGE) 1000 MG tablet    Sig: Take 1 tablet (1,000 mg total) by mouth 2 (two) times daily with a meal.    Dispense:  180 tablet    Refill:  1   Patient Instructions    Diabetes was uncontrolled last visit. I will check the A1c again today, and if still above 7, will likely add a new medication as we discussed. Continue to work on diet, exercise, decrease portion size at dinner, and follow-up in 3 months.  I refilled your Belsomra for sleep.    IF you received an x-ray today, you will receive an invoice from [facility]. Please contact [facility] at [phone] with questions or concerns regarding your invoice.   IF you received labwork today, you will receive an invoice from [employer]. Please  contact [facility] at [phone] with questions or concerns regarding your invoice.   Our billing staff will not be able to assist you with questions regarding bills from these companies.  You will be contacted with the lab results as soon as they are available. The fastest way to get your results is to activate your My Chart account. Instructions are located on the last page of this paperwork. If you have not heard from [place] regarding the results in 2 weeks, please contact this office.       I personally performed the services described in this documentation, which was scribed in my presence. The recorded information has been reviewed and considered, and addended by me as needed.   Signed,   [name], MD [facility].  04/12/16 10:14 PM

## 2016-04-10 NOTE — Patient Instructions (Addendum)
  Diabetes was uncontrolled last visit. I will check the A1c again today, and if still above 7, will likely add a new medication as we discussed. Continue to work on diet, exercise, decrease portion size at dinner, and follow-up in 3 months.  I refilled your Belsomra for sleep.    IF you received an x-ray today, you will receive an invoice from [facility]. Please contact [facility] at [phone] with questions or concerns regarding your invoice.   IF you received labwork today, you will receive an invoice from [employer]. Please contact [facility] at [phone] with questions or concerns regarding your invoice.   Our billing staff will not be able to assist you with questions regarding bills from these companies.  You will be contacted with the lab results as soon as they are available. The fastest way to get your results is to activate your My Chart account. Instructions are located on the last page of this paperwork. If you have not heard from [place] regarding the results in 2 weeks, please contact this office.

## 2016-04-10 NOTE — Progress Notes (Signed)
FO

## 2016-04-11 LAB — HEMOGLOBIN A1C
Hgb A1c MFr Bld: 7 % — ABNORMAL HIGH (ref <5.7)
Mean Plasma Glucose: 154 mg/dL

## 2016-04-12 ENCOUNTER — Encounter: Payer: Self-pay | Admitting: Family Medicine

## 2016-07-14 ENCOUNTER — Encounter: Payer: Self-pay | Admitting: Family Medicine

## 2016-07-14 ENCOUNTER — Ambulatory Visit (INDEPENDENT_AMBULATORY_CARE_PROVIDER_SITE_OTHER): Payer: Medicare Other | Admitting: Family Medicine

## 2016-07-14 VITALS — BP 134/82 | HR 68 | Temp 98.6°F | Resp 17 | Ht 65.0 in | Wt 189.0 lb

## 2016-07-14 DIAGNOSIS — E1165 Type 2 diabetes mellitus with hyperglycemia: Secondary | ICD-10-CM | POA: Diagnosis not present

## 2016-07-14 DIAGNOSIS — E785 Hyperlipidemia, unspecified: Secondary | ICD-10-CM

## 2016-07-14 DIAGNOSIS — IMO0001 Reserved for inherently not codable concepts without codable children: Secondary | ICD-10-CM

## 2016-07-14 DIAGNOSIS — E119 Type 2 diabetes mellitus without complications: Secondary | ICD-10-CM | POA: Diagnosis not present

## 2016-07-14 DIAGNOSIS — Z13220 Encounter for screening for lipoid disorders: Secondary | ICD-10-CM | POA: Diagnosis not present

## 2016-07-14 MED ORDER — METFORMIN HCL 1000 MG PO TABS: 1000.0000 mg | ORAL_TABLET | Freq: Two times a day (BID) | ORAL | 1 refills | Status: DC

## 2016-07-14 NOTE — Patient Instructions (Addendum)
  No change in medications today, but if your hemoglobin A1c is still elevated, we will likely add a medication such as Januvia or [place]. Continue exercise and watching diet. Follow-up in 3 months.   IF you received an x-ray today, you will receive an invoice from [facility]. Please contact [facility] at [phone] with questions or concerns regarding your invoice.   IF you received labwork today, you will receive an invoice from [employer]. Please contact [facility] at [phone] with questions or concerns regarding your invoice.   Our billing staff will not be able to assist you with questions regarding bills from these companies.  You will be contacted with the lab results as soon as they are available. The fastest way to get your results is to activate your My Chart account. Instructions are located on the last page of this paperwork. If you have not heard from [place] regarding the results in 2 weeks, please contact this office.

## 2016-07-14 NOTE — Progress Notes (Signed)
Subjective:  By signing my name below, I, Raven Small, attest that this documentation has been prepared under the direction and in the presence of [name], MD.  Electronically Signed: Thea Alken, ED Scribe. 07/14/2016. 8:43 AM.   Patient ID: William Oneill, male    DOB: 03/18/1946, 70 y.o.   MRN: [id number]  HPI   Chief Complaint  Patient presents with  . Follow-up    diabetes     HPI Comments: William Oneill is a 70 y.o. male who presents to the [facility] for diabetes follow up.   Lab Results  Component Value Date   HGBA1C 7.0 (H) 04/10/2016   Lab Results  Component Value Date   MICROALBUR 0.9 01/10/2016   He was metformin 1000 mg at last visit. No changed on metformin. He had a lipid panel in June that was normal. He is on Lipitor 10 mg qd.   Pt has checks blood sugar outside of office. Readings range from 128-134. He denies symptomatic lows.   Vision He was seen by ophthalmologist this years and has scheduled an appointment for next year as well.   Dentist  He has an appointment with dentist next month.   Exercise Pt walks daily for exercise.  Wt Readings from Last 3 Encounters:  07/14/16 189 lb (85.7 kg)  04/10/16 186 lb (84.4 kg)  01/10/16 186 lb 6.4 oz (84.6 kg)   Immunization  Pt has had flu shot as well as pneumonia vaccination. He received a prescription for shingles vaccines last visit.   He denies abdominal pain. He also denies change in feet.    Pt had coffee with half and half this morning.   Patient Active Problem List   Diagnosis Date Noted  . Generalized weakness 04/11/2015  . Acute purulent bronchitis 04/11/2015  . Dehydration 04/11/2015  . Nocturia more than twice per night 01/16/2015  . Snoring 01/16/2015  . 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 Obese abdomen 01/16/2015  . Other fatigue 01/16/2015  . Type 2 diabetes mellitus without complication ([place]) 85/27/7824  . Insomnia    Past  Medical History:  Diagnosis Date  . Diabetes mellitus without complication ([place])   . High cholesterol   . Insomnia    No past surgical history on file. No Known Allergies Prior to Admission medications   Medication Sig Start Date End Date Taking? Authorizing Provider  aspirin EC 81 MG tablet Take 81 mg by mouth daily.   Yes Historical Provider, MD  atorvastatin (LIPITOR) 10 MG tablet TAKE 1 TABLET BY MOUTH DAILY 12/18/15  Yes [name], MD  blood glucose meter kit and supplies Dispense based on patient and insurance preference. Use up to four times daily as directed. (FOR ICD-9 250.00, 250.01). 12/11/14  Yes [name], MD  chlorpheniramine (CHLOR-TRIMETON) 4 MG tablet Take 4 mg by mouth 2 (two) times daily as needed for allergies. Reported on 01/10/2016   Yes Historical Provider, MD  metFORMIN (GLUCOPHAGE) 1000 MG tablet Take 1 tablet (1,000 mg total) by mouth 2 (two) times daily with a meal. 04/10/16  Yes [name], MD  Multiple Vitamin (MULTIVITAMIN WITH MINERALS) TABS tablet Take 1 tablet by mouth daily.   Yes Historical Provider, MD  ONE TOUCH ULTRA TEST test strip USE AS DIRECTED TO TEST UP TO 4 TIMES A DAY 05/14/15  Yes [name], MD  Suvorexant (BELSOMRA) 10 MG TABS Take 10 mg by mouth at bedtime as  needed. 04/10/16  Yes [name], MD  Zoster Vaccine Live, PF, (ZOSTAVAX) 67893 UNT/0.65ML injection Inject 19,400 Units into the skin once. 01/10/16  Yes [name], MD   Social History   Social History  . Marital status: Married    Spouse name: N/A  . Number of children: N/A  . Years of education: N/A   Occupational History  . Not on file.   Social History Main Topics  . Smoking status: Never Smoker  . Smokeless tobacco: Not on file  . Alcohol use No  . Drug use: No  . Sexual activity: Not on file   Other Topics Concern  . Not on file   Social History Narrative  . No narrative on file    Review of Systems  Constitutional: Negative for  chills and fever.  Eyes: Negative for visual disturbance.  Gastrointestinal: Negative for abdominal pain.  Skin: Negative for color change, rash and wound.       Objective:   Physical Exam  Constitutional: He is oriented to person, place, and time. He appears well-developed and well-nourished. No distress.  HENT:  Head: Normocephalic and atraumatic.  Eyes: Conjunctivae and EOM are normal.  Neck: Neck supple.  Cardiovascular: Normal rate.   Pulmonary/Chest: Effort normal.  Musculoskeletal: Normal range of motion.  Neurological: He is alert and oriented to person, place, and time.  Skin: Skin is warm and dry.  No significant callous or wounds on toes  Psychiatric: He has a normal mood and affect. His behavior is normal.  Nursing note and vitals reviewed.   Vitals:   07/14/16 0841  BP: 134/82  Pulse: 68  Resp: 17  Temp: 98.6 F (37 C)  TempSrc: Oral  SpO2: 96%  Weight: 189 lb (85.7 kg)  Height: '5\' 5"'$  (1.651 m)     Assessment & Plan:   William Oneill is a 70 y.o. male Type 2 diabetes mellitus without complication, without long-term current use of insulin ([place]) - Plan: Hemoglobin A1C Uncontrolled type 2 diabetes mellitus without complication, without long-term current use of insulin ([place]) - Plan: metFORMIN (GLUCOPHAGE) 1000 MG tablet  - Uncontrolled prior. A1c pending. Continue metformin thousand areas twice a day for now. Consider adding Januvia or [place] if persistent elevation.  Screening for hyperlipidemia - Plan: Comprehensive metabolic panel, Lipid panel Hyperlipidemia, unspecified hyperlipidemia type  - CMP, lipid panel pending, continue Lipitor same dose for now.  Meds ordered this encounter  Medications  . metFORMIN (GLUCOPHAGE) 1000 MG tablet    Sig: Take 1 tablet (1,000 mg total) by mouth 2 (two) times daily with a meal.    Dispense:  180 tablet    Refill:  1   Patient Instructions    No change in medications today, but if your hemoglobin A1c is still  elevated, we will likely add a medication such as Januvia or [place]. Continue exercise and watching diet. Follow-up in 3 months.   IF you received an x-ray today, you will receive an invoice from [facility]. Please contact [facility] Radiology at [phone] with questions or concerns regarding your invoice.   IF you received labwork today, you will receive an invoice from [employer]. Please contact [facility] at [phone] with questions or concerns regarding your invoice.   Our billing staff will not be able to assist you with questions regarding bills from these companies.  You will be contacted with the lab results as soon as they are available. The fastest way to get your  results is to activate your My Chart account. Instructions are located on the last page of this paperwork. If you have not heard from [place] regarding the results in 2 weeks, please contact this office.        I personally performed the services described in this documentation, which was scribed in my presence. The recorded information has been reviewed and considered, and addended by me as needed.   Signed,   [name], MD [facility].  07/14/16 9:21 AM

## 2016-07-15 LAB — COMPREHENSIVE METABOLIC PANEL
ALK PHOS: 74 IU/L (ref 39–117)
ALT: 21 IU/L (ref 0–44)
AST: 15 IU/L (ref 0–40)
Albumin/Globulin Ratio: 2 (ref 1.2–2.2)
Albumin: 4.3 g/dL (ref 3.6–4.8)
BUN/Creatinine Ratio: 27 — ABNORMAL HIGH (ref 10–24)
BUN: 22 mg/dL (ref 8–27)
Bilirubin Total: 1.4 mg/dL — ABNORMAL HIGH (ref 0.0–1.2)
CALCIUM: 9.1 mg/dL (ref 8.6–10.2)
CO2: 22 mmol/L (ref 18–29)
CREATININE: 0.82 mg/dL (ref 0.76–1.27)
Chloride: 100 mmol/L (ref 96–106)
GFR calc Af Amer: 104 mL/min/{1.73_m2} (ref >59)
GFR, EST NON AFRICAN AMERICAN: 90 mL/min/{1.73_m2} (ref >59)
GLUCOSE: 196 mg/dL — AB (ref 65–99)
Globulin, Total: 2.1 g/dL (ref 1.5–4.5)
Potassium: 4.3 mmol/L (ref 3.5–5.2)
Sodium: 139 mmol/L (ref 134–144)
Total Protein: 6.4 g/dL (ref 6.0–8.5)

## 2016-07-15 LAB — LIPID PANEL
CHOLESTEROL TOTAL: 144 mg/dL (ref 100–199)
Chol/HDL Ratio: 3.3 ratio units (ref 0.0–5.0)
HDL: 44 mg/dL (ref >39)
LDL CALC: 78 mg/dL (ref 0–99)
TRIGLYCERIDES: 110 mg/dL (ref 0–149)
VLDL CHOLESTEROL CAL: 22 mg/dL (ref 5–40)

## 2016-07-15 LAB — HEMOGLOBIN A1C
Est. average glucose Bld gHb Est-mCnc: 186 mg/dL
HEMOGLOBIN A1C: 8.1 % — AB (ref 4.8–5.6)

## 2016-07-18 ENCOUNTER — Ambulatory Visit: Payer: Medicare Other

## 2016-07-31 MED ORDER — SITAGLIPTIN PHOSPHATE 100 MG PO TABS: 100.0000 mg | ORAL_TABLET | Freq: Every day | ORAL | 1 refills | Status: DC

## 2016-07-31 NOTE — Addendum Note (Signed)
Addended by: [name] R on: 07/31/2016 10:03 AM   Modules accepted: Orders

## 2016-08-01 ENCOUNTER — Encounter: Payer: Self-pay | Admitting: *Deleted

## 2016-10-09 ENCOUNTER — Encounter: Payer: Self-pay | Admitting: Family Medicine

## 2016-10-09 LAB — HM DIABETES EYE EXAM

## 2016-10-23 ENCOUNTER — Ambulatory Visit (INDEPENDENT_AMBULATORY_CARE_PROVIDER_SITE_OTHER): Payer: Medicare Other | Admitting: Family Medicine

## 2016-10-23 VITALS — BP 140/76 | HR 59 | Temp 98.1°F | Resp 16 | Ht 65.0 in | Wt 185.0 lb

## 2016-10-23 DIAGNOSIS — E1165 Type 2 diabetes mellitus with hyperglycemia: Secondary | ICD-10-CM

## 2016-10-23 DIAGNOSIS — G4700 Insomnia, unspecified: Secondary | ICD-10-CM

## 2016-10-23 DIAGNOSIS — IMO0001 Reserved for inherently not codable concepts without codable children: Secondary | ICD-10-CM

## 2016-10-23 DIAGNOSIS — E785 Hyperlipidemia, unspecified: Secondary | ICD-10-CM

## 2016-10-23 MED ORDER — SUVOREXANT 15 MG PO TABS: 15.0000 mg | ORAL_TABLET | Freq: Every evening | ORAL | 5 refills | Status: DC | PRN

## 2016-10-23 MED ORDER — METFORMIN HCL 1000 MG PO TABS: 1000.0000 mg | ORAL_TABLET | Freq: Two times a day (BID) | ORAL | 1 refills | Status: DC

## 2016-10-23 MED ORDER — SITAGLIPTIN PHOSPHATE 100 MG PO TABS: 100.0000 mg | ORAL_TABLET | Freq: Every day | ORAL | 1 refills | Status: DC

## 2016-10-23 MED ORDER — ATORVASTATIN CALCIUM 10 MG PO TABS: 10.0000 mg | ORAL_TABLET | Freq: Every day | ORAL | 1 refills | Status: DC

## 2016-10-23 NOTE — Patient Instructions (Addendum)
I will check your A1c, but no change in meds for now.  If hemoglobin A1c is controlled today then possibly extend next visit to 6 months. However this point, I would like you to follow-up in 3 months, and we can recheck your cholesterol at that time.  Try higher dose of Belsomra to see if that helps with sleep. If that is too costly, or that is not helping within 2 weeks, let me know and we can look at other treatment options.  Return to the clinic or go to the nearest emergency room if any of your symptoms worsen or new symptoms occur.    Insomnia Insomnia is a sleep disorder that makes it difficult to fall asleep or to stay asleep. Insomnia can cause tiredness (fatigue), low energy, difficulty concentrating, mood swings, and poor performance at work or school. There are three different ways to classify insomnia:  Difficulty falling asleep.  Difficulty staying asleep.  Waking up too early in the morning. Any type of insomnia can be long-term (chronic) or short-term (acute). Both are common. Short-term insomnia usually lasts for three months or less. Chronic insomnia occurs at least three times a week for longer than three months. What are the causes? Insomnia may be caused by another condition, situation, or substance, such as:  Anxiety.  Certain medicines.  Gastroesophageal reflux disease (GERD) or other gastrointestinal conditions.  Asthma or other breathing conditions.  Restless legs syndrome, sleep apnea, or other sleep disorders.  Chronic pain.  Menopause. This may include hot flashes.  Stroke.  Abuse of alcohol, tobacco, or illegal drugs.  Depression.  Caffeine.  Neurological disorders, such as Alzheimer disease.  An overactive thyroid (hyperthyroidism). The cause of insomnia may not be known. What increases the risk? Risk factors for insomnia include:  Gender. Women are more commonly affected than men.  Age. Insomnia is more common as you get  older.  Stress. This may involve your professional or personal life.  Income. Insomnia is more common in people with lower income.  Lack of exercise.  Irregular work schedule or night shifts.  Traveling between different time zones. What are the signs or symptoms? If you have insomnia, trouble falling asleep or trouble staying asleep is the main symptom. This may lead to other symptoms, such as:  Feeling fatigued.  Feeling nervous about going to sleep.  Not feeling rested in the morning.  Having trouble concentrating.  Feeling irritable, anxious, or depressed. How is this treated? Treatment for insomnia depends on the cause. If your insomnia is caused by an underlying condition, treatment will focus on addressing the condition. Treatment may also include:  Medicines to help you sleep.  Counseling or therapy.  Lifestyle adjustments. Follow these instructions at home:  Take medicines only as directed by your health care provider.  Keep regular sleeping and waking hours. Avoid naps.  Keep a sleep diary to help you and your health care provider figure out what could be causing your insomnia. Include:  When you sleep.  When you wake up during the night.  How well you sleep.  How rested you feel the next day.  Any side effects of medicines you are taking.  What you eat and drink.  Make your bedroom a comfortable place where it is easy to fall asleep:  Put up shades or special blackout curtains to block light from outside.  Use a white noise machine to block noise.  Keep the temperature cool.  Exercise regularly as directed by your health care  provider. Avoid exercising right before bedtime.  Use relaxation techniques to manage stress. Ask your health care provider to suggest some techniques that may work well for you. These may include:  Breathing exercises.  Routines to release muscle tension.  Visualizing peaceful scenes.  Cut back on alcohol,  caffeinated beverages, and cigarettes, especially close to bedtime. These can disrupt your sleep.  Do not overeat or eat spicy foods right before bedtime. This can lead to digestive discomfort that can make it hard for you to sleep.  Limit screen use before bedtime. This includes:  Watching TV.  Using your smartphone, tablet, and computer.  Stick to a routine. This can help you fall asleep faster. Try to do a quiet activity, brush your teeth, and go to bed at the same time each night.  Get out of bed if you are still awake after 15 minutes of trying to sleep. Keep the lights down, but try reading or doing a quiet activity. When you feel sleepy, go back to bed.  Make sure that you drive carefully. Avoid driving if you feel very sleepy.  Keep all follow-up appointments as directed by your health care provider. This is important. Contact a health care provider if:  You are tired throughout the day or have trouble in your daily routine due to sleepiness.  You continue to have sleep problems or your sleep problems get worse. Get help right away if:  You have serious thoughts about hurting yourself or someone else. This information is not intended to replace advice given to you by your health care provider. Make sure you discuss any questions you have with your health care provider. Document Released: 07/18/2000 Document Revised: 12/21/2015 Document Reviewed: 04/21/2014 Elsevier Interactive Patient Education  2017 [employer].    IF you received an x-ray today, you will receive an invoice from [facility] Radiology. Please contact [facility]iology at [phone] with questions or concerns regarding your invoice.   IF you received labwork today, you will receive an invoice from [facility]. Please contact [facility] at [phone] with questions or concerns regarding your invoice.   Our billing staff will not be able to assist you with questions regarding bills from these  companies.  You will be contacted with the lab results as soon as they are available. The fastest way to get your results is to activate your My Chart account. Instructions are located on the last page of this paperwork. If you have not heard from [place] regarding the results in 2 weeks, please contact this office.

## 2016-10-23 NOTE — Progress Notes (Signed)
By signing my name below, I, [name], attest that this documentation has been prepared under the direction and in the presence of [name], MD.  Electronically Signed: [name], Medical Scribe. 10/23/16. 2:13 PM.  Subjective:    Patient ID: William Oneill, male    DOB: 1946-03-05, 71 y.o.   MRN: [id number]  HPI Chief Complaint  Patient presents with  . Follow-up    DM  check/ pt would like to also discuss medicine for sleep  . Medication Refill    Celesta Gentile    HPI Comments: William Oneill is a 71 y.o. male who presents to the Primary Care at [facility] and [facility] complaining of DM follow-up.  DM: Takes metformin 1000 mg BID, and januvia 100 mg QD. Januvia was started last visit due to elevated blood sugar. He takes ASA and is on a statin. Reports his blood sugar has been fine. Compliant with januvia and metformin without experiencing negative side effects from either medications. Pt last saw his ophthalmologist in March and has a follow-up in April to check the pressure in his eyes - no diabetic neuropathy found. Pt has been walking and exercising more as well as watching his diet. Denies symptomatic lows with blood sugar in the 60s, or highs with blood sugar in the 600s.  Lab Results  Component Value Date   HGBA1C 8.1 (H) 07/14/2016   Lab Results  Component Value Date   MICROALBUR 0.9 01/10/2016   Wt Readings from Last 3 Encounters:  10/23/16 185 lb (83.9 kg)  07/14/16 189 lb (85.7 kg)  04/10/16 186 lb (84.4 kg)   Insomnia: See prev notes. He had been well treated with belsomra. He had a sleep study previously in July 2017 without significant OSA. He had been treated with trazodone in the past prior to belsomra. Pt wakes up 3x a night and has trouble getting to sleep as well as staying asleep after waking up. He suspects he has to move his alarm clock. Pt would look at TV or get up when he can't go back to sleep to help him get back to sleep. Pt reports  belsomra worked better than trazodone, but noticed it stopped becoming effective in November 2017. Denies anxiety, depression, or eating chocolate/drinking caffeinated drinks late at night, PND, or orthopnea.  HLD: He is on lipitor 10 mg QD. Compliant with his medication without experiencing any negative side effects from it. Lab Results  Component Value Date   CHOL 144 07/14/2016   HDL 44 07/14/2016   LDLCALC 78 07/14/2016   TRIG 110 07/14/2016   CHOLHDL 3.3 07/14/2016   Lab Results  Component Value Date   ALT 21 07/14/2016   AST 15 07/14/2016   ALKPHOS 74 07/14/2016   BILITOT 1.4 (H) 07/14/2016   Patient Active Problem List   Diagnosis Date Noted  . Generalized weakness 04/11/2015  . Acute purulent bronchitis 04/11/2015  . Dehydration 04/11/2015  . Nocturia more than twice per night 01/16/2015  . Snoring 01/16/2015  . 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 Obese abdomen 01/16/2015  . Other fatigue 01/16/2015  . Type 2 diabetes mellitus without complication ([place]) 29/93/7169  . Insomnia    Past Medical History:  Diagnosis Date  . Diabetes mellitus without complication ([place])   . High cholesterol   . Insomnia    No past surgical history on file. No Known Allergies Prior to Admission medications   Medication Sig Start Date End Date Taking? Authorizing  Provider  aspirin EC 81 MG tablet Take 81 mg by mouth daily.   Yes Historical Provider, MD  atorvastatin (LIPITOR) 10 MG tablet TAKE 1 TABLET BY MOUTH DAILY 12/18/15  Yes [name], MD  blood glucose meter kit and supplies Dispense based on patient and insurance preference. Use up to four times daily as directed. (FOR ICD-9 250.00, 250.01). 12/11/14  Yes [name], MD  metFORMIN (GLUCOPHAGE) 1000 MG tablet Take 1 tablet (1,000 mg total) by mouth 2 (two) times daily with a meal. 07/14/16  Yes [name], MD  Multiple Vitamin (MULTIVITAMIN WITH MINERALS) TABS tablet Take 1 tablet by mouth daily.    Yes Historical Provider, MD  ONE TOUCH ULTRA TEST test strip USE AS DIRECTED TO TEST UP TO 4 TIMES A DAY 05/14/15  Yes [name], MD  sitaGLIPtin (JANUVIA) 100 MG tablet Take 1 tablet (100 mg total) by mouth daily. 07/31/16  Yes [name], MD  Suvorexant (BELSOMRA) 10 MG TABS Take 10 mg by mouth at bedtime as needed. 04/10/16  Yes [name], MD  Zoster Vaccine Live, PF, (ZOSTAVAX) 96222 UNT/0.65ML injection Inject 19,400 Units into the skin once. 01/10/16  Yes [name], MD   Social History   Social History  . Marital status: Married    Spouse name: N/A  . Number of children: N/A  . Years of education: N/A   Occupational History  . Not on file.   Social History Main Topics  . Smoking status: Never Smoker  . Smokeless tobacco: Not on file  . Alcohol use No  . Drug use: No  . Sexual activity: Not on file   Other Topics Concern  . Not on file   Social History Narrative  . No narrative on file   Review of Systems  Constitutional: Negative for fatigue and unexpected weight change.  Eyes: Negative for visual disturbance.  Respiratory: Negative for cough, chest tightness and shortness of breath.   Cardiovascular: Negative for chest pain, palpitations and leg swelling.  Gastrointestinal: Negative for abdominal pain and blood in stool.  Neurological: Negative for dizziness, light-headedness and headaches.   Objective:  Physical Exam  Constitutional: He is oriented to person, place, and time. He appears well-developed and well-nourished.  HENT:  Head: Normocephalic and atraumatic.  Eyes: EOM are normal. Pupils are equal, round, and reactive to light.  Neck: No JVD present. Carotid bruit is not present.  Cardiovascular: Normal rate, regular rhythm and normal heart sounds.  Exam reveals no gallop and no friction rub.   No murmur heard. Pulmonary/Chest: Effort normal and breath sounds normal. No respiratory distress. He has no wheezes. He has no rales.    Abdominal: There is no tenderness.  Musculoskeletal: He exhibits no edema.  Neurological: He is alert and oriented to person, place, and time.  Skin: Skin is warm and dry.  Psychiatric: He has a normal mood and affect.  Vitals reviewed.   Vitals:   10/23/16 1321  BP: 140/76  Pulse: (!) 59  Resp: 16  Temp: 98.1 F (36.7 C)  TempSrc: Oral  SpO2: 100%  Weight: 185 lb (83.9 kg)  Height: '5\' 5"'$  (1.651 m)  Body mass index is 30.79 kg/m. Assessment & Plan:   William Oneill is a 71 y.o. male Insomnia, unspecified type - Plan: Suvorexant (BELSOMRA) 15 MG TABS  - try higher dos eof Belosmra as tolerated prior. rtc/call if this dose does not work.   Uncontrolled type 2  diabetes mellitus without complication, without long-term current use of insulin ([place]) - Plan: HM Diabetes Foot Exam, Hemoglobin A1C, metFORMIN (GLUCOPHAGE) 1000 MG tablet, sitaGLIPtin (JANUVIA) 100 MG tablet  - check A1c, cont metformin and januvia same dose for now.   Hyperlipidemia, unspecified hyperlipidemia type - Plan: atorvastatin (LIPITOR) 10 MG tablet  - tolerating lipitor. No change for now.   Meds ordered this encounter  Medications  . Suvorexant (BELSOMRA) 15 MG TABS    Sig: Take 15 mg by mouth at bedtime as needed.    Dispense:  30 tablet    Refill:  5  . atorvastatin (LIPITOR) 10 MG tablet    Sig: Take 1 tablet (10 mg total) by mouth daily.    Dispense:  90 tablet    Refill:  1  . metFORMIN (GLUCOPHAGE) 1000 MG tablet    Sig: Take 1 tablet (1,000 mg total) by mouth 2 (two) times daily with a meal.    Dispense:  180 tablet    Refill:  1  . sitaGLIPtin (JANUVIA) 100 MG tablet    Sig: Take 1 tablet (100 mg total) by mouth daily.    Dispense:  90 tablet    Refill:  1   Patient Instructions   I will check your A1c, but no change in meds for now.  If hemoglobin A1c is controlled today then possibly extend next visit to 6 months. However this point, I would like you to follow-up in 3 months, and we can  recheck your cholesterol at that time.  Try higher dose of Belsomra to see if that helps with sleep. If that is too costly, or that is not helping within 2 weeks, let me know and we can look at other treatment options.  Return to the clinic or go to the nearest emergency room if any of your symptoms worsen or new symptoms occur.    Insomnia Insomnia is a sleep disorder that makes it difficult to fall asleep or to stay asleep. Insomnia can cause tiredness (fatigue), low energy, difficulty concentrating, mood swings, and poor performance at work or school. There are three different ways to classify insomnia:  Difficulty falling asleep.  Difficulty staying asleep.  Waking up too early in the morning. Any type of insomnia can be long-term (chronic) or short-term (acute). Both are common. Short-term insomnia usually lasts for three months or less. Chronic insomnia occurs at least three times a week for longer than three months. What are the causes? Insomnia may be caused by another condition, situation, or substance, such as:  Anxiety.  Certain medicines.  Gastroesophageal reflux disease (GERD) or other gastrointestinal conditions.  Asthma or other breathing conditions.  Restless legs syndrome, sleep apnea, or other sleep disorders.  Chronic pain.  Menopause. This may include hot flashes.  Stroke.  Abuse of alcohol, tobacco, or illegal drugs.  Depression.  Caffeine.  Neurological disorders, such as Alzheimer disease.  An overactive thyroid (hyperthyroidism). The cause of insomnia may not be known. What increases the risk? Risk factors for insomnia include:  Gender. Women are more commonly affected than men.  Age. Insomnia is more common as you get older.  Stress. This may involve your professional or personal life.  Income. Insomnia is more common in people with lower income.  Lack of exercise.  Irregular work schedule or night shifts.  Traveling between  different time zones. What are the signs or symptoms? If you have insomnia, trouble falling asleep or trouble staying asleep is the main symptom. This  may lead to other symptoms, such as:  Feeling fatigued.  Feeling nervous about going to sleep.  Not feeling rested in the morning.  Having trouble concentrating.  Feeling irritable, anxious, or depressed. How is this treated? Treatment for insomnia depends on the cause. If your insomnia is caused by an underlying condition, treatment will focus on addressing the condition. Treatment may also include:  Medicines to help you sleep.  Counseling or therapy.  Lifestyle adjustments. Follow these instructions at home:  Take medicines only as directed by your health care provider.  Keep regular sleeping and waking hours. Avoid naps.  Keep a sleep diary to help you and your health care provider figure out what could be causing your insomnia. Include:  When you sleep.  When you wake up during the night.  How well you sleep.  How rested you feel the next day.  Any side effects of medicines you are taking.  What you eat and drink.  Make your bedroom a comfortable place where it is easy to fall asleep:  Put up shades or special blackout curtains to block light from outside.  Use a white noise machine to block noise.  Keep the temperature cool.  Exercise regularly as directed by your health care provider. Avoid exercising right before bedtime.  Use relaxation techniques to manage stress. Ask your health care provider to suggest some techniques that may work well for you. These may include:  Breathing exercises.  Routines to release muscle tension.  Visualizing peaceful scenes.  Cut back on alcohol, caffeinated beverages, and cigarettes, especially close to bedtime. These can disrupt your sleep.  Do not overeat or eat spicy foods right before bedtime. This can lead to digestive discomfort that can make it hard for you to  sleep.  Limit screen use before bedtime. This includes:  Watching TV.  Using your smartphone, tablet, and computer.  Stick to a routine. This can help you fall asleep faster. Try to do a quiet activity, brush your teeth, and go to bed at the same time each night.  Get out of bed if you are still awake after 15 minutes of trying to sleep. Keep the lights down, but try reading or doing a quiet activity. When you feel sleepy, go back to bed.  Make sure that you drive carefully. Avoid driving if you feel very sleepy.  Keep all follow-up appointments as directed by your health care provider. This is important. Contact a health care provider if:  You are tired throughout the day or have trouble in your daily routine due to sleepiness.  You continue to have sleep problems or your sleep problems get worse. Get help right away if:  You have serious thoughts about hurting yourself or someone else. This information is not intended to replace advice given to you by your health care provider. Make sure you discuss any questions you have with your health care provider. Document Released: 07/18/2000 Document Revised: 12/21/2015 Document Reviewed: 04/21/2014 Elsevier Interactive Patient Education  2017 [employer].    IF you received an x-ray today, you will receive an invoice from [facility]. Please contact [facility] at [phone] with questions or concerns regarding your invoice.   IF you received labwork today, you will receive an invoice from [place]. Please contact [facility] at [phone] with questions or concerns regarding your invoice.   Our billing staff will not be able to assist you with questions regarding bills from these companies.  You will be contacted with the lab  results as soon as they are available. The fastest way to get your results is to activate your My Chart account. Instructions are located on the last page of this paperwork. If you have not  heard from [place] regarding the results in 2 weeks, please contact this office.       I personally performed the services described in this documentation, which was scribed in my presence. The recorded information has been reviewed and considered for accuracy and completeness, addended by me as needed, and agree with information above.  Signed,   [name], MD Primary Care at [place].  10/25/16 11:16 PM

## 2016-10-24 LAB — HEMOGLOBIN A1C
Est. average glucose Bld gHb Est-mCnc: 183 mg/dL
Hgb A1c MFr Bld: 8 % — ABNORMAL HIGH (ref 4.8–5.6)

## 2016-10-26 ENCOUNTER — Other Ambulatory Visit: Payer: Self-pay | Admitting: Family Medicine

## 2016-10-26 MED ORDER — GLIPIZIDE 5 MG PO TABS: 5.0000 mg | ORAL_TABLET | Freq: Every day | ORAL | 2 refills | Status: DC

## 2016-10-28 ENCOUNTER — Encounter: Payer: Self-pay | Admitting: Emergency Medicine

## 2016-12-29 ENCOUNTER — Telehealth: Payer: Self-pay | Admitting: Family Medicine

## 2016-12-29 NOTE — Telephone Encounter (Signed)
Feeling depressed all weekend. Has been under more stress lately, has felt down all weekend. Has not previously taken medications for depression or anxiety. Denies any suicidal or homicidal ideation. States it is not severe, but would like to speak with the doctor. He plans on following up with me in 2 days in the office, but if any worsening of symptoms or any suicidal or homicidal thoughts, he agreed to call 911 or go directly to the nearest emergency room.

## 2016-12-30 ENCOUNTER — Ambulatory Visit (INDEPENDENT_AMBULATORY_CARE_PROVIDER_SITE_OTHER): Payer: Medicare Other | Admitting: Family Medicine

## 2016-12-30 ENCOUNTER — Encounter: Payer: Self-pay | Admitting: Family Medicine

## 2016-12-30 VITALS — BP 135/72 | HR 60 | Temp 97.0°F | Resp 18 | Ht 65.16 in | Wt 181.0 lb

## 2016-12-30 DIAGNOSIS — F5104 Psychophysiologic insomnia: Secondary | ICD-10-CM

## 2016-12-30 DIAGNOSIS — F4323 Adjustment disorder with mixed anxiety and depressed mood: Secondary | ICD-10-CM

## 2016-12-30 DIAGNOSIS — G4700 Insomnia, unspecified: Secondary | ICD-10-CM
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
## 2017-01-29 ENCOUNTER — Encounter: Payer: Self-pay | Admitting: Family Medicine

## 2017-01-29 ENCOUNTER — Ambulatory Visit (INDEPENDENT_AMBULATORY_CARE_PROVIDER_SITE_OTHER): Payer: Medicare Other | Admitting: Family Medicine

## 2017-01-29 VITALS — BP 149/68 | HR 70 | Temp 98.0°F | Resp 18 | Ht 65.75 in | Wt 179.0 lb

## 2017-01-29 DIAGNOSIS — E785 Hyperlipidemia, unspecified: Secondary | ICD-10-CM | POA: Diagnosis not present

## 2017-01-29 DIAGNOSIS — E119 Type 2 diabetes mellitus without complications: Secondary | ICD-10-CM | POA: Diagnosis not present

## 2017-01-29 DIAGNOSIS — F4323 Adjustment disorder with mixed anxiety and depressed mood: Secondary | ICD-10-CM

## 2017-01-29 DIAGNOSIS — G4700 Insomnia, unspecified: Secondary | ICD-10-CM | POA: Diagnosis not present

## 2017-01-29 MED ORDER — GLIPIZIDE 5 MG PO TABS: 5.0000 mg | ORAL_TABLET | Freq: Every day | ORAL | 2 refills | Status: DC

## 2017-01-29 NOTE — Patient Instructions (Addendum)
Here are the numbers of the counselors again, but please let me know if you need other numbers. Continue to meet with your pastor and as we discussed I would like you to do some form of activity outside of the house every day if possible. Volunteering may also be helpful. I have included [address] [place] [address] number below to see if they have some opportunities that may interest you.   Return within the next 1 week for fasting labs (Nothing to eat after midnight). You do not need an appointment, just let them know you are here for lab work only.   No change in medications at this time, follow-up in 3 months.  Vivia Budge: [place]: [place]  Address: [address], [place], [place] [address]  Phone: [phone]   Adjustment Disorder, Adult Adjustment disorder is a group of symptoms that can develop after a stressful life event, such as the loss of a job or serious physical illness. The symptoms can affect how you feel, think, and act. They may interfere with your relationships. Adjustment disorder increases your risk of suicide and substance abuse. If this disorder is not managed early, it can develop into a more serious condition, such as major depressive disorder or post-traumatic stress disorder. What are the causes? This condition happens when you have trouble recovering from or coping with a stressful life event. What increases the risk? You are more likely to develop this condition if:  You have had depression or anxiety.  You are being treated for a long-term (chronic) illness.  You are being treated for an illness that cannot be cured (terminal illness).  You have a family history of mental illness.  What are the signs or symptoms? Symptoms of this condition include:  Extreme trouble doing daily tasks, such as going to work.  Sadness, depression, or crying spells.  Worrying a lot.  Loss of enjoyment.  Change in appetite or  weight.  Feelings of loss or hopelessness.  Thoughts of suicide.  Anxiety, worry, or nervousness.  Trouble sleeping.  Avoiding family and friends.  Fighting or vandalism.  Complaining of feeling sick without being ill.  Feeling dazed or disconnected.  Nightmares.  Trouble sleeping.  Irritability.  Reckless driving.  Poor work Systems analyst.  Ignoring bills.  Symptoms of this condition start within three months of the stressful event. They do not last more than six months, unless the stressful circumstances last longer. Normal grieving after the death of a loved one is not a symptom of this condition. How is this diagnosed? To diagnose this condition, your health care provider will ask about what has happened in your life and how it has affected you. He or she may also ask about your medical history and your use of medicines, alcohol, and other substances. Your health care provider may do a physical exam and order lab tests or other studies. You may be referred to a mental health specialist. How is this treated? Treatment options for this condition include:  Counseling or talk therapy. Talk therapy is usually provided by mental health specialists.  Medicines. Certain medicines may help with depression, anxiety, and sleep.  Support groups. These offer emotional support, advice, and guidance. They are made up of people who have had similar experiences.  Observation and time. This is sometimes called "watchful waiting." In this treatment, health care providers monitor your health and behavior without other treatment. Adjustment disorder sometimes gets better on its own with time.  Follow these instructions at home:  Take over-the-counter and prescription medicines only as told by your health care provider.  Keep all follow-up visits as told by your health care provider. This is important. Contact a health care provider if:  Your symptoms do not improve in six  months.  Your symptoms get worse. Get help right away if:  You have serious thoughts about hurting yourself or someone else. If you ever feel like you may hurt yourself or others, or have thoughts about taking your own life, get help right away. You can go to your nearest emergency department or call:  Your local emergency services (911 in the [place]  A suicide crisis helpline, such as the [facility] at 978-246-1985. This is open 24 hours a day.  Summary  Adjustment disorder is a group of symptoms that can develop after a stressful life event, such as the loss of a job or serious physical illness. The symptoms can affect how you feel, think, and act. They may interfere with your relationships.  Symptoms of this condition start within three months of the stressful event. They do not last more than six months, unless the stressful circumstances last longer.  Treatment may include talk therapy, medicines, participation in a support group, or observation to see if symptoms improve.  Contact your health care provider if your symptoms get worse or do not improve in six months.  If you ever feel like you may hurt yourself or others, or have thoughts about taking your own life, get help right away. This information is not intended to replace advice given to you by your health care provider. Make sure you discuss any questions you have with your health care provider. Document Released: 03/25/2006 Document Revised: 09/19/2016 Document Reviewed: 09/19/2016 Elsevier Interactive Patient Education  2018 [employer].   IF you received an x-ray today, you will receive an invoice from [facility]. Please contact [facility] at [phone] with questions or concerns regarding your invoice.   IF you received labwork today, you will receive an invoice from [place]. Please contact [facility] at [phone] with questions or concerns regarding your invoice.    Our billing staff will not be able to assist you with questions regarding bills from these companies.  You will be contacted with the lab results as soon as they are available. The fastest way to get your results is to activate your My Chart account. Instructions are located on the last page of this paperwork. If you have not heard from [place] regarding the results in 2 weeks, please contact this office.

## 2017-01-29 NOTE — Progress Notes (Signed)
Subjective:  By signing my name below, I, [name], attest that this documentation has been prepared under the direction and in the presence of [name], MD. Electronically Signed: [name], [place]. 01/29/2017 , 2:53 PM .  Patient was seen in Room 26 .   Patient ID: William Oneill, male    DOB: 1946/07/02, 72 y.o.   MRN: [id number] Chief Complaint  Patient presents with  . Diabetes    3 month follow-up  . Hyperlipidemia   HPI William Oneill is a 71 y.o. male Here for follow up. He ate cheese and crackers at around 12:00PM today.   Adjustment disorder See office visit on May 29th, suspected adjustment disorder with situational stressors and financial stressors. Recommended to continue pastoral counseling, exercise and restart Belsomra for insomnia. Handout also given, and phone numbers given for counseling.   Patient states he's doing a little better, but the depression still comes and goes. He still hasn't met with a therapist. He's been walking out more, about 2-3 days a week. He hasn't been doing things for fun. During the day, he would clean around his house, groceries, or go walking. He denies SI/HI. He's been taking Belsomra as needed, averaging 3 times a week.   Depression screen [facility] 2/9 01/29/2017 12/30/2016 10/23/2016 07/14/2016 04/10/2016  Decreased Interest 0 2 0 0 0  Down, Depressed, Hopeless 1 2 0 0 0  PHQ - 2 Score 1 4 0 0 0  Altered sleeping - 1 - - -  Tired, decreased energy - 1 - - -  Change in appetite - 0 - - -  Feeling bad or failure about yourself  - 1 - - -  Trouble concentrating - 1 - - -  Moving slowly or fidgety/restless - 0 - - -  Suicidal thoughts - 0 - - -  PHQ-9 Score - 8 - - -  Difficult doing work/chores - Somewhat difficult - - -      DM Lab Results  Component Value Date   HGBA1C 8.0 (H) 10/23/2016   Lab Results  Component Value Date   MICROALBUR 0.9 01/10/2016   He takes metformin 1051m BID and Januvia 1053mQD. He last  saw his ophthalmologist in March. Added glipizide 35m44mD.   He doesn't check his blood sugar everyday, but when he checks it, it's been running in range of 135-145. He denies hypoglycemia. He is scheduled to see ophthalmologist in Oct. He is scheduled to see his dentist in July.   Hyperlipidemia Lab Results  Component Value Date   CHOL 144 07/14/2016   HDL 44 07/14/2016   LDLCALC 78 07/14/2016   TRIG 110 07/14/2016   CHOLHDL 3.3 07/14/2016   Lab Results  Component Value Date   ALT 21 07/14/2016   AST 15 07/14/2016   ALKPHOS 74 07/14/2016   BILITOT 1.4 (H) 07/14/2016   He takes Lipitor 41m37m. Denies new side effects.  Patient Active Problem List   Diagnosis Date Noted  . Generalized weakness 04/11/2015  . Acute purulent bronchitis 04/11/2015  . Dehydration 04/11/2015  . Nocturia more than twice per night 01/16/2015  . Snoring 01/16/2015  . 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 Obese abdomen 01/16/2015  . Other fatigue 01/16/2015  . Type 2 diabetes mellitus without complication (HCC)[place]/060/04/[id number]    Past Medical History:  Diagnosis Date  . Diabetes mellitus without complication (HCC)[place]. High cholesterol   . Insomnia  History reviewed. No pertinent surgical history. No Known Allergies Prior to Admission medications   Medication Sig Start Date End Date Taking? Authorizing Provider  aspirin EC 81 MG tablet Take 81 mg by mouth daily.    [provider]  atorvastatin (LIPITOR) 10 MG tablet Take 1 tablet (10 mg total) by mouth daily. 10/23/16   [name], MD  blood glucose meter kit and supplies Dispense based on patient and insurance preference. Use up to four times daily as directed. (FOR ICD-9 250.00, 250.01). 12/11/14   [name], MD  glipiZIDE (GLUCOTROL) 5 MG tablet Take 1 tablet (5 mg total) by mouth daily before breakfast. 10/26/16   [name], MD  metFORMIN (GLUCOPHAGE) 1000 MG tablet Take 1 tablet (1,000  mg total) by mouth 2 (two) times daily with a meal. 10/23/16   [name], MD  Multiple Vitamin (MULTIVITAMIN WITH MINERALS) TABS tablet Take 1 tablet by mouth daily.    [provider]  ONE TOUCH ULTRA TEST test strip USE AS DIRECTED TO TEST UP TO 4 TIMES A DAY 05/14/15   [name], MD  sitaGLIPtin (JANUVIA) 100 MG tablet Take 1 tablet (100 mg total) by mouth daily. 10/23/16   [name], MD  Suvorexant (BELSOMRA) 15 MG TABS Take 15 mg by mouth at bedtime as needed. 10/23/16   [name], MD  Zoster Vaccine Live, PF, (ZOSTAVAX) 71062 UNT/0.65ML injection Inject 19,400 Units into the skin once. 01/10/16   [name], MD   Social History   Social History  . Marital status: Married    Spouse name: N/A  . Number of children: N/A  . Years of education: N/A   Occupational History  . Not on file.   Social History Main Topics  . Smoking status: Never Smoker  . Smokeless tobacco: Never Used  . Alcohol use No  . Drug use: No  . Sexual activity: Not on file   Other Topics Concern  . Not on file   Social History Narrative  . No narrative on file   Review of Systems  Constitutional: Negative for fatigue and unexpected weight change.  Eyes: Negative for visual disturbance.  Respiratory: Negative for cough, chest tightness and shortness of breath.   Cardiovascular: Negative for chest pain, palpitations and leg swelling.  Gastrointestinal: Negative for abdominal pain and blood in stool.  Neurological: Negative for dizziness, light-headedness and headaches.       Objective:   Physical Exam  Constitutional: He is oriented to person, place, and time. He appears well-developed and well-nourished.  HENT:  Head: Normocephalic and atraumatic.  Eyes: EOM are normal. Pupils are equal, round, and reactive to light.  Neck: No JVD present. Carotid bruit is not present.  Cardiovascular: Normal rate, regular rhythm and normal heart sounds.   No murmur  heard. Pulmonary/Chest: Effort normal and breath sounds normal. He has no rales.  Abdominal: Soft. Bowel sounds are normal. He exhibits no distension. There is no tenderness.  Musculoskeletal: He exhibits no edema.  Neurological: He is alert and oriented to person, place, and time.  Skin: Skin is warm and dry.  Psychiatric: He has a normal mood and affect.  Vitals reviewed.   Vitals:   01/29/17 1422  BP: (!) 149/68  Pulse: 70  Resp: 18  Temp: 98 F (36.7 C)  TempSrc: Oral  SpO2: 98%  Weight: 179 lb (81.2 kg)  Height: 5' 5.75" (1.67 m)      Assessment &  Plan:   William Oneill is a 71 y.o. male Type 2 diabetes mellitus without complication, without long-term current use of insulin ([place]) - Plan: Hemoglobin A1c  - Check A1c, continue same dose of Januvia, glipizide, metformin for now. Stressed importance of activity/exercise and management of diabetes. He will return for fasting labs.  Adjustment disorder with mixed anxiety and depressed mood Insomnia, unspecified type  -continue to meet with pastor, but other counselor numbers provided.   - discussed exercise/activity and volunteering or other activities to stay busy  - if persistent depressive symptoms, would consider SSRI, but appears to be adjustment disorder at present.   - continue Belsomra if needed for sleep.   Hyperlipidemia, unspecified hyperlipidemia type - Plan: Comprehensive metabolic panel, Lipid panel  - check labs, continue Lipitor at same dose for now.   Meds ordered this encounter  Medications  . glipiZIDE (GLUCOTROL) 5 MG tablet    Sig: Take 1 tablet (5 mg total) by mouth daily before breakfast.    Dispense:  30 tablet    Refill:  2   Patient Instructions    Here are the numbers of the counselors again, but please let me know if you need other numbers. Continue to meet with your pastor and as we discussed I would like you to do some form of activity outside of the house every day if possible.  Volunteering may also be helpful. I have included [address] number below to see if they have some opportunities that may interest you.   Return within the next 1 week for fasting labs (Nothing to eat after midnight). You do not need an appointment, just let them know you are here for lab work only.   No change in medications at this time, follow-up in 3 months.  Vivia Budge: [place]: [place]  Address: [address] [place], [place] [address]  Phone: [phone]   Adjustment Disorder, Adult Adjustment disorder is a group of symptoms that can develop after a stressful life event, such as the loss of a job or serious physical illness. The symptoms can affect how you feel, think, and act. They may interfere with your relationships. Adjustment disorder increases your risk of suicide and substance abuse. If this disorder is not managed early, it can develop into a more serious condition, such as major depressive disorder or post-traumatic stress disorder. What are the causes? This condition happens when you have trouble recovering from or coping with a stressful life event. What increases the risk? You are more likely to develop this condition if:  You have had depression or anxiety.  You are being treated for a long-term (chronic) illness.  You are being treated for an illness that cannot be cured (terminal illness).  You have a family history of mental illness.  What are the signs or symptoms? Symptoms of this condition include:  Extreme trouble doing daily tasks, such as going to work.  Sadness, depression, or crying spells.  Worrying a lot.  Loss of enjoyment.  Change in appetite or weight.  Feelings of loss or hopelessness.  Thoughts of suicide.  Anxiety, worry, or nervousness.  Trouble sleeping.  Avoiding family and friends.  Fighting or vandalism.  Complaining of feeling sick without being  ill.  Feeling dazed or disconnected.  Nightmares.  Trouble sleeping.  Irritability.  Reckless driving.  Poor work Systems analyst.  Ignoring bills.  Symptoms of this condition start within three months of the stressful event.  They do not last more than six months, unless the stressful circumstances last longer. Normal grieving after the death of a loved one is not a symptom of this condition. How is this diagnosed? To diagnose this condition, your health care provider will ask about what has happened in your life and how it has affected you. He or she may also ask about your medical history and your use of medicines, alcohol, and other substances. Your health care provider may do a physical exam and order lab tests or other studies. You may be referred to a mental health specialist. How is this treated? Treatment options for this condition include:  Counseling or talk therapy. Talk therapy is usually provided by mental health specialists.  Medicines. Certain medicines may help with depression, anxiety, and sleep.  Support groups. These offer emotional support, advice, and guidance. They are made up of people who have had similar experiences.  Observation and time. This is sometimes called "watchful waiting." In this treatment, health care providers monitor your health and behavior without other treatment. Adjustment disorder sometimes gets better on its own with time.  Follow these instructions at home:  Take over-the-counter and prescription medicines only as told by your health care provider.  Keep all follow-up visits as told by your health care provider. This is important. Contact a health care provider if:  Your symptoms do not improve in six months.  Your symptoms get worse. Get help right away if:  You have serious thoughts about hurting yourself or someone else. If you ever feel like you may hurt yourself or others, or have thoughts about taking your own life, get help  right away. You can go to your nearest emergency department or call:  Your local emergency services (911 in the [place]  A suicide crisis helpline, such as the [facility] at 213-279-9463. This is open 24 hours a day.  Summary  Adjustment disorder is a group of symptoms that can develop after a stressful life event, such as the loss of a job or serious physical illness. The symptoms can affect how you feel, think, and act. They may interfere with your relationships.  Symptoms of this condition start within three months of the stressful event. They do not last more than six months, unless the stressful circumstances last longer.  Treatment may include talk therapy, medicines, participation in a support group, or observation to see if symptoms improve.  Contact your health care provider if your symptoms get worse or do not improve in six months.  If you ever feel like you may hurt yourself or others, or have thoughts about taking your own life, get help right away. This information is not intended to replace advice given to you by your health care provider. Make sure you discuss any questions you have with your health care provider. Document Released: 03/25/2006 Document Revised: 09/19/2016 Document Reviewed: 09/19/2016 Elsevier Interactive Patient Education  2018 [employer].   IF you received an x-ray today, you will receive an invoice from [facility]. Please contact [facility] at [phone] with questions or concerns regarding your invoice.   IF you received labwork today, you will receive an invoice from [facility]. Please contact [facility] at [phone] with questions or concerns regarding your invoice.   Our billing staff will not be able to assist you with questions regarding bills from these companies.  You will be contacted with the lab results as soon as they are available. The fastest way to get your  results is to activate your My  Chart account. Instructions are located on the last page of this paperwork. If you have not heard from [place] regarding the results in 2 weeks, please contact this office.        I personally performed the services described in this documentation, which was scribed in my presence. The recorded information has been reviewed and considered for accuracy and completeness, addended by me as needed, and agree with information above.  Signed,   [name], MD Primary Care at [place].  01/31/17 5:56 PM

## 2017-03-20 ENCOUNTER — Telehealth: Payer: Self-pay

## 2017-03-20 NOTE — Telephone Encounter (Signed)
Left message for pt to schedule Medicare Annual Wellness Visit with nurse health advisor. -nr

## 2017-04-30 ENCOUNTER — Encounter: Payer: Self-pay | Admitting: Family Medicine

## 2017-04-30 ENCOUNTER — Ambulatory Visit (INDEPENDENT_AMBULATORY_CARE_PROVIDER_SITE_OTHER): Payer: Medicare Other | Admitting: Family Medicine

## 2017-04-30 VITALS — BP 116/54 | HR 60 | Temp 98.2°F | Resp 16 | Ht 65.75 in | Wt 188.6 lb

## 2017-04-30 DIAGNOSIS — G4700 Insomnia, unspecified: Secondary | ICD-10-CM

## 2017-04-30 DIAGNOSIS — E785 Hyperlipidemia, unspecified: Secondary | ICD-10-CM

## 2017-04-30 DIAGNOSIS — Z23 Encounter for immunization: Secondary | ICD-10-CM

## 2017-04-30 DIAGNOSIS — E119 Type 2 diabetes mellitus without complications: Secondary | ICD-10-CM | POA: Diagnosis not present

## 2017-04-30 DIAGNOSIS — E1165 Type 2 diabetes mellitus with hyperglycemia: Secondary | ICD-10-CM | POA: Diagnosis not present

## 2017-04-30 DIAGNOSIS — IMO0001 Reserved for inherently not codable concepts without codable children: Secondary | ICD-10-CM

## 2017-04-30 MED ORDER — ATORVASTATIN CALCIUM 10 MG PO TABS: 10.0000 mg | ORAL_TABLET | Freq: Every day | ORAL | 1 refills | Status: DC

## 2017-04-30 MED ORDER — METFORMIN HCL 1000 MG PO TABS: 1000.0000 mg | ORAL_TABLET | Freq: Two times a day (BID) | ORAL | 1 refills | Status: DC

## 2017-04-30 MED ORDER — GLIPIZIDE 5 MG PO TABS: 5.0000 mg | ORAL_TABLET | Freq: Every day | ORAL | 1 refills | Status: DC

## 2017-04-30 MED ORDER — SITAGLIPTIN PHOSPHATE 100 MG PO TABS: 100.0000 mg | ORAL_TABLET | Freq: Every day | ORAL | 1 refills | Status: DC

## 2017-04-30 MED ORDER — SUVOREXANT 15 MG PO TABS: 15.0000 mg | ORAL_TABLET | Freq: Every evening | ORAL | 5 refills | Status: DC | PRN

## 2017-04-30 NOTE — Progress Notes (Signed)
Subjective:  This chart was scribed for [name], MD by Tamsen Roers, at [place] at [facility].  This patient was seen in room 11 and the patient's care was started at 12:28 PM.   Chief Complaint  Patient presents with  . Follow-up    TIIDM     Patient ID: William Oneill, male    DOB: May 28, 1946, 71 y.o.   MRN: [id number]  HPI HPI Comments: William Oneill is a 71 y.o. male who presents to Primary Care at [facility] for a follow up on diabetes.    Diabetes: On metformin 100 mg BID, Januvia 100 mg QD, Glipizide 5 mg QD at last visit in June.  He was instructed to return for fasting labs in June as he was not fasting last visit.  It appears that he has not yet obtained these.--- Patient eats sweets once or twice a week and does not eat any fast food.  He is compliant with his medications and denies any side effects.  He has run out of Glipizide (since yesterday). Patient had coffee with some cream this morning (6:30 am).  Lab Results  Component Value Date   HGBA1C 8.0 (H) 10/23/2016    Lab Results  Component Value Date   MICROALBUR 0.9 01/10/2016  Has an eye doctor appointment on October 25th.     Hyperlipidemia: He was continued on Lipitor 10 mg QD. He has not yet returned for fasting labs from last visit. --- Patient is compliant with this medication and denies any side effects.  Lab Results  Component Value Date   CHOL 144 07/14/2016   HDL 44 07/14/2016   LDLCALC 78 07/14/2016   TRIG 110 07/14/2016   CHOLHDL 3.3 07/14/2016    Lab Results  Component Value Date   ALT 21 07/14/2016   AST 15 07/14/2016   ALKPHOS 74 07/14/2016   BILITOT 1.4 (H) 07/14/2016     Adjustment Disorder: See prior visits, situational and financial stressors, continued counseling and exercise. Restarted Velsomra for his insomnia.  He was improving at the June office visit. --- He has been doing a "whole lot better" and takes the Velsomra every other night.   Elevated blood  pressure: slightly high at last visit at 149/68.  Improved today. --- His blood pressures at home has been ranging around 140's .  Denies chest pain/SOB, lightheadedness or dizziness.    Patient Active Problem List   Diagnosis Date Noted  . Generalized weakness 04/11/2015  . Acute purulent bronchitis 04/11/2015  . Dehydration 04/11/2015  . Nocturia more than twice per night 01/16/2015  . Snoring 01/16/2015  . 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 Obese abdomen 01/16/2015  . Other fatigue 01/16/2015  . Type 2 diabetes mellitus without complication ([place]) 71/24/5809  . Insomnia    Past Medical History:  Diagnosis Date  . Diabetes mellitus without complication ([place])   . High cholesterol   . Insomnia    History reviewed. No pertinent surgical history. No Known Allergies Prior to Admission medications   Medication Sig Start Date End Date Taking? Authorizing Provider  aspirin EC 81 MG tablet Take 81 mg by mouth daily.    [provider]  atorvastatin (LIPITOR) 10 MG tablet Take 1 tablet (10 mg total) by mouth daily. 10/23/16   [name], MD  blood glucose meter kit and supplies Dispense based on patient and insurance preference. Use up to four times daily as directed. (FOR ICD-9 250.00, 250.01). 12/11/14  Shade Flood, MD  glipiZIDE (GLUCOTROL) 5 MG tablet Take 1 tablet (5 mg total) by mouth daily before breakfast. 01/29/17   Shade Flood, MD  metFORMIN (GLUCOPHAGE) 1000 MG tablet Take 1 tablet (1,000 mg total) by mouth 2 (two) times daily with a meal. 10/23/16   Shade Flood, MD  Multiple Vitamin (MULTIVITAMIN WITH MINERALS) TABS tablet Take 1 tablet by mouth daily.    [provider]  ONE TOUCH ULTRA TEST test strip USE AS DIRECTED TO TEST UP TO 4 TIMES A DAY 05/14/15   Shade Flood, MD  sitaGLIPtin (JANUVIA) 100 MG tablet Take 1 tablet (100 mg total) by mouth daily. 10/23/16   Shade Flood, MD  Suvorexant (BELSOMRA) 15 MG  TABS Take 15 mg by mouth at bedtime as needed. 10/23/16   Shade Flood, MD  Zoster Vaccine Live, PF, (ZOSTAVAX) 14784 UNT/0.65ML injection Inject 19,400 Units into the skin once. 01/10/16   Shade Flood, MD   Social History   Social History  . Marital status: Married    Spouse name: N/A  . Number of children: N/A  . Years of education: N/A   Occupational History  . Not on file.   Social History Main Topics  . Smoking status: Never Smoker  . Smokeless tobacco: Never Used  . Alcohol use No  . Drug use: No  . Sexual activity: Not on file   Other Topics Concern  . Not on file   Social History Narrative  . No narrative on file    Review of Systems  Constitutional: Negative for fatigue and unexpected weight change.  Eyes: Negative for visual disturbance.  Respiratory: Negative for cough, chest tightness and shortness of breath.   Cardiovascular: Negative for chest pain, palpitations and leg swelling.  Gastrointestinal: Negative for abdominal pain and blood in stool.  Neurological: Negative for dizziness, light-headedness and headaches.       Objective:   Physical Exam  Constitutional: He is oriented to person, place, and time. He appears well-developed and well-nourished.  HENT:  Head: Normocephalic and atraumatic.  Eyes: Pupils are equal, round, and reactive to light. EOM are normal.  Neck: No JVD present. Carotid bruit is not present.  Cardiovascular: Normal rate, regular rhythm and normal heart sounds.   No murmur heard. Pulmonary/Chest: Effort normal and breath sounds normal. He has no rales.  Musculoskeletal: He exhibits no edema.  Neurological: He is alert and oriented to person, place, and time.  Skin: Skin is warm and dry.  Psychiatric: He has a normal mood and affect.  Vitals reviewed.   Vitals:   04/30/17 1155  BP: (!) 116/54  Pulse: 60  Resp: 16  Temp: 98.2 F (36.8 C)  SpO2: 97%  Weight: 188 lb 9.6 oz (85.5 kg)  Height: 5' 5.75" (1.67 m)          Assessment & Plan:   William Oneill is a 71 y.o. male Uncontrolled type 2 diabetes mellitus without complication, without long-term current use of insulin (HCC) - Plan: metFORMIN (GLUCOPHAGE) 1000 MG tablet, sitaGLIPtin (JANUVIA) 100 MG tablet, Microalbumin, urine  - continue same doses metformin, januvia, glipizide.   - check A1c, microalbumin. Consider increased glipizide dose if elevated A1c.   Need for prophylactic vaccination and inoculation against influenza - Plan: Flu Vaccine QUAD 6+ mos PF IM (Fluarix Quad PF)  Hyperlipidemia, unspecified hyperlipidemia type - Plan: atorvastatin (LIPITOR) 10 MG tablet, Comprehensive metabolic panel, Lipid panel  - tolerating lipitor, check  labs, continue same dose for now.   Insomnia, unspecified type - Plan: Suvorexant (BELSOMRA) 15 MG TABS  - stable, continue Belsomra prn.    Meds ordered this encounter  Medications  . atorvastatin (LIPITOR) 10 MG tablet    Sig: Take 1 tablet (10 mg total) by mouth daily.    Dispense:  90 tablet    Refill:  1  . glipiZIDE (GLUCOTROL) 5 MG tablet    Sig: Take 1 tablet (5 mg total) by mouth daily before breakfast.    Dispense:  90 tablet    Refill:  1  . metFORMIN (GLUCOPHAGE) 1000 MG tablet    Sig: Take 1 tablet (1,000 mg total) by mouth 2 (two) times daily with a meal.    Dispense:  180 tablet    Refill:  1  . sitaGLIPtin (JANUVIA) 100 MG tablet    Sig: Take 1 tablet (100 mg total) by mouth daily.    Dispense:  90 tablet    Refill:  1  . Suvorexant (BELSOMRA) 15 MG TABS    Sig: Take 15 mg by mouth at bedtime as needed.    Dispense:  30 tablet    Refill:  5   Patient Instructions    No change in medications for now. Once I see your hemoglobin A1c we can decide if glipizide dosing needs to be changed. Continue cholesterol medication same dose as well as sleep medicine if needed. Recheck in 3 months, sooner if needed.  IF you received an x-ray today, you will receive an invoice from  [facility]. Please contact [facility] at [phone] with questions or concerns regarding your invoice.   IF you received labwork today, you will receive an invoice from [facility]. Please contact [facility] at [phone] with questions or concerns regarding your invoice.   Our billing staff will not be able to assist you with questions regarding bills from these companies.  You will be contacted with the lab results as soon as they are available. The fastest way to get your results is to activate your My Chart account. Instructions are located on the last page of this paperwork. If you have not heard from [place] regarding the results in 2 weeks, please contact this office.       I personally performed the services described in this documentation, which was scribed in my presence. The recorded information has been reviewed and considered for accuracy and completeness, addended by me as needed, and agree with information above.  Signed,   [name], MD Primary Care at [place].  04/30/17 9:43 PM

## 2017-04-30 NOTE — Patient Instructions (Addendum)
  No change in medications for now. Once I see your hemoglobin A1c we can decide if glipizide dosing needs to be changed. Continue cholesterol medication same dose as well as sleep medicine if needed. Recheck in 3 months, sooner if needed.  IF you received an x-ray today, you will receive an invoice from [facility]. Please contact [facility] at [phone] with questions or concerns regarding your invoice.   IF you received labwork today, you will receive an invoice from [facility]. Please contact [facility] at [phone] with questions or concerns regarding your invoice.   Our billing staff will not be able to assist you with questions regarding bills from these companies.  You will be contacted with the lab results as soon as they are available. The fastest way to get your results is to activate your My Chart account. Instructions are located on the last page of this paperwork. If you have not heard from [place] regarding the results in 2 weeks, please contact this office.

## 2017-05-01 LAB — HEMOGLOBIN A1C
Est. average glucose Bld gHb Est-mCnc: 174 mg/dL
Hgb A1c MFr Bld: 7.7 % — ABNORMAL HIGH (ref 4.8–5.6)

## 2017-05-01 LAB — MICROALBUMIN, URINE: Microalbumin, Urine: 3.7 ug/mL

## 2017-05-01 LAB — LIPID PANEL
CHOL/HDL RATIO: 2.9 ratio (ref 0.0–5.0)
Cholesterol, Total: 141 mg/dL (ref 100–199)
HDL: 49 mg/dL (ref >39)
LDL CALC: 73 mg/dL (ref 0–99)
Triglycerides: 96 mg/dL (ref 0–149)
VLDL Cholesterol Cal: 19 mg/dL (ref 5–40)

## 2017-05-01 LAB — COMPREHENSIVE METABOLIC PANEL
ALT: 13 IU/L (ref 0–44)
AST: 18 IU/L (ref 0–40)
Albumin/Globulin Ratio: 2 (ref 1.2–2.2)
Albumin: 4.5 g/dL (ref 3.5–4.8)
Alkaline Phosphatase: 70 IU/L (ref 39–117)
BUN/Creatinine Ratio: 18 (ref 10–24)
BUN: 15 mg/dL (ref 8–27)
Bilirubin Total: 1.6 mg/dL — ABNORMAL HIGH (ref 0.0–1.2)
CALCIUM: 9.2 mg/dL (ref 8.6–10.2)
CO2: 22 mmol/L (ref 20–29)
CREATININE: 0.82 mg/dL (ref 0.76–1.27)
Chloride: 101 mmol/L (ref 96–106)
GFR calc Af Amer: 104 mL/min/{1.73_m2} (ref >59)
GFR, EST NON AFRICAN AMERICAN: 90 mL/min/{1.73_m2} (ref >59)
GLOBULIN, TOTAL: 2.3 g/dL (ref 1.5–4.5)
Glucose: 87 mg/dL (ref 65–99)
Potassium: 4.5 mmol/L (ref 3.5–5.2)
Sodium: 142 mmol/L (ref 134–144)
Total Protein: 6.8 g/dL (ref 6.0–8.5)

## 2017-05-02 ENCOUNTER — Other Ambulatory Visit: Payer: Self-pay | Admitting: Family Medicine

## 2017-05-02 MED ORDER — GLIPIZIDE 5 MG PO TABS: 5.0000 mg | ORAL_TABLET | Freq: Two times a day (BID) | ORAL | 1 refills | Status: DC

## 2017-05-02 NOTE — Progress Notes (Signed)
Persistently elevated A1c, increase glipizide to 5 mg twice a day with meals.

## 2017-06-05 ENCOUNTER — Encounter: Payer: Self-pay | Admitting: Physician Assistant

## 2017-06-05 ENCOUNTER — Ambulatory Visit (INDEPENDENT_AMBULATORY_CARE_PROVIDER_SITE_OTHER): Payer: Medicare Other | Admitting: Physician Assistant

## 2017-06-05 VITALS — BP 130/64 | HR 65 | Temp 97.8°F | Resp 16 | Ht 66.0 in | Wt 189.8 lb

## 2017-06-05 DIAGNOSIS — R059 Cough, unspecified: Secondary | ICD-10-CM

## 2017-06-05 DIAGNOSIS — J069 Acute upper respiratory infection, unspecified: Secondary | ICD-10-CM | POA: Diagnosis not present

## 2017-06-05 DIAGNOSIS — R05 Cough: Secondary | ICD-10-CM

## 2017-06-05 DIAGNOSIS — R0981 Nasal congestion: Secondary | ICD-10-CM

## 2017-06-05 LAB — POC INFLUENZA A&B (BINAX/QUICKVUE)
Influenza A, POC: NEGATIVE
Influenza B, POC: NEGATIVE

## 2017-06-05 MED ORDER — MUCINEX DM MAXIMUM STRENGTH 60-1200 MG PO TB12: 1.0000 | ORAL_TABLET | Freq: Two times a day (BID) | ORAL | 1 refills | Status: DC

## 2017-06-05 MED ORDER — BENZONATATE 100 MG PO CAPS: 100.0000 mg | ORAL_CAPSULE | Freq: Three times a day (TID) | ORAL | 0 refills | Status: DC | PRN

## 2017-06-05 NOTE — Progress Notes (Signed)
William Oneill  MRN: [id number] DOB: 11/25/1945  PCP: [name], MD  Subjective:  Pt is a 71 year old DM, obesity who presents to clinic for cough, runny nose, achiness, sneezing and sore throat x 2 days. He felt sick when he left church two nights ago. He has tried nothing to feel better. He is eating soup and drinking tea with honey. Cough wakes him up at night.  Denies fever, chills, chest pain, productive cough, palpitations, n/v.   Review of Systems  Constitutional: Positive for fatigue. Negative for chills, diaphoresis and fever.  HENT: Positive for congestion, rhinorrhea, sneezing and sore throat. Negative for sinus pain, sinus pressure and trouble swallowing.   Respiratory: Positive for cough. Negative for chest tightness, shortness of breath and wheezing.   Cardiovascular: Negative for chest pain and leg swelling.  Gastrointestinal: Negative for abdominal pain, nausea and vomiting.  Neurological: Negative for dizziness, light-headedness and headaches.    Patient Active Problem List   Diagnosis Date Noted  . Generalized weakness 04/11/2015  . Acute purulent bronchitis 04/11/2015  . Dehydration 04/11/2015  . Nocturia more than twice per night 01/16/2015  . Snoring 01/16/2015  . 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 Obese abdomen 01/16/2015  . Other fatigue 01/16/2015  . Type 2 diabetes mellitus without complication ([place]) 15/12/6977  . Insomnia     Current Outpatient Prescriptions on File Prior to Visit  Medication Sig Dispense Refill  . aspirin EC 81 MG tablet Take 81 mg by mouth daily.    Marland Kitchen atorvastatin (LIPITOR) 10 MG tablet Take 1 tablet (10 mg total) by mouth daily. 90 tablet 1  . blood glucose meter kit and supplies Dispense based on patient and insurance preference. Use up to four times daily as directed. (FOR ICD-9 250.00, 250.01). 1 each 0  . glipiZIDE (GLUCOTROL) 5 MG tablet Take 1 tablet (5 mg total) by mouth 2 (two) times daily  before a meal. 180 tablet 1  . metFORMIN (GLUCOPHAGE) 1000 MG tablet Take 1 tablet (1,000 mg total) by mouth 2 (two) times daily with a meal. 180 tablet 1  . Multiple Vitamin (MULTIVITAMIN WITH MINERALS) TABS tablet Take 1 tablet by mouth daily.    . ONE TOUCH ULTRA TEST test strip USE AS DIRECTED TO TEST UP TO 4 TIMES A DAY 100 each 3  . sitaGLIPtin (JANUVIA) 100 MG tablet Take 1 tablet (100 mg total) by mouth daily. 90 tablet 1  . Suvorexant (BELSOMRA) 15 MG TABS Take 15 mg by mouth at bedtime as needed. 30 tablet 5  . Zoster Vaccine Live, PF, (ZOSTAVAX) 48016 UNT/0.65ML injection Inject 19,400 Units into the skin once. 1 each 0   No current facility-administered medications on file prior to visit.     No Known Allergies   Objective:  BP 130/64   Pulse 65   Temp 97.8 F (36.6 C) (Oral)   Resp 16   Ht _0  (1.676 m)   Wt 189 lb 12.8 oz (86.1 kg)   SpO2 98%   BMI 30.63 kg/m   Physical Exam  Constitutional: He is oriented to person, place, and time and well-developed, well-nourished, and in no distress. No distress.  HENT:  Right Ear: Tympanic membrane and ear canal normal.  Left Ear: Tympanic membrane and ear canal normal.  Mouth/Throat: Oropharynx is clear and moist and mucous membranes are normal.  Cardiovascular: Normal rate, regular rhythm and normal heart sounds.   Pulmonary/Chest: Effort normal and breath sounds normal.  He has no wheezes. He has no rhonchi. He has no rales.  Neurological: He is alert and oriented to person, place, and time. GCS score is 15.  Skin: Skin is warm and dry.  Psychiatric: Mood, memory, affect and judgment normal.  Vitals reviewed.   Results for orders placed or performed in visit on 06/05/17  POC Influenza A&B(BINAX/QUICKVUE)  Result Value Ref Range   Influenza A, POC Negative Negative   Influenza B, POC Negative Negative    Assessment and Plan :  1. Acute upper respiratory infection 2. Nasal congestion - POC Influenza  A&B(BINAX/QUICKVUE) - Dextromethorphan-Guaifenesin (MUCINEX DM MAXIMUM STRENGTH) 60-1200 MG TB12; Take 1 tablet by mouth every 12 (twelve) hours.  Dispense: 14 each; Refill: 1 - Supportive care discussed with pt. RTC in 5-7 days if no improvement. He agrees with plan.  3. Cough - benzonatate (TESSALON) 100 MG capsule; Take 1-2 capsules (100-200 mg total) by mouth 3 (three) times daily as needed for cough.  Dispense: 40 capsule; Refill: 0   Mercer Pod, PA-C  [facility] [place] 06/05/2017 10:09 AM

## 2017-06-05 NOTE — Patient Instructions (Addendum)
Stay well hydrated. Continue to eat plenty of soup. Get lost of rest!!  Take Mucinex twice daily for 5-7 days.  Tessalon pearls (benzonatate) is for cough. Take this as needed for cough.  See below for home care tips.  Come back in 5-7 days if you are not better.    Throat lozenges (if you are not at risk for choking) or sprays may be used to soothe your throat. Drink enough water and fluids to keep your urine clear or pale yellow. For sore throat: ? Gargle with 8 oz of salt water ( tsp of salt per 1 qt of water) as often as every 1-2 hours to soothe your throat.  Gargle liquid benadryl.  Use Elderberry syrup.   For sore throat try using a honey-based tea. Use 3 teaspoons of honey with juice squeezed from half lemon. Place shaved pieces of ginger into 1/2-1 cup of water and warm over stove top. Then mix the ingredients and repeat every 4 hours as needed.  Cough Syrup Recipe: Sweet Lemon & Honey Thyme  Ingredients a handful of fresh thyme sprigs   1 pint of water (2 cups)  1/2 cup honey (raw is best, but regular will do)  1/2 lemon chopped Instructions 1. Place the lemon in the pint jar and cover with the honey. The honey will macerate the lemons and draw out liquids which taste so delicious! 2. Meanwhile, toss the thyme leaves into a saucepan and cover them with the water. 3. Bring the water to a gentle simmer and reduce it to half, about a cup of tea. 4. When the tea is reduced and cooled a bit, strain the sprigs & leaves, add it into the pint jar and stir it well. 5. Give it a shake and use a spoonful as needed. 6. Store your homemade cough syrup in the refrigerator for about a month.  What causes a cough? In adults, common causes of a cough include: ?An infection of the airways or lungs (such as the common cold) ?Postnasal drip - Postnasal drip is when mucus from the nose drips down or flows along the back of the throat. Postnasal drip can happen when people have: .A  cold .Allergies .A sinus infection - The sinuses are hollow areas in the bones of the face that open into the nose. ?Lung conditions, like asthma and chronic obstructive pulmonary disease (COPD) - Both of these conditions can make it hard to breathe. COPD is usually caused by smoking. ?Acid reflux - Acid reflux is when the acid that is normally in your stomach backs up into your esophagus (the tube that carries food from your mouth to your stomach). ?A side effect from blood pressure medicines called "ACE inhibitors" ?Smoking cigarettes  Is there anything I can do on my own to get rid of my cough? Yes. To help get rid of your cough, you can: ?Use a humidifier in your bedroom ?Use an over-the-counter cough medicine, or suck on cough drops or hard candy ?Stop smoking, if you smoke ?If you have allergies, avoid the things you are allergic to (like pollen, dust, animals, or mold) If you have acid reflux, your doctor or nurse will tell you which lifestyle changes can help reduce symptoms.  Thank you for coming in today. I hope you feel we met your needs.  Feel free to call PCP if you have any questions or further requests.  Please consider signing up for MyChart if you do not already have it, as this  is a great way to communicate with me.  Best,  Whitney McVey, PA-C    IF you received an x-ray today, you will receive an invoice from [facility]. Please contact [facility] at [phone] with questions or concerns regarding your invoice.   IF you received labwork today, you will receive an invoice from [place]. Please contact [facility] at [phone] with questions or concerns regarding your invoice.   Our billing staff will not be able to assist you with questions regarding bills from these companies.  You will be contacted with the lab results as soon as they are available. The fastest way to get your results is to activate your My Chart account. Instructions are  located on the last page of this paperwork. If you have not heard from [place] regarding the results in 2 weeks, please contact this office.

## 2017-08-07 LAB — HM DIABETES EYE EXAM

## 2017-08-10 ENCOUNTER — Ambulatory Visit: Payer: Medicare Other | Admitting: Family Medicine

## 2017-08-13 ENCOUNTER — Encounter: Payer: Self-pay | Admitting: Family Medicine

## 2017-08-13 ENCOUNTER — Ambulatory Visit (INDEPENDENT_AMBULATORY_CARE_PROVIDER_SITE_OTHER): Payer: Medicare Other | Admitting: Family Medicine

## 2017-08-13 ENCOUNTER — Other Ambulatory Visit: Payer: Self-pay

## 2017-08-13 VITALS — BP 132/72 | HR 96 | Temp 98.0°F | Resp 16 | Ht 65.75 in | Wt 181.0 lb

## 2017-08-13 DIAGNOSIS — Z1159 Encounter for screening for other viral diseases: Secondary | ICD-10-CM

## 2017-08-13 DIAGNOSIS — IMO0001 Reserved for inherently not codable concepts without codable children: Secondary | ICD-10-CM

## 2017-08-13 DIAGNOSIS — E119 Type 2 diabetes mellitus without complications: Secondary | ICD-10-CM

## 2017-08-13 DIAGNOSIS — E1165 Type 2 diabetes mellitus with hyperglycemia: Secondary | ICD-10-CM | POA: Diagnosis not present

## 2017-08-13 MED ORDER — METFORMIN HCL 1000 MG PO TABS: 1000.0000 mg | ORAL_TABLET | Freq: Two times a day (BID) | ORAL | 1 refills | Status: DC

## 2017-08-13 MED ORDER — GLIPIZIDE 5 MG PO TABS: 5.0000 mg | ORAL_TABLET | Freq: Two times a day (BID) | ORAL | 1 refills | Status: DC

## 2017-08-13 MED ORDER — SITAGLIPTIN PHOSPHATE 100 MG PO TABS: 100.0000 mg | ORAL_TABLET | Freq: Every day | ORAL | 1 refills | Status: DC

## 2017-08-13 NOTE — Progress Notes (Signed)
Subjective:  By signing my name below, I, [name], attest that this documentation has been prepared under the direction and in the presence of [name], MD. Electronically Signed: [name], Chance. 08/13/2017 , 11:43 AM .  Patient was seen in Room 12 .   Patient ID: William Oneill, male    DOB: [birth year], 72 y.o.   MRN: [id number] Chief Complaint  Patient presents with  . Diabetes    3 month follow-up    HPI William Oneill is a 72 y.o. male Here for follow up of diabetes.   Diabetes Lab Results  Component Value Date   HGBA1C 7.7 (H) 04/30/2017   Lab Results  Component Value Date   MICROALBUR 0.9 01/10/2016   At last visit, he was taking glipizide '5mg'$  QD, metformin '1000mg'$  BID and Januvia '100mg'$  QD. Recommended increasing glipizide to BID with breakfast and dinner, with other medications were left the same. His urine microalbumin was normal Sept 2018. Diabetic foot exam last visit, and optho seen on Jan 4th.   Patient states he's doing well. He checks his sugars occasionally, running in 128-138. He denies any symptomatic lows. He informs walking 2-3 days a week for exercise.   Lipid screening Lab Results  Component Value Date   CHOL 141 04/30/2017   HDL 49 04/30/2017   LDLCALC 73 04/30/2017   TRIG 96 04/30/2017   CHOLHDL 2.9 04/30/2017   He is on Lipitor '10mg'$  QD.   Health maintenance He is due for Hep C screening.   Patient Active Problem List   Diagnosis Date Noted  . Generalized weakness 04/11/2015  . Acute purulent bronchitis 04/11/2015  . Dehydration 04/11/2015  . Nocturia more than twice per night 01/16/2015  . Snoring 01/16/2015  . 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 Obese abdomen 01/16/2015  . Other fatigue 01/16/2015  . Type 2 diabetes mellitus without complication ([place]) 29/56/2130  . Insomnia    Past Medical History:  Diagnosis Date  . Diabetes mellitus without complication ([place])   . High cholesterol   .  Insomnia    History reviewed. No pertinent surgical history. No Known Allergies Prior to Admission medications   Medication Sig Start Date End Date Taking? Authorizing Provider  aspirin EC 81 MG tablet Take 81 mg by mouth daily.   Yes [provider]  atorvastatin (LIPITOR) 10 MG tablet Take 1 tablet (10 mg total) by mouth daily. 04/30/17  Yes [name], MD  blood glucose meter kit and supplies Dispense based on patient and insurance preference. Use up to four times daily as directed. (FOR ICD-9 250.00, 250.01). 12/11/14  Yes [name], MD  glipiZIDE (GLUCOTROL) 5 MG tablet Take 1 tablet (5 mg total) by mouth 2 (two) times daily before a meal. 05/02/17  Yes [name], MD  metFORMIN (GLUCOPHAGE) 1000 MG tablet Take 1 tablet (1,000 mg total) by mouth 2 (two) times daily with a meal. 04/30/17  Yes [name], MD  Multiple Vitamin (MULTIVITAMIN WITH MINERALS) TABS tablet Take 1 tablet by mouth daily.   Yes [provider]  ONE TOUCH ULTRA TEST test strip USE AS DIRECTED TO TEST UP TO 4 TIMES A DAY 05/14/15  Yes [name], MD  sitaGLIPtin (JANUVIA) 100 MG tablet Take 1 tablet (100 mg total) by mouth daily. 04/30/17  Yes [name], MD  Suvorexant (BELSOMRA) 15 MG TABS Take 15 mg by mouth at bedtime as needed. 04/30/17  Yes Carlota Raspberry,  Ranell Patrick, MD  Zoster Vaccine Live, PF, (ZOSTAVAX) 46659 UNT/0.65ML injection Inject 19,400 Units into the skin once. 01/10/16  Yes [name], MD   Social History   Socioeconomic History  . Marital status: Married    Spouse name: Not on file  . Number of children: Not on file  . Years of education: Not on file  . Highest education level: Not on file  Social Needs  . Financial resource strain: Not on file  . Food insecurity - worry: Not on file  . Food insecurity - inability: Not on file  . Transportation needs - medical: Not on file  . Transportation needs - non-medical: Not on file  Occupational  History  . Not on file  Tobacco Use  . Smoking status: Never Smoker  . Smokeless tobacco: Never Used  Substance and Sexual Activity  . Alcohol use: No    Alcohol/week: 0.0 oz  . Drug use: No  . Sexual activity: Not on file  Other Topics Concern  . Not on file  Social History Narrative  . Not on file   Review of Systems  Constitutional: Negative for fatigue and unexpected weight change.  Eyes: Negative for visual disturbance.  Respiratory: Negative for cough, chest tightness and shortness of breath.   Cardiovascular: Negative for chest pain, palpitations and leg swelling.  Gastrointestinal: Negative for abdominal pain and blood in stool.  Neurological: Negative for dizziness, light-headedness and headaches.       Objective:   Physical Exam  Constitutional: He is oriented to person, place, and time. He appears well-developed and well-nourished.  HENT:  Head: Normocephalic and atraumatic.  Eyes: EOM are normal. Pupils are equal, round, and reactive to light.  Neck: No JVD present. Carotid bruit is not present.  Cardiovascular: Normal rate, regular rhythm and normal heart sounds.  No murmur heard. Pulmonary/Chest: Effort normal and breath sounds normal. He has no rales.  Musculoskeletal: He exhibits no edema.  Neurological: He is alert and oriented to person, place, and time.  Skin: Skin is warm and dry.  Psychiatric: He has a normal mood and affect.  Vitals reviewed.   Vitals:   08/13/17 1100  BP: 132/72  Pulse: 96  Resp: 16  Temp: 98 F (36.7 C)  TempSrc: Oral  SpO2: 97%  Weight: 181 lb (82.1 kg)  Height: 5' 5.75" (1.67 m)      Assessment & Plan:   William Oneill is a 72 y.o. male Uncontrolled type 2 diabetes mellitus without complication, without long-term current use of insulin ([place]) - Plan: sitaGLIPtin (JANUVIA) 100 MG tablet, metFORMIN (GLUCOPHAGE) 1000 MG tablet Type 2 diabetes mellitus without complication, without long-term current use of insulin ([facility])  - Plan: Hemoglobin A1c, glipiZIDE (GLUCOTROL) 5 MG tablet  - Check A1c, anticipate improvement with twice a day dosing of glipizide. Continue same dose of Januvia, metformin, glipizide.  -Recheck 3 months  Encounter for hepatitis C screening test for low risk patient - Plan: Hepatitis C antibody   Meds ordered this encounter  Medications  . sitaGLIPtin (JANUVIA) 100 MG tablet    Sig: Take 1 tablet (100 mg total) by mouth daily.    Dispense:  90 tablet    Refill:  1  . metFORMIN (GLUCOPHAGE) 1000 MG tablet    Sig: Take 1 tablet (1,000 mg total) by mouth 2 (two) times daily with a meal.    Dispense:  180 tablet    Refill:  1  . glipiZIDE (GLUCOTROL) 5 MG tablet  Sig: Take 1 tablet (5 mg total) by mouth 2 (two) times daily before a meal.    Dispense:  180 tablet    Refill:  1   Patient Instructions    Thanks for coming in today. I suspect your hemoglobin A1c will be improved with the higher dose of glipizide. We will let you know those results in the next 2 weeks. Continue same dose of medications for now, and follow-up in 3 months.   IF you received an x-ray today, you will receive an invoice from [facility]. Please contact [facility] at [phone] with questions or concerns regarding your invoice.   IF you received labwork today, you will receive an invoice from [place]. Please contact [facility] at [phone] with questions or concerns regarding your invoice.   Our billing staff will not be able to assist you with questions regarding bills from these companies.  You will be contacted with the lab results as soon as they are available. The fastest way to get your results is to activate your My Chart account. Instructions are located on the last page of this paperwork. If you have not heard from [place] regarding the results in 2 weeks, please contact this office.       I personally performed the services described in this documentation, which was scribed in  my presence. The recorded information has been reviewed and considered for accuracy and completeness, addended by me as needed, and agree with information above.  Signed,   [name], MD [facility] [place].  08/13/17 11:47 AM

## 2017-08-13 NOTE — Patient Instructions (Addendum)
  Thanks for coming in today. I suspect your hemoglobin A1c will be improved with the higher dose of glipizide. We will let you know those results in the next 2 weeks. Continue same dose of medications for now, and follow-up in 3 months.   IF you received an x-ray today, you will receive an invoice from [facility]. Please contact [facility] at [phone] with questions or concerns regarding your invoice.   IF you received labwork today, you will receive an invoice from [place]. Please contact [facility] at [phone] with questions or concerns regarding your invoice.   Our billing staff will not be able to assist you with questions regarding bills from these companies.  You will be contacted with the lab results as soon as they are available. The fastest way to get your results is to activate your My Chart account. Instructions are located on the last page of this paperwork. If you have not heard from [place] regarding the results in 2 weeks, please contact this office.

## 2017-08-14 LAB — HEPATITIS C ANTIBODY: Hep C Virus Ab: <0.1 s/co ratio (ref 0.0–0.9)

## 2017-08-14 LAB — HEMOGLOBIN A1C
Est. average glucose Bld gHb Est-mCnc: 189 mg/dL
Hgb A1c MFr Bld: 8.2 % — ABNORMAL HIGH (ref 4.8–5.6)

## 2017-09-16 ENCOUNTER — Other Ambulatory Visit: Payer: Self-pay | Admitting: Family Medicine

## 2017-09-16 ENCOUNTER — Telehealth: Payer: Self-pay | Admitting: Family Medicine

## 2017-09-16 NOTE — Telephone Encounter (Signed)
No OV or LR or any notes or chart information

## 2017-09-16 NOTE — Telephone Encounter (Signed)
Called patient and left message for him to give [place] a call back regarding his choice of pharmacies with his sister. She stated that she would.

## 2017-09-17 ENCOUNTER — Telehealth: Payer: Self-pay

## 2017-09-17 NOTE — Telephone Encounter (Signed)
The patient called back and stated he use [facility] [address] # [phone]

## 2017-09-18 NOTE — Telephone Encounter (Signed)
Phone call to [employer], spoke with [facility]. Explained medication will be denied due to lack of records- no office visit where medication was prescribed.

## 2017-09-21 ENCOUNTER — Telehealth: Payer: Self-pay | Admitting: *Deleted

## 2017-09-21 DIAGNOSIS — G4700 Insomnia, unspecified: Secondary | ICD-10-CM

## 2017-09-21 MED ORDER — SUVOREXANT 15 MG PO TABS: 15.0000 mg | ORAL_TABLET | Freq: Every evening | ORAL | 5 refills | Status: DC | PRN

## 2017-09-21 NOTE — Telephone Encounter (Signed)
Refilled

## 2017-09-21 NOTE — Telephone Encounter (Signed)
Dr. Carlota Raspberry,  Patient called and needs his Belsomra sent to [facility].

## 2017-09-21 NOTE — Addendum Note (Signed)
Addended by: [name] R on: 09/21/2017 03:34 PM   Modules accepted: Orders

## 2017-10-21 ENCOUNTER — Ambulatory Visit (INDEPENDENT_AMBULATORY_CARE_PROVIDER_SITE_OTHER): Payer: Medicare Other | Admitting: Physician Assistant

## 2017-10-21 ENCOUNTER — Encounter: Payer: Self-pay | Admitting: Physician Assistant

## 2017-10-21 VITALS — BP 143/68 | HR 79 | Temp 98.4°F | Resp 16 | Ht 67.0 in | Wt 180.6 lb

## 2017-10-21 DIAGNOSIS — J069 Acute upper respiratory infection, unspecified: Secondary | ICD-10-CM

## 2017-10-21 MED ORDER — BENZONATATE 100 MG PO CAPS: 100.0000 mg | ORAL_CAPSULE | Freq: Three times a day (TID) | ORAL | 0 refills | Status: DC | PRN

## 2017-10-21 MED ORDER — IPRATROPIUM BROMIDE 0.03 % NA SOLN: 2.0000 | Freq: Two times a day (BID) | NASAL | 0 refills | Status: DC

## 2017-10-21 MED ORDER — GUAIFENESIN ER 1200 MG PO TB12: 1.0000 | ORAL_TABLET | Freq: Two times a day (BID) | ORAL | 1 refills | Status: DC | PRN

## 2017-10-21 NOTE — Patient Instructions (Addendum)
Please make sure you hydrate well with 64 oz of water if not more.  This is almost 4 regular sized water bottles.   This is the only way that the mucinex can fully work.   The tessalon perles are for your coughing.   Upper Respiratory Infection, Adult Most upper respiratory infections (URIs) are caused by a virus. A URI affects the nose, throat, and upper air passages. The most common type of URI is often called "the common cold." Follow these instructions at home:  Take medicines only as told by your doctor.  Gargle warm saltwater or take cough drops to comfort your throat as told by your doctor.  Use a warm mist humidifier or inhale steam from a shower to increase air moisture. This may make it easier to breathe.  Drink enough fluid to keep your pee (urine) clear or pale yellow.  Eat soups and other clear broths.  Have a healthy diet.  Rest as needed.  Go back to work when your fever is gone or your doctor says it is okay. ? You may need to stay home longer to avoid giving your URI to others. ? You can also wear a face mask and wash your hands often to prevent spread of the virus.  Use your inhaler more if you have asthma.  Do not use any tobacco products, including cigarettes, chewing tobacco, or electronic cigarettes. If you need help quitting, ask your doctor. Contact a doctor if:  You are getting worse, not better.  Your symptoms are not helped by medicine.  You have chills.  You are getting more short of breath.  You have brown or red mucus.  You have yellow or brown discharge from your nose.  You have pain in your face, especially when you bend forward.  You have a fever.  You have puffy (swollen) neck glands.  You have pain while swallowing.  You have white areas in the back of your throat. Get help right away if:  You have very bad or constant: ? Headache. ? Ear pain. ? Pain in your forehead, behind your eyes, and over your cheekbones (sinus  pain). ? Chest pain.  You have long-lasting (chronic) lung disease and any of the following: ? Wheezing. ? Long-lasting cough. ? Coughing up blood. ? A change in your usual mucus.  You have a stiff neck.  You have changes in your: ? Vision. ? Hearing. ? Thinking. ? Mood. This information is not intended to replace advice given to you by your health care provider. Make sure you discuss any questions you have with your health care provider. Document Released: 01/07/2008 Document Revised: 03/23/2016 Document Reviewed: 10/26/2013 Elsevier Interactive Patient Education  2018 [employer].    IF you received an x-ray today, you will receive an invoice from [facility]. Please contact [facility] at [phone] with questions or concerns regarding your invoice.   IF you received labwork today, you will receive an invoice from [place]. Please contact [facility] at [phone] with questions or concerns regarding your invoice.   Our billing staff will not be able to assist you with questions regarding bills from these companies.  You will be contacted with the lab results as soon as they are available. The fastest way to get your results is to activate your My Chart account. Instructions are located on the last page of this paperwork. If you have not heard from [place] regarding the results in 2 weeks, please contact this office.

## 2017-10-21 NOTE — Progress Notes (Signed)
PRIMARY CARE AT [facility] [address], [place] [address] [phone]  Date:  10/21/2017   Name:  William Oneill   DOB:  1946-08-02   MRN:  [id number]  PCP:  [name], MD    History of Present Illness:  William Oneill is a 72 y.o. male patient who presents to PCP with  Chief Complaint  Patient presents with  . Cough  . Sore Throat     Yesterday, sore throat , coughing.  Nasal congestion.  No sob or dyspnea.  No fever.  Runny nose present.  Pain with swallowing.  He is not coughing productively at this time.  He has not taken anything for his symptoms.    Patient Active Problem List   Diagnosis Date Noted  . Generalized weakness 04/11/2015  . Acute purulent bronchitis 04/11/2015  . Dehydration 04/11/2015  . Nocturia more than twice per night 01/16/2015  . Snoring 01/16/2015  . 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 Obese abdomen 01/16/2015  . Other fatigue 01/16/2015  . Type 2 diabetes mellitus without complication ([place]) 56/25/6389  . Insomnia     Past Medical History:  Diagnosis Date  . Diabetes mellitus without complication ([place])   . High cholesterol   . Insomnia     History reviewed. No pertinent surgical history.  Social History   Tobacco Use  . Smoking status: Never Smoker  . Smokeless tobacco: Never Used  Substance Use Topics  . Alcohol use: No    Alcohol/week: 0.0 oz  . Drug use: No    Family History  Problem Relation Age of Onset  . Heart disease Father   . Heart attack Father   . Heart disease Sister   . Diabetes Sister   . Stroke Mother     No Known Allergies  Medication list has been reviewed and updated.  Current Outpatient Medications on File Prior to Visit  Medication Sig Dispense Refill  . aspirin EC 81 MG tablet Take 81 mg by mouth daily.    Marland Kitchen atorvastatin (LIPITOR) 10 MG tablet Take 1 tablet (10 mg total) by mouth daily. 90 tablet 1  . blood glucose meter kit and supplies Dispense based on patient and  insurance preference. Use up to four times daily as directed. (FOR ICD-9 250.00, 250.01). 1 each 0  . glipiZIDE (GLUCOTROL) 5 MG tablet Take 1 tablet (5 mg total) by mouth 2 (two) times daily before a meal. 180 tablet 1  . metFORMIN (GLUCOPHAGE) 1000 MG tablet Take 1 tablet (1,000 mg total) by mouth 2 (two) times daily with a meal. 180 tablet 1  . Multiple Vitamin (MULTIVITAMIN WITH MINERALS) TABS tablet Take 1 tablet by mouth daily.    . ONE TOUCH ULTRA TEST test strip USE AS DIRECTED TO TEST UP TO 4 TIMES A DAY 100 each 3  . sitaGLIPtin (JANUVIA) 100 MG tablet Take 1 tablet (100 mg total) by mouth daily. 90 tablet 1  . Suvorexant (BELSOMRA) 15 MG TABS Take 15 mg by mouth at bedtime as needed. 30 tablet 5  . Zoster Vaccine Live, PF, (ZOSTAVAX) 37342 UNT/0.65ML injection Inject 19,400 Units into the skin once. 1 each 0   No current facility-administered medications on file prior to visit.     ROS ROS otherwise unremarkable unless listed above.  Physical Examination: BP (!) 143/68 (BP Location: Right Arm, Patient Position: Sitting, Cuff Size: Normal)   Pulse 79   Temp 98.4 F (36.9 C) (Oral)   Resp  16   Ht '5\' 7"'$  (1.702 m)   Wt 180 lb 9.6 oz (81.9 kg)   SpO2 98%   BMI 28.29 kg/m  Ideal Body Weight: Weight in (lb) to have BMI = 25: 159.3  Physical Exam  Constitutional: He is oriented to person, place, and time. He appears well-developed and well-nourished. No distress.  HENT:  Head: Atraumatic.  Right Ear: Tympanic membrane, external ear and ear canal normal.  Left Ear: Tympanic membrane, external ear and ear canal normal.  Nose: Mucosal edema and rhinorrhea present. Right sinus exhibits no maxillary sinus tenderness and no frontal sinus tenderness. Left sinus exhibits no maxillary sinus tenderness and no frontal sinus tenderness.  Mouth/Throat: No uvula swelling. No oropharyngeal exudate, posterior oropharyngeal edema or posterior oropharyngeal erythema.  Eyes: Pupils are equal,  round, and reactive to light. Conjunctivae, EOM and lids are normal. Right eye exhibits normal extraocular motion. Left eye exhibits normal extraocular motion.  Neck: Trachea normal and full passive range of motion without pain. No edema and no erythema present.  Cardiovascular: Normal rate.  Pulmonary/Chest: Effort normal. No respiratory distress. He has no decreased breath sounds. He has no wheezes. He has no rhonchi.  Neurological: He is alert and oriented to person, place, and time.  Skin: Skin is warm and dry. He is not diaphoretic.  Psychiatric: He has a normal mood and affect. His behavior is normal.     Assessment and Plan: William Oneill is a 72 y.o. male who is here today for cc of  Chief Complaint  Patient presents with  . Cough  . Sore Throat  treating supportively at this time.    Acute upper respiratory infection - Plan: Guaifenesin (MUCINEX MAXIMUM STRENGTH) 1200 MG TB12, ipratropium (ATROVENT) 0.03 % nasal spray, benzonatate (TESSALON) 100 MG capsule  Ivar Drape, PA-C [facility] 3/29/20199:53 AM

## 2017-11-02 ENCOUNTER — Encounter: Payer: Self-pay | Admitting: Physician Assistant

## 2017-11-06 ENCOUNTER — Other Ambulatory Visit: Payer: Self-pay

## 2017-11-06 ENCOUNTER — Ambulatory Visit (INDEPENDENT_AMBULATORY_CARE_PROVIDER_SITE_OTHER): Payer: Medicare Other

## 2017-11-06 ENCOUNTER — Ambulatory Visit (INDEPENDENT_AMBULATORY_CARE_PROVIDER_SITE_OTHER): Payer: Medicare Other | Admitting: Physician Assistant

## 2017-11-06 VITALS — BP 122/68 | HR 82 | Temp 98.2°F | Ht 67.0 in | Wt 175.6 lb

## 2017-11-06 DIAGNOSIS — R059 Cough, unspecified: Secondary | ICD-10-CM

## 2017-11-06 DIAGNOSIS — J069 Acute upper respiratory infection, unspecified: Secondary | ICD-10-CM | POA: Diagnosis not present

## 2017-11-06 DIAGNOSIS — D649 Anemia, unspecified: Secondary | ICD-10-CM | POA: Diagnosis not present

## 2017-11-06 DIAGNOSIS — R05 Cough: Secondary | ICD-10-CM | POA: Diagnosis not present

## 2017-11-06 LAB — POCT CBC
Granulocyte percent: 81.1 %G — AB (ref 37–80)
HCT, POC: 33.8 % — AB (ref 43.5–53.7)
Hemoglobin: 10.8 g/dL — AB (ref 14.1–18.1)
Lymph, poc: 1.5 (ref 0.6–3.4)
MCH, POC: 23.6 pg — AB (ref 27–31.2)
MCHC: 32 g/dL (ref 31.8–35.4)
MCV: 73.6 fL — AB (ref 80–97)
MID (cbc): 0.6 (ref 0–0.9)
MPV: 7.6 fL (ref 0–99.8)
PLATELET COUNT, POC: 494 10*3/uL — AB (ref 142–424)
POC Granulocyte: 9 — AB (ref 2–6.9)
POC LYMPH %: 13.9 % (ref 10–50)
POC MID %: 5 %M (ref 0–12)
RBC: 4.59 M/uL — AB (ref 4.69–6.13)
RDW, POC: 16.1 %
WBC: 11.1 10*3/uL — AB (ref 4.6–10.2)

## 2017-11-06 LAB — POCT URINALYSIS DIP (MANUAL ENTRY)
Glucose, UA: 500 mg/dL — AB
Leukocytes, UA: NEGATIVE
Nitrite, UA: NEGATIVE
RBC UA: NEGATIVE
Spec Grav, UA: 1.02 (ref 1.010–1.025)
UROBILINOGEN UA: 4 U/dL — AB
pH, UA: 7 (ref 5.0–8.0)

## 2017-11-06 MED ORDER — HYDROCODONE-HOMATROPINE 5-1.5 MG/5ML PO SYRP: 2.5000 mL | ORAL_SOLUTION | Freq: Every day | ORAL | 0 refills | Status: AC

## 2017-11-06 MED ORDER — BENZONATATE 100 MG PO CAPS: 100.0000 mg | ORAL_CAPSULE | Freq: Two times a day (BID) | ORAL | 0 refills | Status: DC | PRN

## 2017-11-06 NOTE — Progress Notes (Signed)
11/06/2017 12:45 PM   DOB: 09/23/1945 / MRN: [id number]  SUBJECTIVE:  William Oneill is a 72 y.o. male presenting for cough and fatigue.  Patient tells me he is here a few weeks ago and given a nasal spray and cold medications which seem to help however tells me he suddenly started feeling worse 2-3 days ago.  Tells me he was up all night with cough.  He has been advised to receive colonoscopy in the past and tells me today "I just do not want to do it."  He is willing to consider the colon guard.  When I tell him that he is anemic and we need to evaluate his rectum for bleeding he tells me "I am nervous and scared and I just do not want to do that right now."  He tells me he will talk to Dr. Nyoka Cowden about this next Thursday.  He denies easy bruising and bleeding.  He is taking aspirin daily.  He denies GERD.  He denies abdominal pain.  He denies hematuria.  He does not feel dizzy or short of breath with physical exertion.       He has No Known Allergies.   He  has a past medical history of Diabetes mellitus without complication ([place]), High cholesterol, and Insomnia.    He  reports that he has never smoked. He has never used smokeless tobacco. He reports that he does not drink alcohol or use drugs. He  has no sexual activity history on file. The patient  has no past surgical history on file.  His family history includes Diabetes in his sister; Heart attack in his father; Heart disease in his father and sister; Stroke in his mother.  Review of Systems  Constitutional: Positive for malaise/fatigue and weight loss. Negative for chills, diaphoresis and fever.  Respiratory: Positive for cough.   Cardiovascular: Negative for chest pain.  Gastrointestinal: Negative for abdominal pain, nausea and vomiting.  Skin: Negative for itching and rash.  Neurological: Negative for dizziness.    The problem list and medications were reviewed and updated by myself where necessary and exist elsewhere in the  encounter.   OBJECTIVE:  BP 122/68 (BP Location: Left Arm, Patient Position: Sitting, Cuff Size: Normal)   Pulse 82   Temp 98.2 F (36.8 C) (Oral)   Ht 5\' 7"  (1.702 m)   Wt 175 lb 9.6 oz (79.7 kg)   SpO2 99%   BMI 27.50 kg/m   Wt Readings from Last 3 Encounters:  11/06/17 175 lb 9.6 oz (79.7 kg)  10/21/17 180 lb 9.6 oz (81.9 kg)  08/13/17 181 lb (82.1 kg)   Temp Readings from Last 3 Encounters:  11/06/17 98.2 F (36.8 C) (Oral)  10/21/17 98.4 F (36.9 C) (Oral)  08/13/17 98 F (36.7 C) (Oral)   BP Readings from Last 3 Encounters:  11/06/17 122/68  10/21/17 (!) 143/68  08/13/17 132/72   Pulse Readings from Last 3 Encounters:  11/06/17 82  10/21/17 79  08/13/17 96     Physical Exam  Constitutional: He appears well-developed. He is active and cooperative.  Non-toxic appearance.  HENT:  Mouth/Throat:    Cardiovascular: Normal rate, regular rhythm, S1 normal, S2 normal, normal heart sounds, intact distal pulses and normal pulses. Exam reveals no gallop and no friction rub.  No murmur heard. Pulmonary/Chest: Effort normal. No stridor. No tachypnea. No respiratory distress. He has no wheezes. He has no rales.  Abdominal: He exhibits no distension.  Musculoskeletal: He  exhibits no edema.  Neurological: He is alert.  Skin: Skin is warm and dry. No bruising and no rash noted. He is not diaphoretic. No erythema. No pallor.  Vitals reviewed.    Results for orders placed or performed in visit on 11/06/17 (from the past 72 hour(s))  POCT CBC     Status: Abnormal   Collection Time: 11/06/17  9:52 AM  Result Value Ref Range   WBC 11.1 (A) 4.6 - 10.2 K/uL   Lymph, poc 1.5 0.6 - 3.4   POC LYMPH PERCENT 13.9 10 - 50 %L   MID (cbc) 0.6 0 - 0.9   POC MID % 5.0 0 - 12 %M   POC Granulocyte 9.0 (A) 2 - 6.9   Granulocyte percent 81.1 (A) 37 - 80 %G   RBC 4.59 (A) 4.69 - 6.13 M/uL   Hemoglobin 10.8 (A) 14.1 - 18.1 g/dL   HCT, POC 33.8 (A) 43.5 - 53.7 %   MCV 73.6 (A) 80 -  97 fL   MCH, POC 23.6 (A) 27 - 31.2 pg   MCHC 32.0 31.8 - 35.4 g/dL   RDW, POC 16.1 %   Platelet Count, POC 494 (A) 142 - 424 K/uL   MPV 7.6 0 - 99.8 fL  POCT urinalysis dipstick     Status: Abnormal   Collection Time: 11/06/17 12:42 PM  Result Value Ref Range   Color, UA yellow yellow   Clarity, UA cloudy (A) clear   Glucose, UA =500 (A) negative mg/dL   Bilirubin, UA small (A) negative   Ketones, POC UA small (15) (A) negative mg/dL   Spec Grav, UA 1.020 1.010 - 1.025   Blood, UA negative negative   pH, UA 7.0 5.0 - 8.0   Protein Ur, POC trace (A) negative mg/dL   Urobilinogen, UA 4.0 (A) 0.2 or 1.0 E.U./dL   Nitrite, UA Negative Negative   Leukocytes, UA Negative Negative   CBC Latest Ref Rng & Units 11/06/2017 09/16/2015 04/24/2015  WBC 4.6 - 10.2 K/uL 11.1(A) 7.8 10.1  Hemoglobin 14.1 - 18.1 g/dL 10.8(A) 13.7(A) 13.4(A)  Hematocrit 43.5 - 53.7 % 33.8(A) 39.6(A) 41.4(A)  Platelets 150 - 400 K/uL - - -     Dg Chest 2 View  Result Date: 11/06/2017 CLINICAL DATA:  Cough and protracted upper respiratory infection EXAM: CHEST - 2 VIEW COMPARISON:  09/16/2015 FINDINGS: Cardiac shadow is stable. Lungs are mildly hyper aerated without focal infiltrate. Degenerative changes of the thoracic spine are seen. 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 Electronically Signed   By: Inez Catalina M.D.   On: 11/06/2017 10:14    ASSESSMENT AND PLAN:  Juliano was seen today for cough.  Diagnoses and all orders for this visit:  Cough -     DG Chest 2 View; Future -     HYDROcodone-homatropine (HYCODAN) 5-1.5 MG/5ML syrup; Take 2.5-5 mLs by mouth at bedtime for 5 days.       -     benzonatate (TESSALON) 100 MG capsule; Take 1 capsule (100 mg total) by mouth 2 (two) times daily       as needed for cough.  Protracted URI: Chest exam unrevealing.  He is well appearing. This is likely viral and the white count and platelet elevation are due to the anemia.  I will check a peripheral smear and an iron  panel.   -     POCT CBC  Anemia, unspecified type: Patient refusing rectal exam today.  Of advised I can  evaluate his much as possible but cannot rule out a GI bleed at this time.  He tells me he understands and wants to talk to Dr. Nyoka Cowden about this next Thursday.  I have advised that if any point he becomes dizzy, short of breath, presyncopal that he will need to present to the ED.  His abdominal exam is nontender and there is no bruising about his body.  Of advised that he hold his aspirin until seeing Dr. Nyoka Cowden. -     Pathologist smear review -     Iron, TIBC and Ferritin Panel       The patient is advised to call or return to clinic if he does not see an improvement in symptoms, or to seek the care of the closest emergency department if he worsens with the above plan.   Philis Fendt, MHS, PA-C [facility] 11/06/2017 12:45 PM

## 2017-11-06 NOTE — Patient Instructions (Addendum)
If at any point you begin to have shortness of breath, dizziness, dizziness with standing then please go to the emergency room immediately.  Your cough is likely allergic or viral.  Your chest x-ray was largely unrevealing.  Do not take any more aspirin until seeing Dr. Nyoka Cowden next Thursday.    IF you received an x-ray today, you will receive an invoice from [facility]. Please contact [facility] at [phone] with questions or concerns regarding your invoice.   IF you received labwork today, you will receive an invoice from [facility]. Please contact [facility] at [phone] with questions or concerns regarding your invoice.   Our billing staff will not be able to assist you with questions regarding bills from these companies.  You will be contacted with the lab results as soon as they are available. The fastest way to get your results is to activate your My Chart account. Instructions are located on the last page of this paperwork. If you have not heard from [place] regarding the results in 2 weeks, please contact this office.

## 2017-11-07 ENCOUNTER — Other Ambulatory Visit: Payer: Self-pay

## 2017-11-07 ENCOUNTER — Emergency Department (HOSPITAL_COMMUNITY)
Admission: EM | Admit: 2017-11-07 | Discharge: 2017-11-07 | Disposition: A | Discharge status: Home / Self Care | Payer: Medicare Other | Attending: Emergency Medicine | Admitting: Emergency Medicine

## 2017-11-07 ENCOUNTER — Emergency Department (HOSPITAL_COMMUNITY)
Admission: EM | Admit: 2017-11-07 | Discharge: 2017-11-07 | Discharge status: Against Medical Advice | Payer: Medicare Other | Source: Home / Self Care

## 2017-11-07 ENCOUNTER — Encounter (HOSPITAL_COMMUNITY): Payer: Self-pay | Admitting: *Deleted

## 2017-11-07 DIAGNOSIS — R195 Other fecal abnormalities: Secondary | ICD-10-CM | POA: Diagnosis not present

## 2017-11-07 DIAGNOSIS — Z7982 Long term (current) use of aspirin: Secondary | ICD-10-CM | POA: Insufficient documentation

## 2017-11-07 DIAGNOSIS — D649 Anemia, unspecified: Secondary | ICD-10-CM | POA: Insufficient documentation

## 2017-11-07 DIAGNOSIS — E119 Type 2 diabetes mellitus without complications: Secondary | ICD-10-CM | POA: Insufficient documentation

## 2017-11-07 DIAGNOSIS — Z79899 Other long term (current) drug therapy: Secondary | ICD-10-CM | POA: Diagnosis not present

## 2017-11-07 DIAGNOSIS — Z7984 Long term (current) use of oral hypoglycemic drugs: Secondary | ICD-10-CM | POA: Insufficient documentation

## 2017-11-07 DIAGNOSIS — R799 Abnormal finding of blood chemistry, unspecified: Secondary | ICD-10-CM | POA: Diagnosis present

## 2017-11-07 LAB — CBC
HCT: 33 % — ABNORMAL LOW (ref 39.0–52.0)
Hemoglobin: 10.1 g/dL — ABNORMAL LOW (ref 13.0–17.0)
MCH: 23.4 pg — ABNORMAL LOW (ref 26.0–34.0)
MCHC: 30.6 g/dL (ref 30.0–36.0)
MCV: 76.4 fL — AB (ref 78.0–100.0)
Platelets: 449 10*3/uL — ABNORMAL HIGH (ref 150–400)
RBC: 4.32 MIL/uL (ref 4.22–5.81)
RDW: 15.1 % (ref 11.5–15.5)
WBC: 10.1 10*3/uL (ref 4.0–10.5)

## 2017-11-07 LAB — I-STAT TROPONIN, ED: Troponin i, poc: 0 ng/mL (ref 0.00–0.08)

## 2017-11-07 LAB — COMPREHENSIVE METABOLIC PANEL
ALBUMIN: 3.5 g/dL (ref 3.5–5.0)
ALT: 15 U/L — ABNORMAL LOW (ref 17–63)
AST: 19 U/L (ref 15–41)
Alkaline Phosphatase: 73 U/L (ref 38–126)
Anion gap: 11 (ref 5–15)
BUN: 23 mg/dL — AB (ref 6–20)
CHLORIDE: 107 mmol/L (ref 101–111)
CO2: 23 mmol/L (ref 22–32)
Calcium: 9.2 mg/dL (ref 8.9–10.3)
Creatinine, Ser: 0.94 mg/dL (ref 0.61–1.24)
GFR calc Af Amer: >60 mL/min (ref >60)
GFR calc non Af Amer: >60 mL/min (ref >60)
GLUCOSE: 231 mg/dL — AB (ref 65–99)
Potassium: 4.2 mmol/L (ref 3.5–5.1)
Sodium: 141 mmol/L (ref 135–145)
Total Bilirubin: 1.2 mg/dL (ref 0.3–1.2)
Total Protein: 6.1 g/dL — ABNORMAL LOW (ref 6.5–8.1)

## 2017-11-07 LAB — POC OCCULT BLOOD, ED: Fecal Occult Bld: POSITIVE — AB

## 2017-11-07 MED ORDER — OMEPRAZOLE 20 MG PO CPDR: 20.0000 mg | DELAYED_RELEASE_CAPSULE | Freq: Every day | ORAL | 0 refills | Status: DC

## 2017-11-07 NOTE — ED Provider Notes (Signed)
[facility] EMERGENCY DEPARTMENT Provider Note   CSN: [id number] Arrival date & time: 11/07/17  1557     History   Chief Complaint Chief Complaint  Patient presents with  . Abnormal Lab    HPI William Oneill is a 72 y.o. male.  He presents today after being told that the sheriff came to his house this morning and told him he needed to go to the hospital.  He states yesterday he presented to the urgent care [facility] with a complaint of 1 day of cough and feeling weak.  He had labs drawn there.  They found him to be anemic.  He refused a rectal exam at that time.  Patient states he was treated with a cough medicine and sent home and he feels a little bit better.  He has not noticed any bleeding from above or below.  There is no chest pain no abdominal pain no vomiting or diarrhea.  There is been no fever.  He has no idea why he was sent to the hospital.  The history is provided by the patient.  Abnormal Lab  Time since result:  Yesterday Referred by: urgent care. Result type: hematology   Hematology:    Hematocrit:  Low   Hemoglobin:  Low   Platelets:  High   Other abnormal hematology result:  Mcv low   Past Medical History:  Diagnosis Date  . Diabetes mellitus without complication ([place])   . High cholesterol   . Insomnia     Patient Active Problem List   Diagnosis Date Noted  . Generalized weakness 04/11/2015  . Acute purulent bronchitis 04/11/2015  . Dehydration 04/11/2015  . Nocturia more than twice per night 01/16/2015  . Snoring 01/16/2015  . 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 Obese abdomen 01/16/2015  . Other fatigue 01/16/2015  . Type 2 diabetes mellitus without complication ([place]) 69/67/8938  . Insomnia     History reviewed. No pertinent surgical history.      Home Medications    Prior to Admission medications   Medication Sig Start Date End Date Taking? Authorizing Provider  aspirin EC 81 MG tablet Take 81 mg by  mouth daily.    [provider]  atorvastatin (LIPITOR) 10 MG tablet Take 1 tablet (10 mg total) by mouth daily. 04/30/17   [name], MD  benzonatate (TESSALON) 100 MG capsule Take 1 capsule (100 mg total) by mouth 2 (two) times daily as needed for cough. 11/06/17   Tereasa Coop, PA-C  blood glucose meter kit and supplies Dispense based on patient and insurance preference. Use up to four times daily as directed. (FOR ICD-9 250.00, 250.01). 12/11/14   [name], MD  glipiZIDE (GLUCOTROL) 5 MG tablet Take 1 tablet (5 mg total) by mouth 2 (two) times daily before a meal. 08/13/17   [name], MD  HYDROcodone-homatropine [facility]) 5-1.5 MG/5ML syrup Take 2.5-5 mLs by mouth at bedtime for 5 days. 11/06/17 11/11/17  Tereasa Coop, PA-C  ipratropium (ATROVENT) 0.03 % nasal spray Place 2 sprays into both nostrils 2 (two) times daily. 10/21/17   Ivar Drape D, PA  metFORMIN (GLUCOPHAGE) 1000 MG tablet Take 1 tablet (1,000 mg total) by mouth 2 (two) times daily with a meal. 08/13/17   [name], MD  Multiple Vitamin (MULTIVITAMIN WITH MINERALS) TABS tablet Take 1 tablet by mouth daily.    [provider]  ONE TOUCH ULTRA TEST test strip USE AS  DIRECTED TO TEST UP TO 4 TIMES A DAY 05/14/15   [name], MD  sitaGLIPtin (JANUVIA) 100 MG tablet Take 1 tablet (100 mg total) by mouth daily. 08/13/17   [name], MD  Suvorexant (BELSOMRA) 15 MG TABS Take 15 mg by mouth at bedtime as needed. 09/21/17   [name], MD  Zoster Vaccine Live, PF, (ZOSTAVAX) 20254 UNT/0.65ML injection Inject 19,400 Units into the skin once. 01/10/16   [name], MD    Family History Family History  Problem Relation Age of Onset  . Heart disease Father   . Heart attack Father   . Heart disease Sister   . Diabetes Sister   . Stroke Mother     Social History Social History   Tobacco Use  . Smoking status: Never Smoker  . Smokeless tobacco: Never  Used  Substance Use Topics  . Alcohol use: No    Alcohol/week: 0.0 oz  . Drug use: No     Allergies   Patient has no known allergies.   Review of Systems Review of Systems  Constitutional: Positive for fatigue. Negative for fever.  HENT: Positive for rhinorrhea. Negative for ear pain and sore throat.   Eyes: Negative for pain and visual disturbance.  Respiratory: Positive for cough. Negative for shortness of breath.   Cardiovascular: Negative for chest pain.  Gastrointestinal: Negative for abdominal pain, anal bleeding, blood in stool, nausea and vomiting.  Genitourinary: Negative for dysuria and frequency.  Musculoskeletal: Negative for back pain and neck pain.  Skin: Negative for rash.  Neurological: Negative for dizziness and syncope.     Physical Exam Updated Vital Signs BP (!) 114/45 (BP Location: Right Arm)   Pulse 86   Temp 98.7 F (37.1 C) (Oral)   Resp 17   Ht '5\' 7"'$  (1.702 m)   Wt 79.4 kg (175 lb)   SpO2 98%   BMI 27.41 kg/m   Physical Exam  Constitutional: He appears well-developed and well-nourished.  HENT:  Head: Normocephalic and atraumatic.  Eyes: Conjunctivae are normal.  Neck: Neck supple.  Cardiovascular: Normal rate and regular rhythm.  No murmur heard. Pulmonary/Chest: Effort normal and breath sounds normal. No respiratory distress.  Abdominal: Soft. There is no tenderness.  Musculoskeletal: Normal range of motion. He exhibits no edema, tenderness or deformity.  Neurological: He is alert.  Skin: Skin is warm and dry. Capillary refill takes less than 2 seconds.  Psychiatric: He has a normal mood and affect.  Nursing note and vitals reviewed.    ED Treatments / Results  Labs (all labs ordered are listed, but only abnormal results are displayed) Labs Reviewed  COMPREHENSIVE METABOLIC PANEL - Abnormal; Notable for the following components:      Result Value   Glucose, Bld 231 (*)    BUN 23 (*)    Total Protein 6.1 (*)    ALT 15 (*)     All other components within normal limits  CBC - Abnormal; Notable for the following components:   Hemoglobin 10.1 (*)    HCT 33.0 (*)    MCV 76.4 (*)    MCH 23.4 (*)    Platelets 449 (*)    All other components within normal limits    EKG EKG Interpretation  Date/Time:  Saturday November 07 2017 18:35:40 EDT Ventricular Rate:  69 PR Interval:    QRS Duration: 99 QT Interval:  385 QTC Calculation: 413 R Axis:   61 Text Interpretation:  sinus arrhythmia Short  PR interval Abnormal R-wave progression, early transition Minimal ST depression, diffuse leads similar to prior 9/17 Confirmed by Aletta Edouard [id number]) on 11/07/2017 6:42:13 PM   Radiology Dg Chest 2 View  Result Date: 11/06/2017 CLINICAL DATA:  Cough and protracted upper respiratory infection EXAM: CHEST - 2 VIEW COMPARISON:  09/16/2015 FINDINGS: Cardiac shadow is stable. Lungs are mildly hyper aerated without focal infiltrate. Degenerative changes of the thoracic spine are seen. 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 Electronically Signed   By: Inez Catalina M.D.   On: 11/06/2017 10:14    Procedures Procedures (including critical care time)  Medications Ordered in ED Medications - No data to display   Initial Impression / Assessment and Plan / ED Course  I have reviewed the triage vital signs and the nursing notes.  Pertinent labs & imaging results that were available during my care of the patient were reviewed by me and considered in my medical decision making (see chart for details).  Clinical Course as of Nov 09 1121  Sat Nov 07, 2017  1919 Reviewed the labs done at urgent care.  They had identified that he had a new anemia and sent off some iron studies.  I offered and was able to get a rectal exam on the patient and he is trace positive with brown stool.  No indication for admission and he has an appointment with his PCP on Thursday.  I think it is reasonable he can have an outpatient workup for this and probably  referral to GI for further management.  We will place him on a PPI for now.   [MB]    Clinical Course User Index [MB] Hayden Rasmussen, MD    Final Clinical Impressions(s) / ED Diagnoses   Final diagnoses:  Anemia, unspecified type  Guaiac positive stools    ED Discharge Orders        Ordered    omeprazole (PRILOSEC) 20 MG capsule  Daily     11/07/17 1923       Hayden Rasmussen, MD 11/09/17 1124

## 2017-11-07 NOTE — ED Notes (Signed)
Called x 3 NO answer 

## 2017-11-07 NOTE — Discharge Instructions (Signed)
Your evaluated in the emergency department for abnormal labs.  You were found to be anemic.  We also did a rectal exam and you were positive for blood.  This is likely the cause of your anemia.  You will need follow-up with your doctor which you have scheduled for Thursday.  We are prescribing you an acid medication to take and you will likely need further testing.  You should return to the emergency department if you have any chest pain or shortness of breath or other concerns.

## 2017-11-07 NOTE — ED Triage Notes (Signed)
The pt is c/o of feeling weak yesterday with a cough he was seen by his doctor yesterday  Today the sheriffs dept came by told him to go immediately to [facility].. He has no idea   He feels good no pain anywhere  laert oriented

## 2017-11-07 NOTE — ED Notes (Signed)
Called x 2 NO answer 

## 2017-11-09 NOTE — Addendum Note (Signed)
Addended by: Gari Crown D on: 11/09/2017 09:00 AM   Modules accepted: Orders

## 2017-11-10 LAB — RENAL FUNCTION PANEL
Albumin: 4.2 g/dL (ref 3.5–4.8)
BUN / CREAT RATIO: 28 — AB (ref 10–24)
BUN: 25 mg/dL (ref 8–27)
CALCIUM: 9.1 mg/dL (ref 8.6–10.2)
CO2: 21 mmol/L (ref 20–29)
CREATININE: 0.89 mg/dL (ref 0.76–1.27)
Chloride: 105 mmol/L (ref 96–106)
GFR calc Af Amer: 100 mL/min/{1.73_m2} (ref >59)
GFR calc non Af Amer: 87 mL/min/{1.73_m2} (ref >59)
Glucose: 216 mg/dL — ABNORMAL HIGH (ref 65–99)
PHOSPHORUS: 3.3 mg/dL (ref 2.5–4.5)
POTASSIUM: 4.6 mmol/L (ref 3.5–5.2)
SODIUM: 139 mmol/L (ref 134–144)

## 2017-11-12 ENCOUNTER — Encounter: Payer: Self-pay | Admitting: Family Medicine

## 2017-11-12 ENCOUNTER — Ambulatory Visit (INDEPENDENT_AMBULATORY_CARE_PROVIDER_SITE_OTHER): Payer: Medicare Other | Admitting: Family Medicine

## 2017-11-12 ENCOUNTER — Other Ambulatory Visit: Payer: Self-pay

## 2017-11-12 VITALS — BP 140/66 | HR 65 | Temp 98.5°F | Ht 66.5 in | Wt 179.0 lb

## 2017-11-12 DIAGNOSIS — E119 Type 2 diabetes mellitus without complications: Secondary | ICD-10-CM | POA: Diagnosis not present

## 2017-11-12 DIAGNOSIS — E1165 Type 2 diabetes mellitus with hyperglycemia: Secondary | ICD-10-CM

## 2017-11-12 DIAGNOSIS — E785 Hyperlipidemia, unspecified: Secondary | ICD-10-CM | POA: Diagnosis not present

## 2017-11-12 DIAGNOSIS — IMO0001 Reserved for inherently not codable concepts without codable children: Secondary | ICD-10-CM

## 2017-11-12 DIAGNOSIS — G4700 Insomnia, unspecified: Secondary | ICD-10-CM

## 2017-11-12 DIAGNOSIS — R195 Other fecal abnormalities: Secondary | ICD-10-CM

## 2017-11-12 DIAGNOSIS — D649 Anemia, unspecified: Secondary | ICD-10-CM

## 2017-11-12 LAB — POCT CBC
Granulocyte percent: 72.3 %G (ref 37–80)
HCT, POC: 35.1 % — AB (ref 43.5–53.7)
HEMOGLOBIN: 11 g/dL — AB (ref 14.1–18.1)
LYMPH, POC: 2 (ref 0.6–3.4)
MCH, POC: 23.5 pg — AB (ref 27–31.2)
MCHC: 31.4 g/dL — AB (ref 31.8–35.4)
MCV: 74.7 fL — AB (ref 80–97)
MID (CBC): 0.4 (ref 0–0.9)
MPV: 7.8 fL (ref 0–99.8)
PLATELET COUNT, POC: 440 10*3/uL — AB (ref 142–424)
POC Granulocyte: 6.1 (ref 2–6.9)
POC LYMPH PERCENT: 23.1 %L (ref 10–50)
POC MID %: 4.6 % (ref 0–12)
RBC: 4.69 M/uL (ref 4.69–6.13)
RDW, POC: 15.7 %
WBC: 8.5 10*3/uL (ref 4.6–10.2)

## 2017-11-12 LAB — POCT GLYCOSYLATED HEMOGLOBIN (HGB A1C): Hemoglobin A1C: 7.5

## 2017-11-12 MED ORDER — SITAGLIPTIN PHOSPHATE 100 MG PO TABS: 100.0000 mg | ORAL_TABLET | Freq: Every day | ORAL | 1 refills | Status: DC

## 2017-11-12 MED ORDER — HYDROXYZINE HCL 25 MG PO TABS: 12.5000 mg | ORAL_TABLET | Freq: Every evening | ORAL | 0 refills | Status: DC | PRN

## 2017-11-12 MED ORDER — ATORVASTATIN CALCIUM 10 MG PO TABS: 10.0000 mg | ORAL_TABLET | Freq: Every day | ORAL | 1 refills | Status: DC

## 2017-11-12 MED ORDER — METFORMIN HCL 1000 MG PO TABS: 1000.0000 mg | ORAL_TABLET | Freq: Two times a day (BID) | ORAL | 1 refills | Status: DC

## 2017-11-12 MED ORDER — GLIPIZIDE 5 MG PO TABS: 5.0000 mg | ORAL_TABLET | Freq: Two times a day (BID) | ORAL | 1 refills | Status: DC

## 2017-11-12 NOTE — Patient Instructions (Addendum)
For anemia and blood in stool, I will refer you to gastroenterologist to determine further testing . Continue omeprazole once per day for now.  If at any point you feel lightheaded, dizzy, noticed blood in your stools, or have dark/tarry colored stools return right away. Return to the clinic or go to the nearest emergency room if any of your symptoms worsen or new symptoms occur.  For sleep, can try the hydroxyzine 1-2 pills at bedtime if needed.  Start with the lowest dose watch for lightheadedness or dizziness.  Recheck in the next 2 weeks to see how that medication is working.  Sooner if needed.  For diabetes, okay to continue same dose of medication for now as your hemoglobin A1c is slightly improved.  I would like to see it a little bit better, so continue to watch diet, walking for exercise most days per week and recheck levels in 3 months.    Anemia Anemia is a condition in which you do not have enough red blood cells or hemoglobin. Hemoglobin is a substance in red blood cells that carries oxygen. When you do not have enough red blood cells or hemoglobin (are anemic), your body cannot get enough oxygen and your organs may not work properly. As a result, you may feel very tired or have other problems. What are the causes? Common causes of anemia include:  Excessive bleeding. Anemia can be caused by excessive bleeding inside or outside the body, including bleeding from the intestine or from periods in women.  Poor nutrition.  Long-lasting (chronic) kidney, thyroid, and liver disease.  Bone marrow disorders.  Cancer and treatments for cancer.  HIV (human immunodeficiency virus) and AIDS (acquired immunodeficiency syndrome).  Treatments for HIV and AIDS.  Spleen problems.  Blood disorders.  Infections, medicines, and autoimmune disorders that destroy red blood cells.  What are the signs or symptoms? Symptoms of this condition include:  Minor  weakness.  Dizziness.  Headache.  Feeling heartbeats that are irregular or faster than normal (palpitations).  Shortness of breath, especially with exercise.  Paleness.  Cold sensitivity.  Indigestion.  Nausea.  Difficulty sleeping.  Difficulty concentrating.  Symptoms may occur suddenly or develop slowly. If your anemia is mild, you may not have symptoms. How is this diagnosed? This condition is diagnosed based on:  Blood tests.  Your medical history.  A physical exam.  Bone marrow biopsy.  Your health care provider may also check your stool (feces) for blood and may do additional testing to look for the cause of your bleeding. You may also have other tests, including:  Imaging tests, such as a CT scan or MRI.  Endoscopy.  Colonoscopy.  How is this treated? Treatment for this condition depends on the cause. If you continue to lose a lot of blood, you may need to be treated at a hospital. Treatment may include:  Taking supplements of iron, vitamin U44, or folic acid.  Taking a hormone medicine (erythropoietin) that can help to stimulate red blood cell growth.  Having a blood transfusion. This may be needed if you lose a lot of blood.  Making changes to your diet.  Having surgery to remove your spleen.  Follow these instructions at home:  Take over-the-counter and prescription medicines only as told by your health care provider.  Take supplements only as told by your health care provider.  Follow any diet instructions that you were given.  Keep all follow-up visits as told by your health care provider. This is important.  Contact a health care provider if:  You develop new bleeding anywhere in the body. Get help right away if:  You are very weak.  You are short of breath.  You have pain in your abdomen or chest.  You are dizzy or feel faint.  You have trouble concentrating.  You have bloody or black, tarry stools.  You vomit repeatedly  or you vomit up blood. Summary  Anemia is a condition in which you do not have enough red blood cells or enough of a substance in your red blood cells that carries oxygen (hemoglobin).  Symptoms may occur suddenly or develop slowly.  If your anemia is mild, you may not have symptoms.  This condition is diagnosed with blood tests as well as a medical history and physical exam. Other tests may be needed.  Treatment for this condition depends on the cause of the anemia. This information is not intended to replace advice given to you by your health care provider. Make sure you discuss any questions you have with your health care provider. Document Released: 08/28/2004 Document Revised: 08/22/2016 Document Reviewed: 08/22/2016 Elsevier Interactive Patient Education  2018 [employer].    IF you received an x-ray today, you will receive an invoice from [facility]iology. Please contact [facility]iology at [phone] with questions or concerns regarding your invoice.   IF you received labwork today, you will receive an invoice from [place]. Please contact [facility] at [phone] with questions or concerns regarding your invoice.   Our billing staff will not be able to assist you with questions regarding bills from these companies.  You will be contacted with the lab results as soon as they are available. The fastest way to get your results is to activate your My Chart account. Instructions are located on the last page of this paperwork. If you have not heard from [place] regarding the results in 2 weeks, please contact this office.

## 2017-11-12 NOTE — Progress Notes (Signed)
Subjective:  By signing my name below, I, [name], attest that this documentation has been prepared under the direction and in the presence of [name], MD. Electronically Signed: [name], [place]. 11/12/2017 , 10:48 AM .  Patient was seen in Room 10 .   Patient ID: William Oneill, male    DOB: [birth year], 72 y.o.   MRN: [id number] Chief Complaint  Patient presents with  . Hospitalization Follow-up    Diabetes Check   HPI William Oneill is a 72 y.o. male Here for follow up of diabetes, as well as hospital follow up.   He is currently staying with one of his sisters.   Hospital follow up  Patient was seen by Philis Fendt on April 5th for a cough. He was noted to be anemic with hemoglobin of 10.8. Chest xray without infiltrate. With the anemia, a ferritin was sent and aspirin was held. He was sent to the ER for further evaluation. He was hem-positive for fecal occult stool testing. He had hemoglobin of 10.1 in the ER and MCV 76. BP was stable at that time with 114/45 and heart rate of 86. At the ER visit, he was started on PPI, omeprazole 31m QD.   We had discussed colonoscopy or ColoGuard previously, but he had declined. At that time, advised to let me know when he was ready.   He declines having any abdominal pains. His cough has improved. He denies black, tarry stools or blood in stools. He denies dizziness or lightheadedness.   Trouble sleeping See previous notes. He's been on Belsomra in the past. He's been on Ambien in the past for relief. He denies snoring normally unless really tired. He denies waking up choking.   Diabetes Lab Results  Component Value Date   HGBA1C 7.5 11/12/2017   He had previously increased glipizide to 520mBID, and continued on Januvia and metformin. However, after last A1C, recommended increasing exercise, watching diet and plan for change in medications if still elevated today. His urine microalbumin was normal on Sept 2018. He had  pneumonia vaccine done in 2016 and 2017.   With the weather improving, he plans to increase exercise with more walking.   Lipid panel in sept 2018   Lab Results  Component Value Date   CHOL 141 04/30/2017   HDL 49 04/30/2017   LDLCALC 73 04/30/2017   TRIG 96 04/30/2017   CHOLHDL 2.9 04/30/2017   He takes Lipitor 1086mD.   Lab Results  Component Value Date   ALT 15 (L) 11/07/2017   AST 19 11/07/2017   ALKPHOS 73 11/07/2017   BILITOT 1.2 11/07/2017     Patient Active Problem List   Diagnosis Date Noted  . Generalized weakness 04/11/2015  . Acute purulent bronchitis 04/11/2015  . Dehydration 04/11/2015  . Nocturia more than twice per night 01/16/2015  . Snoring 01/16/2015  . 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 Obese abdomen 01/16/2015  . Other fatigue 01/16/2015  . Type 2 diabetes mellitus without complication ([id number] Insomnia    Past Medical History:  Diagnosis Date  . Diabetes mellitus without complication ([place] . High cholesterol   . Insomnia    No past surgical history on file. No Known Allergies Prior to Admission medications   Medication Sig Start Date End Date Taking? Authorizing Provider  aspirin EC 81 MG tablet Take 81 mg by mouth daily.    [provider]  atorvastatin (LIPITOR) 10  MG tablet Take 1 tablet (10 mg total) by mouth daily. 04/30/17   [name], MD  benzonatate (TESSALON) 100 MG capsule Take 1 capsule (100 mg total) by mouth 2 (two) times daily as needed for cough. 11/06/17   Tereasa Coop, PA-C  blood glucose meter kit and supplies Dispense based on patient and insurance preference. Use up to four times daily as directed. (FOR ICD-9 250.00, 250.01). 12/11/14   [name], MD  glipiZIDE (GLUCOTROL) 5 MG tablet Take 1 tablet (5 mg total) by mouth 2 (two) times daily before a meal. 08/13/17   [name], MD  ipratropium (ATROVENT) 0.03 % nasal spray Place 2 sprays into both nostrils 2  (two) times daily. 10/21/17   Ivar Drape D, PA  metFORMIN (GLUCOPHAGE) 1000 MG tablet Take 1 tablet (1,000 mg total) by mouth 2 (two) times daily with a meal. 08/13/17   [name], MD  Multiple Vitamin (MULTIVITAMIN WITH MINERALS) TABS tablet Take 1 tablet by mouth daily.    [provider]  omeprazole (PRILOSEC) 20 MG capsule Take 1 capsule (20 mg total) by mouth daily. 11/07/17   Hayden Rasmussen, MD  ONE TOUCH ULTRA TEST test strip USE AS DIRECTED TO TEST UP TO 4 TIMES A DAY 05/14/15   [name], MD  sitaGLIPtin (JANUVIA) 100 MG tablet Take 1 tablet (100 mg total) by mouth daily. 08/13/17   [name], MD  Suvorexant (BELSOMRA) 15 MG TABS Take 15 mg by mouth at bedtime as needed. 09/21/17   [name], MD  Zoster Vaccine Live, PF, (ZOSTAVAX) 26378 UNT/0.65ML injection Inject 19,400 Units into the skin once. 01/10/16   [name], MD   Social History   Socioeconomic History  . Marital status: Married    Spouse name: Not on file  . Number of children: Not on file  . Years of education: Not on file  . Highest education level: Not on file  Occupational History  . Not on file  Social Needs  . Financial resource strain: Not on file  . Food insecurity:    Worry: Not on file    Inability: Not on file  . Transportation needs:    Medical: Not on file    Non-medical: Not on file  Tobacco Use  . Smoking status: Never Smoker  . Smokeless tobacco: Never Used  Substance and Sexual Activity  . Alcohol use: No    Alcohol/week: 0.0 oz  . Drug use: No  . Sexual activity: Not on file  Lifestyle  . Physical activity:    Days per week: Not on file    Minutes per session: Not on file  . Stress: Not on file  Relationships  . Social connections:    Talks on phone: Not on file    Gets together: Not on file    Attends religious service: Not on file    Active member of club or organization: Not on file    Attends meetings of clubs or organizations:  Not on file    Relationship status: Not on file  . Intimate partner violence:    Fear of current or ex partner: Not on file    Emotionally abused: Not on file    Physically abused: Not on file    Forced sexual activity: Not on file  Other Topics Concern  . Not on file  Social History Narrative  . Not on file   Review of Systems  Constitutional:  Negative for fatigue and unexpected weight change.  Eyes: Negative for visual disturbance.  Respiratory: Negative for cough, chest tightness and shortness of breath.   Cardiovascular: Negative for chest pain, palpitations and leg swelling.  Gastrointestinal: Negative for abdominal pain and blood in stool.  Neurological: Negative for dizziness, light-headedness and headaches.       Objective:   Physical Exam  Constitutional: He is oriented to person, place, and time. He appears well-developed and well-nourished.  HENT:  Head: Normocephalic and atraumatic.  Normal, pink-appearing of the mouth  Eyes: Pupils are equal, round, and reactive to light. EOM are normal.  Neck: No JVD present. Carotid bruit is not present.  Cardiovascular: Normal rate, regular rhythm and normal heart sounds. Exam reveals no gallop and no friction rub.  No murmur heard. Pulmonary/Chest: Effort normal and breath sounds normal. He has no rales.  Abdominal: Soft. Bowel sounds are normal. He exhibits no distension. There is no tenderness.  Musculoskeletal: He exhibits no edema.  Neurological: He is alert and oriented to person, place, and time.  Skin: Skin is warm and dry.  Psychiatric: He has a normal mood and affect.  Vitals reviewed.   Vitals:   11/12/17 1009  BP: 140/66  Pulse: 65  Temp: 98.5 F (36.9 C)  TempSrc: Oral  SpO2: 99%  Weight: 179 lb (81.2 kg)  Height: 5' 6.5" (1.689 m)   Results for orders placed or performed in visit on 11/12/17  POCT CBC  Result Value Ref Range   WBC 8.5 4.6 - 10.2 K/uL   Lymph, poc 2.0 0.6 - 3.4   POC LYMPH PERCENT  23.1 10 - 50 %L   MID (cbc) 0.4 0 - 0.9   POC MID % 4.6 0 - 12 %M   POC Granulocyte 6.1 2 - 6.9   Granulocyte percent 72.3 37 - 80 %G   RBC 4.69 4.69 - 6.13 M/uL   Hemoglobin 11.0 (A) 14.1 - 18.1 g/dL   HCT, POC 35.1 (A) 43.5 - 53.7 %   MCV 74.7 (A) 80 - 97 fL   MCH, POC 23.5 (A) 27 - 31.2 pg   MCHC 31.4 (A) 31.8 - 35.4 g/dL   RDW, POC 15.7 %   Platelet Count, POC 440 (A) 142 - 424 K/uL   MPV 7.8 0 - 99.8 fL  POCT glycosylated hemoglobin (Hb A1C)  Result Value Ref Range   Hemoglobin A1C 7.5         Assessment & Plan:    TREYTON SLIMP is a 72 y.o. male Heme + stool - Plan: Ambulatory referral to Gastroenterology Anemia, unspecified type - Plan: POCT CBC, Ambulatory referral to Gastroenterology  -Fortunately he has not had colon cancer screening, with heme positive stools stressed importance of GI evaluation, likely will have colonoscopy.  Hemoglobin stable at present from ER visit and testing last week.  Continue PPI, follow-up with gastroenterology, and return in 2 weeks for repeat CBC.  RTC/ER precautions given in the interim  Type 2 diabetes mellitus without complication, without long-term current use of insulin ([place]) - Plan: POCT glycosylated hemoglobin (Hb A1C), Lipid panel, glipiZIDE (GLUCOTROL) 5 MG tablet Uncontrolled type 2 diabetes mellitus without complication, without long-term current use of insulin ([place]) - Plan: metFORMIN (GLUCOPHAGE) 1000 MG tablet, sitaGLIPtin (JANUVIA) 100 MG tablet  -Improved A1c, not quite at goal but decided to continue same dose of medications.  Watch diet, walking for exercise with repeat test in 3 months  Hyperlipidemia, unspecified hyperlipidemia type - Plan:  Lipid panel, atorvastatin (LIPITOR) 10 MG tablet  -Tolerating Lipitor, continue same dose, labs pending  Insomnia, unspecified type - Plan: hydrOXYzine (ATARAX/VISTARIL) 25 MG tablet  -Out of Belsomra, but did not find that to be as beneficial.  May have some psychological component  of insomnia with trouble getting to sleep.  Will try hydroxyzine 1/2-1 at bedtime, potential side effects discussed, and recheck in 2 weeks to decide if changes needed.  He has taken Ambien in the past, but would prefer to try hydroxyzine first.   Meds ordered this encounter  Medications  . hydrOXYzine (ATARAX/VISTARIL) 25 MG tablet    Sig: Take 0.5-1 tablets (12.5-25 mg total) by mouth at bedtime as needed for anxiety.    Dispense:  30 tablet    Refill:  0  . metFORMIN (GLUCOPHAGE) 1000 MG tablet    Sig: Take 1 tablet (1,000 mg total) by mouth 2 (two) times daily with a meal.    Dispense:  180 tablet    Refill:  1  . atorvastatin (LIPITOR) 10 MG tablet    Sig: Take 1 tablet (10 mg total) by mouth daily.    Dispense:  90 tablet    Refill:  1  . sitaGLIPtin (JANUVIA) 100 MG tablet    Sig: Take 1 tablet (100 mg total) by mouth daily.    Dispense:  90 tablet    Refill:  1  . glipiZIDE (GLUCOTROL) 5 MG tablet    Sig: Take 1 tablet (5 mg total) by mouth 2 (two) times daily before a meal.    Dispense:  180 tablet    Refill:  1   Patient Instructions    For anemia and blood in stool, I will refer you to gastroenterologist to determine further testing . Continue omeprazole once per day for now.  If at any point you feel lightheaded, dizzy, noticed blood in your stools, or have dark/tarry colored stools return right away. Return to the clinic or go to the nearest emergency room if any of your symptoms worsen or new symptoms occur.  For sleep, can try the hydroxyzine 1-2 pills at bedtime if needed.  Start with the lowest dose watch for lightheadedness or dizziness.  Recheck in the next 2 weeks to see how that medication is working.  Sooner if needed.  For diabetes, okay to continue same dose of medication for now as your hemoglobin A1c is slightly improved.  I would like to see it a little bit better, so continue to watch diet, walking for exercise most days per week and recheck levels in 3  months.    Anemia Anemia is a condition in which you do not have enough red blood cells or hemoglobin. Hemoglobin is a substance in red blood cells that carries oxygen. When you do not have enough red blood cells or hemoglobin (are anemic), your body cannot get enough oxygen and your organs may not work properly. As a result, you may feel very tired or have other problems. What are the causes? Common causes of anemia include:  Excessive bleeding. Anemia can be caused by excessive bleeding inside or outside the body, including bleeding from the intestine or from periods in women.  Poor nutrition.  Long-lasting (chronic) kidney, thyroid, and liver disease.  Bone marrow disorders.  Cancer and treatments for cancer.  HIV (human immunodeficiency virus) and AIDS (acquired immunodeficiency syndrome).  Treatments for HIV and AIDS.  Spleen problems.  Blood disorders.  Infections, medicines, and autoimmune disorders that destroy red blood  cells.  What are the signs or symptoms? Symptoms of this condition include:  Minor weakness.  Dizziness.  Headache.  Feeling heartbeats that are irregular or faster than normal (palpitations).  Shortness of breath, especially with exercise.  Paleness.  Cold sensitivity.  Indigestion.  Nausea.  Difficulty sleeping.  Difficulty concentrating.  Symptoms may occur suddenly or develop slowly. If your anemia is mild, you may not have symptoms. How is this diagnosed? This condition is diagnosed based on:  Blood tests.  Your medical history.  A physical exam.  Bone marrow biopsy.  Your health care provider may also check your stool (feces) for blood and may do additional testing to look for the cause of your bleeding. You may also have other tests, including:  Imaging tests, such as a CT scan or MRI.  Endoscopy.  Colonoscopy.  How is this treated? Treatment for this condition depends on the cause. If you continue to lose a  lot of blood, you may need to be treated at a hospital. Treatment may include:  Taking supplements of iron, vitamin N47, or folic acid.  Taking a hormone medicine (erythropoietin) that can help to stimulate red blood cell growth.  Having a blood transfusion. This may be needed if you lose a lot of blood.  Making changes to your diet.  Having surgery to remove your spleen.  Follow these instructions at home:  Take over-the-counter and prescription medicines only as told by your health care provider.  Take supplements only as told by your health care provider.  Follow any diet instructions that you were given.  Keep all follow-up visits as told by your health care provider. This is important. Contact a health care provider if:  You develop new bleeding anywhere in the body. Get help right away if:  You are very weak.  You are short of breath.  You have pain in your abdomen or chest.  You are dizzy or feel faint.  You have trouble concentrating.  You have bloody or black, tarry stools.  You vomit repeatedly or you vomit up blood. Summary  Anemia is a condition in which you do not have enough red blood cells or enough of a substance in your red blood cells that carries oxygen (hemoglobin).  Symptoms may occur suddenly or develop slowly.  If your anemia is mild, you may not have symptoms.  This condition is diagnosed with blood tests as well as a medical history and physical exam. Other tests may be needed.  Treatment for this condition depends on the cause of the anemia. This information is not intended to replace advice given to you by your health care provider. Make sure you discuss any questions you have with your health care provider. Document Released: 08/28/2004 Document Revised: 08/22/2016 Document Reviewed: 08/22/2016 Elsevier Interactive Patient Education  2018 [employer].    IF you received an x-ray today, you will receive an invoice from [facility]  Radiology. Please contact [facility]iology at [phone] with questions or concerns regarding your invoice.   IF you received labwork today, you will receive an invoice from [place]. Please contact [facility] at [phone] with questions or concerns regarding your invoice.   Our billing staff will not be able to assist you with questions regarding bills from these companies.  You will be contacted with the lab results as soon as they are available. The fastest way to get your results is to activate your My Chart account. Instructions are located on the last page of this paperwork.  If you have not heard from [place] regarding the results in 2 weeks, please contact this office.       I personally performed the services described in this documentation, which was scribed in my presence. The recorded information has been reviewed and considered for accuracy and completeness, addended by me as needed, and agree with information above.  Signed,   [name], MD [facility].  11/12/17 12:58 PM

## 2017-11-13 LAB — LIPID PANEL
CHOLESTEROL TOTAL: 155 mg/dL (ref 100–199)
Chol/HDL Ratio: 3.4 ratio (ref 0.0–5.0)
HDL: 45 mg/dL (ref >39)
LDL CALC: 90 mg/dL (ref 0–99)
TRIGLYCERIDES: 101 mg/dL (ref 0–149)
VLDL CHOLESTEROL CAL: 20 mg/dL (ref 5–40)

## 2017-11-15 LAB — PATHOLOGIST SMEAR REVIEW
BASOS ABS: 0.1 10*3/uL (ref 0.0–0.2)
Basos: 1 %
EOS (ABSOLUTE): 0.4 10*3/uL (ref 0.0–0.4)
Eos: 3 %
HEMATOCRIT: 35.6 % — AB (ref 37.5–51.0)
HEMOGLOBIN: 10.8 g/dL — AB (ref 13.0–17.7)
IMMATURE GRANULOCYTES: 0 %
Immature Grans (Abs): 0 10*3/uL (ref 0.0–0.1)
Lymphocytes Absolute: 1.2 10*3/uL (ref 0.7–3.1)
Lymphs: 11 %
MCH: 23.9 pg — AB (ref 26.6–33.0)
MCHC: 30.3 g/dL — ABNORMAL LOW (ref 31.5–35.7)
MCV: 79 fL (ref 79–97)
Monocytes Absolute: 0.8 10*3/uL (ref 0.1–0.9)
Monocytes: 7 %
NEUTROS PCT: 78 %
Neutrophils Absolute: 8.5 10*3/uL — ABNORMAL HIGH (ref 1.4–7.0)
PLATELETS: 504 10*3/uL — AB (ref 150–379)
RBC: 4.51 x10E6/uL (ref 4.14–5.80)
RDW: 16.2 % — ABNORMAL HIGH (ref 12.3–15.4)
WBC: 11 10*3/uL — ABNORMAL HIGH (ref 3.4–10.8)

## 2017-11-15 LAB — IRON,TIBC AND FERRITIN PANEL
Ferritin: 10 ng/mL — ABNORMAL LOW (ref 30–400)
Iron Saturation: 5 % — CL (ref 15–55)
Iron: 20 ug/dL — ABNORMAL LOW (ref 38–169)
Total Iron Binding Capacity: 368 ug/dL (ref 250–450)
UIBC: 348 ug/dL — AB (ref 111–343)

## 2017-11-23 ENCOUNTER — Encounter: Payer: Self-pay | Admitting: *Deleted

## 2017-11-27 ENCOUNTER — Ambulatory Visit (INDEPENDENT_AMBULATORY_CARE_PROVIDER_SITE_OTHER): Payer: Medicare Other | Admitting: Family Medicine

## 2017-11-27 ENCOUNTER — Encounter: Payer: Self-pay | Admitting: Family Medicine

## 2017-11-27 ENCOUNTER — Other Ambulatory Visit: Payer: Self-pay

## 2017-11-27 VITALS — BP 138/60 | HR 74 | Temp 97.6°F | Ht 66.0 in | Wt 179.2 lb

## 2017-11-27 DIAGNOSIS — G4700 Insomnia, unspecified: Secondary | ICD-10-CM

## 2017-11-27 DIAGNOSIS — D649 Anemia, unspecified: Secondary | ICD-10-CM

## 2017-11-27 MED ORDER — HYDROXYZINE HCL 25 MG PO TABS: 12.5000 mg | ORAL_TABLET | Freq: Every evening | ORAL | 3 refills | Status: DC | PRN

## 2017-11-27 NOTE — Patient Instructions (Addendum)
  I'm glad to hear that hydroxyzine is working ok. Ok to continue that medication as needed at night.   Continue omeprazole daily. [facility] should be calling you for an appointment to see the gastroenterologist.  If you have not heard from them by next week, please call them:  [facility]:  [address], [place], [place] [address]  Phone: [phone]   Return to the clinic or go to the nearest emergency room if any of your symptoms worsen or new symptoms occur.    IF you received an x-ray today, you will receive an invoice from [facility]. Please contact [facility]iology at [phone] with questions or concerns regarding your invoice.   IF you received labwork today, you will receive an invoice from [place]. Please contact [facility] at [phone] with questions or concerns regarding your invoice.   Our billing staff will not be able to assist you with questions regarding bills from these companies.  You will be contacted with the lab results as soon as they are available. The fastest way to get your results is to activate your My Chart account. Instructions are located on the last page of this paperwork. If you have not heard from [place] regarding the results in 2 weeks, please contact this office.

## 2017-11-27 NOTE — Progress Notes (Signed)
Subjective:  By signing my name below, I, [name], attest that this documentation has been prepared under the direction and in the presence of [name], MD Electronically Signed: [name], ED Scribe 11/27/2017 at 9:50 AM.   Patient ID: William Oneill, male    DOB: November 14, 1945, 72 y.o.   MRN: [id number]  Chief Complaint  Patient presents with  . Insomnia    F/u   . Asthma    f/u   HPI William Oneill is a 72 y.o. male who presents to Primary Care at [facility] for f/u. Last seen 4/11 for multiple concerns.  Insomnia Belsomra and Ambien in the past. Tried hydroxyzine due to difficulty with sleep onset. - Pt reports hydroxyzine has been helping with sleep. Started with 1/2 tab but tried full tab the next night and has been sleeping fine since.  Heme Positive Stool with Anemia See previous notes. Started on PPI at ER. Referred to GI. Referred to [facility]. -  Cough has improved. Denies appetite change, abdominal pain, blood in stools, melena, lightheadedness, dizziness. He has not received a call from GI yet.  Patient Active Problem List   Diagnosis Date Noted  . Generalized weakness 04/11/2015  . Acute purulent bronchitis 04/11/2015  . Dehydration 04/11/2015  . Nocturia more than twice per night 01/16/2015  . Snoring 01/16/2015  . 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 Obese abdomen 01/16/2015  . Other fatigue 01/16/2015  . Type 2 diabetes mellitus without complication ([place]) 83/33/8329  . Insomnia    Past Medical History:  Diagnosis Date  . Diabetes mellitus without complication ([place])   . High cholesterol   . Insomnia    No past surgical history on file. No Known Allergies Prior to Admission medications   Medication Sig Start Date End Date Taking? Authorizing Provider  aspirin EC 81 MG tablet Take 81 mg by mouth daily.    [provider]  atorvastatin (LIPITOR) 10 MG tablet Take 1 tablet (10 mg total) by mouth daily.  11/12/17   [name], MD  benzonatate (TESSALON) 100 MG capsule Take 1 capsule (100 mg total) by mouth 2 (two) times daily as needed for cough. 11/06/17   Tereasa Coop, PA-C  blood glucose meter kit and supplies Dispense based on patient and insurance preference. Use up to four times daily as directed. (FOR ICD-9 250.00, 250.01). 12/11/14   [name], MD  glipiZIDE (GLUCOTROL) 5 MG tablet Take 1 tablet (5 mg total) by mouth 2 (two) times daily before a meal. 11/12/17   [name], MD  hydrOXYzine (ATARAX/VISTARIL) 25 MG tablet Take 0.5-1 tablets (12.5-25 mg total) by mouth at bedtime as needed for anxiety. 11/12/17   [name], MD  ipratropium (ATROVENT) 0.03 % nasal spray Place 2 sprays into both nostrils 2 (two) times daily. 10/21/17   Ivar Drape D, PA  metFORMIN (GLUCOPHAGE) 1000 MG tablet Take 1 tablet (1,000 mg total) by mouth 2 (two) times daily with a meal. 11/12/17   [name], MD  Multiple Vitamin (MULTIVITAMIN WITH MINERALS) TABS tablet Take 1 tablet by mouth daily.    [provider]  omeprazole (PRILOSEC) 20 MG capsule Take 1 capsule (20 mg total) by mouth daily. 11/07/17   Hayden Rasmussen, MD  ONE TOUCH ULTRA TEST test strip USE AS DIRECTED TO TEST UP TO 4 TIMES A DAY 05/14/15   [name], MD  sitaGLIPtin (JANUVIA) 100 MG tablet  Take 1 tablet (100 mg total) by mouth daily. 11/12/17   [name], MD  Zoster Vaccine Live, PF, (ZOSTAVAX) 56979 UNT/0.65ML injection Inject 19,400 Units into the skin once. 01/10/16   [name], MD   Social History   Socioeconomic History  . Marital status: Married    Spouse name: Not on file  . Number of children: Not on file  . Years of education: Not on file  . Highest education level: Not on file  Occupational History  . Not on file  Social Needs  . Financial resource strain: Not on file  . Food insecurity:    Worry: Not on file    Inability: Not on file  . Transportation  needs:    Medical: Not on file    Non-medical: Not on file  Tobacco Use  . Smoking status: Never Smoker  . Smokeless tobacco: Never Used  Substance and Sexual Activity  . Alcohol use: No    Alcohol/week: 0.0 oz  . Drug use: No  . Sexual activity: Not on file  Lifestyle  . Physical activity:    Days per week: Not on file    Minutes per session: Not on file  . Stress: Not on file  Relationships  . Social connections:    Talks on phone: Not on file    Gets together: Not on file    Attends religious service: Not on file    Active member of club or organization: Not on file    Attends meetings of clubs or organizations: Not on file    Relationship status: Not on file  . Intimate partner violence:    Fear of current or ex partner: Not on file    Emotionally abused: Not on file    Physically abused: Not on file    Forced sexual activity: Not on file  Other Topics Concern  . Not on file  Social History Narrative  . Not on file   Review of Systems  Constitutional: Negative for appetite change.  Respiratory: Negative for cough (improved).   Gastrointestinal: Negative for abdominal pain and blood in stool.  Neurological: Negative for dizziness and light-headedness.  Psychiatric/Behavioral: Negative for sleep disturbance (improved).      Objective:   Physical Exam  Constitutional: He is oriented to person, place, and time. He appears well-developed and well-nourished. No distress.  HENT:  Head: Normocephalic and atraumatic.  Eyes: Conjunctivae and EOM are normal.  Neck: Neck supple. No tracheal deviation present.  Cardiovascular: Normal rate, regular rhythm and normal heart sounds. Exam reveals no gallop and no friction rub.  No murmur heard. Pulmonary/Chest: Effort normal and breath sounds normal. No respiratory distress.  Abdominal: Soft. He exhibits no distension. There is no tenderness.  Musculoskeletal: Normal range of motion. He exhibits no edema (in LEs).    Neurological: He is alert and oriented to person, place, and time.  Skin: Skin is warm and dry.  Psychiatric: He has a normal mood and affect. His behavior is normal.  Nursing note and vitals reviewed.  Vitals:   11/27/17 0901  BP: 138/60  Pulse: 74  Temp: 97.6 F (36.4 C)  TempSrc: Oral  SpO2: 98%  Weight: 179 lb 3.2 oz (81.3 kg)  Height: '5\' 6"'$  (1.676 m)      Assessment & Plan:  William Oneill is a 72 y.o. male Insomnia, unspecified type  - improved on hydroxyzine, and tolerating meds without new side effects.   Continue same dose.  Anemia, unspecified type  -Follow-up with gastroenterology as planned.  Asymptomatic at present.  No orders of the defined types were placed in this encounter.  Patient Instructions    I'm glad to hear that hydroxyzine is working ok. Ok to continue that medication as needed at night.   Continue omeprazole daily. [facility] should be calling you for an appointment to see the gastroenterologist.  If you have not heard from them by next week, please call them:  [facility]:  [address], [place], [place] [address]  Phone: [phone]   Return to the clinic or go to the nearest emergency room if any of your symptoms worsen or new symptoms occur.    IF you received an x-ray today, you will receive an invoice from [facility]. Please contact [facility] at [phone] with questions or concerns regarding your invoice.   IF you received labwork today, you will receive an invoice from [facility]. Please contact [facility] at [phone] with questions or concerns regarding your invoice.   Our billing staff will not be able to assist you with questions regarding bills from these companies.  You will be contacted with the lab results as soon as they are available. The fastest way to get your results is to activate your My Chart account. Instructions are located on the last page of this paperwork. If  you have not heard from [place] regarding the results in 2 weeks, please contact this office.       I personally performed the services described in this documentation, which was scribed in my presence. The recorded information has been reviewed and considered for accuracy and completeness, addended by me as needed, and agree with information above.  Signed,   [name], MD Primary Care at [facility].  11/27/17 9:58 AM

## 2017-12-07 ENCOUNTER — Telehealth: Payer: Self-pay | Admitting: Family Medicine

## 2017-12-07 ENCOUNTER — Other Ambulatory Visit: Payer: Self-pay

## 2017-12-07 DIAGNOSIS — E119 Type 2 diabetes mellitus without complications: Secondary | ICD-10-CM

## 2017-12-07 MED ORDER — GLUCOSE BLOOD VI STRP: ORAL_STRIP | 6 refills | Status: DC

## 2017-12-07 NOTE — Telephone Encounter (Signed)
Patient presents in clinic for refill on test strips- his are expired. He needs One Touch Ultra test strips sent to [facility] in [place]. Test strips sent per patient request.

## 2017-12-17 ENCOUNTER — Other Ambulatory Visit: Payer: Self-pay | Admitting: Gastroenterology

## 2017-12-17 ENCOUNTER — Encounter: Payer: Self-pay | Admitting: Family Medicine

## 2017-12-17 ENCOUNTER — Ambulatory Visit
Admission: RE | Admit: 2017-12-17 | Discharge: 2017-12-17 | Disposition: A | Discharge status: Home / Self Care | Payer: Medicare Other | Source: Ambulatory Visit | Attending: Gastroenterology | Admitting: Gastroenterology

## 2017-12-17 DIAGNOSIS — R933 Abnormal findings on diagnostic imaging of other parts of digestive tract: Secondary | ICD-10-CM

## 2017-12-17 MED ORDER — IOPAMIDOL (ISOVUE-300) INJECTION 61%
100.0000 mL | Freq: Once | INTRAVENOUS | Status: AC | PRN
  Administered 2017-12-17: 100 mL via INTRAVENOUS

## 2017-12-21 ENCOUNTER — Telehealth: Payer: Self-pay

## 2017-12-21 NOTE — Telephone Encounter (Signed)
Called and left VM on patient's cell humber requesting that he call back to discuss need for oncology care.

## 2017-12-22 NOTE — Progress Notes (Signed)
Patient returned my phone call. I introduced myself and the role of GI navigator and let him know that he will be receiving a call from [facility] for an appointment to meet with a surgeon. I explained that we will get him in to see Dr. Benay Spice after he has his surgery.

## 2017-12-27 ENCOUNTER — Emergency Department (HOSPITAL_COMMUNITY)
Admission: EM | Admit: 2017-12-27 | Discharge: 2017-12-27 | Disposition: A | Discharge status: Home / Self Care | Payer: Medicare Other | Attending: Emergency Medicine | Admitting: Emergency Medicine

## 2017-12-27 ENCOUNTER — Encounter (HOSPITAL_COMMUNITY): Payer: Self-pay | Admitting: Emergency Medicine

## 2017-12-27 DIAGNOSIS — B86 Scabies: Secondary | ICD-10-CM | POA: Insufficient documentation

## 2017-12-27 DIAGNOSIS — E119 Type 2 diabetes mellitus without complications: Secondary | ICD-10-CM | POA: Diagnosis not present

## 2017-12-27 DIAGNOSIS — Z7984 Long term (current) use of oral hypoglycemic drugs: Secondary | ICD-10-CM | POA: Diagnosis not present

## 2017-12-27 DIAGNOSIS — R21 Rash and other nonspecific skin eruption: Secondary | ICD-10-CM | POA: Diagnosis present

## 2017-12-27 DIAGNOSIS — Z79899 Other long term (current) drug therapy: Secondary | ICD-10-CM | POA: Insufficient documentation

## 2017-12-27 DIAGNOSIS — Z7982 Long term (current) use of aspirin: Secondary | ICD-10-CM | POA: Diagnosis not present

## 2017-12-27 MED ORDER — DIPHENHYDRAMINE HCL 25 MG PO CAPS
25.0000 mg | ORAL_CAPSULE | Freq: Once | ORAL | Status: AC
Start: 2017-12-27 — End: 2017-12-27
  Administered 2017-12-27: 25 mg via ORAL
  Filled 2017-12-27: qty 1

## 2017-12-27 MED ORDER — PERMETHRIN 5 % EX CREA: TOPICAL_CREAM | CUTANEOUS | 0 refills | Status: DC

## 2017-12-27 NOTE — ED Notes (Signed)
Bed: WA03 Expected date:  Expected time:  Means of arrival:  Comments: T8

## 2017-12-27 NOTE — ED Provider Notes (Signed)
[facility] DEPT Provider Note   CSN: [id number] Arrival date & time: 12/27/17  1341     History   Chief Complaint Chief Complaint  Patient presents with  . Rash    HPI William Oneill is a 72 y.o. male past medical history of diabetes, hyperlipidemia, insomnia who presents the emergency department today for rash.  Patient states that on Friday he started noticing a pruritic rash between the webspaces of his bilateral hands that was pruritic.  Since that time the rash has not spread and involves the patient's right upper leg, genitalia, as well as abdomen.  He states he has been applying lotion to this that has not been helping.  He notes the pruritus is worse at night. Denies fever, chills, contacts with persons with similar rash, or any changes in lotions/soaps/detergents. No exposure to animal or plant irritants. No new medications. No recent travel. No recent tick bites. No involvement to palms/soles.  No lip swelling, difficulty breathing, difficulty swallowing, abdominal cramping, vomiting or diarrhea.  Patient denies any urinary symptoms.  HPI  Past Medical History:  Diagnosis Date  . Diabetes mellitus without complication ([place])   . High cholesterol   . Insomnia     Patient Active Problem List   Diagnosis Date Noted  . Generalized weakness 04/11/2015  . Acute purulent bronchitis 04/11/2015  . Dehydration 04/11/2015  . Nocturia more than twice per night 01/16/2015  . Snoring 01/16/2015  . 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 Obese abdomen 01/16/2015  . Other fatigue 01/16/2015  . Type 2 diabetes mellitus without complication ([place]) 99/24/2683  . Insomnia     History reviewed. No pertinent surgical history.     Home Medications    Prior to Admission medications   Medication Sig Start Date End Date Taking? Authorizing Provider  aspirin EC 81 MG tablet Take 81 mg by mouth daily.   Yes [provider]    atorvastatin (LIPITOR) 10 MG tablet Take 1 tablet (10 mg total) by mouth daily. 11/12/17  Yes [name], MD  glipiZIDE (GLUCOTROL) 5 MG tablet Take 1 tablet (5 mg total) by mouth 2 (two) times daily before a meal. 11/12/17  Yes [name], MD  hydrOXYzine (ATARAX/VISTARIL) 25 MG tablet Take 0.5-1 tablets (12.5-25 mg total) by mouth at bedtime as needed for anxiety. 11/27/17  Yes [name], MD  metFORMIN (GLUCOPHAGE) 1000 MG tablet Take 1 tablet (1,000 mg total) by mouth 2 (two) times daily with a meal. 11/12/17  Yes [name], MD  Multiple Vitamin (MULTIVITAMIN WITH MINERALS) TABS tablet Take 1 tablet by mouth daily.   Yes [provider]  sitaGLIPtin (JANUVIA) 100 MG tablet Take 1 tablet (100 mg total) by mouth daily. 11/12/17  Yes [name], MD  benzonatate (TESSALON) 100 MG capsule Take 1 capsule (100 mg total) by mouth 2 (two) times daily as needed for cough. Patient not taking: Reported on 12/27/2017 11/06/17   Tereasa Coop, PA-C  blood glucose meter kit and supplies Dispense based on patient and insurance preference. Use up to four times daily as directed. (FOR ICD-9 250.00, 250.01). 12/11/14   [name], MD  glucose blood (ONE TOUCH ULTRA TEST) test strip USE AS DIRECTED TO TEST UP TO 4 TIMES A DAY 12/07/17   [name], MD  ipratropium (ATROVENT) 0.03 % nasal spray Place 2 sprays into both nostrils 2 (two) times daily. Patient not taking: Reported on 12/27/2017  10/21/17   Ivar Drape D, PA  omeprazole (PRILOSEC) 20 MG capsule Take 1 capsule (20 mg total) by mouth daily. Patient not taking: Reported on 12/27/2017 11/07/17   Hayden Rasmussen, MD  Zoster Vaccine Live, PF, (ZOSTAVAX) 74081 UNT/0.65ML injection Inject 19,400 Units into the skin once. 01/10/16   [name], MD    Family History Family History  Problem Relation Age of Onset  . Heart disease Father   . Heart attack Father   . Heart disease Sister   . Diabetes  Sister   . Stroke Mother     Social History Social History   Tobacco Use  . Smoking status: Never Smoker  . Smokeless tobacco: Never Used  Substance Use Topics  . Alcohol use: No    Alcohol/week: 0.0 oz  . Drug use: No     Allergies   Patient has no known allergies.   Review of Systems Review of Systems  All other systems reviewed and are negative.    Physical Exam Updated Vital Signs BP (!) 151/85   Pulse 71   Temp (!) 97.5 F (36.4 C) (Oral)   Resp 18   Ht '5\' 7"'$  (1.702 m)   Wt 81.2 kg (179 lb)   SpO2 100%   BMI 28.04 kg/m   Physical Exam  Constitutional: He appears well-developed and well-nourished.  No acute distress  HENT:  Head: Normocephalic and atraumatic.  Right Ear: External ear normal.  Left Ear: External ear normal.  Nose: Nose normal.  Mouth/Throat: Uvula is midline, oropharynx is clear and moist and mucous membranes are normal. No tonsillar exudate.  No rashes or lesions involving the oral mucosa.  No lip swelling, tongue swelling or uvular swelling.  Tongue protrusion is normal.  Uvula is midline.  Normal phonation.  Patient is in control of secretions.  No trismus   Eyes: Pupils are equal, round, and reactive to light. Right eye exhibits no discharge. Left eye exhibits no discharge. No scleral icterus.  Neck: Trachea normal. Neck supple. No spinous process tenderness present. No neck rigidity. Normal range of motion present.  No stridor  Cardiovascular: Normal rate, regular rhythm and intact distal pulses.  No murmur heard. Pulses:      Radial pulses are 2+ on the right side, and 2+ on the left side.       Dorsalis pedis pulses are 2+ on the right side, and 2+ on the left side.       Posterior tibial pulses are 2+ on the right side, and 2+ on the left side.  Pulmonary/Chest: Effort normal and breath sounds normal. He exhibits no tenderness.  No increased work of breathing. No accessory muscle use. Patient is sitting upright, speaking in full  sentences without difficulty   Abdominal: Soft. Bowel sounds are normal. There is no tenderness. There is no rebound and no guarding.  Musculoskeletal: He exhibits no edema.  Lymphadenopathy:    He has no cervical adenopathy.  Neurological: He is alert.  Skin: Skin is warm and dry. No rash noted. He is not diaphoretic.  Chaperone present during exam. Burrows between the patient's right third webspace.  Burrows between the patient's second and fourth webspace of the left hand.  No rash to the palms or soles.  Patient with excoriations of the right upper thigh without evidence of superimposed infection.  Patient does have similar lesions on the shaft of the penis. No vesicles concerning for herpes or chancre suggestive of syphillis, no pain with palpation,  no discharge or urethritis noted, scrotum and testicles w/o erythema or swelling, NTTP. No blisters, no pustules, no warmth, no draining sinus tracts, no superficial abscesses, no bullous impetigo, no vesicles, no desquamation, no target lesions with dusky purpura or a central bulla. Not tender to touch.  Psychiatric: He has a normal mood and affect.  Nursing note and vitals reviewed.    ED Treatments / Results  Labs (all labs ordered are listed, but only abnormal results are displayed) Labs Reviewed - No data to display  EKG None  Radiology No results found.  Procedures Procedures (including critical care time)  Medications Ordered in ED Medications - No data to display   Initial Impression / Assessment and Plan / ED Course  I have reviewed the triage vital signs and the nursing notes.  Pertinent labs & imaging results that were available during my care of the patient were reviewed by me and considered in my medical decision making (see chart for details).     Rash consistent with scabies. Patient denies any difficulty breathing or swallowing.  Pt has a patent airway without stridor and is handling secretions without  difficulty; no angioedema. No blisters, no pustules, no warmth, no draining sinus tracts, no superficial abscesses, no bullous impetigo, no vesicles, no desquamation, no target lesions with dusky purpura or a central bulla. Not tender to touch. No concern for superimposed infection. No concern for SJS, TEN, TSS, tick borne illness, syphilis or other life-threatening condition. Discussed diagnosis & treatment of scabies with parents.  They have been advised to followup with her primary care doctor 2 weeks after treatment.  They have also been advised to clean entire household including washing sheets and using R.I.D. spray in the car and on sofa.   The use of permethrin cream was discussed as well, they were told to use cream from head to toe & leave on child for 8-12 hours.  They've been advised to repeat treatment if new eruptions occur. Specific return precautions discussed. Time was given for all questions to be answered. The patient verbalized understanding and agreement with plan. The patient appears safe for discharge home. Patient case seen and discussed with Dr. Gilford Raid who is in agreement with plan.   Final Clinical Impressions(s) / ED Diagnoses   Final diagnoses:  Scabies    ED Discharge Orders        Ordered    permethrin (ELIMITE) 5 % cream     12/27/17 1953       Lorelle Gibbs 12/27/17 Laurell Roof, MD 12/27/17 1958

## 2017-12-27 NOTE — ED Triage Notes (Signed)
Pt reports rash on hands, legs and in private areas that has gotten worse since Friday. Reports itching.

## 2017-12-27 NOTE — ED Notes (Signed)
ED Provider at bedside. 

## 2017-12-27 NOTE — Discharge Instructions (Addendum)
Please read and follow all provided instructions.  Your diagnoses today include: Scabies Scabies is a skin condition that happens when very small insects get under the skin (infestation). This causes a rash and severe itchiness. Scabies can spread from person to person (is contagious). If you get scabies, it is common for others in your household to get scabies too. With proper treatment, symptoms usually go away in 2-4 weeks. Scabies usually does not cause lasting problems. Medications prescribed:  Permethrin cream - Apply to entire body before bed and wash off in morning, repeat in one week if not resolved.  Home care instructions:  Follow any educational materials contained in this packet.  You can use benadryl as directed on packaging for itching. You may also use Loradine (Claritin) as directed as this medication will not make you very sleepy.   Do not use hydrocortisone or any other type of steroid cream -- it will not kill the scabies parasite and will make the rash worse.   You need to disinfect surfaces, vacuum floors, and wash all clothes and bedding in hot water.   Follow-up instructions: Please follow-up with your primary care provider as needed for further evaluation of your symptoms.  Return instructions:  Please return to the Emergency Department if you experience worsening symptoms.  You develop any lip swelling, tongue swelling, difficulty swallowing, shortness of breath Your symptoms do not resolve If you develop worsening or new concerning symptoms you can return to the emergency department for re-evaluation.  Please return if you have any other emergent concerns.  Additional Information:  Your vital signs today were: BP (!) 151/85    Pulse 64    Temp (!) 97.5 F (36.4 C) (Oral)    Resp 16    Ht 5\' 7"  (1.702 m)    Wt 81.2 kg (179 lb)    SpO2 100%    BMI 28.04 kg/m  If your blood pressure (BP) was elevated above 135/85 this visit, please have this repeated by your  doctor within one month.

## 2018-01-09 ENCOUNTER — Encounter (HOSPITAL_COMMUNITY): Payer: Self-pay | Admitting: Emergency Medicine

## 2018-01-09 ENCOUNTER — Emergency Department (HOSPITAL_COMMUNITY)
Admission: EM | Admit: 2018-01-09 | Discharge: 2018-01-09 | Disposition: A | Discharge status: Home / Self Care | Payer: Medicare Other | Attending: Emergency Medicine | Admitting: Emergency Medicine

## 2018-01-09 DIAGNOSIS — Z7984 Long term (current) use of oral hypoglycemic drugs: Secondary | ICD-10-CM | POA: Diagnosis not present

## 2018-01-09 DIAGNOSIS — Z79899 Other long term (current) drug therapy: Secondary | ICD-10-CM | POA: Insufficient documentation

## 2018-01-09 DIAGNOSIS — Z7982 Long term (current) use of aspirin: Secondary | ICD-10-CM | POA: Insufficient documentation

## 2018-01-09 DIAGNOSIS — B86 Scabies: Secondary | ICD-10-CM | POA: Insufficient documentation

## 2018-01-09 DIAGNOSIS — E119 Type 2 diabetes mellitus without complications: Secondary | ICD-10-CM | POA: Insufficient documentation

## 2018-01-09 DIAGNOSIS — R21 Rash and other nonspecific skin eruption: Secondary | ICD-10-CM | POA: Diagnosis present

## 2018-01-09 DIAGNOSIS — E7800 Pure hypercholesterolemia, unspecified: Secondary | ICD-10-CM | POA: Diagnosis not present

## 2018-01-09 HISTORY — DX: Malignant (primary) neoplasm, unspecified: C80.1

## 2018-01-09 HISTORY — DX: Malignant neoplasm of colon, unspecified: C18.9

## 2018-01-09 MED ORDER — PERMETHRIN 5 % EX CREA: TOPICAL_CREAM | CUTANEOUS | 1 refills | Status: DC

## 2018-01-09 NOTE — ED Provider Notes (Signed)
[facility] EMERGENCY DEPARTMENT Provider Note   CSN: [id number] Arrival date & time: 01/09/18  0227     History   Chief Complaint Chief Complaint  Patient presents with  . Rash    HPI William Oneill is a 72 y.o. male.  72 year old male presents for reevaluation of rash.  Patient was seen last week (5/26) for same complaint.  At that time he was diagnosed with scabies.  Patient reports that he has used the entire tube of Elimite cream at home with mild improvement in his symptoms.  Patient reports that his rash has failed to completely resolve so he decided to return to the ED for reevaluation.  Patient denies any current fever, nausea, vomiting, or other acute complaint. Rash remains mostly confined to his hands, distal upper extremities, groin, and trunk.   The history is provided by the patient and medical records.  Rash   This is a recurrent problem. The current episode started more than 1 week ago. The problem has been gradually improving. There has been no fever. The rash is present on the trunk, torso, right hand, left hand and groin. The patient is experiencing no pain. Pain course: constant itching. Associated symptoms include itching. Pertinent negatives include no blisters and no pain. The treatment provided mild relief.    Past Medical History:  Diagnosis Date  . Cancer ([place])   . Colon cancer ([place])   . Diabetes mellitus without complication ([place])   . High cholesterol   . Insomnia     Patient Active Problem List   Diagnosis Date Noted  . Generalized weakness 04/11/2015  . Acute purulent bronchitis 04/11/2015  . Dehydration 04/11/2015  . Nocturia more than twice per night 01/16/2015  . Snoring 01/16/2015  . 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 Obese abdomen 01/16/2015  . Other fatigue 01/16/2015  . Type 2 diabetes mellitus without complication ([place]) 72/53/6644  . Insomnia     Past Surgical History:  Procedure Laterality  Date  . COLONOSCOPY          Home Medications    Prior to Admission medications   Medication Sig Start Date End Date Taking? Authorizing Provider  aspirin EC 81 MG tablet Take 81 mg by mouth daily.    [provider]  atorvastatin (LIPITOR) 10 MG tablet Take 1 tablet (10 mg total) by mouth daily. 11/12/17   [name], MD  benzonatate (TESSALON) 100 MG capsule Take 1 capsule (100 mg total) by mouth 2 (two) times daily as needed for cough. Patient not taking: Reported on 12/27/2017 11/06/17   Tereasa Coop, PA-C  blood glucose meter kit and supplies Dispense based on patient and insurance preference. Use up to four times daily as directed. (FOR ICD-9 250.00, 250.01). 12/11/14   [name], MD  glipiZIDE (GLUCOTROL) 5 MG tablet Take 1 tablet (5 mg total) by mouth 2 (two) times daily before a meal. 11/12/17   [name], MD  glucose blood (ONE TOUCH ULTRA TEST) test strip USE AS DIRECTED TO TEST UP TO 4 TIMES A DAY 12/07/17   [name], MD  hydrOXYzine (ATARAX/VISTARIL) 25 MG tablet Take 0.5-1 tablets (12.5-25 mg total) by mouth at bedtime as needed for anxiety. 11/27/17   [name], MD  ipratropium (ATROVENT) 0.03 % nasal spray Place 2 sprays into both nostrils 2 (two) times daily. Patient not taking: Reported on 12/27/2017 10/21/17   Ivar Drape D, PA  metFORMIN (GLUCOPHAGE)  1000 MG tablet Take 1 tablet (1,000 mg total) by mouth 2 (two) times daily with a meal. 11/12/17   [name], MD  Multiple Vitamin (MULTIVITAMIN WITH MINERALS) TABS tablet Take 1 tablet by mouth daily.    [provider]  omeprazole (PRILOSEC) 20 MG capsule Take 1 capsule (20 mg total) by mouth daily. Patient not taking: Reported on 12/27/2017 11/07/17   Hayden Rasmussen, MD  permethrin (ELIMITE) 5 % cream Apply cream from head to feet, leave on for 8 to 14 hours, then wash with soap/water, repeat application if living mites present 14 days after initial treatment  12/27/17   Maczis, Barth Kirks, PA-C  permethrin (ELIMITE) 5 % cream Apply to affected area once 01/09/18   Valarie Merino, MD  sitaGLIPtin (JANUVIA) 100 MG tablet Take 1 tablet (100 mg total) by mouth daily. 11/12/17   [name], MD  Zoster Vaccine Live, PF, (ZOSTAVAX) 24580 UNT/0.65ML injection Inject 19,400 Units into the skin once. 01/10/16   [name], MD    Family History Family History  Problem Relation Age of Onset  . Heart disease Father   . Heart attack Father   . Heart disease Sister   . Diabetes Sister   . Stroke Mother     Social History Social History   Tobacco Use  . Smoking status: Never Smoker  . Smokeless tobacco: Never Used  Substance Use Topics  . Alcohol use: No    Alcohol/week: 0.0 oz  . Drug use: No     Allergies   Patient has no known allergies.   Review of Systems Review of Systems  Skin: Positive for itching and rash.  All other systems reviewed and are negative.    Physical Exam Updated Vital Signs BP (!) 151/67 (BP Location: Right Arm)   Pulse 65   Temp 98.2 F (36.8 C) (Oral)   Resp 18   SpO2 100%   Physical Exam  Constitutional: He is oriented to person, place, and time. He appears well-developed and well-nourished. No distress.  HENT:  Head: Normocephalic and atraumatic.  Mouth/Throat: Oropharynx is clear and moist.  Eyes: Pupils are equal, round, and reactive to light. Conjunctivae and EOM are normal.  Neck: Normal range of motion. Neck supple.  Cardiovascular: Normal rate, regular rhythm and normal heart sounds.  Pulmonary/Chest: Effort normal and breath sounds normal. No respiratory distress.  Abdominal: Soft. He exhibits no distension. There is no tenderness.  Musculoskeletal: Normal range of motion. He exhibits no edema or deformity.  Neurological: He is alert and oriented to person, place, and time.  Skin: Skin is warm and dry.  Diffuse mildly erythematous rash consistent with scabies -localized to the  webspaces of both hands, distal upper extremities, trunk and groin.,   No evidence of overlying cellulitis.  No evidence of other more concerning pathology.  Psychiatric: He has a normal mood and affect.  Nursing note and vitals reviewed.    ED Treatments / Results  Labs (all labs ordered are listed, but only abnormal results are displayed) Labs Reviewed - No data to display  EKG None  Radiology No results found.  Procedures Procedures (including critical care time)  Medications Ordered in ED Medications - No data to display   Initial Impression / Assessment and Plan / ED Course  I have reviewed the triage vital signs and the nursing notes.  Pertinent labs & imaging results that were available during my care of the patient were reviewed by me  and considered in my medical decision making (see chart for details).     MDM  Screen complete  Patient is presenting for reevaluation of his rash.  Patient was recently seen for same and diagnosed with scabies.  Patient reports that he has used Elimite at home x1 with some improvement in his symptoms.  Patient does have an established dermatology provider - whom he has not yet contacted.  Patient does appear to be in no acute distress.  He does not appear to have any indication for further testing or workup in the ED.  I will prescribe an additional round of Elimite for home use.  Patient is instructed that if his symptoms fail to improve he absolutely should follow-up with his established dermatologist.  Close follow-up is advised.  Strict return precautions given and understood.  Final Clinical Impressions(s) / ED Diagnoses   Final diagnoses:  Rash  Scabies    ED Discharge Orders        Ordered    permethrin (ELIMITE) 5 % cream     01/09/18 0729       Valarie Merino, MD 01/09/18 [phone]

## 2018-01-09 NOTE — ED Triage Notes (Signed)
Patient reports generalized itchy skin rashes onset last week , respirations unlabored / no oral swelling .

## 2018-01-09 NOTE — Discharge Instructions (Signed)
Please return for any problem. Please Follow up with your regular physician and regular dermatologist as instructed.

## 2018-01-11 ENCOUNTER — Telehealth: Payer: Self-pay | Admitting: Family Medicine

## 2018-01-11 NOTE — Telephone Encounter (Signed)
Dr. Benson Norway conveyed the results of the biopsies to pt's sister after failure to reach him on any of his numbers.  She will relay the referral information that was sent to the cancer coordinator

## 2018-01-25 ENCOUNTER — Other Ambulatory Visit: Payer: Self-pay | Admitting: General Surgery

## 2018-01-25 NOTE — Patient Instructions (Signed)
OSIEL STICK  01/25/2018   Your procedure is scheduled on: 02-02-18   Report to [facility] Main  Entrance   Report to admitting at        100 PM    Call this number if you have problems the morning of surgery [phone]   Remember:   FOLLOW YOUR BOWEL PREP PER YOUR MD ORDER. FOLLOW A CLEAR LIQUID DIET THE DAY OF YOUR BOWEL PREP              DRINK 2 PRESURGERY ENSURE DRINKS THE NIGHT BEFORE SURGERY AT  1000 PM AND 1 PRESURGERY DRINK THE DAY OF THE PROCEDURE 3 HOURS PRIOR TO SCHEDULED SURGERY. NO SOLIDS AFTER MIDNIGHT THE DAY PRIOR TO THE SURGERY. NOTHING BY MOUTH EXCEPT CLEAR LIQUIDS UNTIL THREE HOURS PRIOR TO SCHEDULED SURGERY. PLEASE FINISH PRESURGERY ENSURE DRINK PER SURGEON ORDER 3 HOURS PRIOR TO SCHEDULED SURGERY TIME WHICH NEEDS TO BE COMPLETED AT ___1200 NOON______.    CLEAR LIQUID DIET   Foods Allowed                                                                     Foods Excluded  Coffee and tea, regular and decaf                             liquids that you cannot  Plain Jell-O in any flavor                                             see through such as: Fruit ices (not with fruit pulp)                                     milk, soups, orange juice  Iced Popsicles                                    All solid food Carbonated beverages, regular and diet                                    Cranberry, grape and apple juices Sports drinks like Gatorade Lightly seasoned clear broth or consume(fat free) Sugar, honey syrup  Sample Menu Breakfast                                Lunch                                     Supper Cranberry juice                    Beef broth  Chicken broth Jell-O                                     Grape juice                           Apple juice Coffee or tea                        Jell-O                                      Popsicle                                                Coffee or tea                         Coffee or tea  _____________________________________________________________________    Take these medicines the morning of surgery with A SIP OF WATER: lipitor  Day before surgery only take morning and /or lunch dose of glipizide   DO NOT TAKE ANY DIABETIC MEDICATIONS DAY OF YOUR SURGERY                               You may not have any metal on your body including hair pins and              piercings  Do not wear jewelry,  lotions, powders or perfumes, deodorant                        Men may shave face and neck.   Do not bring valuables to the hospital. [place].  Contacts, dentures or bridgework may not be worn into surgery.  Leave suitcase in the car. After surgery it may be brought to your room.                Please read over the following fact sheets you were given: _____________________________________________________________________           [facility] - Preparing for Surgery Before surgery, you can play an important role.  Because skin is not sterile, your skin needs to be as free of germs as possible.  You can reduce the number of germs on your skin by washing with CHG (chlorahexidine gluconate) soap before surgery.  CHG is an antiseptic cleaner which kills germs and bonds with the skin to continue killing germs even after washing. Please DO NOT use if you have an allergy to CHG or antibacterial soaps.  If your skin becomes reddened/irritated stop using the CHG and inform your nurse when you arrive at Short Stay. Do not shave (including legs and underarms) for at least 48 hours prior to the first CHG shower.  You may shave your face/neck. Please follow these instructions carefully:  1.  Shower with CHG Soap the night before surgery and the  morning of Surgery.  2.  If you  choose to wash your hair, wash your hair first as usual with your  normal  shampoo.  3.  After you shampoo, rinse your hair and  body thoroughly to remove the  shampoo.                           4.  Use CHG as you would any other liquid soap.  You can apply chg directly  to the skin and wash                       Gently with a scrungie or clean washcloth.  5.  Apply the CHG Soap to your body ONLY FROM THE NECK DOWN.   Do not use on face/ open                           Wound or open sores. Avoid contact with eyes, ears mouth and genitals (private parts).                       Wash face,  Genitals (private parts) with your normal soap.             6.  Wash thoroughly, paying special attention to the area where your surgery  will be performed.  7.  Thoroughly rinse your body with warm water from the neck down.  8.  DO NOT shower/wash with your normal soap after using and rinsing off  the CHG Soap.                9.  Pat yourself dry with a clean towel.            10.  Wear clean pajamas.            11.  Place clean sheets on your bed the night of your first shower and do not  sleep with pets. Day of Surgery : Do not apply any lotions/deodorants the morning of surgery.  Please wear clean clothes to the hospital/surgery center.  FAILURE TO FOLLOW THESE INSTRUCTIONS MAY RESULT IN THE CANCELLATION OF YOUR SURGERY PATIENT SIGNATURE_________________________________  NURSE SIGNATURE__________________________________  ________________________________________________________________________  WHAT IS A BLOOD TRANSFUSION? Blood Transfusion Information  A transfusion is the replacement of blood or some of its parts. Blood is made up of multiple cells which provide different functions.  Red blood cells carry oxygen and are used for blood loss replacement.  White blood cells fight against infection.  Platelets control bleeding.  Plasma helps clot blood.  Other blood products are available for specialized needs, such as hemophilia or other clotting disorders. BEFORE THE TRANSFUSION  Who gives blood for transfusions?    Healthy volunteers who are fully evaluated to make sure their blood is safe. This is blood bank blood. Transfusion therapy is the safest it has ever been in the practice of medicine. Before blood is taken from a donor, a complete history is taken to make sure that person has no history of diseases nor engages in risky social behavior (examples are intravenous drug use or sexual activity with multiple partners). The donor's travel history is screened to minimize risk of transmitting infections, such as malaria. The donated blood is tested for signs of infectious diseases, such as HIV and hepatitis. The blood is then tested to be sure it is compatible with you in order to minimize the chance of a transfusion reaction. If  you or a relative donates blood, this is often done in anticipation of surgery and is not appropriate for emergency situations. It takes many days to process the donated blood. RISKS AND COMPLICATIONS Although transfusion therapy is very safe and saves many lives, the main dangers of transfusion include:   Getting an infectious disease.  Developing a transfusion reaction. This is an allergic reaction to something in the blood you were given. Every precaution is taken to prevent this. The decision to have a blood transfusion has been considered carefully by your caregiver before blood is given. Blood is not given unless the benefits outweigh the risks. AFTER THE TRANSFUSION  Right after receiving a blood transfusion, you will usually feel much better and more energetic. This is especially true if your red blood cells have gotten low (anemic). The transfusion raises the level of the red blood cells which carry oxygen, and this usually causes an energy increase.  The nurse administering the transfusion will monitor you carefully for complications. HOME CARE INSTRUCTIONS  No special instructions are needed after a transfusion. You may find your energy is better. Speak with your  caregiver about any limitations on activity for underlying diseases you may have. SEEK MEDICAL CARE IF:   Your condition is not improving after your transfusion.  You develop redness or irritation at the intravenous (IV) site. SEEK IMMEDIATE MEDICAL CARE IF:  Any of the following symptoms occur over the next 12 hours:  Shaking chills.  You have a temperature by mouth above 102 F (38.9 C), not controlled by medicine.  Chest, back, or muscle pain.  People around you feel you are not acting correctly or are confused.  Shortness of breath or difficulty breathing.  Dizziness and fainting.  You get a rash or develop hives.  You have a decrease in urine output.  Your urine turns a dark color or changes to pink, red, or brown. Any of the following symptoms occur over the next 10 days:  You have a temperature by mouth above 102 F (38.9 C), not controlled by medicine.  Shortness of breath.  Weakness after normal activity.  The white part of the eye turns yellow (jaundice).  You have a decrease in the amount of urine or are urinating less often.  Your urine turns a dark color or changes to pink, red, or brown. Document Released: 07/18/2000 Document Revised: 10/13/2011 Document Reviewed: 03/06/2008 ExitCare Patient Information 2014 [place].  _______________________________________________________________________  Incentive Spirometer  An incentive spirometer is a tool that can help keep your lungs clear and active. This tool measures how well you are filling your lungs with each breath. Taking long deep breaths may help reverse or decrease the chance of developing breathing (pulmonary) problems (especially infection) following:  A long period of time when you are unable to move or be active. BEFORE THE PROCEDURE   If the spirometer includes an indicator to show your best effort, your nurse or respiratory therapist will set it to a desired goal.  If possible, sit  up straight or lean slightly forward. Try not to slouch.  Hold the incentive spirometer in an upright position. INSTRUCTIONS FOR USE  1. Sit on the edge of your bed if possible, or sit up as far as you can in bed or on a chair. 2. Hold the incentive spirometer in an upright position. 3. Breathe out normally. 4. Place the mouthpiece in your mouth and seal your lips tightly around it. 5. Breathe in slowly and as deeply  as possible, raising the piston or the ball toward the top of the column. 6. Hold your breath for 3-5 seconds or for as long as possible. Allow the piston or ball to fall to the bottom of the column. 7. Remove the mouthpiece from your mouth and breathe out normally. 8. Rest for a few seconds and repeat Steps 1 through 7 at least 10 times every 1-2 hours when you are awake. Take your time and take a few normal breaths between deep breaths. 9. The spirometer may include an indicator to show your best effort. Use the indicator as a goal to work toward during each repetition. 10. After each set of 10 deep breaths, practice coughing to be sure your lungs are clear. If you have an incision (the cut made at the time of surgery), support your incision when coughing by placing a pillow or rolled up towels firmly against it. Once you are able to get out of bed, walk around indoors and cough well. You may stop using the incentive spirometer when instructed by your caregiver.  RISKS AND COMPLICATIONS  Take your time so you do not get dizzy or light-headed.  If you are in pain, you may need to take or ask for pain medication before doing incentive spirometry. It is harder to take a deep breath if you are having pain. AFTER USE  Rest and breathe slowly and easily.  It can be helpful to keep track of a log of your progress. Your caregiver can provide you with a simple table to help with this. If you are using the spirometer at home, follow these instructions: [place] IF:   You are  having difficultly using the spirometer.  You have trouble using the spirometer as often as instructed.  Your pain medication is not giving enough relief while using the spirometer.  You develop fever of 100.5 F (38.1 C) or higher. SEEK IMMEDIATE MEDICAL CARE IF:   You cough up bloody sputum that had not been present before.  You develop fever of 102 F (38.9 C) or greater.  You develop worsening pain at or near the incision site. MAKE SURE YOU:   Understand these instructions.  Will watch your condition.  Will get help right away if you are not doing well or get worse. Document Released: 12/01/2006 Document Revised: 10/13/2011 Document Reviewed: 02/01/2007 [facility] Patient Information 2014 [place], [place].   ________________________________________________________________________

## 2018-01-26 ENCOUNTER — Other Ambulatory Visit: Payer: Self-pay

## 2018-01-26 ENCOUNTER — Encounter (HOSPITAL_COMMUNITY)
Admission: RE | Admit: 2018-01-26 | Discharge: 2018-01-26 | Disposition: A | Discharge status: Home / Self Care | Payer: Medicare Other | Source: Ambulatory Visit | Attending: General Surgery | Admitting: General Surgery

## 2018-01-26 ENCOUNTER — Encounter (HOSPITAL_COMMUNITY): Payer: Self-pay

## 2018-01-26 DIAGNOSIS — Z01812 Encounter for preprocedural laboratory examination: Secondary | ICD-10-CM | POA: Insufficient documentation

## 2018-01-26 HISTORY — DX: Pneumonia, unspecified organism: J18.9

## 2018-01-26 HISTORY — DX: Personal history of urinary calculi: Z87.442

## 2018-01-26 LAB — COMPREHENSIVE METABOLIC PANEL
ALT: 15 U/L (ref 0–44)
AST: 16 U/L (ref 15–41)
Albumin: 3.7 g/dL (ref 3.5–5.0)
Alkaline Phosphatase: 66 U/L (ref 38–126)
Anion gap: 3 — ABNORMAL LOW (ref 5–15)
BUN: 19 mg/dL (ref 8–23)
CHLORIDE: 109 mmol/L (ref 98–111)
CO2: 28 mmol/L (ref 22–32)
CREATININE: 0.88 mg/dL (ref 0.61–1.24)
Calcium: 8.7 mg/dL — ABNORMAL LOW (ref 8.9–10.3)
Glucose, Bld: 255 mg/dL — ABNORMAL HIGH (ref 70–99)
Potassium: 4.7 mmol/L (ref 3.5–5.1)
SODIUM: 140 mmol/L (ref 135–145)
Total Bilirubin: 1.3 mg/dL — ABNORMAL HIGH (ref 0.3–1.2)
Total Protein: 6.8 g/dL (ref 6.5–8.1)

## 2018-01-26 LAB — CBC WITH DIFFERENTIAL/PLATELET
Basophils Absolute: 0.1 10*3/uL (ref 0.0–0.1)
Basophils Relative: 2 %
EOS ABS: 0.4 10*3/uL (ref 0.0–0.7)
EOS PCT: 6 %
HCT: 30.9 % — ABNORMAL LOW (ref 39.0–52.0)
Hemoglobin: 9.5 g/dL — ABNORMAL LOW (ref 13.0–17.0)
LYMPHS ABS: 1.6 10*3/uL (ref 0.7–4.0)
LYMPHS PCT: 24 %
MCH: 22.4 pg — AB (ref 26.0–34.0)
MCHC: 30.7 g/dL (ref 30.0–36.0)
MCV: 72.7 fL — AB (ref 78.0–100.0)
MONO ABS: 0.7 10*3/uL (ref 0.1–1.0)
Monocytes Relative: 11 %
Neutro Abs: 3.7 10*3/uL (ref 1.7–7.7)
Neutrophils Relative %: 57 %
PLATELETS: 360 10*3/uL (ref 150–400)
RBC: 4.25 MIL/uL (ref 4.22–5.81)
RDW: 15.4 % (ref 11.5–15.5)
WBC: 6.5 10*3/uL (ref 4.0–10.5)

## 2018-01-26 LAB — URINALYSIS, ROUTINE W REFLEX MICROSCOPIC
Bacteria, UA: NONE SEEN
Bilirubin Urine: NEGATIVE
Glucose, UA: >=500 mg/dL — AB
Hgb urine dipstick: NEGATIVE
KETONES UR: NEGATIVE mg/dL
LEUKOCYTES UA: NEGATIVE
Nitrite: NEGATIVE
PROTEIN: NEGATIVE mg/dL
Specific Gravity, Urine: 1.012 (ref 1.005–1.030)
pH: 7 (ref 5.0–8.0)

## 2018-01-26 LAB — HEMOGLOBIN A1C
HEMOGLOBIN A1C: 8.6 % — AB (ref 4.8–5.6)
MEAN PLASMA GLUCOSE: 200.12 mg/dL

## 2018-01-26 LAB — ABO/RH: ABO/RH(D): A POS

## 2018-01-26 NOTE — Progress Notes (Addendum)
Hgba1c, Cbc and ua done 01-26-18 routed to Dr. Barry Dienes via epic

## 2018-01-26 NOTE — Progress Notes (Signed)
Chart left for William Oneill for follow up.

## 2018-01-26 NOTE — Progress Notes (Signed)
ekg 11-09-17 epic  Ct chest 12-17-17 epic

## 2018-01-27 LAB — CEA: CEA1: 1.4 ng/mL (ref 0.0–4.7)

## 2018-02-01 MED ORDER — BUPIVACAINE LIPOSOME 1.3 % IJ SUSP
20.0000 mL | Freq: Once | INTRAMUSCULAR | Status: DC
  Filled 2018-02-01: qty 20

## 2018-02-01 NOTE — H&P (View-Only) (Signed)
William Oneill Documented: 01/25/2018 8:36 AM Location: [facility] Patient #: [id number] DOB: 10-03-1945 Single / Language: Cleophus Molt / Race: White Male   History of Present Illness William Klein MD; 01/25/2018 9:39 AM) The patient is a 72 year old male who presents with colorectal cancer. Patient is a 72 year old male who is referred by Dr. Benson Norway after a routine screening colonoscopy found a cecal cancer. The patient had colonoscopy on 12/17/2017 and was found to have an infiltrative sessile and ulcerated nonobstructing large mass in the cecum. This was noted to be in the cecal cap and was partially circumferential and 2 cm in length. It was not noted to be bleeding. There were 2 sessile polyps that were very close that were 4 to 5 mm that were not biopsied. 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 The pathology was performed at [facility]. Accession number is [id number].  Of note, he has had staging CT scans which were negative.   Patient denies any abdominal pain, change in bowel habits, bloody stools, or other symptoms. He denies bloating, nausea or vomiting. He has no known history of heart or lung disease other than bronchospasm occasionally with an upper respiratory infection.  He has no family history of cancer of which he is aware.   CT chest/abd/pelvis 12/17/2017 IMPRESSION: 1. Focal area of abnormal enhancement involving the ascending colon may represent mass discovered on recent colonoscopy. 2. No specific findings identified to suggest metastatic disease. 3. 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 Nonspecific. 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 4. Nonspecific 3 mm  right upper lobe lung nodule is noted. No follow-up needed if patient is low-risk. Non-contrast chest CT can be considered in 12 months if patient is high-risk. This recommendation follows the consensus statement: Guidelines for Management of Incidental Pulmonary Nodules Detected on CT Images: From the Fleischner Society 2017; Radiology 2017; 284:228-243. 5. 7 mm low-density structure in segment 8 of the liver is too small to characterize. 6. Aortic Atherosclerosis (ICD10-I70.0). 7. Gallstones.      Past Surgical History Sabino Gasser; 01/25/2018 8:37 AM) Colon Polyp Removal - Colonoscopy  Tonsillectomy   Allergies Sabino Gasser; 01/25/2018 8:37 AM) No Known Drug Allergies [01/25/2018]: Allergies Reconciled   Medication History Sabino Gasser; 01/25/2018 8:38 AM) Belsomra (15MG  Tablet, Oral) Active. Atorvastatin Calcium (10MG  Tablet, Oral) Active. GlipiZIDE (5MG  Tablet, Oral) Active. Ipratropium Bromide (0.03% Solution, Nasal) Active. Januvia (100MG  Tablet, Oral) Active. MetFORMIN HCl (1000MG  Tablet, Oral) Active. Omeprazole (20MG  Capsule DR, Oral) Active. OneTouch Ultra Blue (In Vitro) Active. Permethrin (5% Cream, External) Active. Medications Reconciled  Social History Sabino Gasser; 01/25/2018 8:37 AM) Caffeine use  Carbonated beverages, Coffee, Tea. No alcohol use  Tobacco use  Never smoker.  Family History Sabino Gasser; 01/25/2018 8:37 AM) Heart Disease  Father. Hypertension  Mother.  Other Problems Sabino Gasser; 01/25/2018 8:37 AM) Colon Cancer  Diabetes Mellitus     Review of Systems Sabino Gasser; 01/25/2018 8:37 AM) General Not Present- Appetite Loss, Chills, Fatigue, Fever, Night Sweats, Weight Gain and Weight Loss. Skin Present- Dryness. Not Present- Change in Wart/Mole, Hives, Jaundice, New Lesions, Non-Healing Wounds, Rash and Ulcer. HEENT Present- Wears glasses/contact lenses. Not Present- Earache, Hearing Loss, Hoarseness,  Nose Bleed, Oral Ulcers, Ringing in the Ears, Seasonal  Allergies, Sinus Pain, Sore Throat, Visual Disturbances and Yellow Eyes. Respiratory Not Present- Bloody sputum, Chronic Cough, Difficulty Breathing, Snoring and Wheezing. Gastrointestinal Present- Abdominal Pain. Not Present- Bloating, Bloody Stool, Change in Bowel Habits, Chronic diarrhea, Constipation, Difficulty Swallowing, Excessive gas, Gets full quickly at meals, Hemorrhoids, Indigestion, Nausea, Rectal Pain and Vomiting. Male Genitourinary Present- Nocturia. Not Present- Blood in Urine, Change in Urinary Stream, Frequency, Impotence, Painful Urination, Urgency and Urine Leakage. Endocrine Present- New Diabetes. Not Present- Cold Intolerance, Excessive Hunger, Hair Changes, Heat Intolerance and Hot flashes.  Vitals Sabino Gasser; 01/25/2018 8:39 AM) 01/25/2018 8:38 AM Weight: 185.13 lb Height: 67in Body Surface Area: 1.96 m Body Mass Index: 28.99 kg/m  Temp.: 98.18F(Oral)  Pulse: 64 (Regular)  BP: 132/82 (Sitting, Left Arm, Standard)       Physical Exam William Klein MD; 01/25/2018 9:40 AM) General Mental Status-Alert. General Appearance-Consistent with stated age. Hydration-Well hydrated. Voice-Normal.  Head and Neck Head-normocephalic, atraumatic with no lesions or palpable masses. Trachea-midline. Thyroid Gland Characteristics - normal size and consistency.  Eye Eyeball - Bilateral-Extraocular movements intact. Sclera/Conjunctiva - Bilateral-No scleral icterus.  Chest and Lung Exam Chest and lung exam reveals -quiet, even and easy respiratory effort with no use of accessory muscles and on auscultation, normal breath sounds, no adventitious sounds and normal vocal resonance. Inspection Chest Wall - Normal. Back - normal.  Cardiovascular Cardiovascular examination reveals -normal heart sounds, regular rate and rhythm with no murmurs and normal pedal pulses  bilaterally.  Abdomen Inspection Inspection of the abdomen reveals - No Hernias. Palpation/Percussion Palpation and Percussion of the abdomen reveal - Soft, Non Tender, No Rebound tenderness, No Rigidity (guarding) and No hepatosplenomegaly. Auscultation Auscultation of the abdomen reveals - Bowel sounds normal.  Neurologic Neurologic evaluation reveals -alert and oriented x 3 with no impairment of recent or remote memory. Mental Status-Normal.  Musculoskeletal Global Assessment -Note: no gross deformities.  Normal Exam - Left-Upper Extremity Strength Normal and Lower Extremity Strength Normal. Normal Exam - Right-Upper Extremity Strength Normal and Lower Extremity Strength Normal.  Lymphatic Head & Neck  General Head & Neck Lymphatics: Bilateral - Description - Normal. Axillary  General Axillary Region: Bilateral - Description - Normal. Tenderness - Non Tender. Femoral & Inguinal  Generalized Femoral & Inguinal Lymphatics: Bilateral - Description - No Generalized lymphadenopathy.    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 There is a very small indeterminate liver lesion. Depending on his lymph node status and CEA, this may need follow-up with imaging.  I will plan a laparoscopic ascending colectomy. I reviewed the procedure with the patient including location and size of incisions, time in the hospital, recovery, as well as risks and benefits. I discussed the potential for an ostomy if he were to have complications. I also discussed the risks as bleeding, infection, damage to adjacent structures, need for additional procedures or surgeries, heart or lung complications, hernia, and more.  The patient is retired and lives with his sister. He is in good shape and gets around and does some yard work. He has diabetes  that is well controlled and takes medication for high cholesterol. He is on a baby aspirin.  We will schedule this at the first mutually available opportunity. Current Plans Pt Education - flb right colectomy: discussed with patient and provided information. Pt Education - [facility] Colon Bowel Prep 2018 ERAS/Miralax/Antibiotics Started Neomycin Sulfate 500 MG  Oral Tablet, 2 (two) Tablet SEE NOTE, #6, 01/25/2018, No Refill. Local Order: TAKE TWO TABLETS AT 2 PM, 3 PM, AND 10 PM THE DAY PRIOR TO SURGERY Started Flagyl 500 MG Oral Tablet, 2 (two) Tablet SEE NOTE, #6, 01/25/2018, No Refill. Local Order: Take at 2pm, 3pm, and 10pm the day prior to your colon operation Schedule for Surgery LIVER LESION (K76.9) Impression: Indeterminate, 7 mm. See above. LESION OF LUNG (R91.1) Impression: 3 mm. Indeterminate, but patient is low risk. 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 He may need follow-up with them. If someone is available, I will asked him to look at this in the operating room. However, the potential for future issues is less important at this time than his colon cancer. I would not delay treatment of his colon cancer for work-up of a potential remnant.    Signed by William Klein, MD (01/25/2018 9:46 AM

## 2018-02-01 NOTE — H&P (Signed)
William Oneill Documented: 01/25/2018 8:36 AM Location: [facility] Patient #: [id number] DOB: 1945-11-30 Single / Language: William Oneill / Race: White Male   History of Present Illness William Klein MD; 01/25/2018 9:39 AM) The patient is a 72 year old male who presents with colorectal cancer. Patient is a 72 year old male who is referred by Dr. Benson Norway after a routine screening colonoscopy found a cecal cancer. The patient had colonoscopy on 12/17/2017 and was found to have an infiltrative sessile and ulcerated nonobstructing large mass in the cecum. This was noted to be in the cecal cap and was partially circumferential and 2 cm in length. It was not noted to be bleeding. There were 2 sessile polyps that were very close that were 4 to 5 mm that were not biopsied. 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 The pathology was performed at [facility]. Accession number is [id number].  Of note, he has had staging CT scans which were negative.   Patient denies any abdominal pain, change in bowel habits, bloody stools, or other symptoms. He denies bloating, nausea or vomiting. He has no known history of heart or lung disease other than bronchospasm occasionally with an upper respiratory infection.  He has no family history of cancer of which he is aware.   CT chest/abd/pelvis 12/17/2017 IMPRESSION: 1. Focal area of abnormal enhancement involving the ascending colon may represent mass discovered on recent colonoscopy. 2. No specific findings identified to suggest metastatic disease. 3. 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 Nonspecific. 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 4. Nonspecific 3 mm  right upper lobe lung nodule is noted. No follow-up needed if patient is low-risk. Non-contrast chest CT can be considered in 12 months if patient is high-risk. This recommendation follows the consensus statement: Guidelines for Management of Incidental Pulmonary Nodules Detected on CT Images: From the Fleischner Society 2017; Radiology 2017; 284:228-243. 5. 7 mm low-density structure in segment 8 of the liver is too small to characterize. 6. Aortic Atherosclerosis (ICD10-I70.0). 7. Gallstones.      Past Surgical History William Oneill; 01/25/2018 8:37 AM) Colon Polyp Removal - Colonoscopy  Tonsillectomy   Allergies William Oneill; 01/25/2018 8:37 AM) No Known Drug Allergies [01/25/2018]: Allergies Reconciled   Medication History William Oneill; 01/25/2018 8:38 AM) William Oneill (15MG  Tablet, Oral) Active. Atorvastatin Calcium (10MG  Tablet, Oral) Active. GlipiZIDE (5MG  Tablet, Oral) Active. Ipratropium Bromide (0.03% Solution, Nasal) Active. Januvia (100MG  Tablet, Oral) Active. MetFORMIN HCl (1000MG  Tablet, Oral) Active. Omeprazole (20MG  Capsule DR, Oral) Active. OneTouch Ultra Blue (In Vitro) Active. Permethrin (5% Cream, External) Active. Medications Reconciled  Social History William Oneill; 01/25/2018 8:37 AM) Caffeine use  Carbonated beverages, Coffee, Tea. No alcohol use  Tobacco use  Never smoker.  Family History William Oneill; 01/25/2018 8:37 AM) Heart Disease  Father. Hypertension  Mother.  Other Problems William Oneill; 01/25/2018 8:37 AM) Colon Cancer  Diabetes Mellitus     Review of Systems William Oneill; 01/25/2018 8:37 AM) General Not Present- Appetite Loss, Chills, Fatigue, Fever, Night Sweats, Weight Gain and Weight Loss. Skin Present- Dryness. Not Present- Change in Wart/Mole, Hives, Jaundice, New Lesions, Non-Healing Wounds, Rash and Ulcer. HEENT Present- Wears glasses/contact lenses. Not Present- Earache, Hearing Loss, Hoarseness,  Nose Bleed, Oral Ulcers, Ringing in the Ears, Seasonal  Allergies, Sinus Pain, Sore Throat, Visual Disturbances and Yellow Eyes. Respiratory Not Present- Bloody sputum, Chronic Cough, Difficulty Breathing, Snoring and Wheezing. Gastrointestinal Present- Abdominal Pain. Not Present- Bloating, Bloody Stool, Change in Bowel Habits, Chronic diarrhea, Constipation, Difficulty Swallowing, Excessive gas, Gets full quickly at meals, Hemorrhoids, Indigestion, Nausea, Rectal Pain and Vomiting. Male Genitourinary Present- Nocturia. Not Present- Blood in Urine, Change in Urinary Stream, Frequency, Impotence, Painful Urination, Urgency and Urine Leakage. Endocrine Present- New Diabetes. Not Present- Cold Intolerance, Excessive Hunger, Hair Changes, Heat Intolerance and Hot flashes.  Vitals William Oneill; 01/25/2018 8:39 AM) 01/25/2018 8:38 AM Weight: 185.13 lb Height: 67in Body Surface Area: 1.96 m Body Mass Index: 28.99 kg/m  Temp.: 98.10F(Oral)  Pulse: 64 (Regular)  BP: 132/82 (Sitting, Left Arm, Standard)       Physical Exam William Klein MD; 01/25/2018 9:40 AM) General Mental Status-Alert. General Appearance-Consistent with stated age. Hydration-Well hydrated. Voice-Normal.  Head and Neck Head-normocephalic, atraumatic with no lesions or palpable masses. Trachea-midline. Thyroid Gland Characteristics - normal size and consistency.  Eye Eyeball - Bilateral-Extraocular movements intact. Sclera/Conjunctiva - Bilateral-No scleral icterus.  Chest and Lung Exam Chest and lung exam reveals -quiet, even and easy respiratory effort with no use of accessory muscles and on auscultation, normal breath sounds, no adventitious sounds and normal vocal resonance. Inspection Chest Wall - Normal. Back - normal.  Cardiovascular Cardiovascular examination reveals -normal heart sounds, regular rate and rhythm with no murmurs and normal pedal pulses  bilaterally.  Abdomen Inspection Inspection of the abdomen reveals - No Hernias. Palpation/Percussion Palpation and Percussion of the abdomen reveal - Soft, Non Tender, No Rebound tenderness, No Rigidity (guarding) and No hepatosplenomegaly. Auscultation Auscultation of the abdomen reveals - Bowel sounds normal.  Neurologic Neurologic evaluation reveals -alert and oriented x 3 with no impairment of recent or remote memory. Mental Status-Normal.  Musculoskeletal Global Assessment -Note: no gross deformities.  Normal Exam - Left-Upper Extremity Strength Normal and Lower Extremity Strength Normal. Normal Exam - Right-Upper Extremity Strength Normal and Lower Extremity Strength Normal.  Lymphatic Head & Neck  General Head & Neck Lymphatics: Bilateral - Description - Normal. Axillary  General Axillary Region: Bilateral - Description - Normal. Tenderness - Non Tender. Femoral & Inguinal  Generalized Femoral & Inguinal Lymphatics: Bilateral - Description - No Generalized lymphadenopathy.    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 There is a very small indeterminate liver lesion. Depending on his lymph node status and CEA, this may need follow-up with imaging.  I will plan a laparoscopic ascending colectomy. I reviewed the procedure with the patient including location and size of incisions, time in the hospital, recovery, as well as risks and benefits. I discussed the potential for an ostomy if he were to have complications. I also discussed the risks as bleeding, infection, damage to adjacent structures, need for additional procedures or surgeries, heart or lung complications, hernia, and more.  The patient is retired and lives with his sister. He is in good shape and gets around and does some yard work. He has diabetes  that is well controlled and takes medication for high cholesterol. He is on a baby aspirin.  We will schedule this at the first mutually available opportunity. Current Plans Pt Education - flb right colectomy: discussed with patient and provided information. Pt Education - [facility] Colon Bowel Prep 2018 ERAS/Miralax/Antibiotics Started Neomycin Sulfate 500 MG  Oral Tablet, 2 (two) Tablet SEE NOTE, #6, 01/25/2018, No Refill. Local Order: TAKE TWO TABLETS AT 2 PM, 3 PM, AND 10 PM THE DAY PRIOR TO SURGERY Started Flagyl 500 MG Oral Tablet, 2 (two) Tablet SEE NOTE, #6, 01/25/2018, No Refill. Local Order: Take at 2pm, 3pm, and 10pm the day prior to your colon operation Schedule for Surgery LIVER LESION (K76.9) Impression: Indeterminate, 7 mm. See above. LESION OF LUNG (R91.1) Impression: 3 mm. Indeterminate, but patient is low risk. 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 He may need follow-up with them. If someone is available, I will asked him to look at this in the operating room. However, the potential for future issues is less important at this time than his colon cancer. I would not delay treatment of his colon cancer for work-up of a potential remnant.    Signed by William Klein, MD (01/25/2018 9:46 AM

## 2018-02-02 ENCOUNTER — Inpatient Hospital Stay (HOSPITAL_COMMUNITY)
Admission: RE | Admit: 2018-02-02 | Discharge: 2018-02-06 | DRG: 331 | Disposition: A | Discharge status: Home / Self Care | Payer: Medicare Other | Source: Ambulatory Visit | Attending: General Surgery | Admitting: General Surgery

## 2018-02-02 ENCOUNTER — Encounter (HOSPITAL_COMMUNITY)
Admission: RE | Disposition: A | Discharge status: Home / Self Care | Payer: Self-pay | Source: Ambulatory Visit | Attending: General Surgery

## 2018-02-02 ENCOUNTER — Inpatient Hospital Stay (HOSPITAL_COMMUNITY): Payer: Medicare Other | Admitting: Certified Registered"

## 2018-02-02 ENCOUNTER — Encounter (HOSPITAL_COMMUNITY): Payer: Self-pay | Admitting: Certified Registered"

## 2018-02-02 DIAGNOSIS — Z833 Family history of diabetes mellitus: Secondary | ICD-10-CM

## 2018-02-02 DIAGNOSIS — Z8249 Family history of ischemic heart disease and other diseases of the circulatory system: Secondary | ICD-10-CM | POA: Diagnosis not present

## 2018-02-02 DIAGNOSIS — K769 Liver disease, unspecified: Secondary | ICD-10-CM | POA: Diagnosis present

## 2018-02-02 DIAGNOSIS — E119 Type 2 diabetes mellitus without complications: Secondary | ICD-10-CM

## 2018-02-02 DIAGNOSIS — Z7984 Long term (current) use of oral hypoglycemic drugs: Secondary | ICD-10-CM | POA: Diagnosis not present

## 2018-02-02 DIAGNOSIS — Z79899 Other long term (current) drug therapy: Secondary | ICD-10-CM

## 2018-02-02 DIAGNOSIS — R911 Solitary pulmonary nodule: Secondary | ICD-10-CM | POA: Diagnosis present

## 2018-02-02 DIAGNOSIS — E1165 Type 2 diabetes mellitus with hyperglycemia: Secondary | ICD-10-CM | POA: Diagnosis present

## 2018-02-02 DIAGNOSIS — C180 Malignant neoplasm of cecum: Principal | ICD-10-CM | POA: Diagnosis present

## 2018-02-02 DIAGNOSIS — E65 Localized adiposity: Secondary | ICD-10-CM | POA: Diagnosis present

## 2018-02-02 DIAGNOSIS — E7800 Pure hypercholesterolemia, unspecified: Secondary | ICD-10-CM | POA: Diagnosis present

## 2018-02-02 DIAGNOSIS — C19 Malignant neoplasm of rectosigmoid junction: Secondary | ICD-10-CM | POA: Diagnosis present

## 2018-02-02 DIAGNOSIS — Q644 Malformation of urachus: Secondary | ICD-10-CM | POA: Diagnosis not present

## 2018-02-02 DIAGNOSIS — E669 Obesity, unspecified: Secondary | ICD-10-CM | POA: Diagnosis present

## 2018-02-02 DIAGNOSIS — Z800 Family history of malignant neoplasm of digestive organs: Secondary | ICD-10-CM

## 2018-02-02 DIAGNOSIS — Z6829 Body mass index (BMI) 29.0-29.9, adult: Secondary | ICD-10-CM

## 2018-02-02 HISTORY — PX: LAPAROSCOPIC PARTIAL COLECTOMY: SHX5907

## 2018-02-02 LAB — TYPE AND SCREEN
ABO/RH(D): A POS
ANTIBODY SCREEN: NEGATIVE

## 2018-02-02 LAB — CREATININE, SERUM: Creatinine, Ser: 0.91 mg/dL (ref 0.61–1.24)

## 2018-02-02 LAB — CBC
HEMATOCRIT: 30.7 % — AB (ref 39.0–52.0)
Hemoglobin: 9.5 g/dL — ABNORMAL LOW (ref 13.0–17.0)
MCH: 22.2 pg — ABNORMAL LOW (ref 26.0–34.0)
MCHC: 30.9 g/dL (ref 30.0–36.0)
MCV: 71.7 fL — ABNORMAL LOW (ref 78.0–100.0)
Platelets: 420 10*3/uL — ABNORMAL HIGH (ref 150–400)
RBC: 4.28 MIL/uL (ref 4.22–5.81)
RDW: 15.6 % — AB (ref 11.5–15.5)
WBC: 21.5 10*3/uL — AB (ref 4.0–10.5)

## 2018-02-02 LAB — GLUCOSE, CAPILLARY
GLUCOSE-CAPILLARY: 194 mg/dL — AB (ref 70–99)
Glucose-Capillary: 172 mg/dL — ABNORMAL HIGH (ref 70–99)
Glucose-Capillary: 187 mg/dL — ABNORMAL HIGH (ref 70–99)

## 2018-02-02 SURGERY — LAPAROSCOPIC PARTIAL COLECTOMY
Anesthesia: General

## 2018-02-02 MED ORDER — SODIUM CHLORIDE 0.9 % IV SOLN
2.0000 g | INTRAVENOUS | Status: AC
  Administered 2018-02-02: 2 g via INTRAVENOUS
  Filled 2018-02-02: qty 2

## 2018-02-02 MED ORDER — DEXAMETHASONE SODIUM PHOSPHATE 10 MG/ML IJ SOLN
INTRAMUSCULAR | Status: DC | PRN
  Administered 2018-02-02: 10 mg via INTRAVENOUS

## 2018-02-02 MED ORDER — OXYCODONE HCL 5 MG PO TABS
5.0000 mg | ORAL_TABLET | ORAL | Status: DC | PRN
  Administered 2018-02-02 – 2018-02-03 (×3): 5 mg via ORAL
  Filled 2018-02-02 (×3): qty 1

## 2018-02-02 MED ORDER — DIPHENHYDRAMINE HCL 50 MG/ML IJ SOLN: 12.5000 mg | Freq: Four times a day (QID) | INTRAMUSCULAR | Status: DC | PRN

## 2018-02-02 MED ORDER — ACETAMINOPHEN 325 MG PO TABS
650.0000 mg | ORAL_TABLET | Freq: Four times a day (QID) | ORAL | Status: DC | PRN
  Administered 2018-02-02 – 2018-02-03 (×4): 650 mg via ORAL
  Filled 2018-02-02 (×4): qty 2

## 2018-02-02 MED ORDER — ONDANSETRON HCL 4 MG/2ML IJ SOLN
INTRAMUSCULAR | Status: AC
  Filled 2018-02-02: qty 2

## 2018-02-02 MED ORDER — HYDRALAZINE HCL 20 MG/ML IJ SOLN: 10.0000 mg | INTRAMUSCULAR | Status: DC | PRN

## 2018-02-02 MED ORDER — SUGAMMADEX SODIUM 200 MG/2ML IV SOLN
INTRAVENOUS | Status: DC | PRN
  Administered 2018-02-02: 200 mg via INTRAVENOUS

## 2018-02-02 MED ORDER — SODIUM CHLORIDE 0.9 % IJ SOLN
INTRAMUSCULAR | Status: AC
  Filled 2018-02-02: qty 50

## 2018-02-02 MED ORDER — LIDOCAINE HCL (PF) 1 % IJ SOLN
INTRAMUSCULAR | Status: AC
  Filled 2018-02-02: qty 30

## 2018-02-02 MED ORDER — ONDANSETRON HCL 4 MG PO TABS
4.0000 mg | ORAL_TABLET | Freq: Four times a day (QID) | ORAL | Status: DC | PRN
Start: 2018-02-02 — End: 2018-02-06

## 2018-02-02 MED ORDER — DIPHENHYDRAMINE HCL 12.5 MG/5ML PO ELIX: 12.5000 mg | ORAL_SOLUTION | Freq: Four times a day (QID) | ORAL | Status: DC | PRN

## 2018-02-02 MED ORDER — FENTANYL CITRATE (PF) 250 MCG/5ML IJ SOLN
INTRAMUSCULAR | Status: AC
  Filled 2018-02-02: qty 5

## 2018-02-02 MED ORDER — FENTANYL CITRATE (PF) 250 MCG/5ML IJ SOLN
INTRAMUSCULAR | Status: DC | PRN
  Administered 2018-02-02: 50 ug via INTRAVENOUS
  Administered 2018-02-02: 100 ug via INTRAVENOUS
  Administered 2018-02-02: 50 ug via INTRAVENOUS

## 2018-02-02 MED ORDER — EPHEDRINE SULFATE-NACL 50-0.9 MG/10ML-% IV SOSY
PREFILLED_SYRINGE | INTRAVENOUS | Status: DC | PRN
  Administered 2018-02-02 (×2): 10 mg via INTRAVENOUS

## 2018-02-02 MED ORDER — ONDANSETRON HCL 4 MG/2ML IJ SOLN: 4.0000 mg | Freq: Four times a day (QID) | INTRAMUSCULAR | Status: DC | PRN

## 2018-02-02 MED ORDER — ALVIMOPAN 12 MG PO CAPS
12.0000 mg | ORAL_CAPSULE | Freq: Two times a day (BID) | ORAL | Status: DC
  Administered 2018-02-03 – 2018-02-05 (×5): 12 mg via ORAL
  Filled 2018-02-02 (×5): qty 1

## 2018-02-02 MED ORDER — SUGAMMADEX SODIUM 200 MG/2ML IV SOLN
INTRAVENOUS | Status: AC
  Filled 2018-02-02: qty 2

## 2018-02-02 MED ORDER — KETAMINE HCL 10 MG/ML IJ SOLN
INTRAMUSCULAR | Status: AC
  Filled 2018-02-02: qty 1

## 2018-02-02 MED ORDER — GABAPENTIN 300 MG PO CAPS
300.0000 mg | ORAL_CAPSULE | ORAL | Status: AC
  Administered 2018-02-02: 300 mg via ORAL
  Filled 2018-02-02: qty 1

## 2018-02-02 MED ORDER — DEXAMETHASONE SODIUM PHOSPHATE 10 MG/ML IJ SOLN
INTRAMUSCULAR | Status: AC
  Filled 2018-02-02: qty 1

## 2018-02-02 MED ORDER — FENTANYL CITRATE (PF) 100 MCG/2ML IJ SOLN: 25.0000 ug | INTRAMUSCULAR | Status: DC | PRN

## 2018-02-02 MED ORDER — ROCURONIUM BROMIDE 100 MG/10ML IV SOLN
INTRAVENOUS | Status: DC | PRN
  Administered 2018-02-02: 10 mg via INTRAVENOUS
  Administered 2018-02-02: 70 mg via INTRAVENOUS

## 2018-02-02 MED ORDER — EPHEDRINE 5 MG/ML INJ
INTRAVENOUS | Status: AC
  Filled 2018-02-02: qty 10

## 2018-02-02 MED ORDER — BUPIVACAINE-EPINEPHRINE (PF) 0.25% -1:200000 IJ SOLN
INTRAMUSCULAR | Status: AC
  Filled 2018-02-02: qty 30

## 2018-02-02 MED ORDER — ROCURONIUM BROMIDE 100 MG/10ML IV SOLN
INTRAVENOUS | Status: AC
  Filled 2018-02-02: qty 1

## 2018-02-02 MED ORDER — ONDANSETRON HCL 4 MG/2ML IJ SOLN
INTRAMUSCULAR | Status: DC | PRN
  Administered 2018-02-02: 4 mg via INTRAVENOUS

## 2018-02-02 MED ORDER — ALVIMOPAN 12 MG PO CAPS
12.0000 mg | ORAL_CAPSULE | ORAL | Status: AC
  Administered 2018-02-02: 12 mg via ORAL
  Filled 2018-02-02: qty 1

## 2018-02-02 MED ORDER — LIDOCAINE HCL 1 % IJ SOLN
INTRAMUSCULAR | Status: DC | PRN
  Administered 2018-02-02: 30 mL

## 2018-02-02 MED ORDER — KETAMINE HCL 10 MG/ML IJ SOLN
INTRAMUSCULAR | Status: DC | PRN
  Administered 2018-02-02: 40 mg via INTRAVENOUS

## 2018-02-02 MED ORDER — ENSURE SURGERY PO LIQD
237.0000 mL | Freq: Two times a day (BID) | ORAL | Status: DC
  Administered 2018-02-03 – 2018-02-05 (×6): 237 mL via ORAL
  Filled 2018-02-02 (×8): qty 237

## 2018-02-02 MED ORDER — BUPIVACAINE LIPOSOME 1.3 % IJ SUSP
INTRAMUSCULAR | Status: DC | PRN
  Administered 2018-02-02: 20 mL

## 2018-02-02 MED ORDER — SODIUM CHLORIDE 0.9 % IV SOLN
2.0000 g | Freq: Two times a day (BID) | INTRAVENOUS | Status: AC
  Administered 2018-02-03: 2 g via INTRAVENOUS
  Filled 2018-02-02: qty 2

## 2018-02-02 MED ORDER — LIDOCAINE 2% (20 MG/ML) 5 ML SYRINGE
INTRAMUSCULAR | Status: DC | PRN
  Administered 2018-02-02: 100 mg via INTRAVENOUS

## 2018-02-02 MED ORDER — LIDOCAINE 2% (20 MG/ML) 5 ML SYRINGE
INTRAMUSCULAR | Status: DC | PRN
  Administered 2018-02-02: 1.5 mg/kg/h via INTRAVENOUS

## 2018-02-02 MED ORDER — PROPOFOL 10 MG/ML IV BOLUS
INTRAVENOUS | Status: DC | PRN
  Administered 2018-02-02: 150 mg via INTRAVENOUS

## 2018-02-02 MED ORDER — CHLORHEXIDINE GLUCONATE CLOTH 2 % EX PADS: 6.0000 | MEDICATED_PAD | Freq: Once | CUTANEOUS | Status: DC

## 2018-02-02 MED ORDER — INSULIN ASPART 100 UNIT/ML ~~LOC~~ SOLN
0.0000 [IU] | Freq: Three times a day (TID) | SUBCUTANEOUS | Status: DC
  Administered 2018-02-03: 5 [IU] via SUBCUTANEOUS
  Administered 2018-02-03 – 2018-02-04 (×4): 8 [IU] via SUBCUTANEOUS
  Administered 2018-02-04: 3 [IU] via SUBCUTANEOUS
  Administered 2018-02-05 (×2): 5 [IU] via SUBCUTANEOUS
  Administered 2018-02-05 – 2018-02-06 (×2): 3 [IU] via SUBCUTANEOUS

## 2018-02-02 MED ORDER — LACTATED RINGERS IV SOLN
INTRAVENOUS | Status: DC
  Administered 2018-02-02 (×2): via INTRAVENOUS

## 2018-02-02 MED ORDER — ENOXAPARIN SODIUM 40 MG/0.4ML ~~LOC~~ SOLN
40.0000 mg | SUBCUTANEOUS | Status: DC
Start: 2018-02-03 — End: 2018-02-06
  Administered 2018-02-03 – 2018-02-06 (×4): 40 mg via SUBCUTANEOUS
  Filled 2018-02-02 (×4): qty 0.4

## 2018-02-02 MED ORDER — SODIUM CHLORIDE 0.45 % IV SOLN
INTRAVENOUS | Status: DC
  Administered 2018-02-02: 19:00:00 via INTRAVENOUS
  Administered 2018-02-03 – 2018-02-04 (×2): 100 mL/h via INTRAVENOUS

## 2018-02-02 MED ORDER — GABAPENTIN 300 MG PO CAPS
300.0000 mg | ORAL_CAPSULE | Freq: Two times a day (BID) | ORAL | Status: DC
  Administered 2018-02-02 – 2018-02-04 (×5): 300 mg via ORAL
  Filled 2018-02-02 (×5): qty 1

## 2018-02-02 MED ORDER — MORPHINE SULFATE (PF) 2 MG/ML IV SOLN
1.0000 mg | INTRAVENOUS | Status: DC | PRN
  Administered 2018-02-02: 1 mg via INTRAVENOUS
  Filled 2018-02-02: qty 1

## 2018-02-02 MED ORDER — SODIUM CHLORIDE 0.9 % IV SOLN
INTRAVENOUS | Status: DC
  Filled 2018-02-02: qty 6

## 2018-02-02 MED ORDER — ALUM & MAG HYDROXIDE-SIMETH 200-200-20 MG/5ML PO SUSP: 30.0000 mL | Freq: Four times a day (QID) | ORAL | Status: DC | PRN

## 2018-02-02 MED ORDER — ACETAMINOPHEN 500 MG PO TABS
1000.0000 mg | ORAL_TABLET | ORAL | Status: AC
  Administered 2018-02-02: 1000 mg via ORAL
  Filled 2018-02-02: qty 2

## 2018-02-02 MED ORDER — LIDOCAINE HCL 2 % IJ SOLN
INTRAMUSCULAR | Status: AC
  Filled 2018-02-02: qty 20

## 2018-02-02 SURGICAL SUPPLY — 79 items
APPLIER CLIP 5 13 M/L LIGAMAX5 (MISCELLANEOUS)
APPLIER CLIP ROT 10 11.4 M/L (STAPLE)
APR CLP MED LRG 11.4X10 (STAPLE)
APR CLP MED LRG 5 ANG JAW (MISCELLANEOUS)
BLADE EXTENDED COATED 6.5IN (ELECTRODE) IMPLANT
BLADE HEX COATED 2.75 (ELECTRODE) ×3 IMPLANT
CABLE HIGH FREQUENCY MONO STRZ (ELECTRODE) ×3 IMPLANT
CATH KIT ON-Q SILVERSOAK 7.5 (CATHETERS) IMPLANT
CATH KIT ON-Q SILVERSOAK 7.5IN (CATHETERS) IMPLANT
CELLS DAT CNTRL 66122 CELL SVR (MISCELLANEOUS) IMPLANT
CHLORAPREP W/TINT 26ML (MISCELLANEOUS) ×3 IMPLANT
CLIP APPLIE 5 13 M/L LIGAMAX5 (MISCELLANEOUS) IMPLANT
CLIP APPLIE ROT 10 11.4 M/L (STAPLE) IMPLANT
COUNTER NEEDLE 20 DBL MAG RED (NEEDLE) ×3 IMPLANT
COVER MAYO STAND STRL (DRAPES) ×9 IMPLANT
DECANTER SPIKE VIAL GLASS SM (MISCELLANEOUS) ×3 IMPLANT
DRAIN CHANNEL 19F RND (DRAIN) IMPLANT
DRAPE LAPAROSCOPIC ABDOMINAL (DRAPES) ×3 IMPLANT
DRSG OPSITE POSTOP 3X4 (GAUZE/BANDAGES/DRESSINGS) ×2 IMPLANT
DRSG OPSITE POSTOP 4X10 (GAUZE/BANDAGES/DRESSINGS) IMPLANT
DRSG OPSITE POSTOP 4X6 (GAUZE/BANDAGES/DRESSINGS) IMPLANT
DRSG OPSITE POSTOP 4X8 (GAUZE/BANDAGES/DRESSINGS) IMPLANT
DRSG TEGADERM 2-3/8X2-3/4 SM (GAUZE/BANDAGES/DRESSINGS) ×2 IMPLANT
ELECT REM PT RETURN 15FT ADLT (MISCELLANEOUS) ×3 IMPLANT
ENDOLOOP SUT PDS II  0 18 (SUTURE) ×4
ENDOLOOP SUT PDS II 0 18 (SUTURE) IMPLANT
EVACUATOR SILICONE 100CC (DRAIN) IMPLANT
GAUZE SPONGE 2X2 8PLY STRL LF (GAUZE/BANDAGES/DRESSINGS) IMPLANT
GAUZE SPONGE 4X4 12PLY STRL (GAUZE/BANDAGES/DRESSINGS) ×3 IMPLANT
GLOVE BIO SURGEON STRL SZ 6 (GLOVE) ×6 IMPLANT
GLOVE INDICATOR 6.5 STRL GRN (GLOVE) ×6 IMPLANT
GOWN STRL REUS W/ TWL XL LVL3 (GOWN DISPOSABLE) ×2 IMPLANT
GOWN STRL REUS W/TWL XL LVL3 (GOWN DISPOSABLE) ×6
L-HOOK LAP DISP 36CM (ELECTROSURGICAL) ×3
LEGGING LITHOTOMY PAIR STRL (DRAPES) IMPLANT
LHOOK LAP DISP 36CM (ELECTROSURGICAL) IMPLANT
PACK COLON (CUSTOM PROCEDURE TRAY) ×3 IMPLANT
PAD POSITIONING PINK XL (MISCELLANEOUS) IMPLANT
PORT LAP GEL ALEXIS MED 5-9CM (MISCELLANEOUS) ×2 IMPLANT
RELOAD PROXIMATE 75MM BLUE (ENDOMECHANICALS) ×6 IMPLANT
RELOAD STAPLE 75 3.8 BLU REG (ENDOMECHANICALS) IMPLANT
RETRACTOR WND ALEXIS 18 MED (MISCELLANEOUS) IMPLANT
RTRCTR WOUND ALEXIS 18CM MED (MISCELLANEOUS)
SCISSORS LAP 5X35 DISP (ENDOMECHANICALS) ×3 IMPLANT
SEALER TISSUE G2 STRG ARTC 35C (ENDOMECHANICALS) ×3 IMPLANT
SET IRRIG TUBING LAPAROSCOPIC (IRRIGATION / IRRIGATOR) ×3 IMPLANT
SLEEVE SURGEON STRL (DRAPES) IMPLANT
SLEEVE XCEL OPT CAN 5 100 (ENDOMECHANICALS) ×6 IMPLANT
SPONGE GAUZE 2X2 STER 10/PKG (GAUZE/BANDAGES/DRESSINGS) ×2
SPONGE LAP 18X18 RF (DISPOSABLE) ×2 IMPLANT
STAPLER GUN LINEAR PROX 60 (STAPLE) IMPLANT
STAPLER PROXIMATE 75MM BLUE (STAPLE) ×2 IMPLANT
STAPLER VISISTAT 35W (STAPLE) ×3 IMPLANT
SUT ETHILON 2 0 PS N (SUTURE) IMPLANT
SUT MNCRL AB 4-0 PS2 18 (SUTURE) ×3 IMPLANT
SUT PDS AB 1 CTX 36 (SUTURE) IMPLANT
SUT PDS AB 1 TP1 96 (SUTURE) IMPLANT
SUT SILK 2 0 (SUTURE)
SUT SILK 2 0 SH CR/8 (SUTURE) IMPLANT
SUT SILK 2-0 18XBRD TIE 12 (SUTURE) IMPLANT
SUT SILK 3 0 (SUTURE)
SUT SILK 3 0 SH CR/8 (SUTURE) IMPLANT
SUT SILK 3-0 18XBRD TIE 12 (SUTURE) IMPLANT
SUT VIC AB 2-0 SH 18 (SUTURE) ×3 IMPLANT
SUT VIC AB 3-0 SH 18 (SUTURE) ×3 IMPLANT
SUT VICRYL 2 0 18  UND BR (SUTURE) ×2
SUT VICRYL 2 0 18 UND BR (SUTURE) ×1 IMPLANT
SUT VICRYL 3 0 BR 18  UND (SUTURE) ×2
SUT VICRYL 3 0 BR 18 UND (SUTURE) ×1 IMPLANT
SYS LAPSCP GELPORT 120MM (MISCELLANEOUS)
SYSTEM LAPSCP GELPORT 120MM (MISCELLANEOUS) IMPLANT
TOWEL OR 17X26 10 PK STRL BLUE (TOWEL DISPOSABLE) IMPLANT
TOWEL OR NON WOVEN STRL DISP B (DISPOSABLE) ×3 IMPLANT
TRAY FOLEY MTR SLVR 16FR STAT (SET/KITS/TRAYS/PACK) IMPLANT
TROCAR BLADELESS OPT 5 100 (ENDOMECHANICALS) ×3 IMPLANT
TROCAR XCEL BLUNT TIP 100MML (ENDOMECHANICALS) IMPLANT
TROCAR XCEL NON-BLD 11X100MML (ENDOMECHANICALS) IMPLANT
TUBING INSUF HEATED (TUBING) ×3 IMPLANT
TUNNELER SHEATH ON-Q 16GX12 DP (PAIN MANAGEMENT) IMPLANT

## 2018-02-02 NOTE — Anesthesia Postprocedure Evaluation (Signed)
Anesthesia Post Note  Patient: William Oneill  Procedure(s) Performed: LAPAROSCOPIC ASCENDING COLECTOMY (N/A )     Patient location during evaluation: PACU Anesthesia Type: General Level of consciousness: awake Pain management: pain level controlled Vital Signs Assessment: post-procedure vital signs reviewed and stable Respiratory status: spontaneous breathing Cardiovascular status: stable Postop Assessment: no headache Anesthetic complications: no    Last Vitals:  Vitals:   02/02/18 1800 02/02/18 1815  BP: (!) 148/77 (!) 146/76  Pulse: 61 63  Resp: 13 13  Temp:  (!) 36.4 C  SpO2: 100% 100%    Last Pain:  Vitals:   02/02/18 1815  TempSrc:   PainSc: 0-No pain                 Swara Donze

## 2018-02-02 NOTE — Anesthesia Preprocedure Evaluation (Addendum)
Anesthesia Evaluation  Patient identified by MRN, date of birth, ID band Patient awake    Reviewed: Allergy & Precautions, NPO status , Patient's Chart, lab work & pertinent test results  Airway Mallampati: II  TM Distance: >3 FB     Dental   Pulmonary pneumonia,    breath sounds clear to auscultation       Cardiovascular negative cardio ROS   Rhythm:Regular Rate:Normal     Neuro/Psych    GI/Hepatic negative GI ROS, Neg liver ROS,   Endo/Other  diabetes  Renal/GU negative Renal ROS     Musculoskeletal   Abdominal   Peds  Hematology   Anesthesia Other Findings   Reproductive/Obstetrics                             Anesthesia Physical Anesthesia Plan  ASA: III  Anesthesia Plan: General   Post-op Pain Management:    Induction: Intravenous  PONV Risk Score and Plan: Treatment may vary due to age or medical condition, Ondansetron, Dexamethasone and Midazolam  Airway Management Planned: Oral ETT  Additional Equipment:   Intra-op Plan:   Post-operative Plan: Possible Post-op intubation/ventilation  Informed Consent: I have reviewed the patients History and Physical, chart, labs and discussed the procedure including the risks, benefits and alternatives for the proposed anesthesia with the patient or authorized representative who has indicated his/her understanding and acceptance.   Dental advisory given  Plan Discussed with: CRNA and Anesthesiologist  Anesthesia Plan Comments:        Anesthesia Quick Evaluation

## 2018-02-02 NOTE — Interval H&P Note (Signed)
History and Physical Interval Note:  02/02/2018 2:59 PM  William Oneill  has presented today for surgery, with the diagnosis of CECAL CANCER  The various methods of treatment have been discussed with the patient and family. After consideration of risks, benefits and other options for treatment, the patient has consented to  Procedure(s): LAPAROSCOPIC ASCENDING COLECTOMY (N/A) as a surgical intervention .  The patient's history has been reviewed, patient examined, no change in status, stable for surgery.  I have reviewed the patient's chart and labs.  Questions were answered to the patient's satisfaction.     Stark Klein

## 2018-02-02 NOTE — Anesthesia Procedure Notes (Signed)
Procedure Name: Intubation Date/Time: 02/02/2018 3:13 PM Performed by: Sharlette Dense, CRNA Patient Re-evaluated:Patient Re-evaluated prior to induction Oxygen Delivery Method: Circle system utilized Preoxygenation: Pre-oxygenation with 100% oxygen Induction Type: IV induction Ventilation: Mask ventilation without difficulty and Oral airway inserted - appropriate to patient size Laryngoscope Size: Miller and 3 Grade View: Grade I Tube type: Oral Tube size: 8.0 mm Number of attempts: 1 Airway Equipment and Method: Stylet Placement Confirmation: ETT inserted through vocal cords under direct vision,  positive ETCO2 and breath sounds checked- equal and bilateral Secured at: 22 cm Tube secured with: Tape Dental Injury: Teeth and Oropharynx as per pre-operative assessment

## 2018-02-02 NOTE — Transfer of Care (Signed)
Immediate Anesthesia Transfer of Care Note  Patient: William Oneill  Procedure(s) Performed: LAPAROSCOPIC ASCENDING COLECTOMY (N/A )  Patient Location: PACU  Anesthesia Type:General  Level of Consciousness: sedated, patient cooperative and responds to stimulation  Airway & Oxygen Therapy: Patient Spontanous Breathing and Patient connected to face mask oxygen  Post-op Assessment: Report given to RN and Post -op Vital signs reviewed and stable  Post vital signs: Reviewed and stable  Last Vitals:  Vitals Value Taken Time  BP 155/77 02/02/2018  5:41 PM  Temp    Pulse 64 02/02/2018  5:42 PM  Resp 16 02/02/2018  5:44 PM  SpO2 100 % 02/02/2018  5:42 PM  Vitals shown include unvalidated device data.  Last Pain:  Vitals:   02/02/18 1317  TempSrc:   PainSc: 0-No pain      Patients Stated Pain Goal: 4 (92/34/14 4360)  Complications: No apparent anesthesia complications

## 2018-02-02 NOTE — Op Note (Signed)
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 The risks, benefits, complications, treatment options, and expected outcomes were discussed with the patient. The possibilities of reaction to medication, perforation of viscus, bleeding, recurrent infection, finding a normal colon, the need for additional procedures, failure to diagnose a condition, and creating a complication requiring transfusion or operation were discussed with the patient. The patient was advised of the risk of ostomy.  The patient concurred with the proposed plan, giving informed consent.   The patient was taken to the operating room, identified, and the procedure verified as partial colectomy. A Time Out was held and the above information confirmed.  The patient was brought to the operating room and placed supine. After induction of a general anesthetic, a Foley catheter was inserted and the abdomen was prepped and draped in standard fashion. The patient was placed into reverse the lumbar position and rotated to the right. A small incision at the costal margin was made with a #11 blade. Local anesthetic was introduced.  The 5 mm Optiview trocar was placed under direct visualization.  Pneumoperitoneum was insufflated to a pressure of 15 mm Hg. The laparoscope was introduced.    Exploration revealed a normal omentum, colon, small bowel, peritoneum, liver, and stomach. Two 5 mm left sided 5-mm trocars were then placed after anesthetizing the skin and peritoneum with local anesthetic. The ascending  colon and hepatic flexure were then mobilized with gentle retraction of the colon in a medial direction with mobilization of the peritoneal reflection with cautery and the EnSeal.  The omentum was taken off the transverse colon. Mobilization of this area was complete to expose the retroperitoneum.  There was minimal blood loss during this portion of the procedure.      The colon was evaluated to make sure it was adequately mobilized.  A 6 cm vertical incision was made above the umbilicus.  The subcutaneous tissues were divided with the cautery.  The fascia was the cautery as well. The fascial incision was carried out point the skin incision. The protractor was placed and the abdomen to protect the skin. The colon was delivered through the incision.  The colon was resected with a linear stapling device proximal and distal to the area in question in regard to the specimen. The mesenteric vessels were clamped and ligated. The tissues were extremely friable and several areas required suture ligation.The specimen was submitted to pathology.      An side-to-side, functional end-to-end anastomosis was performed through the small anterior incision with the linear stapling device. The mucosa was inspected and found to be hemostatic. Closure was achieved with the linear stapling device. A 3-0 vicryl suture was used to reapproximate the angle of the anastomosis. Hemostasis was confirmed. The bowel anastomosis was returned.  The abdomen was irrigated.    The OnQ tunneler was placed on both sides of the fascial incision. The fascial incision was then closed with a running 0 looped PDS suture. The incisions were closed with staples. The OnQ catheters were advanced through the wound.  Soft dressings were applied.  Instrument, sponge, and needle counts were correct prior to abdominal closure and  at the conclusion of the case.   Findings: small cancer over cecum  Estimated Blood Loss: 50 mL         Drains:  none  Specimens: right colon            Complications: None; patient tolerated the procedure well.         Disposition: PACU - hemodynamically stable.

## 2018-02-03 ENCOUNTER — Encounter (HOSPITAL_COMMUNITY): Payer: Self-pay | Admitting: General Surgery

## 2018-02-03 ENCOUNTER — Other Ambulatory Visit: Payer: Self-pay

## 2018-02-03 LAB — CBC
HEMATOCRIT: 29.6 % — AB (ref 39.0–52.0)
Hemoglobin: 9 g/dL — ABNORMAL LOW (ref 13.0–17.0)
MCH: 21.8 pg — ABNORMAL LOW (ref 26.0–34.0)
MCHC: 30.4 g/dL (ref 30.0–36.0)
MCV: 71.8 fL — ABNORMAL LOW (ref 78.0–100.0)
Platelets: 366 10*3/uL (ref 150–400)
RBC: 4.12 MIL/uL — ABNORMAL LOW (ref 4.22–5.81)
RDW: 15.6 % — AB (ref 11.5–15.5)
WBC: 18 10*3/uL — ABNORMAL HIGH (ref 4.0–10.5)

## 2018-02-03 LAB — GLUCOSE, CAPILLARY
GLUCOSE-CAPILLARY: 259 mg/dL — AB (ref 70–99)
GLUCOSE-CAPILLARY: 243 mg/dL — AB (ref 70–99)
GLUCOSE-CAPILLARY: 249 mg/dL — AB (ref 70–99)
Glucose-Capillary: 255 mg/dL — ABNORMAL HIGH (ref 70–99)
Glucose-Capillary: 282 mg/dL — ABNORMAL HIGH (ref 70–99)

## 2018-02-03 LAB — BASIC METABOLIC PANEL
Anion gap: 11 (ref 5–15)
BUN: 15 mg/dL (ref 8–23)
CALCIUM: 8.2 mg/dL — AB (ref 8.9–10.3)
CO2: 19 mmol/L — AB (ref 22–32)
Chloride: 109 mmol/L (ref 98–111)
Creatinine, Ser: 0.94 mg/dL (ref 0.61–1.24)
GFR calc Af Amer: >60 mL/min (ref >60)
GLUCOSE: 286 mg/dL — AB (ref 70–99)
Potassium: 4.2 mmol/L (ref 3.5–5.1)
Sodium: 139 mmol/L (ref 135–145)

## 2018-02-03 NOTE — Progress Notes (Signed)
1 Day Post-Op   Subjective/Chief Complaint: Pt has been up and out of bed.  Pain is tolerable.  No n/v.  Having some flatus.     Objective: Vital signs in last 24 hours: Temp:  [97.4 F (36.3 C)-99.1 F (37.3 C)] 98.6 F (37 C) (07/03 0547) Pulse Rate:  [59-85] 81 (07/03 0547) Resp:  [13-24] 18 (07/03 0547) BP: (115-155)/(59-79) 118/64 (07/03 0547) SpO2:  [97 %-100 %] 97 % (07/03 0547) Weight:  [82.6 kg (182 lb)] 82.6 kg (182 lb) (07/02 1317)    Intake/Output from previous day: 07/02 0701 - 07/03 0700 In: 2616.7 [P.O.:320; I.V.:2196.7; IV Piggyback:100] Out: 1025 [Urine:975; Blood:50] Intake/Output this shift: Total I/O In: 1096.7 [P.O.:200; I.V.:896.7] Out: 800 [Urine:800]  General appearance: alert, cooperative and no distress Resp: breathing comfortably GI: soft, non distended, approp tender at incision.  dressing c/d/i. Extremities: no c/c/e  Lab Results:  Recent Labs    02/02/18 1913 02/03/18 0517  WBC 21.5* 18.0*  HGB 9.5* 9.0*  HCT 30.7* 29.6*  PLT 420* 366   BMET Recent Labs    02/02/18 1913 02/03/18 0517  NA  --  139  K  --  4.2  CL  --  109  CO2  --  19*  GLUCOSE  --  286*  BUN  --  15  CREATININE 0.91 0.94  CALCIUM  --  8.2*   PT/INR No results for input(s): LABPROT, INR in the last 72 hours. ABG No results for input(s): PHART, HCO3 in the last 72 hours.  Invalid input(s): PCO2, PO2  Studies/Results: No results found.  Anti-infectives: Anti-infectives (From admission, onward)   Start     Dose/Rate Route Frequency Ordered Stop   02/03/18 0600  cefoTEtan (CEFOTAN) 2 g in sodium chloride 0.9 % 100 mL IVPB     2 g 200 mL/hr over 30 Minutes Intravenous On call to O.R. 02/02/18 1252 02/02/18 1530   02/03/18 0200  cefoTEtan (CEFOTAN) 2 g in sodium chloride 0.9 % 100 mL IVPB     2 g 200 mL/hr over 30 Minutes Intravenous Every 12 hours 02/02/18 1854 02/03/18 0300   02/02/18 1300  clindamycin (CLEOCIN) 900 mg, gentamicin (GARAMYCIN) 240 mg  in sodium chloride 0.9 % 1,000 mL for intraperitoneal lavage  Status:  Discontinued      Intraperitoneal To Surgery 02/02/18 1252 02/02/18 1845      Assessment/Plan: s/p Procedure(s): LAPAROSCOPIC ASCENDING COLECTOMY (N/A) d/c foley Advance diet increase insulin for better glucose control  Decrease IV fluids   LOS: 1 day    Stark Klein 02/03/2018

## 2018-02-03 NOTE — Progress Notes (Signed)
Results for William Oneill, William Oneill (MRN [id number]) as of 02/03/2018 14:00  Ref. Range 02/02/2018 16:45 02/02/2018 17:55 02/03/2018 05:44 02/03/2018 08:00 02/03/2018 11:41  Glucose-Capillary Latest Ref Range: 70 - 99 mg/dL 194 (H) 187 (H) 259 (H) 243 (H) 255 (H)    Inpatient Diabetes Program Recommendations  AACE/ADA: New Consensus Statement on Inpatient Glycemic Control (2015)  Target Ranges:  Prepandial:   less than 140 mg/dL      Peak postprandial:   less than 180 mg/dL (1-2 hours)      Critically ill patients:  140 - 180 mg/dL   Lab Results  Component Value Date   GLUCAP 255 (H) 02/03/2018   HGBA1C 8.6 (H) 01/26/2018    Review of Glycemic Control  Diabetes history: Type 2 Outpatient Diabetes medications: Glucotrol, Metformin, Januvia Current orders for Inpatient glycemic control: Novolog MODERATE TID  Inpatient Diabetes Program Recommendations:    CBGs have been greater than 180 mg/dl.  Recommend adding Lantus 12 units daily (82.6 kg X 0.15 units/kg = 12.3 units) and continuing Novolog MODERATE correction scale TID. Will continue to monitor blood sugars while in the hospital.  Harvel Ricks RN BSN CDE Diabetes Coordinator Pager: [phone]  8am-5pm

## 2018-02-04 LAB — BASIC METABOLIC PANEL
Anion gap: 5 (ref 5–15)
BUN: 19 mg/dL (ref 8–23)
CALCIUM: 8 mg/dL — AB (ref 8.9–10.3)
CO2: 26 mmol/L (ref 22–32)
CREATININE: 0.98 mg/dL (ref 0.61–1.24)
Chloride: 108 mmol/L (ref 98–111)
GFR calc Af Amer: >60 mL/min (ref >60)
Glucose, Bld: 270 mg/dL — ABNORMAL HIGH (ref 70–99)
Potassium: 5 mmol/L (ref 3.5–5.1)
Sodium: 139 mmol/L (ref 135–145)

## 2018-02-04 LAB — CBC
HCT: 27.4 % — ABNORMAL LOW (ref 39.0–52.0)
Hemoglobin: 8.5 g/dL — ABNORMAL LOW (ref 13.0–17.0)
MCH: 22 pg — ABNORMAL LOW (ref 26.0–34.0)
MCHC: 31 g/dL (ref 30.0–36.0)
MCV: 70.8 fL — ABNORMAL LOW (ref 78.0–100.0)
PLATELETS: 374 10*3/uL (ref 150–400)
RBC: 3.87 MIL/uL — ABNORMAL LOW (ref 4.22–5.81)
RDW: 15.9 % — AB (ref 11.5–15.5)
WBC: 11.9 10*3/uL — ABNORMAL HIGH (ref 4.0–10.5)

## 2018-02-04 LAB — GLUCOSE, CAPILLARY
GLUCOSE-CAPILLARY: 268 mg/dL — AB (ref 70–99)
GLUCOSE-CAPILLARY: 166 mg/dL — AB (ref 70–99)
Glucose-Capillary: 256 mg/dL — ABNORMAL HIGH (ref 70–99)
Glucose-Capillary: 185 mg/dL — ABNORMAL HIGH (ref 70–99)

## 2018-02-04 MED ORDER — DIPHENHYDRAMINE HCL 12.5 MG/5ML PO ELIX: 12.5000 mg | ORAL_SOLUTION | Freq: Three times a day (TID) | ORAL | Status: DC | PRN

## 2018-02-04 MED ORDER — GUAIFENESIN-DM 100-10 MG/5ML PO SYRP: 10.0000 mL | ORAL_SOLUTION | ORAL | Status: DC | PRN

## 2018-02-04 MED ORDER — HYDROXYZINE HCL 25 MG PO TABS
12.5000 mg | ORAL_TABLET | Freq: Every evening | ORAL | Status: DC | PRN
Start: 2018-02-04 — End: 2018-02-06

## 2018-02-04 MED ORDER — BENZONATATE 100 MG PO CAPS: 100.0000 mg | ORAL_CAPSULE | Freq: Two times a day (BID) | ORAL | Status: DC | PRN

## 2018-02-04 MED ORDER — SODIUM CHLORIDE 0.9 % IV SOLN: 250.0000 mL | INTRAVENOUS | Status: DC | PRN

## 2018-02-04 MED ORDER — SODIUM CHLORIDE 0.9% FLUSH
3.0000 mL | Freq: Two times a day (BID) | INTRAVENOUS | Status: DC
  Administered 2018-02-04 – 2018-02-06 (×5): 3 mL via INTRAVENOUS

## 2018-02-04 MED ORDER — PSYLLIUM 95 % PO PACK
1.0000 | PACK | Freq: Every day | ORAL | Status: DC
  Administered 2018-02-04 – 2018-02-05 (×2): 1 via ORAL
  Filled 2018-02-04 (×3): qty 1

## 2018-02-04 MED ORDER — ADULT MULTIVITAMIN W/MINERALS CH
1.0000 | ORAL_TABLET | Freq: Every day | ORAL | Status: DC
  Administered 2018-02-04 – 2018-02-06 (×3): 1 via ORAL
  Filled 2018-02-04 (×3): qty 1

## 2018-02-04 MED ORDER — SODIUM CHLORIDE 0.9% FLUSH: 3.0000 mL | INTRAVENOUS | Status: DC | PRN

## 2018-02-04 MED ORDER — ATORVASTATIN CALCIUM 10 MG PO TABS
10.0000 mg | ORAL_TABLET | Freq: Every day | ORAL | Status: DC
  Administered 2018-02-04 – 2018-02-06 (×3): 10 mg via ORAL
  Filled 2018-02-04 (×3): qty 1

## 2018-02-04 MED ORDER — DIPHENHYDRAMINE HCL 50 MG/ML IJ SOLN: 12.5000 mg | Freq: Four times a day (QID) | INTRAMUSCULAR | Status: DC | PRN

## 2018-02-04 MED ORDER — PHENOL 1.4 % MT LIQD
1.0000 | OROMUCOSAL | Status: DC | PRN
  Filled 2018-02-04: qty 177

## 2018-02-04 MED ORDER — HYDROCORTISONE 2.5 % RE CREA
1.0000 "application " | TOPICAL_CREAM | Freq: Four times a day (QID) | RECTAL | Status: DC | PRN
  Filled 2018-02-04: qty 28.35

## 2018-02-04 MED ORDER — GLIPIZIDE 5 MG PO TABS
5.0000 mg | ORAL_TABLET | Freq: Two times a day (BID) | ORAL | Status: DC
  Administered 2018-02-04 – 2018-02-06 (×4): 5 mg via ORAL
  Filled 2018-02-04 (×4): qty 1

## 2018-02-04 MED ORDER — MENTHOL 3 MG MT LOZG: 1.0000 | LOZENGE | OROMUCOSAL | Status: DC | PRN

## 2018-02-04 MED ORDER — HYDROCORTISONE 1 % EX CREA
1.0000 "application " | TOPICAL_CREAM | Freq: Three times a day (TID) | CUTANEOUS | Status: DC | PRN
  Filled 2018-02-04: qty 28

## 2018-02-04 MED ORDER — LACTATED RINGERS IV BOLUS: 1000.0000 mL | Freq: Three times a day (TID) | INTRAVENOUS | Status: DC | PRN

## 2018-02-04 MED ORDER — ACETAMINOPHEN 500 MG PO TABS
1000.0000 mg | ORAL_TABLET | Freq: Three times a day (TID) | ORAL | Status: DC
  Administered 2018-02-04 – 2018-02-06 (×7): 1000 mg via ORAL
  Filled 2018-02-04 (×7): qty 2

## 2018-02-04 MED ORDER — METFORMIN HCL 500 MG PO TABS
1000.0000 mg | ORAL_TABLET | Freq: Two times a day (BID) | ORAL | Status: DC
  Administered 2018-02-04 – 2018-02-06 (×4): 1000 mg via ORAL
  Filled 2018-02-04 (×4): qty 2

## 2018-02-04 NOTE — Progress Notes (Signed)
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  Resume oral hypoglycemics and follow Follow-up on pathology -VTE prophylaxis- SCDs, etc -mobilize as tolerated to help recovery  20 minutes spent in review, evaluation, examination, counseling, and coordination of care.  More than 50% of that time was spent in counseling.  02/04/2018    Subjective: (Chief complaint)  Trying some soft food.  Up in chair.  Still rather sore.  Walked a little bit.  Objective:  Vital signs:  Vitals:   02/03/18 2152 02/04/18 0600 02/04/18 0657 02/04/18 0658  BP:   122/73 122/73  Pulse:   69 75  Resp:   18 18  Temp:   98 F (36.7 C) 98 F (36.7 C)  TempSrc:   Oral Oral  SpO2:   97% 97%  Weight: 84.6 kg (186 lb 8.2 oz) 84.6 kg (186 lb 8.2 oz)    Height:        Last BM Date: 02/01/18  Intake/Output   Yesterday:  07/03 0701 - 07/04 0700 In: 4026.7 [P.O.:1160; I.V.:2866.7] Out: 2400 [Urine:2400] This shift:  Total I/O In: 240 [P.O.:240] Out: 600 [Urine:600]  Bowel function:  Flatus: No  BM:  No  Drain: (No drain)   Physical Exam:  General: Pt awake/alert/oriented x4 in no acute distress Eyes: PERRL, normal EOM.  Sclera clear.  No icterus Neuro: CN II-XII  intact w/o focal sensory/motor deficits. Lymph: No head/neck/groin lymphadenopathy Psych:  No delerium/psychosis/paranoia HENT: Normocephalic, Mucus membranes moist.  No thrush Neck: Supple, No tracheal deviation Chest: No chest wall pain w good excursion CV:  Pulses intact.  Regular rhythm MS: Normal AROM mjr joints.  No obvious deformity  Abdomen: Soft.  Mildy distended.  Mildly tender at incisions only.  No evidence of peritonitis.  No incarcerated hernias.  Ext:  No deformity.  No mjr edema.  No cyanosis Skin: No petechiae / purpura  Results:   Labs: Results for orders placed or performed during the hospital encounter of 02/02/18 (from the past 48 hour(s))  Glucose, capillary     Status: Abnormal   Collection Time: 02/02/18 12:48 PM  Result Value Ref Range   Glucose-Capillary 172 (H) 70 - 99 mg/dL   Comment 1 Notify RN    Comment 2 Document in Chart   Glucose, capillary     Status: Abnormal   Collection Time: 02/02/18  4:45 PM  Result Value Ref Range   Glucose-Capillary 194 (H) 70 - 99 mg/dL   Comment 1 Document in Chart   Glucose, capillary     Status: Abnormal   Collection Time: 02/02/18  5:55 PM  Result Value Ref Range   Glucose-Capillary 187 (H) 70 - 99 mg/dL  CBC     Status: Abnormal   Collection Time: 02/02/18  7:13 PM  Result Value Ref Range  WBC 21.5 (H) 4.0 - 10.5 K/uL   RBC 4.28 4.22 - 5.81 MIL/uL   Hemoglobin 9.5 (L) 13.0 - 17.0 g/dL   HCT 30.7 (L) 39.0 - 52.0 %   MCV 71.7 (L) 78.0 - 100.0 fL   MCH 22.2 (L) 26.0 - 34.0 pg   MCHC 30.9 30.0 - 36.0 g/dL   RDW 15.6 (H) 11.5 - 15.5 %   Platelets 420 (H) 150 - 400 K/uL    Comment: Performed at [facility], [place] [address], [place], [place] [address]  Creatinine, serum     Status: None   Collection Time: 02/02/18  7:13 PM  Result Value Ref Range   Creatinine, Ser 0.91 0.61 - 1.24 mg/dL   GFR calc non Af Amer >60 >60 mL/min   GFR calc Af Amer >60 >60 mL/min    Comment: (NOTE) The eGFR  has been calculated using the CKD EPI equation. This calculation has not been validated in all clinical situations. eGFR's persistently <60 mL/min signify possible Chronic Kidney Disease. Performed at [facility], [place] [address], [place], [place] [address]   Basic metabolic panel     Status: Abnormal   Collection Time: 02/03/18  5:17 AM  Result Value Ref Range   Sodium 139 135 - 145 mmol/L   Potassium 4.2 3.5 - 5.1 mmol/L   Chloride 109 98 - 111 mmol/L    Comment: Please note change in reference range.   CO2 19 (L) 22 - 32 mmol/L   Glucose, Bld 286 (H) 70 - 99 mg/dL    Comment: Please note change in reference range.   BUN 15 8 - 23 mg/dL    Comment: Please note change in reference range.   Creatinine, Ser 0.94 0.61 - 1.24 mg/dL   Calcium 8.2 (L) 8.9 - 10.3 mg/dL   GFR calc non Af Amer >60 >60 mL/min   GFR calc Af Amer >60 >60 mL/min    Comment: (NOTE) The eGFR has been calculated using the CKD EPI equation. This calculation has not been validated in all clinical situations. eGFR's persistently <60 mL/min signify possible Chronic Kidney Disease.    Anion gap 11 5 - 15    Comment: Performed at [facility], [place] [address] [place], [place] [address]  CBC     Status: Abnormal   Collection Time: 02/03/18  5:17 AM  Result Value Ref Range   WBC 18.0 (H) 4.0 - 10.5 K/uL   RBC 4.12 (L) 4.22 - 5.81 MIL/uL   Hemoglobin 9.0 (L) 13.0 - 17.0 g/dL   HCT 29.6 (L) 39.0 - 52.0 %   MCV 71.8 (L) 78.0 - 100.0 fL   MCH 21.8 (L) 26.0 - 34.0 pg   MCHC 30.4 30.0 - 36.0 g/dL   RDW 15.6 (H) 11.5 - 15.5 %   Platelets 366 150 - 400 K/uL    Comment: Performed at [facility], [place] [address], [place], [place] [address]  Glucose, capillary     Status: Abnormal   Collection Time: 02/03/18  5:44 AM  Result Value Ref Range   Glucose-Capillary 259 (H) 70 - 99 mg/dL  Glucose, capillary     Status: Abnormal   Collection Time: 02/03/18  8:00 AM    Result Value Ref Range   Glucose-Capillary 243 (H) 70 - 99 mg/dL  Glucose, capillary     Status: Abnormal   Collection Time: 02/03/18 11:41 AM  Result Value Ref Range   Glucose-Capillary 255 (  H) 70 - 99 mg/dL  Glucose, capillary     Status: Abnormal   Collection Time: 02/03/18  4:27 PM  Result Value Ref Range   Glucose-Capillary 282 (H) 70 - 99 mg/dL  Glucose, capillary     Status: Abnormal   Collection Time: 02/03/18  9:22 PM  Result Value Ref Range   Glucose-Capillary 249 (H) 70 - 99 mg/dL  Basic metabolic panel     Status: Abnormal   Collection Time: 02/04/18  5:35 AM  Result Value Ref Range   Sodium 139 135 - 145 mmol/L   Potassium 5.0 3.5 - 5.1 mmol/L   Chloride 108 98 - 111 mmol/L    Comment: Please note change in reference range.   CO2 26 22 - 32 mmol/L   Glucose, Bld 270 (H) 70 - 99 mg/dL    Comment: Please note change in reference range.   BUN 19 8 - 23 mg/dL    Comment: Please note change in reference range.   Creatinine, Ser 0.98 0.61 - 1.24 mg/dL   Calcium 8.0 (L) 8.9 - 10.3 mg/dL   GFR calc non Af Amer >60 >60 mL/min   GFR calc Af Amer >60 >60 mL/min    Comment: (NOTE) The eGFR has been calculated using the CKD EPI equation. This calculation has not been validated in all clinical situations. eGFR's persistently <60 mL/min signify possible Chronic Kidney Disease.    Anion gap 5 5 - 15    Comment: Performed at [facility], [place] [address], [place], [place] [address]  CBC     Status: Abnormal   Collection Time: 02/04/18  5:35 AM  Result Value Ref Range   WBC 11.9 (H) 4.0 - 10.5 K/uL   RBC 3.87 (L) 4.22 - 5.81 MIL/uL   Hemoglobin 8.5 (L) 13.0 - 17.0 g/dL   HCT 27.4 (L) 39.0 - 52.0 %   MCV 70.8 (L) 78.0 - 100.0 fL   MCH 22.0 (L) 26.0 - 34.0 pg   MCHC 31.0 30.0 - 36.0 g/dL   RDW 15.9 (H) 11.5 - 15.5 %   Platelets 374 150 - 400 K/uL    Comment: Performed at [facility], [place] [address], [place], [place] [address]   Glucose, capillary     Status: Abnormal   Collection Time: 02/04/18  7:49 AM  Result Value Ref Range   Glucose-Capillary 268 (H) 70 - 99 mg/dL    Imaging / Studies: No results found.  Medications / Allergies: per chart  Antibiotics: Anti-infectives (From admission, onward)   Start     Dose/Rate Route Frequency Ordered Stop   02/03/18 0600  cefoTEtan (CEFOTAN) 2 g in sodium chloride 0.9 % 100 mL IVPB     2 g 200 mL/hr over 30 Minutes Intravenous On call to O.R. 02/02/18 1252 02/02/18 1530   02/03/18 0200  cefoTEtan (CEFOTAN) 2 g in sodium chloride 0.9 % 100 mL IVPB     2 g 200 mL/hr over 30 Minutes Intravenous Every 12 hours 02/02/18 1854 02/03/18 0300   02/02/18 1300  clindamycin (CLEOCIN) 900 mg, gentamicin (GARAMYCIN) 240 mg in sodium chloride 0.9 % 1,000 mL for intraperitoneal lavage  Status:  Discontinued      Intraperitoneal To Surgery 02/02/18 1252 02/02/18 1845        Note: Portions of this report may have been transcribed using voice recognition software. Every effort was made to ensure accuracy; however, inadvertent computerized transcription errors may be present.   Any transcriptional  errors that result from this process are unintentional.     Adin Hector, MD, FACS, MASCRS Gastrointestinal and Minimally Invasive Surgery    [address] [place], [place] [address] [phone] Main / Paging [phone] Fax

## 2018-02-04 NOTE — Discharge Instructions (Signed)
SURGERY: POST OP INSTRUCTIONS °(Surgery for small bowel obstruction, colon resection, etc) ° ° °###################################################################### ° °EAT °Gradually transition to a high fiber diet with a fiber supplement over the next few days after discharge ° °WALK °Walk an hour a day.  Control your pain to do that.   ° °CONTROL PAIN °Control pain so that you can walk, sleep, tolerate sneezing/coughing, go up/down stairs. ° °HAVE A BOWEL MOVEMENT DAILY °Keep your bowels regular to avoid problems.  OK to try a laxative to override constipation.  OK to use an antidairrheal to slow down diarrhea.  Call if not better after 2 tries ° °CALL IF YOU HAVE PROBLEMS/CONCERNS °Call if you are still struggling despite following these instructions. °Call if you have concerns not answered by these instructions ° °###################################################################### ° ° °DIET °Follow a light diet the first few days at home.  Start with a bland diet such as soups, liquids, starchy foods, low fat foods, etc.  If you feel full, bloated, or constipated, stay on a ful liquid or pureed/blenderized diet for a few days until you feel better and no longer constipated. °Be sure to drink plenty of fluids every day to avoid getting dehydrated (feeling dizzy, not urinating, etc.). °Gradually add a fiber supplement to your diet over the next week.  Gradually get back to a regular solid diet.  Avoid fast food or heavy meals the first week as you are more likely to get nauseated. °It is expected for your digestive tract to need a few months to get back to normal.  It is common for your bowel movements and stools to be irregular.  You will have occasional bloating and cramping that should eventually fade away.  Until you are eating solid food normally, off all pain medications, and back to regular activities; your bowels will not be normal. °Focus on eating a low-fat, high fiber diet the rest of your life  (See Getting to Good Bowel Health, below). ° °CARE of your INCISION or WOUND °It is good for closed incision and even open wounds to be washed every day.  Shower every day.  Short baths are fine.  Wash the incisions and wounds clean with soap & water.    °If you have a closed incision(s), wash the incision with soap & water every day.  You may leave closed incisions open to air if it is dry.   You may cover the incision with clean gauze & replace it after your daily shower for comfort. °If you have skin tapes (Steristrips) or skin glue (Dermabond) on your incision, leave them in place.  They will fall off on their own like a scab.  You may trim any edges that curl up with clean scissors.  If you have staples, set up an appointment for them to be removed in the office in 10 days after surgery.  °If you have a drain, wash around the skin exit site with soap & water and place a new dressing of gauze or band aid around the skin every day.  Keep the drain site clean & dry.    °If you have an open wound with packing, see wound care instructions.  In general, it is encouraged that you remove your dressing and packing, shower with soap & water, and replace your dressing once a day.  Pack the wound with clean gauze moistened with normal (0.9%) saline to keep the wound moist & uninfected.  Pressure on the dressing for 30 minutes will stop most wound   bleeding.  Eventually your body will heal & pull the open wound closed over the next few months.  °Raw open wounds will occasionally bleed or secrete yellow drainage until it heals closed.  Drain sites will drain a little until the drain is removed.  Even closed incisions can have mild bleeding or drainage the first few days until the skin edges scab over & seal.   °If you have an open wound with a wound vac, see wound vac care instructions. ° ° ° ° °ACTIVITIES as tolerated °Start light daily activities --- self-care, walking, climbing stairs-- beginning the day after surgery.   Gradually increase activities as tolerated.  Control your pain to be active.  Stop when you are tired.  Ideally, walk several times a day, eventually an hour a day.   °Most people are back to most day-to-day activities in a few weeks.  It takes 4-8 weeks to get back to unrestricted, intense activity. °If you can walk 30 minutes without difficulty, it is safe to try more intense activity such as jogging, treadmill, bicycling, low-impact aerobics, swimming, etc. °Save the most intensive and strenuous activity for last (Usually 4-8 weeks after surgery) such as sit-ups, heavy lifting, contact sports, etc.  Refrain from any intense heavy lifting or straining until you are off narcotics for pain control.  You will have off days, but things should improve week-by-week. °DO NOT PUSH THROUGH PAIN.  Let pain be your guide: If it hurts to do something, don't do it.  Pain is your body warning you to avoid that activity for another week until the pain goes down. °You may drive when you are no longer taking narcotic prescription pain medication, you can comfortably wear a seatbelt, and you can safely make sudden turns/stops to protect yourself without hesitating due to pain. °You may have sexual intercourse when it is comfortable. If it hurts to do something, stop. ° °MEDICATIONS °Take your usually prescribed home medications unless otherwise directed.   °Blood thinners:  °Usually you can restart any strong blood thinners after the second postoperative day.  It is OK to take aspirin right away.    ° If you are on strong blood thinners (warfarin/Coumadin, Plavix, Xerelto, Eliquis, Pradaxa, etc), discuss with your surgeon, medicine PCP, and/or cardiologist for instructions on when to restart the blood thinner & if blood monitoring is needed (PT/INR blood check, etc).   ° ° °PAIN CONTROL °Pain after surgery or related to activity is often due to strain/injury to muscle, tendon, nerves and/or incisions.  This pain is usually  short-term and will improve in a few months.  °To help speed the process of healing and to get back to regular activity more quickly, DO THE FOLLOWING THINGS TOGETHER: °1. Increase activity gradually.  DO NOT PUSH THROUGH PAIN °2. Use Ice and/or Heat °3. Try Gentle Massage and/or Stretching °4. Take over the counter pain medication °5. Take Narcotic prescription pain medication for more severe pain ° °Good pain control = faster recovery.  It is better to take more medicine to be more active than to stay in bed all day to avoid medications. °1.  Increase activity gradually °Avoid heavy lifting at first, then increase to lifting as tolerated over the next 6 weeks. °Do not “push through” the pain.  Listen to your body and avoid positions and maneuvers than reproduce the pain.  Wait a few days before trying something more intense °Walking an hour a day is encouraged to help your body recover faster   and more safely.  Start slowly and stop when getting sore.  If you can walk 30 minutes without stopping or pain, you can try more intense activity (running, jogging, aerobics, cycling, swimming, treadmill, sex, sports, weightlifting, etc.) °Remember: If it hurts to do it, then don’t do it! °2. Use Ice and/or Heat °You will have swelling and bruising around the incisions.  This will take several weeks to resolve. °Ice packs or heating pads (6-8 times a day, 30-60 minutes at a time) will help sooth soreness & bruising. °Some people prefer to use ice alone, heat alone, or alternate between ice & heat.  Experiment and see what works best for you.  Consider trying ice for the first few days to help decrease swelling and bruising; then, switch to heat to help relax sore spots and speed recovery. °Shower every day.  Short baths are fine.  It feels good!  Keep the incisions and wounds clean with soap & water.   °3. Try Gentle Massage and/or Stretching °Massage at the area of pain many times a day °Stop if you feel pain - do not  overdo it °4. Take over the counter pain medication °This helps the muscle and nerve tissues become less irritable and calm down faster °Choose ONE of the following over-the-counter anti-inflammatory medications: °Acetaminophen 500mg tabs (Tylenol) 1-2 pills with every meal and just before bedtime (avoid if you have liver problems or if you have acetaminophen in you narcotic prescription) °Naproxen 220mg tabs (ex. Aleve, Naprosyn) 1-2 pills twice a day (avoid if you have kidney, stomach, IBD, or bleeding problems) °Ibuprofen 200mg tabs (ex. Advil, Motrin) 3-4 pills with every meal and just before bedtime (avoid if you have kidney, stomach, IBD, or bleeding problems) °Take with food/snack several times a day as directed for at least 2 weeks to help keep pain / soreness down & more manageable. °5. Take Narcotic prescription pain medication for more severe pain °A prescription for strong pain control is often given to you upon discharge (for example: oxycodone/Percocet, hydrocodone/Norco/Vicodin, or tramadol/Ultram) °Take your pain medication as prescribed. °Be mindful that most narcotic prescriptions contain Tylenol (acetaminophen) as well - avoid taking too much Tylenol. °If you are having problems/concerns with the prescription medicine (does not control pain, nausea, vomiting, rash, itching, etc.), please call us [phone] to see if we need to switch you to a different pain medicine that will work better for you and/or control your side effects better. °If you need a refill on your pain medication, you must call the office before 4 pm and on weekdays only.  By federal law, prescriptions for narcotics cannot be called into a pharmacy.  They must be filled out on paper & picked up from our office by the patient or authorized caretaker.  Prescriptions cannot be filled after 4 pm nor on weekends.   ° °WHEN TO CALL US [phone] °Severe uncontrolled or worsening pain  °Fever over 101 F (38.5 C) °Concerns with  the incision: Worsening pain, redness, rash/hives, swelling, bleeding, or drainage °Reactions / problems with new medications (itching, rash, hives, nausea, etc.) °Nausea and/or vomiting °Difficulty urinating °Difficulty breathing °Worsening fatigue, dizziness, lightheadedness, blurred vision °Other concerns °If you are not getting better after two weeks or are noticing you are getting worse, contact our office [phone] for further advice.  We may need to adjust your medications, re-evaluate you in the office, send you to the emergency room, or see what other things we can do to help. °The   clinic staff is available to answer your questions during regular business hours (8:30am-5pm).  Please don’t hesitate to call and ask to speak to one of our nurses for clinical concerns.    °A surgeon from [facility] is always on call at the hospitals 24 hours/day °If you have a medical emergency, go to the nearest emergency room or call 911. ° °FOLLOW UP in our office °One the day of your discharge from the hospital (or the next business weekday), please call [facility] to set up or confirm an appointment to see your surgeon in the office for a follow-up appointment.  Usually it is 2-3 weeks after your surgery.   °If you have skin staples at your incision(s), let the office know so we can set up a time in the office for the nurse to remove them (usually around 10 days after surgery). °Make sure that you call for appointments the day of discharge (or the next business weekday) from the hospital to ensure a convenient appointment time. °IF YOU HAVE DISABILITY OR FAMILY LEAVE FORMS, BRING THEM TO THE OFFICE FOR PROCESSING.  DO NOT GIVE THEM TO YOUR DOCTOR. ° °[facility] °[address], [address], [place], [place]  [address] ? °[phone] - Main °[phone] - Toll Free,  [phone] - Fax °[email or web address] ° °GETTING TO GOOD BOWEL HEALTH. °It is  expected for your digestive tract to need a few months to get back to normal.  It is common for your bowel movements and stools to be irregular.  You will have occasional bloating and cramping that should eventually fade away.  Until you are eating solid food normally, off all pain medications, and back to regular activities; your bowels will not be normal.   °Avoiding constipation °The goal: ONE SOFT BOWEL MOVEMENT A DAY!    °Drink plenty of fluids.  Choose water first. °TAKE A FIBER SUPPLEMENT EVERY DAY THE REST OF YOUR LIFE °During your first week back home, gradually add back a fiber supplement every day °Experiment which form you can tolerate.   There are many forms such as powders, tablets, wafers, gummies, etc °Psyllium bran (Metamucil), methylcellulose (Citrucel), Miralax or Glycolax, Benefiber, Flax Seed.  °Adjust the dose week-by-week (1/2 dose/day to 6 doses a day) until you are moving your bowels 1-2 times a day.  Cut back the dose or try a different fiber product if it is giving you problems such as diarrhea or bloating. °Sometimes a laxative is needed to help jump-start bowels if constipated until the fiber supplement can help regulate your bowels.  If you are tolerating eating & you are farting, it is okay to try a gentle laxative such as double dose MiraLax, prune juice, or Milk of Magnesia.  Avoid using laxatives too often. °Stool softeners can sometimes help counteract the constipating effects of narcotic pain medicines.  It can also cause diarrhea, so avoid using for too long. °If you are still constipated despite taking fiber daily, eating solids, and a few doses of laxatives, call our office. °Controlling diarrhea °Try drinking liquids and eating bland foods for a few days to avoid stressing your intestines further. °Avoid dairy products (especially milk & ice cream) for a short time.  The intestines often can lose the ability to digest lactose when stressed. °Avoid foods that cause gassiness or  bloating.  Typical foods include beans and other legumes, cabbage, broccoli, and dairy foods.  Avoid greasy, spicy, fast foods.  Every person has   some sensitivity to other foods, so listen to your body and avoid those foods that trigger problems for you. °Probiotics (such as active yogurt, Align, etc) may help repopulate the intestines and colon with normal bacteria and calm down a sensitive digestive tract °Adding a fiber supplement gradually can help thicken stools by absorbing excess fluid and retrain the intestines to act more normally.  Slowly increase the dose over a few weeks.  Too much fiber too soon can backfire and cause cramping & bloating. °It is okay to try and slow down diarrhea with a few doses of antidiarrheal medicines.   °Bismuth subsalicylate (ex. Kayopectate, Pepto Bismol) for a few doses can help control diarrhea.  Avoid if pregnant.   °Loperamide (Imodium) can slow down diarrhea.  Start with one tablet (2mg) first.  Avoid if you are having fevers or severe pain.  °ILEOSTOMY PATIENTS WILL HAVE CHRONIC DIARRHEA since their colon is not in use.    °Drink plenty of liquids.  You will need to drink even more glasses of water/liquid a day to avoid getting dehydrated. °Record output from your ileostomy.  Expect to empty the bag every 3-4 hours at first.  Most people with a permanent ileostomy empty their bag 4-6 times at the least.   °Use antidiarrheal medicine (especially Imodium) several times a day to avoid getting dehydrated.  Start with a dose at bedtime & breakfast.  Adjust up or down as needed.  Increase antidiarrheal medications as directed to avoid emptying the bag more than 8 times a day (every 3 hours). °Work with your wound ostomy nurse to learn care for your ostomy.  See ostomy care instructions. °TROUBLESHOOTING IRREGULAR BOWELS °1) Start with a soft & bland diet. No spicy, greasy, or fried foods.  °2) Avoid gluten/wheat or dairy products from diet to see if symptoms improve. °3) Miralax  17gm or flax seed mixed in 8oz. water or juice-daily. May use 2-4 times a day as needed. °4) Gas-X, Phazyme, etc. as needed for gas & bloating.  °5) Prilosec (omeprazole) over-the-counter as needed °6)  Consider probiotics (Align, Activa, etc) to help calm the bowels down ° °Call your doctor if you are getting worse or not getting better.  Sometimes further testing (cultures, endoscopy, X-ray studies, CT scans, bloodwork, etc.) may be needed to help diagnose and treat the cause of the diarrhea. °[facility] °[address], [address], [place], [place]  [address] °[phone] - Main.    °[phone]  - Toll Free.   [phone] - Fax °[email or web address] ° ° ° °Colorectal Cancer °Colorectal cancer is an abnormal growth of cells and tissue (tumor) in the colon or rectum, which are parts of the large intestine. The cancer can spread (metastasize) to other parts of the body. °What are the causes? °Most cases of colorectal cancer start as abnormal growths called polyps on the inner wall of the colon or rectum. Other times, abnormal changes to genes (genetic mutations) can cause cells to form cancer. °What increases the risk? °You are more likely to develop this condition if: °· You are older than age 50. °· You have multiple polyps in the colon or rectum. °· You have diabetes. °· You are African American. °· You have a family history of Lynch syndrome. °· You have had cancer before. °· You have certain hereditary conditions, such as: °? Familial adenomatous polyposis. °? Turcot syndrome. °? Peutz-Jeghers syndrome. °· You eat a diet that is high in fat (especially animal fat) and   low in fiber, fruits, and vegetables. °· You have an inactive (sedentary) lifestyle. °· You have an inflammatory bowel disease or Crohn disease. °· You smoke. °· You drink alcohol excessively. ° °What are the signs or symptoms? °Early colorectal cancer often does not cause symptoms. As the cancer grows,  symptoms may include: °· Changes in bowel habits. °· Feeling like the bowel does not empty completely after a bowel movement. °· Stools that are narrower than usual. °· Blood in the stool. °· Diarrhea. °· Constipation. °· Anemia. °· Discomfort, pain, bloating, fullness, or cramps in the abdomen. °· Frequent gas pain. °· Unexplained weight loss. °· Constant fatigue. °· Nausea and vomiting. ° °How is this diagnosed? °This condition may be diagnosed with: °· A medical history. °· A physical exam. °· Tests. These may include: °? A digital rectal exam. °? A stool test called a fecal occult blood test. °? Blood tests. °? A test in which a tissue sample is taken from the colon or rectum and examined under a microscope (biopsy). °? Imaging tests, such as: °§ X-rays. °§ A barium enema. °§ CT scans. °§ MRIs. °§ A sigmoidoscopy. This test is done to view the inside of the rectum. °§ A colonoscopy. This test is done to view the inside of the colon. During this test, small polyps can be removed or biopsies may be taken. °§ An endorectal ultrasound. This test checks how deep a tumor in the rectum has grown and whether the cancer has spread to lymph nodes or other nearby tissues. ° °Your health care provider may order additional tests to find out whether the cancer has spread to other parts of the body (what stage it is). The stages of cancer include: °· Stage 0. At this stage, the cancer is found only in the innermost lining of the colon or rectum. °· Stage I. At this stage, the cancer has grown into the inner wall of the colon or rectum. °· Stage II. At this stage, the cancer has gone more deeply into the wall of the colon or rectum or through the wall. It may have invaded nearby tissue. °· Stage III. At this stage, the cancer has spread to nearby lymph nodes. °· Stage IV. At this stage, the cancer has spread to other parts of the body, such as the liver or lungs. ° °How is this treated? °Treatment for this condition depends on  the type and stage of the cancer. Treatment may include: °· Surgery. In the early stages of the cancer, surgery may be done to remove polyps or small tumors from the colon. In later stages, surgery may be done to remove part of the colon. °· Chemotherapy. This treatment uses medicines to kill cancer cells. °· Targeted therapy. This treatment targets specific gene mutations or proteins that the cancer expresses in order to kill tumor cells. °· Radiation therapy. This treatment uses radiation to kill cancer cells or shrink tumors. °· Radiofrequency ablation. This treatment uses radio waves to destroy the tumors that may have spread to other areas of the body, such as the liver. ° °Follow these instructions at home: °· Take over-the-counter and prescription medicines only as told by your health care provider. °· Try to eat regular, healthy meals. Some of your treatments might affect your appetite. If you are having problems eating or with your appetite, ask to meet with a food and nutrition specialist (dietitian). °· Consider joining a support group. This may help you learn about your diagnosis and cope   with the stress of having colorectal cancer. °· If you are admitted to the hospital, inform your cancer care team. °· Keep all follow-up visits as told by your health care provider. This is important. °How is this prevented? °· Colorectal cancer can be prevented with screening tests that find polyps so they can be removed before they develop into cancer. °· Most people with average risk of colorectal cancer should start screening at age 50. People at increased risk should start screening at an earlier age. °Where to find more information: °· American Cancer Society: https://www.cancer.org °· National Cancer Institute (NCI): https://www.cancer.gov °Contact a health care provider if: °· Your diarrhea or constipation does not go away. °· You have blood in your stool. °· Your bowel habits change. °· You have increased pain  in your abdomen. °· You notice new fatigue or weakness. °· You lose weight. °Get help right away if: °· You have increased bleeding from your rectum. °· You have any uncontrollable or severe abdomen (abdominal) symptoms. °Summary °· Colorectal cancer is an abnormal growth of cells and tissue (tumor) in the colon or rectum. °· Common risk factors for this condition include having a relative with colon cancer, being older in age, having an inflammatory bowel disease, and being African American. °· This condition may be diagnosed with tests, such as a colonoscopy and biopsy. °· Treatment depends on the type and stage of the cancer. Commonly, treatment includes surgery to remove the tumor along with chemotherapy or targeted therapy. °· Keep all follow-up visits as told by your health care provider. This is important. °This information is not intended to replace advice given to you by your health care provider. Make sure you discuss any questions you have with your health care provider. °Document Released: 07/21/2005 Document Revised: 08/22/2016 Document Reviewed: 08/22/2016 °Elsevier Interactive Patient Education © 2018 [employer] ° °

## 2018-02-05 LAB — BASIC METABOLIC PANEL
Anion gap: 7 (ref 5–15)
BUN: 18 mg/dL (ref 8–23)
CHLORIDE: 109 mmol/L (ref 98–111)
CO2: 26 mmol/L (ref 22–32)
CREATININE: 0.67 mg/dL (ref 0.61–1.24)
Calcium: 8.2 mg/dL — ABNORMAL LOW (ref 8.9–10.3)
GFR calc Af Amer: >60 mL/min (ref >60)
GFR calc non Af Amer: >60 mL/min (ref >60)
GLUCOSE: 178 mg/dL — AB (ref 70–99)
Potassium: 4 mmol/L (ref 3.5–5.1)
SODIUM: 142 mmol/L (ref 135–145)

## 2018-02-05 LAB — CBC
HEMATOCRIT: 28.8 % — AB (ref 39.0–52.0)
Hemoglobin: 9 g/dL — ABNORMAL LOW (ref 13.0–17.0)
MCH: 22.1 pg — AB (ref 26.0–34.0)
MCHC: 31.3 g/dL (ref 30.0–36.0)
MCV: 70.8 fL — ABNORMAL LOW (ref 78.0–100.0)
Platelets: 443 10*3/uL — ABNORMAL HIGH (ref 150–400)
RBC: 4.07 MIL/uL — ABNORMAL LOW (ref 4.22–5.81)
RDW: 15.9 % — AB (ref 11.5–15.5)
WBC: 8.5 10*3/uL (ref 4.0–10.5)

## 2018-02-05 LAB — GLUCOSE, CAPILLARY
Glucose-Capillary: 178 mg/dL — ABNORMAL HIGH (ref 70–99)
Glucose-Capillary: 240 mg/dL — ABNORMAL HIGH (ref 70–99)
Glucose-Capillary: 214 mg/dL — ABNORMAL HIGH (ref 70–99)
Glucose-Capillary: 222 mg/dL — ABNORMAL HIGH (ref 70–99)

## 2018-02-05 MED ORDER — INSULIN GLARGINE 100 UNIT/ML ~~LOC~~ SOLN
12.0000 [IU] | Freq: Every day | SUBCUTANEOUS | Status: DC
  Administered 2018-02-05: 12 [IU] via SUBCUTANEOUS
  Filled 2018-02-05 (×2): qty 0.12

## 2018-02-05 MED ORDER — IBUPROFEN 400 MG PO TABS
600.0000 mg | ORAL_TABLET | Freq: Four times a day (QID) | ORAL | Status: DC | PRN
  Administered 2018-02-05 (×3): 600 mg via ORAL
  Filled 2018-02-05 (×3): qty 1

## 2018-02-05 MED ORDER — GABAPENTIN 300 MG PO CAPS
300.0000 mg | ORAL_CAPSULE | Freq: Three times a day (TID) | ORAL | Status: DC
  Administered 2018-02-05 – 2018-02-06 (×4): 300 mg via ORAL
  Filled 2018-02-05 (×4): qty 1

## 2018-02-05 NOTE — Plan of Care (Signed)
Plan of care discussed.   

## 2018-02-05 NOTE — Progress Notes (Signed)
Pharmacy Brief Note - Alvimopan (Entereg)  The standing order set for alvimopan (Entereg) now includes an automatic order to discontinue the drug after the patient has had a bowel movement. The change was approved by the [facility] and the Medical Executive Committee.   This patient has had bowel movements documented by nursing. Therefore, alvimopan has been discontinued. If there are questions, please contact the pharmacy at [phone].   Thank you-  Peggyann Juba, PharmD, BCPS Pager: [phone] 02/05/2018 8:46 AM

## 2018-02-05 NOTE — Progress Notes (Signed)
Subjective Complaining of incisional pain. Eating about half of his trays. Had 3 small BMs yesterday. Denies emesis. Ambulating  Objective: Vital signs in last 24 hours: Temp:  [97.6 F (36.4 C)-99 F (37.2 C)] 97.6 F (36.4 C) (07/05 0510) Pulse Rate:  [76-77] 76 (07/05 0510) Resp:  [17-18] 17 (07/05 0510) BP: (120-148)/(64-77) 148/77 (07/05 0510) SpO2:  [96 %-98 %] 96 % (07/05 0510) Weight:  [84.4 kg (186 lb)] 84.4 kg (186 lb) (07/05 0510) Last BM Date: 02/04/18  Intake/Output from previous day: 07/04 0701 - 07/05 0700 In: 1380 [P.O.:1380] Out: 600 [Urine:600] Intake/Output this shift: No intake/output data recorded.  Gen: NAD, comfortable CV: RRR Pulm: Normal work of breathing Abd: Soft, mildly tender to palpation around right abdomen, mildly distended Ext: SCDs in place  Lab Results: CBC  Recent Labs    02/04/18 0535 02/05/18 0528  WBC 11.9* 8.5  HGB 8.5* 9.0*  HCT 27.4* 28.8*  PLT 374 443*   BMET Recent Labs    02/04/18 0535 02/05/18 0528  NA 139 142  K 5.0 4.0  CL 108 109  CO2 26 26  GLUCOSE 270* 178*  BUN 19 18  CREATININE 0.98 0.67  CALCIUM 8.0* 8.2*   PT/INR No results for input(s): LABPROT, INR in the last 72 hours. ABG No results for input(s): PHART, HCO3 in the last 72 hours.  Invalid input(s): PCO2, PO2  Studies/Results:  Anti-infectives: Anti-infectives (From admission, onward)   Start     Dose/Rate Route Frequency Ordered Stop   02/03/18 0600  cefoTEtan (CEFOTAN) 2 g in sodium chloride 0.9 % 100 mL IVPB     2 g 200 mL/hr over 30 Minutes Intravenous On call to O.R. 02/02/18 1252 02/02/18 1530   02/03/18 0200  cefoTEtan (CEFOTAN) 2 g in sodium chloride 0.9 % 100 mL IVPB     2 g 200 mL/hr over 30 Minutes Intravenous Every 12 hours 02/02/18 1854 02/03/18 0300   02/02/18 1300  clindamycin (CLEOCIN) 900 mg, gentamicin (GARAMYCIN) 240 mg in sodium chloride 0.9 % 1,000 mL for intraperitoneal lavage  Status:  Discontinued     Intraperitoneal To Surgery 02/02/18 1252 02/02/18 1845       Assessment/Plan: Patient Active Problem List   Diagnosis Date Noted  . 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 Generalized weakness 04/11/2015  . Acute purulent bronchitis 04/11/2015  . Dehydration 04/11/2015  . Nocturia more than twice per night 01/16/2015  . Snoring 01/16/2015  . 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 Obese abdomen 01/16/2015  . Other fatigue 01/16/2015  . Type 2 diabetes mellitus without complication ([place]) 29/79/8921  . Insomnia    s/p Procedure(s): LAPAROSCOPIC ASCENDING COLECTOMY 02/02/2018  -Doesn't feel comfortable going home today due to right abdominal discomfort -I encouraged him to keep ambulating -Increased gabapentin to TID -Starting ibuprofen -PPx:  Lov, SCDs -Dispo: possible discharge home tomorrow if doing well   LOS: 3 days   Sharon Mt. Dema Severin, M.D. [facility]  Note: This dictation was prepared with Dragon/digital dictation along with [employer]. Any transcriptional errors that result from this process are unintentional.

## 2018-02-06 LAB — GLUCOSE, CAPILLARY: Glucose-Capillary: 185 mg/dL — ABNORMAL HIGH (ref 70–99)

## 2018-02-06 MED ORDER — OXYCODONE HCL 5 MG PO TABS: 5.0000 mg | ORAL_TABLET | ORAL | 0 refills | Status: DC | PRN

## 2018-02-06 MED ORDER — GABAPENTIN 300 MG PO CAPS: 300.0000 mg | ORAL_CAPSULE | Freq: Three times a day (TID) | ORAL | 0 refills | Status: DC

## 2018-02-06 MED ORDER — IBUPROFEN 600 MG PO TABS: 600.0000 mg | ORAL_TABLET | Freq: Four times a day (QID) | ORAL | 0 refills | Status: DC | PRN

## 2018-02-06 MED ORDER — ACETAMINOPHEN 325 MG PO TABS: 650.0000 mg | ORAL_TABLET | Freq: Four times a day (QID) | ORAL | Status: DC | PRN

## 2018-02-06 NOTE — Discharge Summary (Signed)
Physician Discharge Summary  Patient ID: William Oneill MRN: [id number] DOB/AGE: [birth year] 72 y.o.  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 Barry Dienes. Over the next several days his diet was advanced and he began to have bowel movements. He was ambulating unassisted. Pain was well controlled. His vitals and labs were appropriate with the exception of hyperglycemia. On Post op day 4 he was deemed stable for discharge home.   Consults: None  Discharge Exam: Blood pressure (!) 147/73, pulse 77, temperature (!) 97.5 F (36.4 C), temperature source Oral, resp. rate 16, height _0  (1.702 m), weight 84.4 kg (186 lb), SpO2 100 %. General appearance: alert and cooperative Resp: clear to auscultation bilaterally GI: soft, minimally tender at incision, nondistended Extremities: extremities normal, atraumatic, no cyanosis or edema Skin: Skin color, texture, turgor normal. No rashes or lesions Incision/Wound: c/d/i with staples  Disposition:    Allergies as of 02/06/2018   No Known Allergies     Medication List    STOP taking these medications   benzonatate 100 MG capsule Commonly known as:  TESSALON   hydrOXYzine 25 MG tablet Commonly known as:  ATARAX/VISTARIL   ipratropium 0.03 % nasal spray Commonly known as:  ATROVENT   omeprazole 20 MG capsule Commonly known as:  PRILOSEC   permethrin 5 % cream Commonly known as:  ELIMITE     TAKE these medications   acetaminophen 325 MG tablet Commonly known as:  TYLENOL Take 2 tablets (650 mg total) by mouth every 6 (six) hours as needed.   aspirin EC 81 MG  tablet Take 81 mg by mouth daily.   atorvastatin 10 MG tablet Commonly known as:  LIPITOR Take 1 tablet (10 mg total) by mouth daily.   blood glucose meter kit and supplies Dispense based on patient and insurance preference. Use up to four times daily as directed. (FOR ICD-9 250.00, 250.01).   gabapentin 300 MG capsule Commonly known as:  NEURONTIN Take 1 capsule (300 mg total) by mouth 3 (three) times daily.   glipiZIDE 5 MG tablet Commonly known as:  GLUCOTROL Take 1 tablet (5 mg total) by mouth 2 (two) times daily before a meal.   glucose blood test strip Commonly known as:  ONE TOUCH ULTRA TEST USE AS DIRECTED TO TEST UP TO 4 TIMES A DAY   ibuprofen 600 MG tablet Commonly known as:  ADVIL,MOTRIN Take 1 tablet (600 mg total) by mouth every 6 (six) hours as needed (pain not controlled with tylenol).   metFORMIN 1000 MG tablet Commonly known as:  GLUCOPHAGE Take 1 tablet (1,000 mg total) by mouth 2 (two) times daily with a meal.   multivitamin with minerals Tabs tablet Take 1 tablet by mouth daily.   oxyCODONE 5 MG immediate release tablet Commonly known as:  Oxy IR/ROXICODONE Take 1 tablet (5 mg total) by mouth every 4 (four) hours as needed for moderate pain or severe pain.   sitaGLIPtin 100 MG tablet Commonly known as:  JANUVIA Take 1 tablet (100 mg total) by mouth daily.      Follow-up Information    Stark Klein, MD. Schedule an appointment as soon as possible for  a visit in 2 week(s).   Specialty:  General Surgery Contact information: [address] [place] [address] [phone]           Signed: [name] 02/06/2018, 6:15 AM

## 2018-02-06 NOTE — Plan of Care (Signed)
Pt alert and oriented, resting with minimal complaints of pain.  Ambulating in halls, tolerating diet, passing gas, and had BM this am.  Plan to d/c home today per MD order.

## 2018-02-09 ENCOUNTER — Telehealth: Payer: Self-pay | Admitting: General Surgery

## 2018-02-09 NOTE — Progress Notes (Signed)
I spoke to patient about positive LN.  Please make referral to oncology.

## 2018-02-09 NOTE — Telephone Encounter (Signed)
Discussed path with patient.  Will need oncology referral.

## 2018-02-16 ENCOUNTER — Telehealth: Payer: Self-pay | Admitting: Hematology

## 2018-02-16 NOTE — Telephone Encounter (Signed)
New referral received from Dr. Barry Dienes. Pt has been scheduled to see Dr. Burr Medico on 7/19 at 230pm. Pt aware to arrive 30 minutes early.

## 2018-02-19 ENCOUNTER — Inpatient Hospital Stay: Payer: Medicare Other | Attending: Hematology | Admitting: Hematology

## 2018-02-19 ENCOUNTER — Encounter: Payer: Self-pay | Admitting: Hematology

## 2018-02-19 ENCOUNTER — Telehealth: Payer: Self-pay | Admitting: Hematology

## 2018-02-19 ENCOUNTER — Other Ambulatory Visit: Payer: Self-pay | Admitting: General Surgery

## 2018-02-19 VITALS — BP 130/62 | HR 65 | Temp 98.9°F | Resp 18 | Ht 65.0 in | Wt 178.1 lb

## 2018-02-19 DIAGNOSIS — Z9049 Acquired absence of other specified parts of digestive tract: Secondary | ICD-10-CM | POA: Insufficient documentation

## 2018-02-19 DIAGNOSIS — Z9221 Personal history of antineoplastic chemotherapy: Secondary | ICD-10-CM

## 2018-02-19 DIAGNOSIS — C180 Malignant neoplasm of cecum: Secondary | ICD-10-CM

## 2018-02-19 MED ORDER — FERROUS SULFATE 325 (65 FE) MG PO TBEC: 325.0000 mg | DELAYED_RELEASE_TABLET | Freq: Three times a day (TID) | ORAL | 2 refills | Status: DC

## 2018-02-19 NOTE — Telephone Encounter (Signed)
Scheduled appt per 7/19 los - gave patient AVS and calender per los. 

## 2018-02-19 NOTE — Progress Notes (Signed)
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 Benson Norway 12/17/17  IMPRESSION -An infiltrative sessile and ulcerated non obstructing large mass in the cecum. This was noted to be in the cecal cap and was partially circumferential and 2cm in length. It was not noted to be bleeding.  -There was a 2 sessile polyps that were very close that were 4 to 60m that were not biopsies.  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  -The cecal mass was a poorly differentiated invasive adenocarcinoma.  [facility] [place]  Accession number is [id number]      12/17/2017 Imaging    CT CAP W Contrast 12/17/17  IMPRESSION: 1. Focal area of abnormal enhancement involving the ascending colon may represent mass discovered on recent colonoscopy. 2. No specific findings identified to suggest metastatic disease. 3. 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 Nonspecific. 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 4. Nonspecific 3 mm right upper lobe lung nodule is noted. No follow-up needed if patient is low-risk. Non-contrast chest CT can be considered in 12 months if patient is high-risk. This recommendation follows the consensus statement: Guidelines for Management of Incidental Pulmonary Nodules Detected on CT Images: From the Fleischner Society 2017; Radiology 2017; 284:228-243. 5. 7 mm low-density structure in segment 8 of the liver is too small to characterize. 6.  Aortic Atherosclerosis (ICD10-I70.0). 7. Gallstones.      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 BBarry Dieneson 02/02/18        02/02/2018 Pathology Results    Diagnosis 02/02/18  Colon, segmental resection for tumor, right - INVASIVE COLORECTAL ADENOCARCINOMA, TWO TUMORS, 5.5 AND 1.8 CM. - LARGER, 5.5 CM TUMOR INVOLVES VISCERAL PERITONEUM - SMALLER, 1.8 CM TUMOR INVOLVES SUBMUCOSA. - ONE EXTRAMURAL SATELLITE TUMOR NODULE. - METASTATIC CARCINOMA IN ONE OF TWENTY-NINE LYMPH NODES (1/29). - TWO TUBULAR ADENOMAS, 0.6 AND 0.8 CM. - BENIGN APPENDIX WITH FIBROSIS OF THE LUMEN.       Chemotherapy    Adjuvnat chemo FOLFOX every 2 weeks for 3-6 months       02/02/2018 Cancer Staging    Staging form: Colon and Rectum, AJCC 8th Edition - Pathologic stage from 02/02/2018: Stage IIIB (pT4a, pN1a, cM0) - Signed by [name] MD on 02/20/2018       HISTORY OF PRESENTING ILLNESS:   William Albee72y.o. male is a here because of recently diagnosed cecal cancer. The patient was referred  by Dr. Barry Dienes. The patient presents to the clinic today accompanied by himself.   He notes he saw Dr. Nyoka Cowden recently and found blood in his stool and he was referred to Dr. Benson Norway. He went for his first routine colonoscopy with Dr. Benson Norway and CT scan on 12/17/17. Pathology showed Adenocarcinoma of the cecum. He underwent surgery with Dr.  Barry Dienes on 02/02/18 for removal.  Before colonoscopy he denies having concerning symptoms that he noticed and had normal BMs.   Today he notes he is recovering well from surgery well. He notes he is active around the house and he will walk around. He is overall independent and can do things for himself. He did not regularly exercise before. His appetite is overall back to baseline with normal BMs.   Socially is not married and does not have children. He is retired from [employer] and lives with his sister William Oneill, almost 27yo. She left today to visit her son and would like to postpone further visits until she returns next week. He is a non-smoker and does not drink.   He has DM and takes Metformin, glipizide and Junuvia. He denies any heart issues or HTN. He denies family history of cancer although heart issues run in his family.     MEDICAL HISTORY:  Past Medical History:  Diagnosis Date  . Cancer ([place])   . Colon cancer ([place])   . Diabetes mellitus without complication ([place])    type 2  . High cholesterol   . History of kidney stones   . Insomnia   . Pneumonia    x1    SURGICAL HISTORY: Past Surgical History:  Procedure Laterality Date  . COLON SURGERY     laparoscopic ascending colectomy  Dr. Barry Dienes 02-02-18  . COLONOSCOPY    . LAPAROSCOPIC PARTIAL COLECTOMY N/A 02/02/2018   Procedure: LAPAROSCOPIC ASCENDING COLECTOMY;  Surgeon: Stark Klein, MD;  Location: [facility];  Service: General;  Laterality: N/A;  . NO PAST SURGERIES    . TONSILLECTOMY     as a child    SOCIAL HISTORY: Social History   Socioeconomic History  . Marital status: Single    Spouse name: Not on file  . Number of children: Not on file  . Years of education: Not on file  . Highest education level: Not on file  Occupational History  . Not on file  Social Needs  . Financial resource strain: Not on file  . Food insecurity:    Worry: Not on file    Inability: Not on file  . Transportation needs:     Medical: Not on file    Non-medical: Not on file  Tobacco Use  . Smoking status: Never Smoker  . Smokeless tobacco: Never Used  Substance and Sexual Activity  . Alcohol use: No    Alcohol/week: 0.0 oz  . Drug use: No  . Sexual activity: Yes  Lifestyle  . Physical activity:    Days per week: Not on file    Minutes per session: Not on file  . Stress: Not on file  Relationships  . Social connections:    Talks on phone: Not on file    Gets together: Not on file    Attends religious service: Not on file    Active member of club or organization: Not on file    Attends meetings of clubs or organizations: Not on file    Relationship status: Not on file  . Intimate partner violence:    Fear  of current or ex partner: Not on file    Emotionally abused: Not on file    Physically abused: Not on file    Forced sexual activity: Not on file  Other Topics Concern  . Not on file  Social History Narrative  . Not on file    FAMILY HISTORY: Family History  Problem Relation Age of Onset  . Heart disease Father   . Heart attack Father   . Heart disease Sister   . Diabetes Sister   . Stroke Mother     ALLERGIES:  has No Known Allergies.  MEDICATIONS:  Current Outpatient Medications  Medication Sig Dispense Refill  . acetaminophen (TYLENOL) 325 MG tablet Take 2 tablets (650 mg total) by mouth every 6 (six) hours as needed.    Marland Kitchen aspirin EC 81 MG tablet Take 81 mg by mouth daily.    Marland Kitchen atorvastatin (LIPITOR) 10 MG tablet Take 1 tablet (10 mg total) by mouth daily. 90 tablet 1  . blood glucose meter kit and supplies Dispense based on patient and insurance preference. Use up to four times daily as directed. (FOR ICD-9 250.00, 250.01). 1 each 0  . glipiZIDE (GLUCOTROL) 5 MG tablet Take 1 tablet (5 mg total) by mouth 2 (two) times daily before a meal. 180 tablet 1  . glucose blood (ONE TOUCH ULTRA TEST) test strip USE AS DIRECTED TO TEST UP TO 4 TIMES A DAY 100 each 6  . ibuprofen  (ADVIL,MOTRIN) 600 MG tablet Take 1 tablet (600 mg total) by mouth every 6 (six) hours as needed (pain not controlled with tylenol). 30 tablet 0  . metFORMIN (GLUCOPHAGE) 1000 MG tablet Take 1 tablet (1,000 mg total) by mouth 2 (two) times daily with a meal. 180 tablet 1  . Multiple Vitamin (MULTIVITAMIN WITH MINERALS) TABS tablet Take 1 tablet by mouth daily.    Marland Kitchen oxyCODONE (OXY IR/ROXICODONE) 5 MG immediate release tablet Take 1 tablet (5 mg total) by mouth every 4 (four) hours as needed for moderate pain or severe pain. 10 tablet 0  . sitaGLIPtin (JANUVIA) 100 MG tablet Take 1 tablet (100 mg total) by mouth daily. 90 tablet 1  . ferrous sulfate 325 (65 FE) MG EC tablet Take 1 tablet (325 mg total) by mouth 3 (three) times daily with meals. 30 tablet 2   No current facility-administered medications for this visit.     REVIEW OF SYSTEMS:   Constitutional: Denies fevers, chills or abnormal night sweats (+) appetite at baseline Eyes: Denies blurriness of vision, double vision or watery eyes Ears, nose, mouth, throat, and face: Denies mucositis or sore throat Respiratory: Denies cough, dyspnea or wheezes Cardiovascular: Denies palpitation, chest discomfort or lower extremity swelling Gastrointestinal:  Denies nausea, heartburn or change in bowel habits Skin: Denies abnormal skin rashes Lymphatics: Denies new lymphadenopathy or easy bruising Neurological:Denies numbness, tingling or new weaknesses Behavioral/Psych: Mood is stable, no new changes  All other systems were reviewed with the patient and are negative.  PHYSICAL EXAMINATION: ECOG PERFORMANCE STATUS: 1 - Symptomatic but completely ambulatory  Vitals:   02/19/18 1427  BP: 130/62  Pulse: 65  Resp: 18  Temp: 98.9 F (37.2 C)  SpO2: 100%   Filed Weights   02/19/18 1427  Weight: 178 lb 1.6 oz (80.8 kg)    GENERAL:alert, no distress and comfortable SKIN: skin color, texture, turgor are normal, no rashes or significant  lesions EYES: normal, conjunctiva are pink and non-injected, sclera clear OROPHARYNX:no exudate, no erythema and lips,  buccal mucosa, and tongue normal  NECK: supple, thyroid normal size, non-tender, without nodularity LYMPH:  no palpable lymphadenopathy in the cervical, axillary or inguinal LUNGS: clear to auscultation and percussion with normal breathing effort HEART: regular rate & rhythm and no murmurs and no lower extremity edema ABDOMEN:abdomen soft, non-tender and normal bowel sounds (+) surgical incision healing well  Musculoskeletal:no cyanosis of digits and no clubbing  PSYCH: alert & oriented x 3 with fluent speech NEURO: no focal motor/sensory deficits  LABORATORY DATA:  I have reviewed the data as listed CBC Latest Ref Rng & Units 02/05/2018 02/04/2018 02/03/2018  WBC 4.0 - 10.5 K/uL 8.5 11.9(H) 18.0(H)  Hemoglobin 13.0 - 17.0 g/dL 9.0(L) 8.5(L) 9.0(L)  Hematocrit 39.0 - 52.0 % 28.8(L) 27.4(L) 29.6(L)  Platelets 150 - 400 K/uL 443(H) 374 366    CMP Latest Ref Rng & Units 02/05/2018 02/04/2018 02/03/2018  Glucose 70 - 99 mg/dL 178(H) 270(H) 286(H)  BUN 8 - 23 mg/dL _0 Creatinine 0.61 - 1.24 mg/dL 0.67 0.98 0.94  Sodium 135 - 145 mmol/L 142 139 139  Potassium 3.5 - 5.1 mmol/L 4.0 5.0 4.2  Chloride 98 - 111 mmol/L 109 108 109  CO2 22 - 32 mmol/L 26 26 19(L)  Calcium 8.9 - 10.3 mg/dL 8.2(L) 8.0(L) 8.2(L)  Total Protein 6.5 - 8.1 g/dL - - -  Total Bilirubin 0.3 - 1.2 mg/dL - - -  Alkaline Phos 38 - 126 U/L - - -  AST 15 - 41 U/L - - -  ALT 0 - 44 U/L - - -     PATHOLOGY   Diagnosis 02/02/18  Colon, segmental resection for tumor, right - INVASIVE COLORECTAL ADENOCARCINOMA, TWO TUMORS, 5.5 AND 1.8 CM. - LARGER, 5.5 CM TUMOR INVOLVES VISCERAL PERITONEUM - SMALLER, 1.8 CM TUMOR INVOLVES SUBMUCOSA. - ONE EXTRAMURAL SATELLITE TUMOR NODULE. - METASTATIC CARCINOMA IN ONE OF TWENTY-NINE LYMPH NODES (1/29). - TWO TUBULAR ADENOMAS, 0.6 AND 0.8 CM. - BENIGN APPENDIX WITH  FIBROSIS OF THE LUMEN. Microscopic Comment COLON AND RECTUM: Resection, Including Transanal Disk Excision of Rectal Neoplasms Procedure: Right segmental colon resection Tumor Site: Cecum Tumor Size: 5.5 and 1.8 cm Macroscopic Tumor Perforation: No Histologic Type: Colorectal adenocarcinoma Histologic Grade: Moderately differentiated. Tumor Extension: Visceral peritoneum (5.5 cm tumor) and submucosa (1.8 cm tumor). Margins: Free of tumor Treatment Effect: No Lymphovascular Invasion: Present Perineural Invasion: No Tumor Deposits: [facility] Lymph Nodes: No lymph nodes submitted or found: 30 Number of Lymph Nodes Involved: 1 Number of Lymph Nodes Examined: 29 Pathologic Stage Classification (pTNM, AJCC 8th Edition): pT4a (multifocal), pN1a Ancillary Studies: Yes (MMR / MSI testing: pending ) Representative tumor block: 1J Comments: There are two invasive colorectal adenocarcinomas, the largest is 5.5 cm and extends through the 1 of 5 Supplemental copy SUPPLEMENTAL for MICHARL, HELMES [phone]) Microscopic Comment(continued) muscularis propria and focally involves the visceral peritoneum. There is a smaller 1.8 cm invasive colorectal carcinoma. which extends into the submucosa. (JDP:kh 02/05/18)         PROCEDURES  Colonoscopy by Dr. Benson Norway 12/17/17  IMPRESSION -An infiltrative sessile and ulcerated non obstructing large mass in the cecum. This was noted to be in the cecal cap and was partially circumferential and 2cm in length. It was not noted to be bleeding.  -There was a 2 sessile polyps that were very close that were 4 to 82m that were not biopsies.  -An 8 mm polyp was found in the descending colon and pathology confirmed that it was  a tubulovillous adenoma.  -The cecal mass was a poorly differentiated invasive adenocarcinoma.  The pathology was performed at [facility] in [place], [place].  Accession number is [id number].     RADIOGRAPHIC STUDIES: I have  personally reviewed the radiological images as listed and agreed with the findings in the report. No results found.    CT CAP W Contrast 12/17/17  IMPRESSION: 1. Focal area of abnormal enhancement involving the ascending colon may represent mass discovered on recent colonoscopy. 2. No specific findings identified to suggest metastatic disease. 3. 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 Nonspecific. 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 4. Nonspecific 3 mm right upper lobe lung nodule is noted. No follow-up needed if patient is low-risk. Non-contrast chest CT can be considered in 12 months if patient is high-risk. This recommendation follows the consensus statement: Guidelines for Management of Incidental Pulmonary Nodules Detected on CT Images: From the Fleischner Society 2017; Radiology 2017; 284:228-243. 5. 7 mm low-density structure in segment 8 of the liver is too small to characterize. 6.  Aortic Atherosclerosis (ICD10-I70.0). 7. Gallstones.  ASSESSMENT & PLAN:  William Oneill is a 72 y.o. Caucasian male with a history of DM.   1. Cecal Cancer, adenocarcinoma, grade 2, pT4aN1aM0, stage IIIB, MSS -I reviewed and discussed his scans and pathology results. He has stage III Invasive right colonl adenocarcinoma and underwent complete resection with 1/29 positive lymph nodes on 02/02/18.  -His restaging CT scan was negative for distant metastasis. -We discussed the risk of cancer recurrence.  Due to the T4 a lesion, and positive lymph nodes, he is at high risk for recurrence. -I recommend standard adjuvant chemotherapy to reduce his risk of recurrence.  We discussed the option of a proximal versus FOLFOX.  Given his advanced age, I recommend FOLFOX every 2 weeks for 6 months due to the T4a lesion and positive lymph node. I discussed the first 3 months  are most beneficial and if he can tolerate he can continue for the full 6 months, but okay to shorten his chemo duration if he has significant side effects. He is agreeable to proceed.  -Chemotherapy consent: Side effects including but does not not limited to, fatigue, nausea, vomiting, diarrhea, hair loss, neuropathy, fluid retention, renal and kidney dysfunction, neutropenic fever, needed for blood transfusion, bleeding, coronary artery spasm, heart failure were discussed with patient in great detail. He agrees to proceed. -Goal of therapy is curative. -Given his overall good health I think he would tolerate chemotherapy at his age. Will proceed to give him more time to recover. I encouraged him to eat adequately and remain active.  -He will proceed with chemo education class in the next 2 weeks. I discussed the option of PAC placement by Dr. Barry Dienes and he is interested.  -plan to start chemo the week of 8/5, chemo class the week before   2. Anemia  -His has had mild to moderate anemia lately plus of iron deficiency earlier this year -He received blood transfusion on 02/07/18  -I prescribed him oral iron ferrous sulfate today to take once daily for at least 3 months.  -Will monitor CBC and iron level    3. DM -On Metformin, glipizide and Junuvia -Continue to follow up with PCP for management  -We monitor his glucose level closely during the chemo, due to the premedication dexamethasone, his glucose level is likely going to  be high and medication adjustment may needed, I discussed with him.  4. 58m liver lesion  -His staging CT scan with contrast showed a 7 mm low-density structure in segment 8 of the liver, unlikely metastasis, but need to be followed. -Plans to repeat CT scan when he completes adjuvant chemo    PLAN:  -I prescribed him oral iron ferrous sulfate today  -Chemo class the week of 8/1 -Lab, flush, f/u with me or Lacie and chemo FOLFOX every 2 weeks X4, starting the week of  8/5  -port placement by Dr. BBarry Dienes I will send a message to her  -[facility]     All questions were answered. The patient knows to call the clinic with any problems, questions or concerns. I spent 55 minutes counseling the patient face to face. The total time spent in the appointment was 60 minutes and more than 50% was on counseling.     YTruitt Merle MD 02/19/2018 3:52 PM  I, AJoslyn Devon am acting as scribe for YTruitt Merle MD.   I have reviewed the above documentation for accuracy and completeness, and I agree with the above.

## 2018-02-19 NOTE — Progress Notes (Signed)
Met with William Oneill during initial consult with Dr. Burr Medico. Explained role of nurse navigator. Educational information provided on colorectal cancer and Port-A-Cath care. Written information from Cancer.org provided to patient along with information sheets on Oxaliplatin and 5FU.  Patient is living with a sister at present who was on vacation and could not come to today's appointment. Patient has my contact information and encouraged to call with questions or concerns. I will reach out to patient next week. He does not want to start tx until his sister returns from her vacation on 02/26/18. I offered information on cancer support groups at [facility]. Patient stated that he would think about it.

## 2018-02-20 ENCOUNTER — Encounter: Payer: Self-pay | Admitting: Hematology

## 2018-02-20 MED ORDER — ONDANSETRON HCL 8 MG PO TABS: 8.0000 mg | ORAL_TABLET | Freq: Two times a day (BID) | ORAL | 1 refills | Status: DC | PRN

## 2018-02-20 MED ORDER — PROCHLORPERAZINE MALEATE 10 MG PO TABS: 10.0000 mg | ORAL_TABLET | Freq: Four times a day (QID) | ORAL | 1 refills | Status: DC | PRN

## 2018-02-20 MED ORDER — LIDOCAINE-PRILOCAINE 2.5-2.5 % EX CREA
TOPICAL_CREAM | CUTANEOUS | 3 refills | Status: DC
Start: 2018-02-20 — End: 2018-10-06

## 2018-02-20 NOTE — Progress Notes (Signed)
START ON PATHWAY REGIMEN - Colorectal     A cycle is every 14 days:     Oxaliplatin      Leucovorin      5-Fluorouracil      5-Fluorouracil   **Always confirm dose/schedule in your pharmacy ordering system**  Patient Characteristics: Colon Adjuvant, Stage III, High Risk (T4 or N2) Current evidence of distant metastases<= No AJCC T Category: T4a AJCC N Category: N1a AJCC M Category: M0 AJCC 8 Stage Grouping: IIIB Intent of Therapy: Curative Intent, Discussed with Patient

## 2018-02-23 ENCOUNTER — Other Ambulatory Visit: Payer: Self-pay

## 2018-02-23 ENCOUNTER — Encounter (HOSPITAL_BASED_OUTPATIENT_CLINIC_OR_DEPARTMENT_OTHER): Payer: Self-pay | Admitting: *Deleted

## 2018-02-23 NOTE — Progress Notes (Signed)
Called to speak with patient but he was not home. I was able to speak to his sister with whom he resides. I was able to answer questions she had about upcoming appointments and to clarify that he can bring two people with him to his chemo education class. I encouarged her to call with questions or concerns.

## 2018-02-23 NOTE — Progress Notes (Signed)
Called and left voice message informing pt that he needed to come in before the DOS to pick up his pre-surgical ensure.

## 2018-02-24 NOTE — Progress Notes (Signed)
Ensure pre surgery drink given with instructions to complete by 0600 dos, pt verbalized understanding. 

## 2018-03-01 ENCOUNTER — Ambulatory Visit (INDEPENDENT_AMBULATORY_CARE_PROVIDER_SITE_OTHER): Payer: Medicare Other | Admitting: Family Medicine

## 2018-03-01 ENCOUNTER — Encounter: Payer: Self-pay | Admitting: Family Medicine

## 2018-03-01 ENCOUNTER — Inpatient Hospital Stay: Payer: Medicare Other

## 2018-03-01 VITALS — BP 108/56 | HR 68 | Temp 98.8°F | Ht 65.0 in | Wt 178.8 lb

## 2018-03-01 DIAGNOSIS — C180 Malignant neoplasm of cecum: Secondary | ICD-10-CM | POA: Diagnosis not present

## 2018-03-01 DIAGNOSIS — G4700 Insomnia, unspecified: Secondary | ICD-10-CM

## 2018-03-01 DIAGNOSIS — E1165 Type 2 diabetes mellitus with hyperglycemia: Secondary | ICD-10-CM | POA: Diagnosis not present

## 2018-03-01 DIAGNOSIS — IMO0001 Reserved for inherently not codable concepts without codable children: Secondary | ICD-10-CM

## 2018-03-01 DIAGNOSIS — E119 Type 2 diabetes mellitus without complications: Secondary | ICD-10-CM | POA: Diagnosis not present

## 2018-03-01 MED ORDER — GLIPIZIDE 5 MG PO TABS: ORAL_TABLET | ORAL | 1 refills | Status: DC

## 2018-03-01 MED ORDER — METFORMIN HCL 1000 MG PO TABS: 1000.0000 mg | ORAL_TABLET | Freq: Two times a day (BID) | ORAL | 1 refills | Status: DC

## 2018-03-01 MED ORDER — HYDROXYZINE HCL 25 MG PO TABS: 12.5000 mg | ORAL_TABLET | Freq: Every evening | ORAL | 0 refills | Status: DC | PRN

## 2018-03-01 MED ORDER — SITAGLIPTIN PHOSPHATE 100 MG PO TABS: 100.0000 mg | ORAL_TABLET | Freq: Every day | ORAL | 1 refills | Status: DC

## 2018-03-01 NOTE — Progress Notes (Signed)
Subjective:    Patient ID: William Oneill, male    DOB: 08/17/1945, 72 y.o.   MRN: [id number]  HPI William Oneill is a 72 y.o. male Presents today for: Chief Complaint  Patient presents with  . Diabetes    3 m f/u    Cecal Cancer: grade 2, pT4aN1aM0, stage IIIB, MSS Recently diagnosed with cecal cancer status post laproscopic right proximal colectomy on July 2nd.  Surgeon is Dr. Barry Dienes, oncologist Dr. Burr Medico.  Appointment with oncology July 19.  Colonoscopy by Dr. Benson Norway on May 16th.  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   Pathology results from July 2 surgery 2 tumors 5.5 and 1.8 cm.  There was one extramural satellite tumor nodule with metastatic carcinoma in 1 out of 29 lymph nodes.  Benign appendix.  Restaging CT scan without distant metastases.  Plan for adjuvant chemotherapy to reduce risk of recurrence.  Plan for FOLFOX every 2 weeks for 6 months but could potentially shorten duration if significant side effects.  Plan for start of chemotherapy on the week of August 5. Iron was prescribed for iron deficiency anemia.  Plan for monitoring CBC and iron level with oncology. Had staples removed a few weeks ago. Wounds have been healing.  Plans on portacath this Wednesday.  Walking for exercise.  Denies depression, but still some sleep issues. No current meds. Hydroxyzine was helping when discussed in April. Out of that med. No dizziness when taken.  Depression screen [facility] 2/9 03/01/2018 03/01/2018 11/27/2017 11/12/2017 11/06/2017  Decreased Interest 0 0 0 0 0  Down, Depressed, Hopeless 0 0 0 0 0  PHQ - 2 Score 0 0 0 0 0  Altered sleeping - - - - -  Tired, decreased energy - - - - -  Change in appetite - - - - -  Feeling bad or failure about yourself  - - - - -  Trouble concentrating - - - - -  Moving slowly or fidgety/restless - - - - -  Suicidal thoughts - - - - -  PHQ-9 Score - - - - -  Difficult doing work/chores - - - - -     Diabetes: Lab  Results  Component Value Date   HGBA1C 8.6 (H) 01/26/2018   Wt Readings from Last 3 Encounters:  03/01/18 178 lb 12.8 oz (81.1 kg)  02/19/18 178 lb 1.6 oz (80.8 kg)  02/05/18 186 lb (84.4 kg)    Urine microalbumin normal at 3.7 on 04/30/2017:   Foot exam 04/30/2017 Optho 08/07/2017 Pneumovax 10/03/2015  Currently takes metformin '1000mg'$  bid, glipizide '5mg'$  bid, Januvia '100mg'$  qd. Recent notes reviewed with oncology, anticipate some increased readings with premedication dexamethasone for chemotherapy. No missed doses of meds. Home readings 138-170, up to 180's. Lowest 138.  No new diarrhea/bloating or GI upset with metformin.      Patient Active Problem List   Diagnosis Date Noted  . 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 Generalized weakness 04/11/2015  . Acute purulent bronchitis 04/11/2015  . Dehydration 04/11/2015  . Nocturia more than twice per night 01/16/2015  . Snoring 01/16/2015  . 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 Obese abdomen 01/16/2015  . Other fatigue 01/16/2015  . Type 2 diabetes mellitus without complication ([place]) 46/27/0350  . Insomnia    Past Medical History:  Diagnosis Date  . Anemia   . Cancer ([place])   . Colon cancer ([place])   . Diabetes mellitus without  complication ([place])    type 2  . High cholesterol   . History of kidney stones   . Insomnia   . Pneumonia    x1   Past Surgical History:  Procedure Laterality Date  . COLON SURGERY     laparoscopic ascending colectomy  Dr. Barry Dienes 02-02-18  . COLONOSCOPY    . LAPAROSCOPIC PARTIAL COLECTOMY N/A 02/02/2018   Procedure: LAPAROSCOPIC ASCENDING COLECTOMY;  Surgeon: Stark Klein, MD;  Location: [facility];  Service: General;  Laterality: N/A;  . NO PAST SURGERIES    . TONSILLECTOMY     as a child   No Known Allergies Prior to Admission medications   Medication Sig Start Date End Date Taking? Authorizing Provider  acetaminophen (TYLENOL) 325 MG tablet Take 2  tablets (650 mg total) by mouth every 6 (six) hours as needed. 02/06/18  Yes [name], MD  aspirin EC 81 MG tablet Take 81 mg by mouth daily.   Yes [provider]  atorvastatin (LIPITOR) 10 MG tablet Take 1 tablet (10 mg total) by mouth daily. 11/12/17  Yes [name], MD  blood glucose meter kit and supplies Dispense based on patient and insurance preference. Use up to four times daily as directed. (FOR ICD-9 250.00, 250.01). 12/11/14  Yes [name], MD  ferrous sulfate 325 (65 FE) MG EC tablet Take 1 tablet (325 mg total) by mouth 3 (three) times daily with meals. 02/19/18  Yes [name], MD  glipiZIDE (GLUCOTROL) 5 MG tablet Take 1 tablet (5 mg total) by mouth 2 (two) times daily before a meal. 11/12/17  Yes [name], MD  glucose blood (ONE TOUCH ULTRA TEST) test strip USE AS DIRECTED TO TEST UP TO 4 TIMES A DAY 12/07/17  Yes [name], MD  ibuprofen (ADVIL,MOTRIN) 600 MG tablet Take 1 tablet (600 mg total) by mouth every 6 (six) hours as needed (pain not controlled with tylenol). 02/06/18  Yes [name], MD  lidocaine-prilocaine (EMLA) cream Apply to affected area once 02/20/18  Yes [name], MD  metFORMIN (GLUCOPHAGE) 1000 MG tablet Take 1 tablet (1,000 mg total) by mouth 2 (two) times daily with a meal. 11/12/17  Yes [name], MD  metroNIDAZOLE (FLAGYL) 500 MG tablet  01/25/18  Yes [provider]  Multiple Vitamin (MULTIVITAMIN WITH MINERALS) TABS tablet Take 1 tablet by mouth daily.   Yes [provider]  neomycin (MYCIFRADIN) 500 MG tablet  01/25/18  Yes [provider]  ondansetron (ZOFRAN) 8 MG tablet Take 1 tablet (8 mg total) by mouth 2 (two) times daily as needed for refractory nausea / vomiting. Start on day 3 after chemotherapy. 02/20/18  Yes [name], MD  oxyCODONE (OXY IR/ROXICODONE) 5 MG immediate release tablet Take 1 tablet (5 mg total) by mouth every 4 (four) hours as needed for moderate pain or  severe pain. 02/06/18  Yes [name], MD  prochlorperazine (COMPAZINE) 10 MG tablet Take 1 tablet (10 mg total) by mouth every 6 (six) hours as needed (Nausea or vomiting). 02/20/18  Yes [name], MD  sitaGLIPtin (JANUVIA) 100 MG tablet Take 1 tablet (100 mg total) by mouth daily. 11/12/17  Yes [name], MD   Social History   Socioeconomic History  . Marital status: Single    Spouse name: Not on file  . Number of children: Not on file  . Years of education: Not on file  . Highest education level: Not on file  Occupational History  . Not on file  Social Needs  . Financial resource strain: Not on file  . Food insecurity:    Worry: Not on file    Inability: Not on file  . Transportation needs:    Medical: Not on file    Non-medical: Not on file  Tobacco Use  . Smoking status: Never Smoker  . Smokeless tobacco: Never Used  Substance and Sexual Activity  . Alcohol use: No    Alcohol/week: 0.0 oz  . Drug use: No  . Sexual activity: Yes  Lifestyle  . Physical activity:    Days per week: Not on file    Minutes per session: Not on file  . Stress: Not on file  Relationships  . Social connections:    Talks on phone: Not on file    Gets together: Not on file    Attends religious service: Not on file    Active member of club or organization: Not on file    Attends meetings of clubs or organizations: Not on file    Relationship status: Not on file  . Intimate partner violence:    Fear of current or ex partner: Not on file    Emotionally abused: Not on file    Physically abused: Not on file    Forced sexual activity: Not on file  Other Topics Concern  . Not on file  Social History Narrative  . Not on file    Review of Systems  Constitutional: Negative for fatigue and unexpected weight change.  Eyes: Negative for visual disturbance.  Respiratory: Negative for cough, chest tightness and shortness of breath.   Cardiovascular: Negative for chest pain, palpitations  and leg swelling.  Gastrointestinal: Negative for abdominal pain and blood in stool.  Neurological: Negative for dizziness, light-headedness and headaches.       Objective:   Physical Exam  Constitutional: He is oriented to person, place, and time. He appears well-developed and well-nourished.  HENT:  Head: Normocephalic and atraumatic.  Eyes: Pupils are equal, round, and reactive to light. EOM are normal.  Neck: No JVD present. Carotid bruit is not present.  Cardiovascular: Normal rate, regular rhythm and normal heart sounds.  No murmur heard. Pulmonary/Chest: Effort normal and breath sounds normal. He has no rales.  Abdominal:    Staple removed in office. No complications.   Musculoskeletal: He exhibits no edema.  Neurological: He is alert and oriented to person, place, and time.  Skin: Skin is warm and dry.  Psychiatric: He has a normal mood and affect.  Vitals reviewed.  Vitals:   03/01/18 1628  BP: (!) 128/58  Pulse: 68  Temp: 98.8 F (37.1 C)  TempSrc: Oral  SpO2: 100%  Weight: 178 lb 12.8 oz (81.1 kg)  Height: '5\' 5"'$  (1.651 m)       Assessment & Plan:    SLATON REASER is a 72 y.o. male Cecal cancer ([place])  -Prior notes reviewed, status post surgery as above.  One remaining staple removed without difficulty and prior surgical wounds appear to be healing well.  -Planning for chemotherapy as above.  -Denies depression, but is appropriately concerned about nature of illness and treatment.  Option of counseling discussed but deferred at this time.  Plan to check on him again in 6 weeks.  Uncontrolled type 2 diabetes mellitus without complication, without long-term current use of insulin ([facility]) - Plan: sitaGLIPtin (JANUVIA) 100 MG tablet, metFORMIN (GLUCOPHAGE) 1000 MG tablet Type 2 diabetes mellitus without complication,  without long-term current use of insulin ([place]) - Plan: glipiZIDE (GLUCOTROL) 5 MG tablet  -Recently decreased control, and anticipate worsening  glycemic control with upcoming chemotherapy and pretreatment steroids.  -Increase glipizide to 10 mg with largest meal, 5 mg other meal, continue metformin and Januvia same doses for now and recheck in 6 weeks with home readings.  RTC precautions if acute worsening of readings sooner, and hypoglycemic precautions given.   Insomnia, unspecified type - Plan: hydrOXYzine (ATARAX/VISTARIL) 25 MG tablet  -Previously controlled with hydroxyzine, refilled.  Potential side effects discussed, RTC precautions  Meds ordered this encounter  Medications  . sitaGLIPtin (JANUVIA) 100 MG tablet    Sig: Take 1 tablet (100 mg total) by mouth daily.    Dispense:  90 tablet    Refill:  1  . metFORMIN (GLUCOPHAGE) 1000 MG tablet    Sig: Take 1 tablet (1,000 mg total) by mouth 2 (two) times daily with a meal.    Dispense:  180 tablet    Refill:  1  . glipiZIDE (GLUCOTROL) 5 MG tablet    Sig: '10mg'$  by mouth with breakfast, '5mg'$  by mouth with dinner.    Dispense:  270 tablet    Refill:  1  . hydrOXYzine (ATARAX/VISTARIL) 25 MG tablet    Sig: Take 0.5-1 tablets (12.5-25 mg total) by mouth at bedtime as needed (sleep).    Dispense:  30 tablet    Refill:  0   Patient Instructions    Increase glipizide to 2 pills with breakfast, 1 with dinner. Watch for low blood sugars. See precautions below. If you do drop low, let me know so we can adjust medications. Follow up in 6 weeks with home readings to decide on other adjustments.   1/2 pill of hydroxyzine for sleep.  Full pill if needed, but use lowest effective dose.   I removed the additional staple. Watch for any redness in that area, but expect it to be fine.    Hypoglycemia Hypoglycemia occurs when the level of sugar (glucose) in the blood is too low. Glucose is a type of sugar that provides the body's main source of energy. Certain hormones (insulin and glucagon) control the level of glucose in the blood. Insulin lowers blood glucose, and glucagon increases  blood glucose. Hypoglycemia can result from having too much insulin in the bloodstream, or from not eating enough food that contains glucose. Hypoglycemia can happen in people who do or do not have diabetes. It can develop quickly, and it can be a medical emergency. What are the causes? Hypoglycemia occurs most often in people who have diabetes. If you have diabetes, hypoglycemia may be caused by:  Diabetes medicine.  Not eating enough, or not eating often enough.  Increased physical activity.  Drinking alcohol, especially when you have not eaten recently.  If you do not have diabetes, hypoglycemia may be caused by:  A tumor in the pancreas. The pancreas is the organ that makes insulin.  Not eating enough, or not eating for long periods at a time (fasting).  Severe infection or illness that affects the liver, heart, or kidneys.  Certain medicines.  You may also have reactive hypoglycemia. This condition causes hypoglycemia within 4 hours of eating a meal. This may occur after having stomach surgery. Sometimes, the cause of reactive hypoglycemia is not known. What increases the risk? Hypoglycemia is more likely to develop in:  People who have diabetes and take medicines to lower blood glucose.  People who abuse alcohol.  People who have a severe illness.  What are the signs or symptoms? Hypoglycemia may not cause any symptoms. If you have symptoms, they may include:  Hunger.  Anxiety.  Sweating and feeling clammy.  Confusion.  Dizziness or feeling light-headed.  Sleepiness.  Nausea.  Increased heart rate.  Headache.  Blurry vision.  Seizure.  Nightmares.  Tingling or numbness around the mouth, lips, or tongue.  A change in speech.  Decreased ability to concentrate.  A change in coordination.  Restless sleep.  Tremors or shakes.  Fainting.  Irritability.  How is this diagnosed? Hypoglycemia is diagnosed with a blood test to measure your blood  glucose level. This blood test is done while you are having symptoms. Your health care provider may also do a physical exam and review your medical history. If you do not have diabetes, other tests may be done to find the cause of your hypoglycemia. How is this treated? This condition can often be treated by immediately eating or drinking something that contains glucose, such as:  3-4 sugar tablets (glucose pills).  Glucose gel, 15-gram tube.  Fruit juice, 4 oz (120 mL).  Regular soda (not diet soda), 4 oz (120 mL).  Low-fat milk, 4 oz (120 mL).  Several pieces of hard candy.  Sugar or honey, 1 Tbsp.  Treating Hypoglycemia If You Have Diabetes  If you are alert and able to swallow safely, follow the 15:15 rule:  Take 15 grams of a rapid-acting carbohydrate. Rapid-acting options include: ? 1 tube of glucose gel. ? 3 glucose pills. ? 6-8 pieces of hard candy. ? 4 oz (120 mL) of fruit juice. ? 4 oz (120 ml) of regular (not diet) soda.  Check your blood glucose 15 minutes after you take the carbohydrate.  If the repeat blood glucose level is still at or below 70 mg/dL (3.9 mmol/L), take 15 grams of a carbohydrate again.  If your blood glucose level does not increase above 70 mg/dL (3.9 mmol/L) after 3 tries, seek emergency medical care.  After your blood glucose level returns to normal, eat a meal or a snack within 1 hour.  Treating Severe Hypoglycemia Severe hypoglycemia is when your blood glucose level is at or below 54 mg/dL (3 mmol/L). Severe hypoglycemia is an emergency. Do not wait to see if the symptoms will go away. Get medical help right away. Call your local emergency services (911 in the [place] Do not drive yourself to the hospital. If you have severe hypoglycemia and you cannot eat or drink, you may need an injection of glucagon. A family member or close friend should learn how to check your blood glucose and how to give you a glucagon injection. Ask your health care  provider if you need to have an emergency glucagon injection kit available. Severe hypoglycemia may need to be treated in a hospital. The treatment may include getting glucose through an IV tube. You may also need treatment for the cause of your hypoglycemia. Follow these instructions at home: General instructions  Avoid any diets that cause you to not eat enough food. Talk with your health care provider before you start any new diet.  Take over-the-counter and prescription medicines only as told by your health care provider.  Limit alcohol intake to no more than 1 drink per day for nonpregnant women and 2 drinks per day for men. One drink equals 12 oz of beer, 5 oz of wine, or 1 oz of hard liquor.  Keep all follow-up visits as  told by your health care provider. This is important. If You Have Diabetes:   Make sure you know the symptoms of hypoglycemia.  Always have a rapid-acting carbohydrate snack with you to treat low blood sugar.  Follow your diabetes management plan, as told by your health care provider. Make sure you: ? Take your medicines as directed. ? Follow your exercise plan. ? Follow your meal plan. Eat on time, and do not skip meals. ? Check your blood glucose as often as directed. Make sure to check your blood glucose before and after exercise. If you exercise longer or in a different way than usual, check your blood glucose more often. ? Follow your sick day plan whenever you cannot eat or drink normally. Make this plan in advance with your health care provider.  Share your diabetes management plan with people in your workplace, school, and household.  Check your urine for ketones when you are ill and as told by your health care provider.  Carry a medical alert card or wear medical alert jewelry. If You Have Reactive Hypoglycemia or Low Blood Sugar From Other Causes:  Monitor your blood glucose as told by your health care provider.  Follow instructions from your  health care provider about eating or drinking restrictions. Contact a health care provider if:  You have problems keeping your blood glucose in your target range.  You have frequent episodes of hypoglycemia. Get help right away if:  You continue to have hypoglycemia symptoms after eating or drinking something containing glucose.  Your blood glucose is at or below 54 mg/dL (3 mmol/L).  You have a seizure.  You faint. These symptoms may represent a serious problem that is an emergency. Do not wait to see if the symptoms will go away. Get medical help right away. Call your local emergency services (911 in the [place] Do not drive yourself to the hospital. This information is not intended to replace advice given to you by your health care provider. Make sure you discuss any questions you have with your health care provider. Document Released: 07/21/2005 Document Revised: 01/02/2016 Document Reviewed: 08/24/2015 Elsevier Interactive Patient Education  2018 [employer]. Insomnia Insomnia is a sleep disorder that makes it difficult to fall asleep or to stay asleep. Insomnia can cause tiredness (fatigue), low energy, difficulty concentrating, mood swings, and poor performance at work or school. There are three different ways to classify insomnia:  Difficulty falling asleep.  Difficulty staying asleep.  Waking up too early in the morning.  Any type of insomnia can be long-term (chronic) or short-term (acute). Both are common. Short-term insomnia usually lasts for three months or less. Chronic insomnia occurs at least three times a week for longer than three months. What are the causes? Insomnia may be caused by another condition, situation, or substance, such as:  Anxiety.  Certain medicines.  Gastroesophageal reflux disease (GERD) or other gastrointestinal conditions.  Asthma or other breathing conditions.  Restless legs syndrome, sleep apnea, or other sleep disorders.  Chronic  pain.  Menopause. This may include hot flashes.  Stroke.  Abuse of alcohol, tobacco, or illegal drugs.  Depression.  Caffeine.  Neurological disorders, such as Alzheimer disease.  An overactive thyroid (hyperthyroidism).  The cause of insomnia may not be known. What increases the risk? Risk factors for insomnia include:  Gender. Women are more commonly affected than men.  Age. Insomnia is more common as you get older.  Stress. This may involve your professional or personal life.  Income.  Insomnia is more common in people with lower income.  Lack of exercise.  Irregular work schedule or night shifts.  Traveling between different time zones.  What are the signs or symptoms? If you have insomnia, trouble falling asleep or trouble staying asleep is the main symptom. This may lead to other symptoms, such as:  Feeling fatigued.  Feeling nervous about going to sleep.  Not feeling rested in the morning.  Having trouble concentrating.  Feeling irritable, anxious, or depressed.  How is this treated? Treatment for insomnia depends on the cause. If your insomnia is caused by an underlying condition, treatment will focus on addressing the condition. Treatment may also include:  Medicines to help you sleep.  Counseling or therapy.  Lifestyle adjustments.  Follow these instructions at home:  Take medicines only as directed by your health care provider.  Keep regular sleeping and waking hours. Avoid naps.  Keep a sleep diary to help you and your health care provider figure out what could be causing your insomnia. Include: ? When you sleep. ? When you wake up during the night. ? How well you sleep. ? How rested you feel the next day. ? Any side effects of medicines you are taking. ? What you eat and drink.  Make your bedroom a comfortable place where it is easy to fall asleep: ? Put up shades or special blackout curtains to block light from outside. ? Use a  white noise machine to block noise. ? Keep the temperature cool.  Exercise regularly as directed by your health care provider. Avoid exercising right before bedtime.  Use relaxation techniques to manage stress. Ask your health care provider to suggest some techniques that may work well for you. These may include: ? Breathing exercises. ? Routines to release muscle tension. ? Visualizing peaceful scenes.  Cut back on alcohol, caffeinated beverages, and cigarettes, especially close to bedtime. These can disrupt your sleep.  Do not overeat or eat spicy foods right before bedtime. This can lead to digestive discomfort that can make it hard for you to sleep.  Limit screen use before bedtime. This includes: ? Watching TV. ? Using your smartphone, tablet, and computer.  Stick to a routine. This can help you fall asleep faster. Try to do a quiet activity, brush your teeth, and go to bed at the same time each night.  Get out of bed if you are still awake after 15 minutes of trying to sleep. Keep the lights down, but try reading or doing a quiet activity. When you feel sleepy, go back to bed.  Make sure that you drive carefully. Avoid driving if you feel very sleepy.  Keep all follow-up appointments as directed by your health care provider. This is important. Contact a health care provider if:  You are tired throughout the day or have trouble in your daily routine due to sleepiness.  You continue to have sleep problems or your sleep problems get worse. Get help right away if:  You have serious thoughts about hurting yourself or someone else. This information is not intended to replace advice given to you by your health care provider. Make sure you discuss any questions you have with your health care provider. Document Released: 07/18/2000 Document Revised: 12/21/2015 Document Reviewed: 04/21/2014 Elsevier Interactive Patient Education  2018 [employer].    IF you received an x-ray  today, you will receive an invoice from [facility]. Please contact [facility] at [phone] with questions or concerns regarding your invoice.  IF you received labwork today, you will receive an invoice from [facility]. Please contact [facility] at [phone] with questions or concerns regarding your invoice.   Our billing staff will not be able to assist you with questions regarding bills from these companies.  You will be contacted with the lab results as soon as they are available. The fastest way to get your results is to activate your My Chart account. Instructions are located on the last page of this paperwork. If you have not heard from [place] regarding the results in 2 weeks, please contact this office.       Signed,   [name], MD [facility].  03/03/18 4:08 PM

## 2018-03-01 NOTE — Patient Instructions (Addendum)
Increase glipizide to 2 pills with breakfast, 1 with dinner. Watch for low blood sugars. See precautions below. If you do drop low, let me know so we can adjust medications. Follow up in 6 weeks with home readings to decide on other adjustments.   1/2 pill of hydroxyzine for sleep.  Full pill if needed, but use lowest effective dose.   I removed the additional staple. Watch for any redness in that area, but expect it to be fine.    Hypoglycemia Hypoglycemia occurs when the level of sugar (glucose) in the blood is too low. Glucose is a type of sugar that provides the body's main source of energy. Certain hormones (insulin and glucagon) control the level of glucose in the blood. Insulin lowers blood glucose, and glucagon increases blood glucose. Hypoglycemia can result from having too much insulin in the bloodstream, or from not eating enough food that contains glucose. Hypoglycemia can happen in people who do or do not have diabetes. It can develop quickly, and it can be a medical emergency. What are the causes? Hypoglycemia occurs most often in people who have diabetes. If you have diabetes, hypoglycemia may be caused by:  Diabetes medicine.  Not eating enough, or not eating often enough.  Increased physical activity.  Drinking alcohol, especially when you have not eaten recently.  If you do not have diabetes, hypoglycemia may be caused by:  A tumor in the pancreas. The pancreas is the organ that makes insulin.  Not eating enough, or not eating for long periods at a time (fasting).  Severe infection or illness that affects the liver, heart, or kidneys.  Certain medicines.  You may also have reactive hypoglycemia. This condition causes hypoglycemia within 4 hours of eating a meal. This may occur after having stomach surgery. Sometimes, the cause of reactive hypoglycemia is not known. What increases the risk? Hypoglycemia is more likely to develop in:  People who have diabetes and  take medicines to lower blood glucose.  People who abuse alcohol.  People who have a severe illness.  What are the signs or symptoms? Hypoglycemia may not cause any symptoms. If you have symptoms, they may include:  Hunger.  Anxiety.  Sweating and feeling clammy.  Confusion.  Dizziness or feeling light-headed.  Sleepiness.  Nausea.  Increased heart rate.  Headache.  Blurry vision.  Seizure.  Nightmares.  Tingling or numbness around the mouth, lips, or tongue.  A change in speech.  Decreased ability to concentrate.  A change in coordination.  Restless sleep.  Tremors or shakes.  Fainting.  Irritability.  How is this diagnosed? Hypoglycemia is diagnosed with a blood test to measure your blood glucose level. This blood test is done while you are having symptoms. Your health care provider may also do a physical exam and review your medical history. If you do not have diabetes, other tests may be done to find the cause of your hypoglycemia. How is this treated? This condition can often be treated by immediately eating or drinking something that contains glucose, such as:  3-4 sugar tablets (glucose pills).  Glucose gel, 15-gram tube.  Fruit juice, 4 oz (120 mL).  Regular soda (not diet soda), 4 oz (120 mL).  Low-fat milk, 4 oz (120 mL).  Several pieces of hard candy.  Sugar or honey, 1 Tbsp.  Treating Hypoglycemia If You Have Diabetes  If you are alert and able to swallow safely, follow the 15:15 rule:  Take 15 grams of a rapid-acting carbohydrate. Rapid-acting  options include: ? 1 tube of glucose gel. ? 3 glucose pills. ? 6-8 pieces of hard candy. ? 4 oz (120 mL) of fruit juice. ? 4 oz (120 ml) of regular (not diet) soda.  Check your blood glucose 15 minutes after you take the carbohydrate.  If the repeat blood glucose level is still at or below 70 mg/dL (3.9 mmol/L), take 15 grams of a carbohydrate again.  If your blood glucose level  does not increase above 70 mg/dL (3.9 mmol/L) after 3 tries, seek emergency medical care.  After your blood glucose level returns to normal, eat a meal or a snack within 1 hour.  Treating Severe Hypoglycemia Severe hypoglycemia is when your blood glucose level is at or below 54 mg/dL (3 mmol/L). Severe hypoglycemia is an emergency. Do not wait to see if the symptoms will go away. Get medical help right away. Call your local emergency services (911 in the [place] Do not drive yourself to the hospital. If you have severe hypoglycemia and you cannot eat or drink, you may need an injection of glucagon. A family member or close friend should learn how to check your blood glucose and how to give you a glucagon injection. Ask your health care provider if you need to have an emergency glucagon injection kit available. Severe hypoglycemia may need to be treated in a hospital. The treatment may include getting glucose through an IV tube. You may also need treatment for the cause of your hypoglycemia. Follow these instructions at home: General instructions  Avoid any diets that cause you to not eat enough food. Talk with your health care provider before you start any new diet.  Take over-the-counter and prescription medicines only as told by your health care provider.  Limit alcohol intake to no more than 1 drink per day for nonpregnant women and 2 drinks per day for men. One drink equals 12 oz of beer, 5 oz of wine, or 1 oz of hard liquor.  Keep all follow-up visits as told by your health care provider. This is important. If You Have Diabetes:   Make sure you know the symptoms of hypoglycemia.  Always have a rapid-acting carbohydrate snack with you to treat low blood sugar.  Follow your diabetes management plan, as told by your health care provider. Make sure you: ? Take your medicines as directed. ? Follow your exercise plan. ? Follow your meal plan. Eat on time, and do not skip meals. ? Check  your blood glucose as often as directed. Make sure to check your blood glucose before and after exercise. If you exercise longer or in a different way than usual, check your blood glucose more often. ? Follow your sick day plan whenever you cannot eat or drink normally. Make this plan in advance with your health care provider.  Share your diabetes management plan with people in your workplace, school, and household.  Check your urine for ketones when you are ill and as told by your health care provider.  Carry a medical alert card or wear medical alert jewelry. If You Have Reactive Hypoglycemia or Low Blood Sugar From Other Causes:  Monitor your blood glucose as told by your health care provider.  Follow instructions from your health care provider about eating or drinking restrictions. Contact a health care provider if:  You have problems keeping your blood glucose in your target range.  You have frequent episodes of hypoglycemia. Get help right away if:  You continue to have hypoglycemia symptoms  after eating or drinking something containing glucose.  Your blood glucose is at or below 54 mg/dL (3 mmol/L).  You have a seizure.  You faint. These symptoms may represent a serious problem that is an emergency. Do not wait to see if the symptoms will go away. Get medical help right away. Call your local emergency services (911 in the [place] Do not drive yourself to the hospital. This information is not intended to replace advice given to you by your health care provider. Make sure you discuss any questions you have with your health care provider. Document Released: 07/21/2005 Document Revised: 01/02/2016 Document Reviewed: 08/24/2015 Elsevier Interactive Patient Education  2018 [employer]. Insomnia Insomnia is a sleep disorder that makes it difficult to fall asleep or to stay asleep. Insomnia can cause tiredness (fatigue), low energy, difficulty concentrating, mood swings, and poor  performance at work or school. There are three different ways to classify insomnia:  Difficulty falling asleep.  Difficulty staying asleep.  Waking up too early in the morning.  Any type of insomnia can be long-term (chronic) or short-term (acute). Both are common. Short-term insomnia usually lasts for three months or less. Chronic insomnia occurs at least three times a week for longer than three months. What are the causes? Insomnia may be caused by another condition, situation, or substance, such as:  Anxiety.  Certain medicines.  Gastroesophageal reflux disease (GERD) or other gastrointestinal conditions.  Asthma or other breathing conditions.  Restless legs syndrome, sleep apnea, or other sleep disorders.  Chronic pain.  Menopause. This may include hot flashes.  Stroke.  Abuse of alcohol, tobacco, or illegal drugs.  Depression.  Caffeine.  Neurological disorders, such as Alzheimer disease.  An overactive thyroid (hyperthyroidism).  The cause of insomnia may not be known. What increases the risk? Risk factors for insomnia include:  Gender. Women are more commonly affected than men.  Age. Insomnia is more common as you get older.  Stress. This may involve your professional or personal life.  Income. Insomnia is more common in people with lower income.  Lack of exercise.  Irregular work schedule or night shifts.  Traveling between different time zones.  What are the signs or symptoms? If you have insomnia, trouble falling asleep or trouble staying asleep is the main symptom. This may lead to other symptoms, such as:  Feeling fatigued.  Feeling nervous about going to sleep.  Not feeling rested in the morning.  Having trouble concentrating.  Feeling irritable, anxious, or depressed.  How is this treated? Treatment for insomnia depends on the cause. If your insomnia is caused by an underlying condition, treatment will focus on addressing the  condition. Treatment may also include:  Medicines to help you sleep.  Counseling or therapy.  Lifestyle adjustments.  Follow these instructions at home:  Take medicines only as directed by your health care provider.  Keep regular sleeping and waking hours. Avoid naps.  Keep a sleep diary to help you and your health care provider figure out what could be causing your insomnia. Include: ? When you sleep. ? When you wake up during the night. ? How well you sleep. ? How rested you feel the next day. ? Any side effects of medicines you are taking. ? What you eat and drink.  Make your bedroom a comfortable place where it is easy to fall asleep: ? Put up shades or special blackout curtains to block light from outside. ? Use a white noise machine to block noise. ? Keep  the temperature cool.  Exercise regularly as directed by your health care provider. Avoid exercising right before bedtime.  Use relaxation techniques to manage stress. Ask your health care provider to suggest some techniques that may work well for you. These may include: ? Breathing exercises. ? Routines to release muscle tension. ? Visualizing peaceful scenes.  Cut back on alcohol, caffeinated beverages, and cigarettes, especially close to bedtime. These can disrupt your sleep.  Do not overeat or eat spicy foods right before bedtime. This can lead to digestive discomfort that can make it hard for you to sleep.  Limit screen use before bedtime. This includes: ? Watching TV. ? Using your smartphone, tablet, and computer.  Stick to a routine. This can help you fall asleep faster. Try to do a quiet activity, brush your teeth, and go to bed at the same time each night.  Get out of bed if you are still awake after 15 minutes of trying to sleep. Keep the lights down, but try reading or doing a quiet activity. When you feel sleepy, go back to bed.  Make sure that you drive carefully. Avoid driving if you feel very  sleepy.  Keep all follow-up appointments as directed by your health care provider. This is important. Contact a health care provider if:  You are tired throughout the day or have trouble in your daily routine due to sleepiness.  You continue to have sleep problems or your sleep problems get worse. Get help right away if:  You have serious thoughts about hurting yourself or someone else. This information is not intended to replace advice given to you by your health care provider. Make sure you discuss any questions you have with your health care provider. Document Released: 07/18/2000 Document Revised: 12/21/2015 Document Reviewed: 04/21/2014 Elsevier Interactive Patient Education  2018 [employer].    IF you received an x-ray today, you will receive an invoice from [facility]. Please contact [facility] at [phone] with questions or concerns regarding your invoice.   IF you received labwork today, you will receive an invoice from [facility]. Please contact [facility] at [phone] with questions or concerns regarding your invoice.   Our billing staff will not be able to assist you with questions regarding bills from these companies.  You will be contacted with the lab results as soon as they are available. The fastest way to get your results is to activate your My Chart account. Instructions are located on the last page of this paperwork. If you have not heard from [place] regarding the results in 2 weeks, please contact this office.

## 2018-03-03 ENCOUNTER — Ambulatory Visit (HOSPITAL_BASED_OUTPATIENT_CLINIC_OR_DEPARTMENT_OTHER): Payer: Medicare Other | Admitting: Anesthesiology

## 2018-03-03 ENCOUNTER — Ambulatory Visit (HOSPITAL_COMMUNITY): Payer: Medicare Other

## 2018-03-03 ENCOUNTER — Encounter: Payer: Self-pay | Admitting: Family Medicine

## 2018-03-03 ENCOUNTER — Other Ambulatory Visit: Payer: Self-pay

## 2018-03-03 ENCOUNTER — Encounter (HOSPITAL_BASED_OUTPATIENT_CLINIC_OR_DEPARTMENT_OTHER)
Admission: RE | Disposition: A | Discharge status: Home / Self Care | Payer: Self-pay | Source: Ambulatory Visit | Attending: General Surgery

## 2018-03-03 ENCOUNTER — Encounter (HOSPITAL_BASED_OUTPATIENT_CLINIC_OR_DEPARTMENT_OTHER): Payer: Self-pay | Admitting: *Deleted

## 2018-03-03 ENCOUNTER — Ambulatory Visit (HOSPITAL_BASED_OUTPATIENT_CLINIC_OR_DEPARTMENT_OTHER)
Admission: RE | Admit: 2018-03-03 | Discharge: 2018-03-03 | Disposition: A | Discharge status: Home / Self Care | Payer: Medicare Other | Source: Ambulatory Visit | Attending: General Surgery | Admitting: General Surgery

## 2018-03-03 DIAGNOSIS — K8020 Calculus of gallbladder without cholecystitis without obstruction: Secondary | ICD-10-CM | POA: Insufficient documentation

## 2018-03-03 DIAGNOSIS — Z8249 Family history of ischemic heart disease and other diseases of the circulatory system: Secondary | ICD-10-CM | POA: Diagnosis not present

## 2018-03-03 DIAGNOSIS — E119 Type 2 diabetes mellitus without complications: Secondary | ICD-10-CM | POA: Insufficient documentation

## 2018-03-03 DIAGNOSIS — Z86010 Personal history of colonic polyps: Secondary | ICD-10-CM | POA: Insufficient documentation

## 2018-03-03 DIAGNOSIS — R911 Solitary pulmonary nodule: Secondary | ICD-10-CM | POA: Diagnosis not present

## 2018-03-03 DIAGNOSIS — I700 Atherosclerosis of aorta: Secondary | ICD-10-CM | POA: Diagnosis not present

## 2018-03-03 DIAGNOSIS — Z79899 Other long term (current) drug therapy: Secondary | ICD-10-CM | POA: Insufficient documentation

## 2018-03-03 DIAGNOSIS — Z7984 Long term (current) use of oral hypoglycemic drugs: Secondary | ICD-10-CM | POA: Insufficient documentation

## 2018-03-03 DIAGNOSIS — C180 Malignant neoplasm of cecum: Secondary | ICD-10-CM | POA: Insufficient documentation

## 2018-03-03 DIAGNOSIS — Z419 Encounter for procedure for purposes other than remedying health state, unspecified: Secondary | ICD-10-CM

## 2018-03-03 DIAGNOSIS — Z95828 Presence of other vascular implants and grafts: Secondary | ICD-10-CM

## 2018-03-03 HISTORY — PX: PORTACATH PLACEMENT: SHX2246

## 2018-03-03 HISTORY — DX: Anemia, unspecified: D64.9

## 2018-03-03 LAB — GLUCOSE, CAPILLARY
GLUCOSE-CAPILLARY: 207 mg/dL — AB (ref 70–99)
Glucose-Capillary: 250 mg/dL — ABNORMAL HIGH (ref 70–99)

## 2018-03-03 SURGERY — INSERTION, TUNNELED CENTRAL VENOUS DEVICE, WITH PORT
Anesthesia: General | Site: Chest | Laterality: Left

## 2018-03-03 MED ORDER — BUPIVACAINE HCL (PF) 0.25 % IJ SOLN
INTRAMUSCULAR | Status: AC
Start: 2018-03-03 — End: ?
  Filled 2018-03-03: qty 30

## 2018-03-03 MED ORDER — GABAPENTIN 300 MG PO CAPS
ORAL_CAPSULE | ORAL | Status: AC
  Filled 2018-03-03: qty 1

## 2018-03-03 MED ORDER — BUPIVACAINE-EPINEPHRINE 0.25% -1:200000 IJ SOLN
INTRAMUSCULAR | Status: AC
  Filled 2018-03-03: qty 1

## 2018-03-03 MED ORDER — HEPARIN SOD (PORK) LOCK FLUSH 100 UNIT/ML IV SOLN
INTRAVENOUS | Status: DC | PRN
  Administered 2018-03-03: 500 [IU] via INTRAVENOUS

## 2018-03-03 MED ORDER — ACETAMINOPHEN 500 MG PO TABS
1000.0000 mg | ORAL_TABLET | ORAL | Status: AC
  Administered 2018-03-03: 1000 mg via ORAL

## 2018-03-03 MED ORDER — HEPARIN (PORCINE) IN NACL 2-0.9 UNITS/ML
INTRAMUSCULAR | Status: AC | PRN
  Administered 2018-03-03: 1 via INTRAVENOUS

## 2018-03-03 MED ORDER — LACTATED RINGERS IV SOLN
INTRAVENOUS | Status: DC
  Administered 2018-03-03 (×2): via INTRAVENOUS

## 2018-03-03 MED ORDER — CEFAZOLIN SODIUM-DEXTROSE 2-4 GM/100ML-% IV SOLN
INTRAVENOUS | Status: AC
  Filled 2018-03-03: qty 100

## 2018-03-03 MED ORDER — ONDANSETRON HCL 4 MG/2ML IJ SOLN
INTRAMUSCULAR | Status: DC | PRN
  Administered 2018-03-03: 4 mg via INTRAVENOUS

## 2018-03-03 MED ORDER — GABAPENTIN 300 MG PO CAPS
300.0000 mg | ORAL_CAPSULE | ORAL | Status: AC
  Administered 2018-03-03: 300 mg via ORAL

## 2018-03-03 MED ORDER — ACETAMINOPHEN 500 MG PO TABS
ORAL_TABLET | ORAL | Status: AC
  Filled 2018-03-03: qty 2

## 2018-03-03 MED ORDER — HEPARIN SOD (PORK) LOCK FLUSH 100 UNIT/ML IV SOLN
INTRAVENOUS | Status: AC
  Filled 2018-03-03: qty 5

## 2018-03-03 MED ORDER — BUPIVACAINE-EPINEPHRINE (PF) 0.5% -1:200000 IJ SOLN
INTRAMUSCULAR | Status: AC
  Filled 2018-03-03: qty 30

## 2018-03-03 MED ORDER — CHLORHEXIDINE GLUCONATE CLOTH 2 % EX PADS: 6.0000 | MEDICATED_PAD | Freq: Once | CUTANEOUS | Status: DC

## 2018-03-03 MED ORDER — FENTANYL CITRATE (PF) 100 MCG/2ML IJ SOLN
50.0000 ug | INTRAMUSCULAR | Status: DC | PRN
  Administered 2018-03-03: 50 ug via INTRAVENOUS

## 2018-03-03 MED ORDER — FENTANYL CITRATE (PF) 100 MCG/2ML IJ SOLN
INTRAMUSCULAR | Status: AC
  Filled 2018-03-03: qty 2

## 2018-03-03 MED ORDER — HEPARIN (PORCINE) IN NACL 1000-0.9 UT/500ML-% IV SOLN
INTRAVENOUS | Status: AC
Start: 2018-03-03 — End: ?
  Filled 2018-03-03: qty 500

## 2018-03-03 MED ORDER — FENTANYL CITRATE (PF) 100 MCG/2ML IJ SOLN: 25.0000 ug | INTRAMUSCULAR | Status: DC | PRN

## 2018-03-03 MED ORDER — LIDOCAINE HCL (CARDIAC) PF 100 MG/5ML IV SOSY
PREFILLED_SYRINGE | INTRAVENOUS | Status: DC | PRN
  Administered 2018-03-03: 100 mg via INTRAVENOUS

## 2018-03-03 MED ORDER — ONDANSETRON HCL 4 MG/2ML IJ SOLN: 4.0000 mg | Freq: Once | INTRAMUSCULAR | Status: DC | PRN

## 2018-03-03 MED ORDER — DEXAMETHASONE SODIUM PHOSPHATE 10 MG/ML IJ SOLN
INTRAMUSCULAR | Status: DC | PRN
  Administered 2018-03-03: 10 mg via INTRAVENOUS

## 2018-03-03 MED ORDER — BUPIVACAINE-EPINEPHRINE 0.5% -1:200000 IJ SOLN
INTRAMUSCULAR | Status: DC | PRN
  Administered 2018-03-03: 8 mL

## 2018-03-03 MED ORDER — LIDOCAINE-EPINEPHRINE (PF) 1 %-1:200000 IJ SOLN
INTRAMUSCULAR | Status: AC
  Filled 2018-03-03: qty 30

## 2018-03-03 MED ORDER — EPHEDRINE SULFATE 50 MG/ML IJ SOLN
INTRAMUSCULAR | Status: DC | PRN
  Administered 2018-03-03 (×3): 10 mg via INTRAVENOUS

## 2018-03-03 MED ORDER — SCOPOLAMINE 1 MG/3DAYS TD PT72: 1.0000 | MEDICATED_PATCH | Freq: Once | TRANSDERMAL | Status: DC | PRN

## 2018-03-03 MED ORDER — MIDAZOLAM HCL 2 MG/2ML IJ SOLN: 1.0000 mg | INTRAMUSCULAR | Status: DC | PRN

## 2018-03-03 MED ORDER — CEFAZOLIN SODIUM-DEXTROSE 2-4 GM/100ML-% IV SOLN: 2.0000 g | INTRAVENOUS | Status: DC

## 2018-03-03 MED ORDER — PROPOFOL 10 MG/ML IV BOLUS
INTRAVENOUS | Status: DC | PRN
  Administered 2018-03-03: 200 mg via INTRAVENOUS

## 2018-03-03 SURGICAL SUPPLY — 46 items
ADH SKN CLS APL DERMABOND .7 (GAUZE/BANDAGES/DRESSINGS) ×1
BAG DECANTER FOR FLEXI CONT (MISCELLANEOUS) ×3 IMPLANT
BLADE HEX COATED 2.75 (ELECTRODE) ×3 IMPLANT
BLADE SURG 11 STRL SS (BLADE) ×3 IMPLANT
BLADE SURG 15 STRL LF DISP TIS (BLADE) ×1 IMPLANT
BLADE SURG 15 STRL SS (BLADE) ×3
CHLORAPREP W/TINT 26ML (MISCELLANEOUS) ×3 IMPLANT
COVER BACK TABLE 60X90IN (DRAPES) ×3 IMPLANT
COVER MAYO STAND STRL (DRAPES) ×3 IMPLANT
DECANTER SPIKE VIAL GLASS SM (MISCELLANEOUS) IMPLANT
DERMABOND ADVANCED (GAUZE/BANDAGES/DRESSINGS) ×2
DERMABOND ADVANCED .7 DNX12 (GAUZE/BANDAGES/DRESSINGS) ×1 IMPLANT
DRAPE C-ARM 42X72 X-RAY (DRAPES) ×3 IMPLANT
DRAPE LAPAROTOMY TRNSV 102X78 (DRAPE) ×3 IMPLANT
DRAPE UTILITY XL STRL (DRAPES) ×3 IMPLANT
DRSG TEGADERM 4X4.75 (GAUZE/BANDAGES/DRESSINGS) IMPLANT
ELECT REM PT RETURN 9FT ADLT (ELECTROSURGICAL) ×3
ELECTRODE REM PT RTRN 9FT ADLT (ELECTROSURGICAL) ×1 IMPLANT
GAUZE SPONGE 4X4 12PLY STRL LF (GAUZE/BANDAGES/DRESSINGS) IMPLANT
GLOVE BIO SURGEON STRL SZ 6 (GLOVE) ×3 IMPLANT
GLOVE BIO SURGEON STRL SZ 6.5 (GLOVE) ×1 IMPLANT
GLOVE BIO SURGEONS STRL SZ 6.5 (GLOVE) ×1
GLOVE BIOGEL PI IND STRL 6.5 (GLOVE) ×1 IMPLANT
GLOVE BIOGEL PI IND STRL 7.0 (GLOVE) IMPLANT
GLOVE BIOGEL PI INDICATOR 6.5 (GLOVE) ×2
GLOVE BIOGEL PI INDICATOR 7.0 (GLOVE) ×2
GOWN STRL REUS W/ TWL LRG LVL3 (GOWN DISPOSABLE) ×1 IMPLANT
GOWN STRL REUS W/TWL 2XL LVL3 (GOWN DISPOSABLE) ×3 IMPLANT
GOWN STRL REUS W/TWL LRG LVL3 (GOWN DISPOSABLE) ×3
IV CONNECTOR ONE LINK NDLESS (IV SETS) IMPLANT
KIT PORT POWER 8FR ISP CVUE (Port) ×2 IMPLANT
NDL HYPO 25X1 1.5 SAFETY (NEEDLE) ×1 IMPLANT
NEEDLE HYPO 25X1 1.5 SAFETY (NEEDLE) ×3 IMPLANT
PACK BASIN DAY SURGERY FS (CUSTOM PROCEDURE TRAY) ×3 IMPLANT
PENCIL BUTTON HOLSTER BLD 10FT (ELECTRODE) ×3 IMPLANT
SLEEVE SCD COMPRESS KNEE MED (MISCELLANEOUS) ×3 IMPLANT
SUT MNCRL AB 4-0 PS2 18 (SUTURE) ×3 IMPLANT
SUT PROLENE 2 0 SH DA (SUTURE) ×6 IMPLANT
SUT VIC AB 3-0 SH 27 (SUTURE) ×3
SUT VIC AB 3-0 SH 27X BRD (SUTURE) ×1 IMPLANT
SUT VICRYL 3-0 CR8 SH (SUTURE) IMPLANT
SYR 10ML LL (SYRINGE) ×3 IMPLANT
SYR 5ML LUER SLIP (SYRINGE) ×3 IMPLANT
SYR CONTROL 10ML LL (SYRINGE) ×3 IMPLANT
TOWEL GREEN STERILE FF (TOWEL DISPOSABLE) ×3 IMPLANT
TOWEL OR NON WOVEN STRL DISP B (DISPOSABLE) ×3 IMPLANT

## 2018-03-03 NOTE — Op Note (Signed)
PREOPERATIVE DIAGNOSIS:  Cecal cancer, stage IIIB ([id number])     POSTOPERATIVE DIAGNOSIS:  Same     PROCEDURE: Left subclavian port placement, Bard   Power Port, MRI safe, 8-French.      SURGEON:  Stark Klein, MD      ANESTHESIA:  General   FINDINGS:  Good venous return, easy flush, and tip of the catheter and   SVC 23.5 cm.      SPECIMEN:  None.      ESTIMATED BLOOD LOSS:  Minimal.      COMPLICATIONS:  None known.      PROCEDURE:  Pt was identified in the holding area and taken to   the operating room, where patient was placed supine on the operating room   table.  General anesthesia was induced.  Patient's arms were tucked and the upper   chest and neck were prepped and draped in sterile fashion.  Time-out was   performed according to the surgical safety check list.  When all was   correct, we continued.   Local anesthetic was administered over this   area at the angle of the clavicle.  The vein was accessed with 1 pass(es) of the needle. There was good venous return and the wire passed easily with no ectopy.   Fluoroscopy was used to confirm that the wire was in the vena cava.      The patient was placed back level and the area for the pocket was anethetized   with local anesthetic.  A 3-cm transverse incision was made with a #15   blade.  Cautery was used to divide the subcutaneous tissues down to the   pectoralis muscle.  An [employer] retractor was used to elevate the skin   while a pocket was created on top of the pectoralis fascia.  The port   was placed into the pocket to confirm that it was of adequate size.  The   catheter was preattached to the port.  The port was then secured to the   pectoralis fascia with four 2-0 Prolene sutures.  These were clamped and   not tied down yet.    The catheter was tunneled through to the wire exit   site.  The catheter was placed along the wire to determine what length it should be to be in the SVC.  The catheter was cut at 23.5  cm.  The tunneler sheath and dilator were passed over the wire and the dilator and wire were removed.  The catheter was advanced through the tunneler sheath and the tunneler sheath was pulled away.  Care was taken to keep the catheter in the tunneler sheath as this occurred. This was advanced and the tunneler sheath was removed.  There was good venous   return and easy flush of the catheter.  The Prolene sutures were tied   down to the pectoral fascia.  The skin was reapproximated using 3-0   Vicryl interrupted deep dermal sutures.    Fluoroscopy was used to re-confirm good position of the catheter.  The skin   was then closed using 4-0 Monocryl in a subcuticular fashion.  The port was flushed with concentrated heparin flush as well.  The wounds were then cleaned, dried, and dressed with Dermabond.  The patient was awakened from anesthesia and taken to the PACU in stable condition.  Needle, sponge, and instrument counts were correct.               Stark Klein, MD

## 2018-03-03 NOTE — Anesthesia Postprocedure Evaluation (Signed)
Anesthesia Post Note  Patient: William Oneill  Procedure(s) Performed: INSERTION PORT-A-CATH (Left Chest)     Patient location during evaluation: PACU Anesthesia Type: General Level of consciousness: awake and alert Pain management: pain level controlled Vital Signs Assessment: post-procedure vital signs reviewed and stable Respiratory status: spontaneous breathing, nonlabored ventilation and respiratory function stable Cardiovascular status: blood pressure returned to baseline and stable Postop Assessment: no apparent nausea or vomiting Anesthetic complications: no    Last Vitals:  Vitals:   03/03/18 1045 03/03/18 1119  BP: 127/65 136/67  Pulse: 64   Resp: 16 16  Temp:  36.5 C  SpO2: 98% 98%    Last Pain:  Vitals:   03/03/18 1119  TempSrc:   PainSc: 0-No pain                 Catalina Gravel

## 2018-03-03 NOTE — Anesthesia Procedure Notes (Signed)
Procedure Name: LMA Insertion Performed by: Verita Lamb, CRNA Pre-anesthesia Checklist: Patient identified, Emergency Drugs available, Suction available, Patient being monitored and Timeout performed Patient Re-evaluated:Patient Re-evaluated prior to induction Oxygen Delivery Method: Circle system utilized Preoxygenation: Pre-oxygenation with 100% oxygen Induction Type: IV induction LMA: LMA inserted LMA Size: 4.0 Tube type: Oral Number of attempts: 1 Placement Confirmation: positive ETCO2,  CO2 detector and breath sounds checked- equal and bilateral Tube secured with: Tape

## 2018-03-03 NOTE — Transfer of Care (Signed)
Immediate Anesthesia Transfer of Care Note  Patient: William Oneill  Procedure(s) Performed: INSERTION PORT-A-CATH (Left Chest)  Patient Location: PACU  Anesthesia Type:General  Level of Consciousness: awake, alert  and oriented  Airway & Oxygen Therapy: Patient Spontanous Breathing and Patient connected to face mask oxygen  Post-op Assessment: Report given to RN and Post -op Vital signs reviewed and stable  Post vital signs: Reviewed and stable  Last Vitals:  Vitals Value Taken Time  BP 111/60 03/03/2018 10:15 AM  Temp    Pulse 61 03/03/2018 10:16 AM  Resp 13 03/03/2018 10:16 AM  SpO2 100 % 03/03/2018 10:16 AM  Vitals shown include unvalidated device data.  Last Pain:  Vitals:   03/03/18 0755  TempSrc: Oral  PainSc: 0-No pain      Patients Stated Pain Goal: 0 (40/69/86 [id number])  Complications: No apparent anesthesia complications

## 2018-03-03 NOTE — Anesthesia Preprocedure Evaluation (Signed)
Anesthesia Evaluation  Patient identified by MRN, date of birth, ID band Patient awake    Reviewed: Allergy & Precautions, NPO status , Patient's Chart, lab work & pertinent test results  Airway Mallampati: II  TM Distance: >3 FB Neck ROM: Full    Dental  (+) Teeth Intact, Dental Advisory Given   Pulmonary neg pulmonary ROS,    Pulmonary exam normal breath sounds clear to auscultation       Cardiovascular Exercise Tolerance: Good negative cardio ROS Normal cardiovascular exam Rhythm:Regular Rate:Normal     Neuro/Psych negative neurological ROS     GI/Hepatic Neg liver ROS, Colon cancer    Endo/Other  diabetes, Type 2, Oral Hypoglycemic Agents  Renal/GU negative Renal ROS     Musculoskeletal negative musculoskeletal ROS (+)   Abdominal   Peds  Hematology negative hematology ROS (+)   Anesthesia Other Findings Day of surgery medications reviewed with the patient.  Reproductive/Obstetrics                             Anesthesia Physical Anesthesia Plan  ASA: III  Anesthesia Plan: General   Post-op Pain Management:    Induction: Intravenous  PONV Risk Score and Plan: 2 and Dexamethasone and Ondansetron  Airway Management Planned: LMA  Additional Equipment:   Intra-op Plan:   Post-operative Plan: Extubation in OR  Informed Consent: I have reviewed the patients History and Physical, chart, labs and discussed the procedure including the risks, benefits and alternatives for the proposed anesthesia with the patient or authorized representative who has indicated his/her understanding and acceptance.   Dental advisory given  Plan Discussed with: CRNA  Anesthesia Plan Comments:         Anesthesia Quick Evaluation

## 2018-03-03 NOTE — Interval H&P Note (Signed)
History and Physical Interval Note:  03/03/2018 9:09 AM  William Oneill  has presented today for surgery, with the diagnosis of COLON CANCER  The various methods of treatment have been discussed with the patient and family. After consideration of risks, benefits and other options for treatment, the patient has consented to  Procedure(s): INSERTION PORT-A-CATH (N/A) as a surgical intervention .  The patient's history has been reviewed, patient examined, no change in status, stable for surgery.  I have reviewed the patient's chart and labs.  Questions were answered to the patient's satisfaction.     Stark Klein

## 2018-03-03 NOTE — Discharge Instructions (Addendum)
Post Anesthesia Home Care Instructions  Activity: Get plenty of rest for the remainder of the day. A responsible individual must stay with you for 24 hours following the procedure.  For the next 24 hours, DO NOT: -Drive a car -Paediatric nurse -Drink alcoholic beverages -Take any medication unless instructed by your physician -Make any legal decisions or sign important papers.  Meals: Start with liquid foods such as gelatin or soup. Progress to regular foods as tolerated. Avoid greasy, spicy, heavy foods. If nausea and/or vomiting occur, drink only clear liquids until the nausea and/or vomiting subsides. Call your physician if vomiting continues.  Special Instructions/Symptoms: Your throat may feel dry or sore from the anesthesia or the breathing tube placed in your throat during surgery. If this causes discomfort, gargle with warm salt water. The discomfort should disappear within 24 hours.  If you had a scopolamine patch placed behind your ear for the management of post- operative nausea and/or vomiting:  1. The medication in the patch is effective for 72 hours, after which it should be removed.  Wrap patch in a tissue and discard in the trash. Wash hands thoroughly with soap and water. 2. You may remove the patch earlier than 72 hours if you experience unpleasant side effects which may include dry mouth, dizziness or visual disturbances. 3. Avoid touching the patch. Wash your hands with soap and water after contact with the patch.   [place] Office Phone Number [phone]   POST OP INSTRUCTIONS  Always review your discharge instruction sheet given to you by the facility where your surgery was performed.  IF YOU HAVE DISABILITY OR FAMILY LEAVE FORMS, YOU MUST BRING THEM TO THE OFFICE FOR PROCESSING.  DO NOT GIVE THEM TO YOUR DOCTOR.  1. A prescription for pain medication may be given to you upon discharge.  Take your pain medication as prescribed, if needed.   If narcotic pain medicine is not needed, then you may take acetaminophen (Tylenol) or ibuprofen (Advil) as needed. 2. Take your usually prescribed medications unless otherwise directed 3. If you need a refill on your pain medication, please contact your pharmacy.  They will contact our office to request authorization.  Prescriptions will not be filled after 5pm or on week-ends. 4. You should eat very light the first 24 hours after surgery, such as soup, crackers, pudding, etc.  Resume your normal diet the day after surgery 5. It is common to experience some constipation if taking pain medication after surgery.  Increasing fluid intake and taking a stool softener will usually help or prevent this problem from occurring.  A mild laxative (Milk of Magnesia or Miralax) should be taken according to package directions if there are no bowel movements after 48 hours. 6. You may shower in 48 hours.  The surgical glue will flake off in 2-3 weeks.   a. ACTIVITIES:  No strenuous activity or heavy lifting for at least 4 more weeks. b. You may drive when you no longer are taking prescription pain medication, you can comfortably wear a seatbelt, and you can safely maneuver your car and apply brakes. c. RETURN TO WORK:  __________to be determined._______________ Dennis Bast should see your doctor in the office for a follow-up appointment approximately three-four weeks after your surgery.    WHEN TO CALL YOUR DOCTOR: 1. Fever over 101.0 2. Nausea and/or vomiting. 3. Extreme swelling or bruising. 4. Continued bleeding from incision. 5. Increased pain, redness, or drainage from the incision.  The clinic staff is available  to answer your questions during regular business hours.  Please dont hesitate to call and ask to speak to one of the nurses for clinical concerns.  If you have a medical emergency, go to the nearest emergency room or call 911.  A surgeon from [facility] is always on call at the  hospital.  For further questions, please visit [email or web address]

## 2018-03-04 ENCOUNTER — Encounter (HOSPITAL_BASED_OUTPATIENT_CLINIC_OR_DEPARTMENT_OTHER): Payer: Self-pay | Admitting: General Surgery

## 2018-03-07 NOTE — Progress Notes (Signed)
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 Benson Norway 12/17/17  IMPRESSION -An infiltrative sessile and ulcerated non obstructing large mass in the cecum. This was noted to be in the cecal cap and was partially circumferential and 2cm in length. It was not noted to be bleeding.  -There was a 2 sessile polyps that were very close that were 4 to 53m that were not biopsies.  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  -The cecal mass was a poorly differentiated invasive adenocarcinoma.  [facility]  Accession number is [id number]      12/17/2017 Imaging    CT CAP W Contrast 12/17/17  IMPRESSION: 1. Focal area of abnormal enhancement involving the ascending colon may represent mass discovered on recent colonoscopy. 2. No specific findings identified to suggest metastatic disease. 3. 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 Nonspecific. 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 4. Nonspecific 3  mm right upper lobe lung nodule is noted. No follow-up needed if patient is low-risk. Non-contrast chest CT can be considered in 12 months if patient is high-risk. This recommendation follows the consensus statement: Guidelines for Management of Incidental Pulmonary Nodules Detected on CT Images: From the Fleischner Society 2017; Radiology 2017; 284:228-243. 5. 7 mm low-density structure in segment 8 of the liver is too small to characterize. 6.  Aortic Atherosclerosis (ICD10-I70.0). 7. Gallstones.      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 BBarry Dieneson 02/02/18        02/02/2018 Pathology Results    Diagnosis 02/02/18  Colon, segmental resection for tumor, right - INVASIVE COLORECTAL ADENOCARCINOMA, TWO TUMORS, 5.5 AND 1.8 CM. - LARGER, 5.5 CM TUMOR INVOLVES VISCERAL PERITONEUM - SMALLER, 1.8 CM TUMOR INVOLVES SUBMUCOSA. - ONE EXTRAMURAL SATELLITE TUMOR NODULE. - METASTATIC CARCINOMA IN ONE OF TWENTY-NINE LYMPH NODES (1/29). - TWO TUBULAR ADENOMAS, 0.6 AND 0.8 CM. - BENIGN APPENDIX WITH FIBROSIS OF THE LUMEN.       Chemotherapy    Adjuvnat chemo FOLFOX every 2 weeks for 3-6 months       02/02/2018 Cancer Staging    Staging form: Colon and Rectum, AJCC 8th Edition - Pathologic stage from 02/02/2018: Stage IIIB (pT4a, pN1a, cM0) - Signed by [name] MD on 02/20/2018      03/07/2018 -  Chemotherapy    The patient had palonosetron (ALOXI) injection 0.25 mg, 0.25 mg, Intravenous,  Once, 1 of 4 cycles leucovorin 768 mg in dextrose 5 % 250  mL infusion, 400 mg/m2 = 768 mg, Intravenous,  Once, 1 of 4 cycles oxaliplatin (ELOXATIN) 165 mg in dextrose 5 % 500 mL chemo infusion, 85 mg/m2 = 165 mg, Intravenous,  Once, 1 of 4 cycles fluorouracil (ADRUCIL) chemo injection 750 mg, 400 mg/m2 = 750 mg, Intravenous,  Once, 1 of 4 cycles fluorouracil (ADRUCIL) 4,600 mg in sodium chloride 0.9 %  58 mL chemo infusion, 2,400 mg/m2 = 4,600 mg, Intravenous, 1 Day/Dose, 1 of 4 cycles  for chemotherapy treatment.      CURRENT THERAPY: PENDING adjuvant FOLFOX q2 weeks, plan for 3-6 months; to start 8/5  INTERVAL HISTORY: William Oneill returns for follow up and to begin FOLFOX as scheduled. His appetite is normal. Energy level continues to improve. He is able to complete all ADLs and activities without difficulty. Having normal daily BM. No blood in stool. He had PAC placed 7/31 by Dr. Barry Dienes. He tolerated the procedure well. Site is slightly sore, he does not require pain medication. He picked up anti-emetics at pharmacy and attended chemo class. He is diabetic, took all routine meds today. Denies neuropathy at baseline.   REVIEW OF SYSTEMS:   Constitutional: Denies fevers, chills or abnormal weight loss   Ears, nose, mouth, throat, and face: Denies mucositis or sore throat Respiratory: Denies cough, dyspnea or wheezes Cardiovascular: Denies palpitation, chest discomfort or lower extremity swelling Gastrointestinal:  Denies nausea, vomiting, constipation, diarrhea, hematochezia, heartburn or change in bowel habits Skin: Denies abnormal skin rashes Lymphatics: Denies new lymphadenopathy or easy bruising Neurological:Denies numbness, tingling or new weaknesses All other systems were reviewed with the patient and are negative.  MEDICAL HISTORY:  Past Medical History:  Diagnosis Date  . Anemia   . Cancer ([place])   . Colon cancer ([place])   . Diabetes mellitus without complication ([employer])    type 2  . High cholesterol   . History of kidney stones   . Insomnia   . Pneumonia    x1    SURGICAL HISTORY: Past Surgical History:  Procedure Laterality Date  . COLON SURGERY     laparoscopic ascending colectomy  Dr. Barry Dienes 02-02-18  . COLONOSCOPY    . LAPAROSCOPIC PARTIAL COLECTOMY N/A 02/02/2018   Procedure: LAPAROSCOPIC ASCENDING COLECTOMY;  Surgeon: Stark Klein, MD;  Location: [facility];  Service:  General;  Laterality: N/A;  . NO PAST SURGERIES    . PORTACATH PLACEMENT Left 03/03/2018   Procedure: INSERTION PORT-A-CATH;  Surgeon: Stark Klein, MD;  Location: [facility];  Service: General;  Laterality: Left;  . TONSILLECTOMY     as a child    I have reviewed the social history and family history with the patient and they are unchanged from previous note.  ALLERGIES:  has No Known Allergies.  MEDICATIONS:  Current Outpatient Medications  Medication Sig Dispense Refill  . acetaminophen (TYLENOL) 325 MG tablet Take 2 tablets (650 mg total) by mouth every 6 (six) hours as needed.    Marland Kitchen aspirin EC 81 MG tablet Take 81 mg by mouth daily.    Marland Kitchen atorvastatin (LIPITOR) 10 MG tablet Take 1 tablet (10 mg total) by mouth daily. 90 tablet 1  . blood glucose meter kit and supplies Dispense based on patient and insurance preference. Use up to four times daily as directed. (FOR ICD-9 250.00, 250.01). 1 each 0  . ferrous sulfate 325 (65 FE) MG EC tablet Take 1 tablet (325 mg total) by mouth 3 (three) times daily with meals. 30 tablet 2  .  glipiZIDE (GLUCOTROL) 5 MG tablet '10mg'$  by mouth with breakfast, '5mg'$  by mouth with dinner. 270 tablet 1  . glucose blood (ONE TOUCH ULTRA TEST) test strip USE AS DIRECTED TO TEST UP TO 4 TIMES A DAY 100 each 6  . hydrOXYzine (ATARAX/VISTARIL) 25 MG tablet Take 0.5-1 tablets (12.5-25 mg total) by mouth at bedtime as needed (sleep). 30 tablet 0  . ibuprofen (ADVIL,MOTRIN) 600 MG tablet Take 1 tablet (600 mg total) by mouth every 6 (six) hours as needed (pain not controlled with tylenol). 30 tablet 0  . lidocaine-prilocaine (EMLA) cream Apply to affected area once 30 g 3  . metFORMIN (GLUCOPHAGE) 1000 MG tablet Take 1 tablet (1,000 mg total) by mouth 2 (two) times daily with a meal. 180 tablet 1  . Multiple Vitamin (MULTIVITAMIN WITH MINERALS) TABS tablet Take 1 tablet by mouth daily.    Marland Kitchen neomycin (MYCIFRADIN) 500 MG tablet     . ondansetron (ZOFRAN) 8 MG  tablet Take 1 tablet (8 mg total) by mouth 2 (two) times daily as needed for refractory nausea / vomiting. Start on day 3 after chemotherapy. 30 tablet 1  . prochlorperazine (COMPAZINE) 10 MG tablet Take 1 tablet (10 mg total) by mouth every 6 (six) hours as needed (Nausea or vomiting). 30 tablet 1  . sitaGLIPtin (JANUVIA) 100 MG tablet Take 1 tablet (100 mg total) by mouth daily. 90 tablet 1  . metroNIDAZOLE (FLAGYL) 500 MG tablet     . oxyCODONE (OXY IR/ROXICODONE) 5 MG immediate release tablet Take 1 tablet (5 mg total) by mouth every 4 (four) hours as needed for moderate pain or severe pain. 10 tablet 0   No current facility-administered medications for this visit.    Facility-Administered Medications Ordered in Other Visits  Medication Dose Route Frequency Provider Last Rate Last Dose  . fluorouracil (ADRUCIL) 4,600 mg in sodium chloride 0.9 % 58 mL chemo infusion  2,400 mg/m2 (Treatment Plan Recorded) Intravenous 1 day or 1 dose [name], MD      . fluorouracil (ADRUCIL) chemo injection 750 mg  400 mg/m2 (Treatment Plan Recorded) Intravenous Once [name], MD      . heparin lock flush 100 unit/mL  500 Units Intracatheter Once PRN [name], MD      . sodium chloride flush (NS) 0.9 % injection 10 mL  10 mL Intracatheter PRN [name], MD        PHYSICAL EXAMINATION: ECOG PERFORMANCE STATUS: 0 - Asymptomatic  Vitals:   03/08/18 0831  BP: 137/72  Pulse: 69  Resp: 17  Temp: 98.1 F (36.7 C)  SpO2: 100%   Filed Weights   03/08/18 0831  Weight: 179 lb 14.4 oz (81.6 kg)    GENERAL:alert, no distress and comfortable SKIN: no rashes or significant lesions EYES: sclera anicteric   OROPHARYNX:no thrush or ulcers LYMPH:  no palpable cervical or supraclavicular lymphadenopathy LUNGS: clear to auscultation with normal breathing effort HEART: regular rate & rhythm and no murmurs and no lower extremity edema ABDOMEN:abdomen soft, non-tender and normal bowel sounds. Midline abdominal  incision is healing well, no erythema or discharge  Musculoskeletal:no cyanosis of digits and no clubbing  NEURO: alert & oriented x 3 with fluent speech, no focal motor/sensory deficits PAC site is healing well, incision closed, intact; no erythema or drainage   LABORATORY DATA:  I have reviewed the data as listed CBC Latest Ref Rng & Units 03/08/2018 02/05/2018 02/04/2018  WBC 4.0 - 10.3 K/uL 6.6 8.5 11.9(H)  Hemoglobin 13.0 -  17.1 g/dL 9.0(L) 9.0(L) 8.5(L)  Hematocrit 38.4 - 49.9 % 29.2(L) 28.8(L) 27.4(L)  Platelets 140 - 400 K/uL 326 443(H) 374     CMP Latest Ref Rng & Units 03/08/2018 02/05/2018 02/04/2018  Glucose 70 - 99 mg/dL 268(H) 178(H) 270(H)  BUN 8 - 23 mg/dL '14 18 19  '$ Creatinine 0.61 - 1.24 mg/dL 0.93 0.67 0.98  Sodium 135 - 145 mmol/L 138 142 139  Potassium 3.5 - 5.1 mmol/L 4.1 4.0 5.0  Chloride 98 - 111 mmol/L 106 109 108  CO2 22 - 32 mmol/L '24 26 26  '$ Calcium 8.9 - 10.3 mg/dL 8.6(L) 8.2(L) 8.0(L)  Total Protein 6.5 - 8.1 g/dL 6.6 - -  Total Bilirubin 0.3 - 1.2 mg/dL 1.4(H) - -  Alkaline Phos 38 - 126 U/L 77 - -  AST 15 - 41 U/L 15 - -  ALT 0 - 44 U/L 21 - -      RADIOGRAPHIC STUDIES: I have personally reviewed the radiological images as listed and agreed with the findings in the report. No results found.   ASSESSMENT & PLAN: William Oneill is a 72 y.o. Caucasian male with a history of DM.   1. Cecal Cancer, adenocarcinoma, grade 2, pT4aN1aM0, stage IIIB, MSS 2. Anemia of iron deficiency  3. DM 4. 7 mm liver lesion, seen on staging work up; plan to repeat CT upon completion of chemotherapy to follow the lesion   William Oneill appears stable. He tolerated PAC placement and attended chemo education class. We reviewed anti-emetics and symptom management for chemotherapy-related side effects. We reviewed nutrition and potential need for supplement such as glucerna if his appetite decreases. I encouraged him to eat healthy diet, maintain adequate hydration, and stay active. He  denies baseline neuropathy.   Labs reviewed, anemia is stable Hgb 9.0. He takes iron tab 3 times daily. Iron studies are still quite low, serum iron 26, 6% sat, with ferritin of 7. I recommend IV iron x2 doses, to be given with next chemo infusion. I reviewed potential side effects including infusion reaction and anaphylaxis. The patient agrees to proceed. Thrombocytosis resolved. CMP with mild hyperbilirubinemia 1.4; he has had elevated bili with normal transaminases dating back to 2011. Overall labs adequate to proceed with cycle 1, will proceed with FOLFOX without 5FU bolus with cycle 1. Will follow closely. He will return for lab and f/u in 2 weeks with cycle 2.   PLAN: -Labs reviewed -IV Feraheme x2 with next chemo infusions  -Proceed with cycle 1 FOLFOX today, will omit 5FU bolus with cycle 1 -Return for lab, f/u in 2 weeks with cycle 2  All questions were answered. The patient knows to call the clinic with any problems, questions or concerns. No barriers to learning was detected. I spent 20 minutes counseling the patient face to face. The total time spent in the appointment was 25 minutes and more than 50% was on counseling and review of test results     [name], NP 03/08/18

## 2018-03-08 ENCOUNTER — Encounter: Payer: Self-pay | Admitting: Nurse Practitioner

## 2018-03-08 ENCOUNTER — Inpatient Hospital Stay: Payer: Medicare Other | Attending: Hematology

## 2018-03-08 ENCOUNTER — Inpatient Hospital Stay (HOSPITAL_BASED_OUTPATIENT_CLINIC_OR_DEPARTMENT_OTHER): Payer: Medicare Other | Admitting: Nurse Practitioner

## 2018-03-08 ENCOUNTER — Inpatient Hospital Stay: Payer: Medicare Other

## 2018-03-08 ENCOUNTER — Other Ambulatory Visit: Payer: Self-pay | Admitting: Nurse Practitioner

## 2018-03-08 ENCOUNTER — Other Ambulatory Visit: Payer: Self-pay | Admitting: *Deleted

## 2018-03-08 VITALS — BP 137/72 | HR 69 | Temp 98.1°F | Resp 17 | Ht 65.0 in | Wt 179.9 lb

## 2018-03-08 DIAGNOSIS — C180 Malignant neoplasm of cecum: Secondary | ICD-10-CM | POA: Diagnosis present

## 2018-03-08 DIAGNOSIS — D509 Iron deficiency anemia, unspecified: Secondary | ICD-10-CM

## 2018-03-08 DIAGNOSIS — Z5111 Encounter for antineoplastic chemotherapy: Secondary | ICD-10-CM | POA: Insufficient documentation

## 2018-03-08 DIAGNOSIS — D701 Agranulocytosis secondary to cancer chemotherapy: Secondary | ICD-10-CM | POA: Insufficient documentation

## 2018-03-08 DIAGNOSIS — Z452 Encounter for adjustment and management of vascular access device: Secondary | ICD-10-CM | POA: Insufficient documentation

## 2018-03-08 DIAGNOSIS — E119 Type 2 diabetes mellitus without complications: Secondary | ICD-10-CM | POA: Insufficient documentation

## 2018-03-08 LAB — CMP (CANCER CENTER ONLY)
ALK PHOS: 77 U/L (ref 38–126)
ALT: 21 U/L (ref 0–44)
AST: 15 U/L (ref 15–41)
Albumin: 3.6 g/dL (ref 3.5–5.0)
Anion gap: 8 (ref 5–15)
BILIRUBIN TOTAL: 1.4 mg/dL — AB (ref 0.3–1.2)
BUN: 14 mg/dL (ref 8–23)
CALCIUM: 8.6 mg/dL — AB (ref 8.9–10.3)
CO2: 24 mmol/L (ref 22–32)
CREATININE: 0.93 mg/dL (ref 0.61–1.24)
Chloride: 106 mmol/L (ref 98–111)
Glucose, Bld: 268 mg/dL — ABNORMAL HIGH (ref 70–99)
Potassium: 4.1 mmol/L (ref 3.5–5.1)
SODIUM: 138 mmol/L (ref 135–145)
Total Protein: 6.6 g/dL (ref 6.5–8.1)

## 2018-03-08 LAB — CBC WITH DIFFERENTIAL (CANCER CENTER ONLY)
BASOS PCT: 1 %
Basophils Absolute: 0.1 10*3/uL (ref 0.0–0.1)
EOS ABS: 0.5 10*3/uL (ref 0.0–0.5)
Eosinophils Relative: 7 %
HCT: 29.2 % — ABNORMAL LOW (ref 38.4–49.9)
HEMOGLOBIN: 9 g/dL — AB (ref 13.0–17.1)
Lymphocytes Relative: 20 %
Lymphs Abs: 1.3 10*3/uL (ref 0.9–3.3)
MCH: 21.3 pg — ABNORMAL LOW (ref 27.2–33.4)
MCHC: 30.8 g/dL — AB (ref 32.0–36.0)
MCV: 69.2 fL — ABNORMAL LOW (ref 79.3–98.0)
MONOS PCT: 9 %
Monocytes Absolute: 0.6 10*3/uL (ref 0.1–0.9)
NEUTROS PCT: 63 %
Neutro Abs: 4.2 10*3/uL (ref 1.5–6.5)
PLATELETS: 326 10*3/uL (ref 140–400)
RBC: 4.22 MIL/uL (ref 4.20–5.82)
RDW: 16.9 % — ABNORMAL HIGH (ref 11.0–14.6)
WBC Count: 6.6 10*3/uL (ref 4.0–10.3)

## 2018-03-08 LAB — IRON AND TIBC
IRON: 26 ug/dL — AB (ref 42–163)
Saturation Ratios: 6 % — ABNORMAL LOW (ref 42–163)
TIBC: 403 ug/dL (ref 202–409)
UIBC: 377 ug/dL

## 2018-03-08 LAB — CEA (IN HOUSE-CHCC): CEA (CHCC-In House): <1 ng/mL (ref 0.00–5.00)

## 2018-03-08 LAB — FERRITIN: FERRITIN: 7 ng/mL — AB (ref 24–336)

## 2018-03-08 MED ORDER — LEUCOVORIN CALCIUM INJECTION 350 MG
400.0000 mg/m2 | Freq: Once | INTRAVENOUS | Status: AC
  Administered 2018-03-08: 768 mg via INTRAVENOUS
  Filled 2018-03-08: qty 38.4

## 2018-03-08 MED ORDER — DEXTROSE 5 % IV SOLN
Freq: Once | INTRAVENOUS | Status: AC
  Administered 2018-03-08: 09:00:00 via INTRAVENOUS
  Filled 2018-03-08: qty 250

## 2018-03-08 MED ORDER — DEXTROSE 5 % IV SOLN
Freq: Once | INTRAVENOUS | Status: AC
  Administered 2018-03-08: 11:00:00 via INTRAVENOUS
  Filled 2018-03-08: qty 250

## 2018-03-08 MED ORDER — DEXAMETHASONE SODIUM PHOSPHATE 10 MG/ML IJ SOLN
INTRAMUSCULAR | Status: AC
  Filled 2018-03-08: qty 1

## 2018-03-08 MED ORDER — FLUOROURACIL CHEMO INJECTION 2.5 GM/50ML
400.0000 mg/m2 | Freq: Once | INTRAVENOUS | Status: AC
  Administered 2018-03-08: 750 mg via INTRAVENOUS
  Filled 2018-03-08: qty 15

## 2018-03-08 MED ORDER — SODIUM CHLORIDE 0.9% FLUSH
10.0000 mL | INTRAVENOUS | Status: DC | PRN
  Filled 2018-03-08: qty 10

## 2018-03-08 MED ORDER — PALONOSETRON HCL INJECTION 0.25 MG/5ML
INTRAVENOUS | Status: AC
  Filled 2018-03-08: qty 5

## 2018-03-08 MED ORDER — PALONOSETRON HCL INJECTION 0.25 MG/5ML
0.2500 mg | Freq: Once | INTRAVENOUS | Status: AC
  Administered 2018-03-08: 0.25 mg via INTRAVENOUS

## 2018-03-08 MED ORDER — DEXAMETHASONE SODIUM PHOSPHATE 10 MG/ML IJ SOLN
10.0000 mg | Freq: Once | INTRAMUSCULAR | Status: AC
  Administered 2018-03-08: 10 mg via INTRAVENOUS

## 2018-03-08 MED ORDER — HEPARIN SOD (PORK) LOCK FLUSH 100 UNIT/ML IV SOLN
500.0000 [IU] | Freq: Once | INTRAVENOUS | Status: DC | PRN
  Filled 2018-03-08: qty 5

## 2018-03-08 MED ORDER — SODIUM CHLORIDE 0.9 % IV SOLN
2400.0000 mg/m2 | INTRAVENOUS | Status: DC
  Administered 2018-03-08: 4600 mg via INTRAVENOUS
  Filled 2018-03-08: qty 92

## 2018-03-08 MED ORDER — OXALIPLATIN CHEMO INJECTION 100 MG/20ML
85.0000 mg/m2 | Freq: Once | INTRAVENOUS | Status: AC
  Administered 2018-03-08: 165 mg via INTRAVENOUS
  Filled 2018-03-08: qty 33

## 2018-03-08 NOTE — Patient Instructions (Signed)
[facility] Discharge Instructions for Patients Receiving Chemotherapy  Today you received the following chemotherapy agents Oxaliplatin, Leucovorin, 5-Fluorouracil.  To help prevent nausea and vomiting after your treatment, we encourage you to take your nausea medication as directed.  If you develop nausea and vomiting that is not controlled by your nausea medication, call the clinic.   BELOW ARE SYMPTOMS THAT SHOULD BE REPORTED IMMEDIATELY:  *FEVER GREATER THAN 100.5 F  *CHILLS WITH OR WITHOUT FEVER  NAUSEA AND VOMITING THAT IS NOT CONTROLLED WITH YOUR NAUSEA MEDICATION  *UNUSUAL SHORTNESS OF BREATH  *UNUSUAL BRUISING OR BLEEDING  TENDERNESS IN MOUTH AND THROAT WITH OR WITHOUT PRESENCE OF ULCERS  *URINARY PROBLEMS  *BOWEL PROBLEMS  UNUSUAL RASH Items with * indicate a potential emergency and should be followed up as soon as possible.  Feel free to call the clinic should you have any questions or concerns. The clinic phone number is [phone].  Please show the CHEMO ALERT CARD at check-in to the Emergency Department and triage nurse. Oxaliplatin Injection What is this medicine? OXALIPLATIN (ox AL i PLA tin) is a chemotherapy drug. It targets fast dividing cells, like cancer cells, and causes these cells to die. This medicine is used to treat cancers of the colon and rectum, and many other cancers. This medicine may be used for other purposes; ask your health care provider or pharmacist if you have questions. COMMON BRAND NAME(S): Eloxatin What should I tell my health care provider before I take this medicine? They need to know if you have any of these conditions: -kidney disease -an unusual or allergic reaction to oxaliplatin, other chemotherapy, other medicines, foods, dyes, or preservatives -pregnant or trying to get pregnant -breast-feeding How should I use this medicine? This drug is given as an infusion into a vein. It is administered in a hospital  or clinic by a specially trained health care professional. Talk to your pediatrician regarding the use of this medicine in children. Special care may be needed. Overdosage: If you think you have taken too much of this medicine contact a poison control center or emergency room at once. NOTE: This medicine is only for you. Do not share this medicine with others. What if I miss a dose? It is important not to miss a dose. Call your doctor or health care professional if you are unable to keep an appointment. What may interact with this medicine? -medicines to increase blood counts like filgrastim, pegfilgrastim, sargramostim -probenecid -some antibiotics like amikacin, gentamicin, neomycin, polymyxin B, streptomycin, tobramycin -zalcitabine Talk to your doctor or health care professional before taking any of these medicines: -acetaminophen -aspirin -ibuprofen -ketoprofen -naproxen This list may not describe all possible interactions. Give your health care provider a list of all the medicines, herbs, non-prescription drugs, or dietary supplements you use. Also tell them if you smoke, drink alcohol, or use illegal drugs. Some items may interact with your medicine. What should I watch for while using this medicine? Your condition will be monitored carefully while you are receiving this medicine. You will need important blood work done while you are taking this medicine. This medicine can make you more sensitive to cold. Do not drink cold drinks or use ice. Cover exposed skin before coming in contact with cold temperatures or cold objects. When out in cold weather wear warm clothing and cover your mouth and nose to warm the air that goes into your lungs. Tell your doctor if you get sensitive to the cold. This drug may   make you feel generally unwell. This is not uncommon, as chemotherapy can affect healthy cells as well as cancer cells. Report any side effects. Continue your course of treatment even  though you feel ill unless your doctor tells you to stop. In some cases, you may be given additional medicines to help with side effects. Follow all directions for their use. Call your doctor or health care professional for advice if you get a fever, chills or sore throat, or other symptoms of a cold or flu. Do not treat yourself. This drug decreases your body's ability to fight infections. Try to avoid being around people who are sick. This medicine may increase your risk to bruise or bleed. Call your doctor or health care professional if you notice any unusual bleeding. Be careful brushing and flossing your teeth or using a toothpick because you may get an infection or bleed more easily. If you have any dental work done, tell your dentist you are receiving this medicine. Avoid taking products that contain aspirin, acetaminophen, ibuprofen, naproxen, or ketoprofen unless instructed by your doctor. These medicines may hide a fever. Do not become pregnant while taking this medicine. Women should inform their doctor if they wish to become pregnant or think they might be pregnant. There is a potential for serious side effects to an unborn child. Talk to your health care professional or pharmacist for more information. Do not breast-feed an infant while taking this medicine. Call your doctor or health care professional if you get diarrhea. Do not treat yourself. What side effects may I notice from receiving this medicine? Side effects that you should report to your doctor or health care professional as soon as possible: -allergic reactions like skin rash, itching or hives, swelling of the face, lips, or tongue -low blood counts - This drug may decrease the number of white blood cells, red blood cells and platelets. You may be at increased risk for infections and bleeding. -signs of infection - fever or chills, cough, sore throat, pain or difficulty passing urine -signs of decreased platelets or bleeding -  bruising, pinpoint red spots on the skin, black, tarry stools, nosebleeds -signs of decreased red blood cells - unusually weak or tired, fainting spells, lightheadedness -breathing problems -chest pain, pressure -cough -diarrhea -jaw tightness -mouth sores -nausea and vomiting -pain, swelling, redness or irritation at the injection site -pain, tingling, numbness in the hands or feet -problems with balance, talking, walking -redness, blistering, peeling or loosening of the skin, including inside the mouth -trouble passing urine or change in the amount of urine Side effects that usually do not require medical attention (report to your doctor or health care professional if they continue or are bothersome): -changes in vision -constipation -hair loss -loss of appetite -metallic taste in the mouth or changes in taste -stomach pain This list may not describe all possible side effects. Call your doctor for medical advice about side effects. You may report side effects to FDA at 1-800-FDA-1088. Where should I keep my medicine? This drug is given in a hospital or clinic and will not be stored at home. NOTE: This sheet is a summary. It may not cover all possible information. If you have questions about this medicine, talk to your doctor, pharmacist, or health care provider.  2018 Elsevier/Gold Standard (2008-02-15 17:22:47)  Leucovorin injection What is this medicine? LEUCOVORIN (loo koe VOR in) is used to prevent or treat the harmful effects of some medicines. This medicine is used to treat anemia caused by   caused by a low amount of folic acid in the body. It is also used with 5-fluorouracil (5-FU) to treat colon cancer. This medicine may be used for other purposes; ask your health care provider or pharmacist if you have questions. What should I tell my health care provider before I take this medicine? They need to know if you have any of these conditions: -anemia from low levels of vitamin  B-12 in the blood -an unusual or allergic reaction to leucovorin, folic acid, other medicines, foods, dyes, or preservatives -pregnant or trying to get pregnant -breast-feeding How should I use this medicine? This medicine is for injection into a muscle or into a vein. It is given by a health care professional in a hospital or clinic setting. Talk to your pediatrician regarding the use of this medicine in children. Special care may be needed. Overdosage: If you think you have taken too much of this medicine contact a poison control center or emergency room at once. NOTE: This medicine is only for you. Do not share this medicine with others. What if I miss a dose? This does not apply. What may interact with this medicine? -capecitabine -fluorouracil -phenobarbital -phenytoin -primidone -trimethoprim-sulfamethoxazole This list may not describe all possible interactions. Give your health care provider a list of all the medicines, herbs, non-prescription drugs, or dietary supplements you use. Also tell them if you smoke, drink alcohol, or use illegal drugs. Some items may interact with your medicine. What should I watch for while using this medicine? Your condition will be monitored carefully while you are receiving this medicine. This medicine may increase the side effects of 5-fluorouracil, 5-FU. Tell your doctor or health care professional if you have diarrhea or mouth sores that do not get better or that get worse. What side effects may I notice from receiving this medicine? Side effects that you should report to your doctor or health care professional as soon as possible: -allergic reactions like skin rash, itching or hives, swelling of the face, lips, or tongue -breathing problems -fever, infection -mouth sores -unusual bleeding or bruising -unusually weak or tired Side effects that usually do not require medical attention (report to your doctor or health care professional if they  continue or are bothersome): -constipation or diarrhea -loss of appetite -nausea, vomiting This list may not describe all possible side effects. Call your doctor for medical advice about side effects. You may report side effects to FDA at 1-800-FDA-1088. Where should I keep my medicine? This drug is given in a hospital or clinic and will not be stored at home. NOTE: This sheet is a summary. It may not cover all possible information. If you have questions about this medicine, talk to your doctor, pharmacist, or health care provider.  2018 Elsevier/Gold Standard (2008-01-25 16:50:29) Fluorouracil, 5-FU injection What is this medicine? FLUOROURACIL, 5-FU (flure oh YOOR a sil) is a chemotherapy drug. It slows the growth of cancer cells. This medicine is used to treat many types of cancer like breast cancer, colon or rectal cancer, pancreatic cancer, and stomach cancer. This medicine may be used for other purposes; ask your health care provider or pharmacist if you have questions. COMMON BRAND NAME(S): Adrucil What should I tell my health care provider before I take this medicine? They need to know if you have any of these conditions: -blood disorders -dihydropyrimidine dehydrogenase (DPD) deficiency -infection (especially a virus infection such as chickenpox, cold sores, or herpes) -kidney disease -liver disease -malnourished, poor nutrition -recent or ongoing radiation  therapy -an unusual or allergic reaction to fluorouracil, other chemotherapy, other medicines, foods, dyes, or preservatives -pregnant or trying to get pregnant -breast-feeding How should I use this medicine? This drug is given as an infusion or injection into a vein. It is administered in a hospital or clinic by a specially trained health care professional. Talk to your pediatrician regarding the use of this medicine in children. Special care may be needed. Overdosage: If you think you have taken too much of this medicine  contact a poison control center or emergency room at once. NOTE: This medicine is only for you. Do not share this medicine with others. What if I miss a dose? It is important not to miss your dose. Call your doctor or health care professional if you are unable to keep an appointment. What may interact with this medicine? -allopurinol -cimetidine -dapsone -digoxin -hydroxyurea -leucovorin -levamisole -medicines for seizures like ethotoin, fosphenytoin, phenytoin -medicines to increase blood counts like filgrastim, pegfilgrastim, sargramostim -medicines that treat or prevent blood clots like warfarin, enoxaparin, and dalteparin -methotrexate -metronidazole -pyrimethamine -some other chemotherapy drugs like busulfan, cisplatin, estramustine, vinblastine -trimethoprim -trimetrexate -vaccines Talk to your doctor or health care professional before taking any of these medicines: -acetaminophen -aspirin -ibuprofen -ketoprofen -naproxen This list may not describe all possible interactions. Give your health care provider a list of all the medicines, herbs, non-prescription drugs, or dietary supplements you use. Also tell them if you smoke, drink alcohol, or use illegal drugs. Some items may interact with your medicine. What should I watch for while using this medicine? Visit your doctor for checks on your progress. This drug may make you feel generally unwell. This is not uncommon, as chemotherapy can affect healthy cells as well as cancer cells. Report any side effects. Continue your course of treatment even though you feel ill unless your doctor tells you to stop. In some cases, you may be given additional medicines to help with side effects. Follow all directions for their use. Call your doctor or health care professional for advice if you get a fever, chills or sore throat, or other symptoms of a cold or flu. Do not treat yourself. This drug decreases your body's ability to fight  infections. Try to avoid being around people who are sick. This medicine may increase your risk to bruise or bleed. Call your doctor or health care professional if you notice any unusual bleeding. Be careful brushing and flossing your teeth or using a toothpick because you may get an infection or bleed more easily. If you have any dental work done, tell your dentist you are receiving this medicine. Avoid taking products that contain aspirin, acetaminophen, ibuprofen, naproxen, or ketoprofen unless instructed by your doctor. These medicines may hide a fever. Do not become pregnant while taking this medicine. Women should inform their doctor if they wish to become pregnant or think they might be pregnant. There is a potential for serious side effects to an unborn child. Talk to your health care professional or pharmacist for more information. Do not breast-feed an infant while taking this medicine. Men should inform their doctor if they wish to father a child. This medicine may lower sperm counts. Do not treat diarrhea with over the counter products. Contact your doctor if you have diarrhea that lasts more than 2 days or if it is severe and watery. This medicine can make you more sensitive to the sun. Keep out of the sun. If you cannot avoid being in the sun, wear protective  clothing and use sunscreen. Do not use sun lamps or tanning beds/booths. What side effects may I notice from receiving this medicine? Side effects that you should report to your doctor or health care professional as soon as possible: -allergic reactions like skin rash, itching or hives, swelling of the face, lips, or tongue -low blood counts - this medicine may decrease the number of white blood cells, red blood cells and platelets. You may be at increased risk for infections and bleeding. -signs of infection - fever or chills, cough, sore throat, pain or difficulty passing urine -signs of decreased platelets or bleeding - bruising,  pinpoint red spots on the skin, black, tarry stools, blood in the urine -signs of decreased red blood cells - unusually weak or tired, fainting spells, lightheadedness -breathing problems -changes in vision -chest pain -mouth sores -nausea and vomiting -pain, swelling, redness at site where injected -pain, tingling, numbness in the hands or feet -redness, swelling, or sores on hands or feet -stomach pain -unusual bleeding Side effects that usually do not require medical attention (report to your doctor or health care professional if they continue or are bothersome): -changes in finger or toe nails -diarrhea -dry or itchy skin -hair loss -headache -loss of appetite -sensitivity of eyes to the light -stomach upset -unusually teary eyes This list may not describe all possible side effects. Call your doctor for medical advice about side effects. You may report side effects to FDA at 1-800-FDA-1088. Where should I keep my medicine? This drug is given in a hospital or clinic and will not be stored at home. NOTE: This sheet is a summary. It may not cover all possible information. If you have questions about this medicine, talk to your doctor, pharmacist, or health care provider.  2018 Elsevier/Gold Standard (2007-11-24 13:53:16)  

## 2018-03-08 NOTE — Progress Notes (Signed)
Discharge instructions printed and verbally reviewed. Patient and sister verbalized understanding.

## 2018-03-08 NOTE — Progress Notes (Signed)
Met with patient in infusion room prior to start of his first chemo tx.  Encouragement and support provided. Reinforced PAC care and urged pt to call with questions or concerns.

## 2018-03-09 ENCOUNTER — Telehealth: Payer: Self-pay | Admitting: Hematology

## 2018-03-09 NOTE — Telephone Encounter (Signed)
Please alternate LB and YF f/u on existing schedule if possible per 8/5 los / IB message sent to [facility] advising of this also

## 2018-03-10 ENCOUNTER — Inpatient Hospital Stay: Payer: Medicare Other

## 2018-03-10 VITALS — BP 123/61 | HR 66 | Temp 98.1°F | Resp 18

## 2018-03-10 DIAGNOSIS — C180 Malignant neoplasm of cecum: Secondary | ICD-10-CM

## 2018-03-10 DIAGNOSIS — Z5111 Encounter for antineoplastic chemotherapy: Secondary | ICD-10-CM | POA: Diagnosis not present

## 2018-03-10 MED ORDER — SODIUM CHLORIDE 0.9% FLUSH
10.0000 mL | INTRAVENOUS | Status: DC | PRN
  Administered 2018-03-10: 10 mL
  Filled 2018-03-10: qty 10

## 2018-03-10 MED ORDER — HEPARIN SOD (PORK) LOCK FLUSH 100 UNIT/ML IV SOLN
500.0000 [IU] | Freq: Once | INTRAVENOUS | Status: AC | PRN
  Administered 2018-03-10: 500 [IU]
  Filled 2018-03-10: qty 5

## 2018-03-21 NOTE — Progress Notes (Signed)
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 Benson Oneill 12/17/17  IMPRESSION -An infiltrative sessile and ulcerated non obstructing large mass in the cecum. This was noted to be in the cecal cap and was partially circumferential and 2cm in length. It was not noted to be bleeding.  -There was a 2 sessile polyps that were very close that were 4 to 84m that were not biopsies.  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  -The cecal mass was a poorly differentiated invasive adenocarcinoma.  [facility] [place]  Accession number is [id number]    12/17/2017 Imaging    CT CAP W Contrast 12/17/17  IMPRESSION: 1. Focal area of abnormal enhancement involving the ascending colon may represent mass discovered on recent colonoscopy. 2. No specific findings identified to suggest metastatic disease. 3. 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 Nonspecific. 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 4. Nonspecific 3 mm  right upper lobe lung nodule is noted. No follow-up needed if patient is low-risk. Non-contrast chest CT can be considered in 12 months if patient is high-risk. This recommendation follows the consensus statement: Guidelines for Management of Incidental Pulmonary Nodules Detected on CT Images: From the Fleischner Society 2017; Radiology 2017; 284:228-243. 5. 7 mm low-density structure in segment 8 of the liver is too small to characterize. 6.  Aortic Atherosclerosis (ICD10-I70.0). 7. Gallstones.    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 BBarry Dieneson 02/02/18      02/02/2018 Pathology Results    Diagnosis 02/02/18  Colon, segmental resection for tumor, right - INVASIVE COLORECTAL ADENOCARCINOMA, TWO TUMORS, 5.5 AND 1.8 CM. - LARGER, 5.5 CM TUMOR INVOLVES VISCERAL PERITONEUM - SMALLER, 1.8 CM TUMOR INVOLVES SUBMUCOSA. - ONE EXTRAMURAL SATELLITE TUMOR NODULE. - METASTATIC CARCINOMA IN ONE OF TWENTY-NINE LYMPH NODES (1/29). - TWO TUBULAR ADENOMAS, 0.6 AND 0.8 CM. - BENIGN APPENDIX WITH FIBROSIS OF THE LUMEN.     Chemotherapy    Adjuvnat chemo FOLFOX every 2 weeks for 3-6 months     02/02/2018 Cancer Staging    Staging form: Colon and Rectum, AJCC 8th Edition - Pathologic stage from 02/02/2018: Stage IIIB (pT4a, pN1a, cM0) - Signed by [name] MD on 02/20/2018    03/07/2018 -  Chemotherapy    The patient had palonosetron (ALOXI) injection 0.25 mg, 0.25 mg, Intravenous,  Once, 2 of 4 cycles Administration: 0.25 mg (03/08/2018) leucovorin 768 mg in dextrose 5 % 250 mL infusion, 400 mg/m2 = 768 mg, Intravenous,  Once,  2 of 4 cycles Administration: 768 mg (03/08/2018) oxaliplatin (ELOXATIN) 165 mg in dextrose 5 % 500 mL chemo infusion, 85 mg/m2 = 165 mg, Intravenous,  Once, 2 of 4 cycles Administration: 165 mg (03/08/2018) fluorouracil (ADRUCIL) chemo injection 750 mg, 400 mg/m2 = 750 mg,  Intravenous,  Once, 2 of 4 cycles Administration: 750 mg (03/08/2018) fluorouracil (ADRUCIL) 4,600 mg in sodium chloride 0.9 % 58 mL chemo infusion, 2,400 mg/m2 = 4,600 mg, Intravenous, 1 Day/Dose, 2 of 4 cycles Administration: 4,600 mg (03/08/2018)  for chemotherapy treatment.    CURRENT THERAPY: Adjuvant FOLFOX q2 weeks, plan for 3-6 months; began 8/5   INTERVAL HISTORY: William Oneill returns for follow up and cycle 2 FOLFOX as scheduled. He completed cycle 1 on 03/08/18. He felt "exhausted" for 3 days following treatment but remained able to function with ADLs. On day 4 he woke up feeling very well and has been doing good since then. Still has occasional fatigue. Appetite is still recovering but he ate and drank wihtout difficulty. Denies nausea, vomiting, constipation, diarrhea, bleeding, abd pain, mucositis, fever, chills, cold sensitivity, or neuropathy. He takes DM medication routinely, BG ranges 172-212 at home, never over 300. He sees PCP next month.   REVIEW OF SYSTEMS:   Constitutional: Denies fevers, chills or abnormal weight loss (+) fatigue, increased x3 days after chemo (+) improving appetite  Ears, nose, mouth, throat, and face: Denies mucositis or sore throat Respiratory: Denies cough, dyspnea or wheezes Cardiovascular: Denies palpitation, chest discomfort or lower extremity swelling Gastrointestinal:  Denies nausea, vomiting, constipation, diarrhea, heartburn or change in bowel habits Skin: Denies abnormal skin rashes Lymphatics: Denies new lymphadenopathy or easy bruising Neurological:Denies numbness, tingling, cold sensitivity, or new weaknesses Behavioral/Psych: Mood is stable, no new changes  All other systems were reviewed with the patient and are negative.  MEDICAL HISTORY:  Past Medical History:  Diagnosis Date  . Anemia   . Cancer ([place])   . Colon cancer ([place])   . Diabetes mellitus without complication ([place])    type 2  . High cholesterol   . History of kidney stones    . Insomnia   . Pneumonia    x1    SURGICAL HISTORY: Past Surgical History:  Procedure Laterality Date  . COLON SURGERY     laparoscopic ascending colectomy  Dr. Barry Oneill 02-02-18  . COLONOSCOPY    . LAPAROSCOPIC PARTIAL COLECTOMY N/A 02/02/2018   Procedure: LAPAROSCOPIC ASCENDING COLECTOMY;  Surgeon: William Klein, MD;  Location: [facility];  Service: General;  Laterality: N/A;  . NO PAST SURGERIES    . PORTACATH PLACEMENT Left 03/03/2018   Procedure: INSERTION PORT-A-CATH;  Surgeon: William Klein, MD;  Location: [facility];  Service: General;  Laterality: Left;  . TONSILLECTOMY     as a child    I have reviewed the social history and family history with the patient and they are unchanged from previous note.  ALLERGIES:  has No Known Allergies.  MEDICATIONS:  Current Outpatient Medications  Medication Sig Dispense Refill  . acetaminophen (TYLENOL) 325 MG tablet Take 2 tablets (650 mg total) by mouth every 6 (six) hours as needed.    Marland Kitchen aspirin EC 81 MG tablet Take 81 mg by mouth daily.    Marland Kitchen atorvastatin (LIPITOR) 10 MG tablet Take 1 tablet (10 mg total) by mouth daily. 90 tablet 1  . blood glucose meter kit and supplies Dispense based on patient and insurance preference. Use up to four times daily as directed. (FOR ICD-9  250.00, 250.01). 1 each 0  . ferrous sulfate 325 (65 FE) MG EC tablet Take 1 tablet (325 mg total) by mouth 3 (three) times daily with meals. 30 tablet 2  . glipiZIDE (GLUCOTROL) 5 MG tablet 60m by mouth with breakfast, 529mby mouth with dinner. 270 tablet 1  . glucose blood (ONE TOUCH ULTRA TEST) test strip USE AS DIRECTED TO TEST UP TO 4 TIMES A DAY 100 each 6  . hydrOXYzine (ATARAX/VISTARIL) 25 MG tablet Take 0.5-1 tablets (12.5-25 mg total) by mouth at bedtime as needed (sleep). 30 tablet 0  . ibuprofen (ADVIL,MOTRIN) 600 MG tablet Take 1 tablet (600 mg total) by mouth every 6 (six) hours as needed (pain not controlled with tylenol). 30 tablet 0  .  lidocaine-prilocaine (EMLA) cream Apply to affected area once 30 g 3  . metFORMIN (GLUCOPHAGE) 1000 MG tablet Take 1 tablet (1,000 mg total) by mouth 2 (two) times daily with a meal. 180 tablet 1  . metroNIDAZOLE (FLAGYL) 500 MG tablet     . Multiple Vitamin (MULTIVITAMIN WITH MINERALS) TABS tablet Take 1 tablet by mouth daily.    . Marland Kitcheneomycin (MYCIFRADIN) 500 MG tablet     . ondansetron (ZOFRAN) 8 MG tablet Take 1 tablet (8 mg total) by mouth 2 (two) times daily as needed for refractory nausea / vomiting. Start on day 3 after chemotherapy. 30 tablet 1  . oxyCODONE (OXY IR/ROXICODONE) 5 MG immediate release tablet Take 1 tablet (5 mg total) by mouth every 4 (four) hours as needed for moderate pain or severe pain. 10 tablet 0  . prochlorperazine (COMPAZINE) 10 MG tablet Take 1 tablet (10 mg total) by mouth every 6 (six) hours as needed (Nausea or vomiting). 30 tablet 1  . sitaGLIPtin (JANUVIA) 100 MG tablet Take 1 tablet (100 mg total) by mouth daily. 90 tablet 1   No current facility-administered medications for this visit.    Facility-Administered Medications Ordered in Other Visits  Medication Dose Route Frequency Provider Last Rate Last Dose  . 0.9 %  sodium chloride infusion   Intravenous Once BuCira Rue, NP      . dextrose 5 % solution   Intravenous Continuous FeTruitt MerleMD 20 mL/hr at 03/22/18 1407    . fluorouracil (ADRUCIL) 4,600 mg in sodium chloride 0.9 % 58 mL chemo infusion  2,400 mg/m2 (Treatment Plan Recorded) Intravenous 1 day or 1 dose FeTruitt MerleMD      . fluorouracil (ADRUCIL) chemo injection 750 mg  400 mg/m2 (Treatment Plan Recorded) Intravenous Once FeTruitt MerleMD      . leucovorin 768 mg in dextrose 5 % 250 mL infusion  400 mg/m2 (Treatment Plan Recorded) Intravenous Once FeTruitt MerleMD 144 mL/hr at 03/22/18 1411 768 mg at 03/22/18 1411  . oxaliplatin (ELOXATIN) 165 mg in dextrose 5 % 500 mL chemo infusion  85 mg/m2 (Treatment Plan Recorded) Intravenous Once FeTruitt MerleMD  267 mL/hr at 03/22/18 1415 165 mg at 03/22/18 1415    PHYSICAL EXAMINATION: ECOG PERFORMANCE STATUS: 1 - Symptomatic but completely ambulatory  Vitals:   03/22/18 1205  BP: (!) 146/65  Pulse: 61  Resp: 14  Temp: 98 F (36.7 C)  SpO2: 100%   Filed Weights   03/22/18 1205  Weight: 177 lb 14.4 oz (80.7 kg)    GENERAL:alert, no distress and comfortable SKIN:  no rashes or significant lesions EYES: sclera clear OROPHARYNX:no thrush or ulcers  LYMPH:  no palpable cervical or supraclavicular lymphadenopathy LUNGS: distant  breath sounds with normal breathing effort HEART: regular rate & rhythm, no lower extremity edema ABDOMEN:abdomen soft, non-tender and normal bowel sounds. Abdominal incision is well healed  Musculoskeletal:no cyanosis of digits and no clubbing  NEURO: alert & oriented x 3 with fluent speech, no focal motor/sensory deficits PAC healing well, no erythema or drainage    LABORATORY DATA:  I have reviewed the data as listed CBC Latest Ref Rng & Units 03/22/2018 03/08/2018 02/05/2018  WBC 4.0 - 10.3 K/uL 3.2(L) 6.6 8.5  Hemoglobin 13.0 - 17.1 g/dL 9.3(L) 9.0(L) 9.0(L)  Hematocrit 38.4 - 49.9 % 29.5(L) 29.2(L) 28.8(L)  Platelets 140 - 400 K/uL 188 326 443(H)     CMP Latest Ref Rng & Units 03/22/2018 03/08/2018 02/05/2018  Glucose 70 - 99 mg/dL 267(H) 268(H) 178(H)  BUN 8 - 23 mg/dL _0 Creatinine 0.61 - 1.24 mg/dL 0.83 0.93 0.67  Sodium 135 - 145 mmol/L 139 138 142  Potassium 3.5 - 5.1 mmol/L 4.0 4.1 4.0  Chloride 98 - 111 mmol/L 106 106 109  CO2 22 - 32 mmol/L _1 Calcium 8.9 - 10.3 mg/dL 8.4(L) 8.6(L) 8.2(L)  Total Protein 6.5 - 8.1 g/dL 6.2(L) 6.6 -  Total Bilirubin 0.3 - 1.2 mg/dL 1.5(H) 1.4(H) -  Alkaline Phos 38 - 126 U/L 71 77 -  AST 15 - 41 U/L 16 15 -  ALT 0 - 44 U/L 19 21 -      RADIOGRAPHIC STUDIES: I have personally reviewed the radiological images as listed and agreed with the findings in the report. No results found.   ASSESSMENT &  PLAN: William Oneill a 72 y.o.Caucasianmalewith a history of DM.  1. Cecal Cancer,adenocarcinoma, grade 2, pT4aN1aM0,stage IIIB, MSS 2. Anemia of iron deficiency  3. DM 4. 7 mm liver lesion, seen on staging work up; plan to repeat CT upon completion of chemotherapy to follow the lesion   William Oneill is doing well. He completed cycle 1 adjuvant FOLFOX including 5FU bolus. He tolerated well overall with moderate fatigue; he has recovered well. He has mild leukopenia, secondary to chemotherapy. Anemia is stable. CBC and CMP adequate to proceed with cycle 2 today. He will get IV Feraheme with pump d/c on day 3 and again with cycle 3. BG is elevated today, he monitors it at home. I recommend if consistently >200 he call PCP to adjust medication. We reviewed healthy diet. He will return in 2 weeks for f/u and cycle 3.   PLAN: -Labs reviewed, proceed with cycle 2 FOLFOX with 5FU bolus  -IV Feraheme 8/21 with pump d/c and in 2 weeks (with cycle 3) -Return for f/u and cycle 3 in 2 weeks  -Monitor BG, patient to call PCP for medication adjustment  All questions were answered. The patient knows to call the clinic with any problems, questions or concerns. No barriers to learning was detected. I spent 20 minutes counseling the patient face to face. The total time spent in the appointment was 25 minutes and more than 50% was on counseling and review of test results     [name], NP 03/22/18

## 2018-03-22 ENCOUNTER — Encounter: Payer: Self-pay | Admitting: Nurse Practitioner

## 2018-03-22 ENCOUNTER — Inpatient Hospital Stay (HOSPITAL_BASED_OUTPATIENT_CLINIC_OR_DEPARTMENT_OTHER): Payer: Medicare Other

## 2018-03-22 ENCOUNTER — Inpatient Hospital Stay (HOSPITAL_BASED_OUTPATIENT_CLINIC_OR_DEPARTMENT_OTHER): Payer: Medicare Other | Admitting: Nurse Practitioner

## 2018-03-22 ENCOUNTER — Inpatient Hospital Stay: Payer: Medicare Other

## 2018-03-22 VITALS — BP 146/65 | HR 61 | Temp 98.0°F | Resp 14 | Ht 65.0 in | Wt 177.9 lb

## 2018-03-22 DIAGNOSIS — D509 Iron deficiency anemia, unspecified: Secondary | ICD-10-CM

## 2018-03-22 DIAGNOSIS — D701 Agranulocytosis secondary to cancer chemotherapy: Secondary | ICD-10-CM

## 2018-03-22 DIAGNOSIS — C180 Malignant neoplasm of cecum: Secondary | ICD-10-CM

## 2018-03-22 DIAGNOSIS — Z5111 Encounter for antineoplastic chemotherapy: Secondary | ICD-10-CM | POA: Diagnosis not present

## 2018-03-22 DIAGNOSIS — E119 Type 2 diabetes mellitus without complications: Secondary | ICD-10-CM | POA: Diagnosis not present

## 2018-03-22 LAB — CMP (CANCER CENTER ONLY)
ALK PHOS: 71 U/L (ref 38–126)
ALT: 19 U/L (ref 0–44)
AST: 16 U/L (ref 15–41)
Albumin: 3.6 g/dL (ref 3.5–5.0)
Anion gap: 6 (ref 5–15)
BILIRUBIN TOTAL: 1.5 mg/dL — AB (ref 0.3–1.2)
BUN: 18 mg/dL (ref 8–23)
CALCIUM: 8.4 mg/dL — AB (ref 8.9–10.3)
CO2: 27 mmol/L (ref 22–32)
CREATININE: 0.83 mg/dL (ref 0.61–1.24)
Chloride: 106 mmol/L (ref 98–111)
GFR, Est AFR Am: >60 mL/min (ref >60)
GFR, Estimated: >60 mL/min (ref >60)
Glucose, Bld: 267 mg/dL — ABNORMAL HIGH (ref 70–99)
Potassium: 4 mmol/L (ref 3.5–5.1)
Sodium: 139 mmol/L (ref 135–145)
Total Protein: 6.2 g/dL — ABNORMAL LOW (ref 6.5–8.1)

## 2018-03-22 LAB — CBC WITH DIFFERENTIAL (CANCER CENTER ONLY)
BASOS ABS: 0.1 10*3/uL (ref 0.0–0.1)
BASOS PCT: 2 %
EOS ABS: 0.3 10*3/uL (ref 0.0–0.5)
EOS PCT: 8 %
HCT: 29.5 % — ABNORMAL LOW (ref 38.4–49.9)
Hemoglobin: 9.3 g/dL — ABNORMAL LOW (ref 13.0–17.1)
Lymphocytes Relative: 35 %
Lymphs Abs: 1.1 10*3/uL (ref 0.9–3.3)
MCH: 21.7 pg — ABNORMAL LOW (ref 27.2–33.4)
MCHC: 31.4 g/dL — AB (ref 32.0–36.0)
MCV: 69.2 fL — ABNORMAL LOW (ref 79.3–98.0)
MONO ABS: 0.3 10*3/uL (ref 0.1–0.9)
Monocytes Relative: 10 %
Neutro Abs: 1.5 10*3/uL (ref 1.5–6.5)
Neutrophils Relative %: 45 %
PLATELETS: 188 10*3/uL (ref 140–400)
RBC: 4.26 MIL/uL (ref 4.20–5.82)
RDW: 18.9 % — AB (ref 11.0–14.6)
WBC Count: 3.2 10*3/uL — ABNORMAL LOW (ref 4.0–10.3)

## 2018-03-22 MED ORDER — LEUCOVORIN CALCIUM INJECTION 350 MG
400.0000 mg/m2 | Freq: Once | INTRAVENOUS | Status: AC
  Administered 2018-03-22: 768 mg via INTRAVENOUS
  Filled 2018-03-22: qty 38.4

## 2018-03-22 MED ORDER — DEXAMETHASONE SODIUM PHOSPHATE 10 MG/ML IJ SOLN
INTRAMUSCULAR | Status: AC
  Filled 2018-03-22: qty 1

## 2018-03-22 MED ORDER — SODIUM CHLORIDE 0.9% FLUSH
10.0000 mL | Freq: Once | INTRAVENOUS | Status: DC | PRN
  Filled 2018-03-22: qty 10

## 2018-03-22 MED ORDER — PALONOSETRON HCL INJECTION 0.25 MG/5ML
INTRAVENOUS | Status: AC
  Filled 2018-03-22: qty 5

## 2018-03-22 MED ORDER — SODIUM CHLORIDE 0.9 % IV SOLN
Freq: Once | INTRAVENOUS | Status: DC
  Filled 2018-03-22: qty 250

## 2018-03-22 MED ORDER — SODIUM CHLORIDE 0.9 % IV SOLN: 510.0000 mg | Freq: Once | INTRAVENOUS | Status: DC

## 2018-03-22 MED ORDER — HEPARIN SOD (PORK) LOCK FLUSH 100 UNIT/ML IV SOLN
500.0000 [IU] | Freq: Once | INTRAVENOUS | Status: DC | PRN
  Filled 2018-03-22: qty 5

## 2018-03-22 MED ORDER — FLUOROURACIL CHEMO INJECTION 2.5 GM/50ML
400.0000 mg/m2 | Freq: Once | INTRAVENOUS | Status: AC
  Administered 2018-03-22: 750 mg via INTRAVENOUS
  Filled 2018-03-22: qty 15

## 2018-03-22 MED ORDER — DEXTROSE 5 % IV SOLN
INTRAVENOUS | Status: AC
  Administered 2018-03-22: 14:00:00 via INTRAVENOUS
  Filled 2018-03-22: qty 250

## 2018-03-22 MED ORDER — DEXAMETHASONE SODIUM PHOSPHATE 10 MG/ML IJ SOLN
10.0000 mg | Freq: Once | INTRAMUSCULAR | Status: AC
  Administered 2018-03-22: 10 mg via INTRAVENOUS

## 2018-03-22 MED ORDER — PALONOSETRON HCL INJECTION 0.25 MG/5ML
0.2500 mg | Freq: Once | INTRAVENOUS | Status: AC
  Administered 2018-03-22: 0.25 mg via INTRAVENOUS

## 2018-03-22 MED ORDER — DEXTROSE 5 % IV SOLN
Freq: Once | INTRAVENOUS | Status: AC
  Administered 2018-03-22: 13:00:00 via INTRAVENOUS
  Filled 2018-03-22: qty 250

## 2018-03-22 MED ORDER — OXALIPLATIN CHEMO INJECTION 100 MG/20ML
85.0000 mg/m2 | Freq: Once | INTRAVENOUS | Status: AC
  Administered 2018-03-22: 165 mg via INTRAVENOUS
  Filled 2018-03-22: qty 33

## 2018-03-22 MED ORDER — SODIUM CHLORIDE 0.9 % IV SOLN
2400.0000 mg/m2 | INTRAVENOUS | Status: DC
  Administered 2018-03-22: 4600 mg via INTRAVENOUS
  Filled 2018-03-22: qty 92

## 2018-03-22 NOTE — Progress Notes (Signed)
Leucovorin not infusing during first hour of oxaliplatin infusion. DR. Burr Medico ok for rate of leucovorin to be doubled to run over an hour to finish simultaneously with oxaliplatin.

## 2018-03-22 NOTE — Patient Instructions (Signed)
[facility] Discharge Instructions for Patients Receiving Chemotherapy  Today you received the following chemotherapy agents Oxaliplatin, Leucovorin, 5-Fluorouracil.  To help prevent nausea and vomiting after your treatment, we encourage you to take your nausea medication as directed.  If you develop nausea and vomiting that is not controlled by your nausea medication, call the clinic.   BELOW ARE SYMPTOMS THAT SHOULD BE REPORTED IMMEDIATELY:  *FEVER GREATER THAN 100.5 F  *CHILLS WITH OR WITHOUT FEVER  NAUSEA AND VOMITING THAT IS NOT CONTROLLED WITH YOUR NAUSEA MEDICATION  *UNUSUAL SHORTNESS OF BREATH  *UNUSUAL BRUISING OR BLEEDING  TENDERNESS IN MOUTH AND THROAT WITH OR WITHOUT PRESENCE OF ULCERS  *URINARY PROBLEMS  *BOWEL PROBLEMS  UNUSUAL RASH Items with * indicate a potential emergency and should be followed up as soon as possible.  Feel free to call the clinic should you have any questions or concerns. The clinic phone number is [phone].  Please show the CHEMO ALERT CARD at check-in to the Emergency Department and triage nurse.    

## 2018-03-24 ENCOUNTER — Inpatient Hospital Stay: Payer: Medicare Other

## 2018-03-24 ENCOUNTER — Telehealth: Payer: Self-pay | Admitting: Nurse Practitioner

## 2018-03-24 VITALS — BP 116/66 | HR 74 | Temp 98.5°F | Resp 16

## 2018-03-24 DIAGNOSIS — Z5111 Encounter for antineoplastic chemotherapy: Secondary | ICD-10-CM | POA: Diagnosis not present

## 2018-03-24 DIAGNOSIS — C180 Malignant neoplasm of cecum: Secondary | ICD-10-CM

## 2018-03-24 MED ORDER — HEPARIN SOD (PORK) LOCK FLUSH 100 UNIT/ML IV SOLN
500.0000 [IU] | Freq: Once | INTRAVENOUS | Status: AC | PRN
  Administered 2018-03-24: 500 [IU]
  Filled 2018-03-24: qty 5

## 2018-03-24 MED ORDER — SODIUM CHLORIDE 0.9 % IV SOLN
Freq: Once | INTRAVENOUS | Status: AC
  Administered 2018-03-24: 15:00:00 via INTRAVENOUS
  Filled 2018-03-24: qty 250

## 2018-03-24 MED ORDER — SODIUM CHLORIDE 0.9 % IV SOLN
510.0000 mg | Freq: Once | INTRAVENOUS | Status: AC
  Administered 2018-03-24: 510 mg via INTRAVENOUS
  Filled 2018-03-24: qty 17

## 2018-03-24 MED ORDER — SODIUM CHLORIDE 0.9% FLUSH
10.0000 mL | INTRAVENOUS | Status: DC | PRN
  Administered 2018-03-24: 10 mL
  Filled 2018-03-24: qty 10

## 2018-03-24 NOTE — Telephone Encounter (Signed)
Patient scheduled per 8/19 sch message.  Patient aware of D&t.

## 2018-03-24 NOTE — Patient Instructions (Signed)

## 2018-04-05 NOTE — Progress Notes (Signed)
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 Benson Norway 12/17/17  IMPRESSION -An infiltrative sessile and ulcerated non obstructing large mass in the cecum. This was noted to be in the cecal cap and was partially circumferential and 2cm in length. It was not noted to be bleeding.  -There was a 2 sessile polyps that were very close that were 4 to 57m that were not biopsies.  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  -The cecal mass was a poorly differentiated invasive adenocarcinoma.  [facility] [place]  Accession number is [id number]    12/17/2017 Imaging    CT CAP W Contrast 12/17/17  IMPRESSION: 1. Focal area of abnormal enhancement involving the ascending colon may represent mass discovered on recent colonoscopy. 2. No specific findings identified to suggest metastatic disease. 3. 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 Nonspecific. 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 4. Nonspecific 3 mm  right upper lobe lung nodule is noted. No follow-up needed if patient is low-risk. Non-contrast chest CT can be considered in 12 months if patient is high-risk. This recommendation follows the consensus statement: Guidelines for Management of Incidental Pulmonary Nodules Detected on CT Images: From the Fleischner Society 2017; Radiology 2017; 284:228-243. 5. 7 mm low-density structure in segment 8 of the liver is too small to characterize. 6.  Aortic Atherosclerosis (ICD10-I70.0). 7. Gallstones.    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 BBarry Dieneson 02/02/18      02/02/2018 Pathology Results    Diagnosis 02/02/18  Colon, segmental resection for tumor, right - INVASIVE COLORECTAL ADENOCARCINOMA, TWO TUMORS, 5.5 AND 1.8 CM. - LARGER, 5.5 CM TUMOR INVOLVES VISCERAL PERITONEUM - SMALLER, 1.8 CM TUMOR INVOLVES SUBMUCOSA. - ONE EXTRAMURAL SATELLITE TUMOR NODULE. - METASTATIC CARCINOMA IN ONE OF TWENTY-NINE LYMPH NODES (1/29). - TWO TUBULAR ADENOMAS, 0.6 AND 0.8 CM. - BENIGN APPENDIX WITH FIBROSIS OF THE LUMEN.     Chemotherapy    Adjuvnat chemo FOLFOX every 2 weeks for 3-6 months     02/02/2018 Cancer Staging    Staging form: Colon and Rectum, AJCC 8th Edition - Pathologic stage from 02/02/2018: Stage IIIB (pT4a, pN1a, cM0) - Signed by [name] MD on 02/20/2018    03/07/2018 -  Chemotherapy    The patient had palonosetron (ALOXI) injection 0.25 mg, 0.25 mg, Intravenous,  Once, 2 of 4 cycles Administration: 0.25 mg (03/08/2018), 0.25 mg (03/22/2018) pegfilgrastim-cbqv (UDENYCA) injection 6 mg, 6 mg, Subcutaneous, Once, 0 of 2 cycles leucovorin 768  mg in dextrose 5 % 250 mL infusion, 400 mg/m2 = 768 mg, Intravenous,  Once, 2 of 4 cycles Administration: 768 mg (03/08/2018), 768 mg (03/22/2018) oxaliplatin (ELOXATIN) 165 mg in dextrose 5 % 500 mL chemo infusion, 85 mg/m2 = 165 mg, Intravenous,   Once, 2 of 4 cycles Administration: 165 mg (03/08/2018), 165 mg (03/22/2018) fluorouracil (ADRUCIL) chemo injection 750 mg, 400 mg/m2 = 750 mg, Intravenous,  Once, 2 of 4 cycles Administration: 750 mg (03/08/2018), 750 mg (03/22/2018) fluorouracil (ADRUCIL) 4,600 mg in sodium chloride 0.9 % 58 mL chemo infusion, 2,400 mg/m2 = 4,600 mg, Intravenous, 1 Day/Dose, 2 of 4 cycles Administration: 4,600 mg (03/08/2018), 4,600 mg (03/22/2018)  for chemotherapy treatment.    CURRENT THERAPY Adjuvant FOLFOX q2 weeks, plan for 3-6 months; began 8/5  INTERVAL HISTORY: William Oneill returns for follow up and cycle 3 FOLFOX as scheduled. He completed cycle 2 on 03/22/18. He received IV Feraheme with pump d/c on 8/21. He tolerated cycle 2 and IV iron without difficulty. His fatigue improved, lasting 2 days. Remained able to perform all ADLs. Appetite is slightly lower, and po intake fluctuates. He lost 6 lbs. Has not tried supplements. Denies mucositis, n/v/c/d, GI bleeding, dysuria, cough, chest pain, dyspnea, fever, chills, pain, or neuropathy.    MEDICAL HISTORY:  Past Medical History:  Diagnosis Date  . Anemia   . Cancer ([place])   . Colon cancer ([place])   . Diabetes mellitus without complication ([place])    type 2  . High cholesterol   . History of kidney stones   . Insomnia   . Pneumonia    x1    SURGICAL HISTORY: Past Surgical History:  Procedure Laterality Date  . COLON SURGERY     laparoscopic ascending colectomy  Dr. Barry Dienes 02-02-18  . COLONOSCOPY    . LAPAROSCOPIC PARTIAL COLECTOMY N/A 02/02/2018   Procedure: LAPAROSCOPIC ASCENDING COLECTOMY;  Surgeon: Stark Klein, MD;  Location: [facility];  Service: General;  Laterality: N/A;  . NO PAST SURGERIES    . PORTACATH PLACEMENT Left 03/03/2018   Procedure: INSERTION PORT-A-CATH;  Surgeon: Stark Klein, MD;  Location: [facility];  Service: General;  Laterality: Left;  . TONSILLECTOMY     as a child    I have reviewed the social history and  family history with the patient and they are unchanged from previous note.  ALLERGIES:  has No Known Allergies.  MEDICATIONS:  Current Outpatient Medications  Medication Sig Dispense Refill  . acetaminophen (TYLENOL) 325 MG tablet Take 2 tablets (650 mg total) by mouth every 6 (six) hours as needed.    Marland Kitchen aspirin EC 81 MG tablet Take 81 mg by mouth daily.    Marland Kitchen atorvastatin (LIPITOR) 10 MG tablet Take 1 tablet (10 mg total) by mouth daily. 90 tablet 1  . blood glucose meter kit and supplies Dispense based on patient and insurance preference. Use up to four times daily as directed. (FOR ICD-9 250.00, 250.01). 1 each 0  . glipiZIDE (GLUCOTROL) 5 MG tablet 2m by mouth with breakfast, 584mby mouth with dinner. 270 tablet 1  . glucose blood (ONE TOUCH ULTRA TEST) test strip USE AS DIRECTED TO TEST UP TO 4 TIMES A DAY 100 each 6  . hydrOXYzine (ATARAX/VISTARIL) 25 MG tablet Take 0.5-1 tablets (12.5-25 mg total) by mouth at bedtime as needed (sleep). 30 tablet 0  . ibuprofen (ADVIL,MOTRIN) 600 MG tablet Take 1 tablet (600 mg total) by mouth every 6 (six) hours as needed (  pain not controlled with tylenol). 30 tablet 0  . lidocaine-prilocaine (EMLA) cream Apply to affected area once 30 g 3  . metFORMIN (GLUCOPHAGE) 1000 MG tablet Take 1 tablet (1,000 mg total) by mouth 2 (two) times daily with a meal. 180 tablet 1  . Multiple Vitamin (MULTIVITAMIN WITH MINERALS) TABS tablet Take 1 tablet by mouth daily.    Marland Kitchen neomycin (MYCIFRADIN) 500 MG tablet     . ondansetron (ZOFRAN) 8 MG tablet Take 1 tablet (8 mg total) by mouth 2 (two) times daily as needed for refractory nausea / vomiting. Start on day 3 after chemotherapy. 30 tablet 1  . prochlorperazine (COMPAZINE) 10 MG tablet Take 1 tablet (10 mg total) by mouth every 6 (six) hours as needed (Nausea or vomiting). 30 tablet 1  . sitaGLIPtin (JANUVIA) 100 MG tablet Take 1 tablet (100 mg total) by mouth daily. 90 tablet 1  . ferrous sulfate 325 (65 FE) MG EC  tablet Take 1 tablet (325 mg total) by mouth 3 (three) times daily with meals. 30 tablet 2  . metroNIDAZOLE (FLAGYL) 500 MG tablet     . oxyCODONE (OXY IR/ROXICODONE) 5 MG immediate release tablet Take 1 tablet (5 mg total) by mouth every 4 (four) hours as needed for moderate pain or severe pain. 10 tablet 0   No current facility-administered medications for this visit.     PHYSICAL EXAMINATION: ECOG PERFORMANCE STATUS: 1 - Symptomatic but completely ambulatory  Vitals:   04/06/18 0831  BP: (!) 143/71  Pulse: 64  Resp: 17  Temp: 97.9 F (36.6 C)  SpO2: 100%   Filed Weights   04/06/18 0831  Weight: 171 lb 14.4 oz (78 kg)    GENERAL:alert, no distress and comfortable SKIN: no rashes or significant lesions EYES: sclera clear OROPHARYNX:no thrush or ulcers  LYMPH:  no palpable cervical or supraclavicular lymphadenopathy LUNGS: clear to auscultation with normal breathing effort HEART: regular rate & rhythm, no lower extremity edema ABDOMEN:abdomen soft, non-tender and normal bowel sounds Musculoskeletal:no cyanosis of digits and no clubbing  NEURO: alert & oriented x 3 with fluent speech, no focal motor/sensory deficits PAC without erythema   LABORATORY DATA:  I have reviewed the data as listed CBC Latest Ref Rng & Units 04/06/2018 03/22/2018 03/08/2018  WBC 4.0 - 10.3 K/uL 1.8(L) 3.2(L) 6.6  Hemoglobin 13.0 - 17.1 g/dL 10.7(L) 9.3(L) 9.0(L)  Hematocrit 38.4 - 49.9 % 34.0(L) 29.5(L) 29.2(L)  Platelets 140 - 400 K/uL 153 188 326     CMP Latest Ref Rng & Units 04/06/2018 03/22/2018 03/08/2018  Glucose 70 - 99 mg/dL 240(H) 267(H) 268(H)  BUN 8 - 23 mg/dL _0 Creatinine 0.61 - 1.24 mg/dL 0.89 0.83 0.93  Sodium 135 - 145 mmol/L 142 139 138  Potassium 3.5 - 5.1 mmol/L 4.0 4.0 4.1  Chloride 98 - 111 mmol/L 109 106 106  CO2 22 - 32 mmol/L _1 Calcium 8.9 - 10.3 mg/dL 9.0 8.4(L) 8.6(L)  Total Protein 6.5 - 8.1 g/dL 6.2(L) 6.2(L) 6.6  Total Bilirubin 0.3 - 1.2 mg/dL 1.8(H)  1.5(H) 1.4(H)  Alkaline Phos 38 - 126 U/L 78 71 77  AST 15 - 41 U/L _2 ALT 0 - 44 U/L _3 RADIOGRAPHIC STUDIES: I have personally reviewed the radiological images as listed and agreed with the findings in the report. No results found.   ASSESSMENT & PLAN: William Oneill a 72 y.o.Caucasianmalewith a history of DM.  1. Cecal Cancer,adenocarcinoma, grade 2, pT4aN1aM0,stage IIIB, MSS 2. Anemia of iron deficiency  3. DM 4. 7 mm liver lesion, seen on staging work up; plan to repeat CT upon completion of chemotherapy to follow the lesion 5. Chemotherapy-induced neutropenia   William Oneill appears stable. He completed cycle 2 adjuvant FOLFOX. He tolerated treatment well overall. He developed neutropenia, ANC 0.5 today. Afebrile. We reviewed neutropenic precautions. Will delay chemo 1 week, and add Udenyca with day 3 pump d/c with next cycle. We reviewed potential side effects, including flu-like symptoms, bone pain, and rare splenic rupture. He agrees.   He has lost more weight, I recommend adding glucerna and increasing po intake. If he continues to lose weight will refer him to nutrition and discuss appetite stimulant medication. CMP reviewed. Bili remains mildly elevated. BG 240, he has appt with PCP on 9/12 for DM f/u.   He will return for lab and chemo in 1 week, and f/u in 3 weeks with cycle 4. Will re-scan after treatment to monitor liver lesion. The plan was reviewed with Dr. Burr Medico.    PLAN: -Labs reviewed, ANC 0.5 - hold chemotherapy, r/s for 1 week -Add Udenyca on day 3 -Reviewed neutropenic precautions -Add glucerna, increase po intake  -Return in 1 week for cycle 3 and IV Feraheme -F/u in 3 weeks with cycle 4  All questions were answered. The patient knows to call the clinic with any problems, questions or concerns. No barriers to learning was detected. I spent 20 minutes counseling the patient face to face. The total time spent in the appointment was 25  minutes and more than 50% was on counseling and review of test results.     [name], NP 04/06/18

## 2018-04-06 ENCOUNTER — Inpatient Hospital Stay: Payer: Medicare Other | Attending: Nurse Practitioner | Admitting: Nurse Practitioner

## 2018-04-06 ENCOUNTER — Inpatient Hospital Stay: Payer: Medicare Other

## 2018-04-06 ENCOUNTER — Telehealth: Payer: Self-pay

## 2018-04-06 ENCOUNTER — Encounter: Payer: Self-pay | Admitting: Nurse Practitioner

## 2018-04-06 VITALS — BP 143/71 | HR 64 | Temp 97.9°F | Resp 17 | Ht 65.0 in | Wt 171.9 lb

## 2018-04-06 DIAGNOSIS — Z452 Encounter for adjustment and management of vascular access device: Secondary | ICD-10-CM | POA: Insufficient documentation

## 2018-04-06 DIAGNOSIS — Z5111 Encounter for antineoplastic chemotherapy: Secondary | ICD-10-CM | POA: Diagnosis present

## 2018-04-06 DIAGNOSIS — D701 Agranulocytosis secondary to cancer chemotherapy: Secondary | ICD-10-CM | POA: Diagnosis not present

## 2018-04-06 DIAGNOSIS — C180 Malignant neoplasm of cecum: Secondary | ICD-10-CM | POA: Diagnosis not present

## 2018-04-06 DIAGNOSIS — T451X5A Adverse effect of antineoplastic and immunosuppressive drugs, initial encounter: Secondary | ICD-10-CM

## 2018-04-06 DIAGNOSIS — Z95828 Presence of other vascular implants and grafts: Secondary | ICD-10-CM

## 2018-04-06 DIAGNOSIS — Z5189 Encounter for other specified aftercare: Secondary | ICD-10-CM | POA: Insufficient documentation

## 2018-04-06 DIAGNOSIS — D509 Iron deficiency anemia, unspecified: Secondary | ICD-10-CM | POA: Diagnosis not present

## 2018-04-06 DIAGNOSIS — E119 Type 2 diabetes mellitus without complications: Secondary | ICD-10-CM

## 2018-04-06 LAB — CMP (CANCER CENTER ONLY)
ALK PHOS: 78 U/L (ref 38–126)
ALT: 29 U/L (ref 0–44)
AST: 25 U/L (ref 15–41)
Albumin: 3.7 g/dL (ref 3.5–5.0)
Anion gap: 8 (ref 5–15)
BUN: 18 mg/dL (ref 8–23)
CO2: 25 mmol/L (ref 22–32)
CREATININE: 0.89 mg/dL (ref 0.61–1.24)
Calcium: 9 mg/dL (ref 8.9–10.3)
Chloride: 109 mmol/L (ref 98–111)
Glucose, Bld: 240 mg/dL — ABNORMAL HIGH (ref 70–99)
Potassium: 4 mmol/L (ref 3.5–5.1)
SODIUM: 142 mmol/L (ref 135–145)
Total Bilirubin: 1.8 mg/dL — ABNORMAL HIGH (ref 0.3–1.2)
Total Protein: 6.2 g/dL — ABNORMAL LOW (ref 6.5–8.1)

## 2018-04-06 LAB — CBC WITH DIFFERENTIAL (CANCER CENTER ONLY)
Basophils Absolute: 0.1 10*3/uL (ref 0.0–0.1)
Basophils Relative: 3 %
EOS ABS: 0.1 10*3/uL (ref 0.0–0.5)
Eosinophils Relative: 3 %
HEMATOCRIT: 34 % — AB (ref 38.4–49.9)
Hemoglobin: 10.7 g/dL — ABNORMAL LOW (ref 13.0–17.1)
LYMPHS ABS: 0.9 10*3/uL (ref 0.9–3.3)
LYMPHS PCT: 52 %
MCH: 23.3 pg — AB (ref 27.2–33.4)
MCHC: 31.5 g/dL — AB (ref 32.0–36.0)
MCV: 73.9 fL — AB (ref 79.3–98.0)
MONOS PCT: 16 %
Monocytes Absolute: 0.3 10*3/uL (ref 0.1–0.9)
Neutro Abs: 0.5 10*3/uL — ABNORMAL LOW (ref 1.5–6.5)
Neutrophils Relative %: 26 %
Platelet Count: 153 10*3/uL (ref 140–400)
RBC: 4.6 MIL/uL (ref 4.20–5.82)
RDW: 22.4 % — ABNORMAL HIGH (ref 11.0–14.6)
WBC Count: 1.8 10*3/uL — ABNORMAL LOW (ref 4.0–10.3)

## 2018-04-06 LAB — IRON AND TIBC
Iron: 61 ug/dL (ref 42–163)
SATURATION RATIOS: 17 % — AB (ref 42–163)
TIBC: 358 ug/dL (ref 202–409)
UIBC: 297 ug/dL

## 2018-04-06 LAB — CEA (IN HOUSE-CHCC): CEA (CHCC-IN HOUSE): 1.63 ng/mL (ref 0.00–5.00)

## 2018-04-06 LAB — FERRITIN: Ferritin: 253 ng/mL (ref 24–336)

## 2018-04-06 MED ORDER — SODIUM CHLORIDE 0.9% FLUSH
10.0000 mL | Freq: Once | INTRAVENOUS | Status: AC | PRN
  Administered 2018-04-06: 10 mL
  Filled 2018-04-06: qty 10

## 2018-04-06 MED ORDER — SODIUM CHLORIDE 0.9% FLUSH
10.0000 mL | INTRAVENOUS | Status: DC | PRN
  Administered 2018-04-06: 10 mL via INTRAVENOUS
  Filled 2018-04-06: qty 10

## 2018-04-06 MED ORDER — HEPARIN SOD (PORK) LOCK FLUSH 100 UNIT/ML IV SOLN
500.0000 [IU] | Freq: Once | INTRAVENOUS | Status: AC | PRN
  Administered 2018-04-06: 500 [IU]
  Filled 2018-04-06: qty 5

## 2018-04-06 NOTE — Telephone Encounter (Signed)
Printed avs and calender of upcoming appointment. Per 9/3 los 

## 2018-04-08 ENCOUNTER — Inpatient Hospital Stay: Payer: Medicare Other

## 2018-04-08 NOTE — Patient Instructions (Addendum)
Pegfilgrastim injection W

## 2018-04-13 ENCOUNTER — Inpatient Hospital Stay: Payer: Medicare Other

## 2018-04-13 ENCOUNTER — Other Ambulatory Visit: Payer: Self-pay | Admitting: Hematology

## 2018-04-13 ENCOUNTER — Ambulatory Visit: Payer: Medicare Other | Admitting: Family Medicine

## 2018-04-13 VITALS — BP 145/86 | HR 66 | Temp 97.8°F | Resp 18 | Ht 65.0 in | Wt 173.5 lb

## 2018-04-13 DIAGNOSIS — C180 Malignant neoplasm of cecum: Secondary | ICD-10-CM

## 2018-04-13 DIAGNOSIS — Z5111 Encounter for antineoplastic chemotherapy: Secondary | ICD-10-CM | POA: Diagnosis not present

## 2018-04-13 LAB — CMP (CANCER CENTER ONLY)
ALBUMIN: 4 g/dL (ref 3.5–5.0)
ALT: 29 U/L (ref 0–44)
AST: 27 U/L (ref 15–41)
Alkaline Phosphatase: 95 U/L (ref 38–126)
Anion gap: 9 (ref 5–15)
BILIRUBIN TOTAL: 1.2 mg/dL (ref 0.3–1.2)
BUN: 17 mg/dL (ref 8–23)
CO2: 28 mmol/L (ref 22–32)
Calcium: 9.2 mg/dL (ref 8.9–10.3)
Chloride: 105 mmol/L (ref 98–111)
Creatinine: 1 mg/dL (ref 0.61–1.24)
GFR, Est AFR Am: >60 mL/min (ref >60)
GLUCOSE: 271 mg/dL — AB (ref 70–99)
Potassium: 4.2 mmol/L (ref 3.5–5.1)
Sodium: 142 mmol/L (ref 135–145)
TOTAL PROTEIN: 7 g/dL (ref 6.5–8.1)

## 2018-04-13 LAB — CBC WITH DIFFERENTIAL (CANCER CENTER ONLY)
BASOS PCT: 3 %
Basophils Absolute: 0.2 10*3/uL — ABNORMAL HIGH (ref 0.0–0.1)
Eosinophils Absolute: 0.2 10*3/uL (ref 0.0–0.5)
Eosinophils Relative: 3 %
HEMATOCRIT: 39.4 % (ref 38.4–49.9)
Hemoglobin: 12.1 g/dL — ABNORMAL LOW (ref 13.0–17.1)
LYMPHS PCT: 36 %
Lymphs Abs: 2 10*3/uL (ref 0.9–3.3)
MCH: 23.7 pg — ABNORMAL LOW (ref 27.2–33.4)
MCHC: 30.7 g/dL — ABNORMAL LOW (ref 32.0–36.0)
MCV: 77.3 fL — AB (ref 79.3–98.0)
MONOS PCT: 14 %
Monocytes Absolute: 0.8 10*3/uL (ref 0.1–0.9)
NEUTROS ABS: 2.4 10*3/uL (ref 1.5–6.5)
NEUTROS PCT: 44 %
Platelet Count: 279 10*3/uL (ref 140–400)
RBC: 5.1 MIL/uL (ref 4.20–5.82)
RDW: 24 % — ABNORMAL HIGH (ref 11.0–14.6)
WBC: 5.6 10*3/uL (ref 4.0–10.3)

## 2018-04-13 MED ORDER — DEXTROSE 5 % IV SOLN
INTRAVENOUS | Status: DC
  Administered 2018-04-13: 11:00:00 via INTRAVENOUS
  Filled 2018-04-13: qty 250

## 2018-04-13 MED ORDER — DEXAMETHASONE SODIUM PHOSPHATE 10 MG/ML IJ SOLN
INTRAMUSCULAR | Status: AC
  Filled 2018-04-13: qty 1

## 2018-04-13 MED ORDER — PALONOSETRON HCL INJECTION 0.25 MG/5ML
0.2500 mg | Freq: Once | INTRAVENOUS | Status: AC
  Administered 2018-04-13: 0.25 mg via INTRAVENOUS

## 2018-04-13 MED ORDER — HEPARIN SOD (PORK) LOCK FLUSH 100 UNIT/ML IV SOLN
500.0000 [IU] | Freq: Once | INTRAVENOUS | Status: DC | PRN
  Filled 2018-04-13: qty 5

## 2018-04-13 MED ORDER — LEUCOVORIN CALCIUM INJECTION 350 MG
400.0000 mg/m2 | Freq: Once | INTRAVENOUS | Status: AC
  Administered 2018-04-13: 768 mg via INTRAVENOUS
  Filled 2018-04-13: qty 38.4

## 2018-04-13 MED ORDER — SODIUM CHLORIDE 0.9% FLUSH
10.0000 mL | Freq: Once | INTRAVENOUS | Status: AC | PRN
  Administered 2018-04-13: 10 mL
  Filled 2018-04-13: qty 10

## 2018-04-13 MED ORDER — SODIUM CHLORIDE 0.9 % IV SOLN
Freq: Once | INTRAVENOUS | Status: AC
  Administered 2018-04-13: 10:00:00 via INTRAVENOUS
  Filled 2018-04-13: qty 250

## 2018-04-13 MED ORDER — SODIUM CHLORIDE 0.9 % IV SOLN
510.0000 mg | Freq: Once | INTRAVENOUS | Status: AC
  Administered 2018-04-13: 510 mg via INTRAVENOUS
  Filled 2018-04-13: qty 17

## 2018-04-13 MED ORDER — OXALIPLATIN CHEMO INJECTION 100 MG/20ML
85.0000 mg/m2 | Freq: Once | INTRAVENOUS | Status: AC
  Administered 2018-04-13: 165 mg via INTRAVENOUS
  Filled 2018-04-13: qty 33

## 2018-04-13 MED ORDER — SODIUM CHLORIDE 0.9 % IV SOLN
2400.0000 mg/m2 | INTRAVENOUS | Status: DC
  Administered 2018-04-13: 4600 mg via INTRAVENOUS
  Filled 2018-04-13: qty 92

## 2018-04-13 MED ORDER — DEXAMETHASONE SODIUM PHOSPHATE 10 MG/ML IJ SOLN
10.0000 mg | Freq: Once | INTRAMUSCULAR | Status: AC
  Administered 2018-04-13: 10 mg via INTRAVENOUS

## 2018-04-13 MED ORDER — ALTEPLASE 2 MG IJ SOLR
2.0000 mg | Freq: Once | INTRAMUSCULAR | Status: AC | PRN
  Administered 2018-04-13: 2 mg
  Filled 2018-04-13: qty 2

## 2018-04-13 MED ORDER — DEXTROSE 5 % IV SOLN
Freq: Once | INTRAVENOUS | Status: AC
  Administered 2018-04-13: 11:00:00 via INTRAVENOUS
  Filled 2018-04-13: qty 250

## 2018-04-13 MED ORDER — PALONOSETRON HCL INJECTION 0.25 MG/5ML
INTRAVENOUS | Status: AC
  Filled 2018-04-13: qty 5

## 2018-04-13 MED ORDER — FLUOROURACIL CHEMO INJECTION 2.5 GM/50ML
400.0000 mg/m2 | Freq: Once | INTRAVENOUS | Status: AC
  Administered 2018-04-13: 750 mg via INTRAVENOUS
  Filled 2018-04-13: qty 15

## 2018-04-13 MED ORDER — SODIUM CHLORIDE 0.9% FLUSH
10.0000 mL | INTRAVENOUS | Status: DC | PRN
  Filled 2018-04-13: qty 10

## 2018-04-13 NOTE — Patient Instructions (Signed)
[place] Discharge Instructions for Patients Receiving Chemotherapy  Today you received the following chemotherapy agents oxaliplatin, leucovorin, and 58fu  To help prevent nausea and vomiting after your treatment, we encourage you to take your nausea medication as directed   If you develop nausea and vomiting that is not controlled by your nausea medication, call the clinic.   BELOW ARE SYMPTOMS THAT SHOULD BE REPORTED IMMEDIATELY:  *FEVER GREATER THAN 100.5 F  *CHILLS WITH OR WITHOUT FEVER  NAUSEA AND VOMITING THAT IS NOT CONTROLLED WITH YOUR NAUSEA MEDICATION  *UNUSUAL SHORTNESS OF BREATH  *UNUSUAL BRUISING OR BLEEDING  TENDERNESS IN MOUTH AND THROAT WITH OR WITHOUT PRESENCE OF ULCERS  *URINARY PROBLEMS  *BOWEL PROBLEMS  UNUSUAL RASH Items with * indicate a potential emergency and should be followed up as soon as possible.  Feel free to call the clinic should you have any questions or concerns. The clinic phone number is [phone].  Please show the [place] at check-in to the Emergency Department and triage nurse.

## 2018-04-15 ENCOUNTER — Encounter: Payer: Self-pay | Admitting: Family Medicine

## 2018-04-15 ENCOUNTER — Ambulatory Visit (INDEPENDENT_AMBULATORY_CARE_PROVIDER_SITE_OTHER): Payer: Medicare Other | Admitting: Family Medicine

## 2018-04-15 ENCOUNTER — Inpatient Hospital Stay: Payer: Medicare Other

## 2018-04-15 ENCOUNTER — Other Ambulatory Visit: Payer: Self-pay

## 2018-04-15 VITALS — BP 130/62 | HR 81 | Temp 98.5°F | Resp 18

## 2018-04-15 VITALS — BP 128/62 | HR 75 | Temp 97.9°F | Ht 66.0 in | Wt 172.4 lb

## 2018-04-15 DIAGNOSIS — G4700 Insomnia, unspecified: Secondary | ICD-10-CM | POA: Diagnosis not present

## 2018-04-15 DIAGNOSIS — C180 Malignant neoplasm of cecum: Secondary | ICD-10-CM

## 2018-04-15 DIAGNOSIS — E1165 Type 2 diabetes mellitus with hyperglycemia: Secondary | ICD-10-CM | POA: Diagnosis not present

## 2018-04-15 DIAGNOSIS — Z5111 Encounter for antineoplastic chemotherapy: Secondary | ICD-10-CM | POA: Diagnosis not present

## 2018-04-15 MED ORDER — SODIUM CHLORIDE 0.9% FLUSH
10.0000 mL | INTRAVENOUS | Status: DC | PRN
  Administered 2018-04-15: 10 mL
  Filled 2018-04-15: qty 10

## 2018-04-15 MED ORDER — PEGFILGRASTIM-CBQV 6 MG/0.6ML ~~LOC~~ SOSY
6.0000 mg | PREFILLED_SYRINGE | Freq: Once | SUBCUTANEOUS | Status: AC
  Administered 2018-04-15: 6 mg via SUBCUTANEOUS

## 2018-04-15 MED ORDER — GLIPIZIDE 10 MG PO TABS: 10.0000 mg | ORAL_TABLET | Freq: Two times a day (BID) | ORAL | 1 refills | Status: DC

## 2018-04-15 MED ORDER — HEPARIN SOD (PORK) LOCK FLUSH 100 UNIT/ML IV SOLN
500.0000 [IU] | Freq: Once | INTRAVENOUS | Status: AC | PRN
  Administered 2018-04-15: 500 [IU]
  Filled 2018-04-15: qty 5

## 2018-04-15 NOTE — Patient Instructions (Addendum)
   As blood sugars are still running a little bit too high, I increased her glipizide to 10 mg twice per day with meals.  I did change that medication to a 10 mg pill, so you will only take 1 pill with breakfast and 1 pill with dinner.  Keep a record of your blood sugars and if you are still having readings that are staying in the 200s in the next 2 weeks, let me know and we can make other changes.  Otherwise I will see you in 6 weeks.  Please let me know if there is anything I can do in the meantime.  Continue the hydroxyzine as needed for sleep, but if you are having more difficulty with sleep I would recommend meeting with counselor or therapist through cancer center or I am happy to provide names for you as well.  Thank you for coming in today.  If you have lab work done today you will be contacted with your lab results within the next 2 weeks.  If you have not heard from [place] then please contact us. The fastest way to get your results is to register for My Chart.   IF you received an x-ray today, you will receive an invoice from [facility]. Please contact [facility] at [phone] with questions or concerns regarding your invoice.   IF you received labwork today, you will receive an invoice from [facility]. Please contact [facility] at [phone] with questions or concerns regarding your invoice.   Our billing staff will not be able to assist you with questions regarding bills from these companies.  You will be contacted with the lab results as soon as they are available. The fastest way to get your results is to activate your My Chart account. Instructions are located on the last page of this paperwork. If you have not heard from [place] regarding the results in 2 weeks, please contact this office.

## 2018-04-15 NOTE — Progress Notes (Signed)
Subjective:  By signing my name below, I, [name], attest that this documentation has been prepared under the direction and in the presence of [name], MD Electronically Signed: [name], ED Scribe 04/15/2018 at 11:00 AM.   Patient ID: William Oneill, male    DOB: 25-Jun-1946, 72 y.o.   MRN: [id number]  Chief Complaint  Patient presents with  . Diabetes    f/u   HPI William Oneill is a 72 y.o. male who presents to Primary Care at [facility] for f/u on DM. Last seen 7/29, had been diagnosed with cecal CA. Followed by [facility] oncology, last seen 9/3. S/p R proximal colectomy 02/02/18. On chemo Folfox every 3-6 moths. Other chemo started 8/4. Chemo was held at that time due to [facility] of 0.5. Neutropenia precautions. Added Glucerna, udenyca. - Pt denies depression or feeling down.  DM Lab Results  Component Value Date   HGBA1C 8.6 (H) 01/26/2018  Glipizide 10 mg at breakfast, 5 mg dinner, metformin 100 mg bid, januvia 100 mg qd. Normal renal function 9/10. Glucose elevated on recent labs in mid 200s. Some thought of increase readings with premedication dexamethazone for chemo. Increased glipizide to 10 mg at breakfast at last OV. - Pt also reports home readings in 200s since being on chemo. Lowest glucose 142, none above 300. Denies leg swelling. Lab Results  Component Value Date   CREATININE 1.00 04/13/2018   Insomnia Hydroxyzine filled last OV. - Pt restarted hydroxyzine which he states helps sometimes. He takes 1/2 tab some nights but reports some drowsiness throughout the day with this as well. He is not taking hydroxyzine nightly.  Patient Active Problem List   Diagnosis Date Noted  . 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 Generalized weakness 04/11/2015  . Acute purulent bronchitis 04/11/2015  . Dehydration 04/11/2015  . Nocturia more than twice per night 01/16/2015  . Snoring 01/16/2015  . 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  01/16/2015  . Obese abdomen 01/16/2015  . Other fatigue 01/16/2015  . Type 2 diabetes mellitus without complication ([place]) 37/16/9678  . Insomnia    Past Medical History:  Diagnosis Date  . Anemia   . Cancer ([place])   . Colon cancer ([place])   . Diabetes mellitus without complication ([place])    type 2  . High cholesterol   . History of kidney stones   . Insomnia   . Pneumonia    x1   Past Surgical History:  Procedure Laterality Date  . COLON SURGERY     laparoscopic ascending colectomy  Dr. Barry Dienes 02-02-18  . COLONOSCOPY    . LAPAROSCOPIC PARTIAL COLECTOMY N/A 02/02/2018   Procedure: LAPAROSCOPIC ASCENDING COLECTOMY;  Surgeon: Stark Klein, MD;  Location: [facility];  Service: General;  Laterality: N/A;  . NO PAST SURGERIES    . PORTACATH PLACEMENT Left 03/03/2018   Procedure: INSERTION PORT-A-CATH;  Surgeon: Stark Klein, MD;  Location: [place];  Service: General;  Laterality: Left;  . TONSILLECTOMY     as a child   No Known Allergies Prior to Admission medications   Medication Sig Start Date End Date Taking? Authorizing Provider  acetaminophen (TYLENOL) 325 MG tablet Take 2 tablets (650 mg total) by mouth every 6 (six) hours as needed. 02/06/18   [name], MD  aspirin EC 81 MG tablet Take 81 mg by mouth daily.    [provider]  atorvastatin (LIPITOR) 10 MG tablet Take 1 tablet (  10 mg total) by mouth daily. 11/12/17   [name], MD  blood glucose meter kit and supplies Dispense based on patient and insurance preference. Use up to four times daily as directed. (FOR ICD-9 250.00, 250.01). 12/11/14   [name], MD  ferrous sulfate 325 (65 FE) MG EC tablet Take 1 tablet (325 mg total) by mouth 3 (three) times daily with meals. 02/19/18   [name], MD  glipiZIDE (GLUCOTROL) 5 MG tablet 74m by mouth with breakfast, 561mby mouth with dinner. 03/01/18   GrWendie AgresteMD  glucose blood (ONE TOUCH ULTRA TEST) test strip USE AS DIRECTED TO TEST UP  TO 4 TIMES A DAY 12/07/17   GrWendie AgresteMD  hydrOXYzine (ATARAX/VISTARIL) 25 MG tablet Take 0.5-1 tablets (12.5-25 mg total) by mouth at bedtime as needed (sleep). 03/01/18   GrWendie AgresteMD  ibuprofen (ADVIL,MOTRIN) 600 MG tablet Take 1 tablet (600 mg total) by mouth every 6 (six) hours as needed (pain not controlled with tylenol). 02/06/18   CoClovis RileyMD  lidocaine-prilocaine (EMLA) cream Apply to affected area once 02/20/18   FeTruitt MerleMD  metFORMIN (GLUCOPHAGE) 1000 MG tablet Take 1 tablet (1,000 mg total) by mouth 2 (two) times daily with a meal. 03/01/18   GrWendie AgresteMD  metroNIDAZOLE (FLAGYL) 500 MG tablet  01/25/18   [provider]  Multiple Vitamin (MULTIVITAMIN WITH MINERALS) TABS tablet Take 1 tablet by mouth daily.    [provider]  neomycin (MYCIFRADIN) 500 MG tablet  01/25/18   [provider]  ondansetron (ZOFRAN) 8 MG tablet Take 1 tablet (8 mg total) by mouth 2 (two) times daily as needed for refractory nausea / vomiting. Start on day 3 after chemotherapy. 02/20/18   FeTruitt MerleMD  oxyCODONE (OXY IR/ROXICODONE) 5 MG immediate release tablet Take 1 tablet (5 mg total) by mouth every 4 (four) hours as needed for moderate pain or severe pain. 02/06/18   CoClovis RileyMD  prochlorperazine (COMPAZINE) 10 MG tablet Take 1 tablet (10 mg total) by mouth every 6 (six) hours as needed (Nausea or vomiting). 02/20/18   FeTruitt MerleMD  sitaGLIPtin (JANUVIA) 100 MG tablet Take 1 tablet (100 mg total) by mouth daily. 03/01/18   GrWendie AgresteMD   Social History   Socioeconomic History  . Marital status: Single    Spouse name: Not on file  . Number of children: Not on file  . Years of education: Not on file  . Highest education level: Not on file  Occupational History  . Not on file  Social Needs  . Financial resource strain: Not on file  . Food insecurity:    Worry: Not on file    Inability: Not on file  . Transportation needs:     Medical: Not on file    Non-medical: Not on file  Tobacco Use  . Smoking status: Never Smoker  . Smokeless tobacco: Never Used  Substance and Sexual Activity  . Alcohol use: No    Alcohol/week: 0.0 standard drinks  . Drug use: No  . Sexual activity: Yes  Lifestyle  . Physical activity:    Days per week: Not on file    Minutes per session: Not on file  . Stress: Not on file  Relationships  . Social connections:    Talks on phone: Not on file    Gets together: Not on file    Attends religious service: Not  on file    Active member of club or organization: Not on file    Attends meetings of clubs or organizations: Not on file    Relationship status: Not on file  . Intimate partner violence:    Fear of current or ex partner: Not on file    Emotionally abused: Not on file    Physically abused: Not on file    Forced sexual activity: Not on file  Other Topics Concern  . Not on file  Social History Narrative  . Not on file   Review of Systems  Constitutional: Negative for fatigue and unexpected weight change.  Eyes: Negative for visual disturbance.  Respiratory: Negative for cough, chest tightness and shortness of breath.   Cardiovascular: Negative for chest pain, palpitations and leg swelling.  Gastrointestinal: Negative for abdominal pain and blood in stool.  Neurological: Negative for dizziness, light-headedness and headaches.  Psychiatric/Behavioral: Positive for sleep disturbance. Negative for dysphoric mood.      Objective:   Physical Exam  Constitutional: He is oriented to person, place, and time. He appears well-developed and well-nourished.  HENT:  Head: Normocephalic and atraumatic.  Eyes: Pupils are equal, round, and reactive to light. EOM are normal.  Neck: No JVD present. Carotid bruit is not present.  Cardiovascular: Normal rate, regular rhythm and normal heart sounds.  No murmur heard. Pulmonary/Chest: Effort normal and breath sounds normal. He has no rales.    Musculoskeletal: He exhibits no edema.  Neurological: He is alert and oriented to person, place, and time.  Skin: Skin is warm and dry.  Psychiatric: He has a normal mood and affect.  Vitals reviewed.  Vitals:   04/15/18 1019 04/15/18 1021  BP: (!) 153/74 128/62  Pulse: 75   Temp: 97.9 F (36.6 C)   TempSrc: Oral   SpO2: 97%   Weight: 172 lb 6.4 oz (78.2 kg)   Height: _0  (1.676 m)       Assessment & Plan:   William Oneill is a 71 y.o. male Cecal cancer ([place])  -Currently undergoing treatment with chemotherapy, status post surgery.  Continue follow-up with oncology.  Recent lab work reviewed.  Type 2 diabetes mellitus with hyperglycemia, without long-term current use of insulin (HCC) - Plan: glipiZIDE (GLUCOTROL) 10 MG tablet  -Still some persistent elevated readings, will increase glipizide to 10 mg twice daily and can titrate further if persistent elevations in the next few weeks.  Continue Januvia and metformin same dose for now.  Insomnia  -Continue hydroxyzine, but if persistent symptoms would consider meeting with therapist given what he is currently gone through with cancer treatment.  I am happy to provide options but may be able to meet with therapist through cancer center as well.   Meds ordered this encounter  Medications  . glipiZIDE (GLUCOTROL) 10 MG tablet    Sig: Take 1 tablet (10 mg total) by mouth 2 (two) times daily before a meal.    Dispense:  180 tablet    Refill:  1   Patient Instructions     As blood sugars are still running a little bit too high, I increased her glipizide to 10 mg twice per day with meals.  I did change that medication to a 10 mg pill, so you will only take 1 pill with breakfast and 1 pill with dinner.  Keep a record of your blood sugars and if you are still having readings that are staying in the 200s in the next 2 weeks,  let me know and we can make other changes.  Otherwise I will see you in 6 weeks.  Please let me know if there is  anything I can do in the meantime.  Continue the hydroxyzine as needed for sleep, but if you are having more difficulty with sleep I would recommend meeting with counselor or therapist through cancer center or I am happy to provide names for you as well.  Thank you for coming in today.  If you have lab work done today you will be contacted with your lab results within the next 2 weeks.  If you have not heard from [place] then please contact us. The fastest way to get your results is to register for My Chart.   IF you received an x-ray today, you will receive an invoice from [facility]. Please contact [facility] at [phone] with questions or concerns regarding your invoice.   IF you received labwork today, you will receive an invoice from [facility]. Please contact [facility] at [phone] with questions or concerns regarding your invoice.   Our billing staff will not be able to assist you with questions regarding bills from these companies.  You will be contacted with the lab results as soon as they are available. The fastest way to get your results is to activate your My Chart account. Instructions are located on the last page of this paperwork. If you have not heard from [place] regarding the results in 2 weeks, please contact this office.       I personally performed the services described in this documentation, which was scribed in my presence. The recorded information has been reviewed and considered for accuracy and completeness, addended by me as needed, and agree with information above.  Signed,   [name], MD Primary Care at [place].  04/15/18 10:03 PM

## 2018-04-17 ENCOUNTER — Other Ambulatory Visit: Payer: Self-pay | Admitting: Hematology

## 2018-04-19 ENCOUNTER — Other Ambulatory Visit: Payer: Medicare Other

## 2018-04-19 ENCOUNTER — Ambulatory Visit: Payer: Medicare Other | Admitting: Nurse Practitioner

## 2018-04-19 ENCOUNTER — Ambulatory Visit: Payer: Medicare Other

## 2018-04-25 NOTE — Progress Notes (Signed)
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 Benson Norway 12/17/17  IMPRESSION -An infiltrative sessile and ulcerated non obstructing large mass in the cecum. This was noted to be in the cecal cap and was partially circumferential and 2cm in length. It was not noted to be bleeding.  -There was a 2 sessile polyps that were very close that were 4 to 79m that were not biopsies.  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  -The cecal mass was a poorly differentiated invasive adenocarcinoma.  [facility] [place]  Accession number is [id number]    12/17/2017 Imaging    CT CAP W Contrast 12/17/17  IMPRESSION: 1. Focal area of abnormal enhancement involving the ascending colon may represent mass discovered on recent colonoscopy. 2. No specific findings identified to suggest metastatic disease. 3. 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 Nonspecific. 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 4. Nonspecific 3 mm  right upper lobe lung nodule is noted. No follow-up needed if patient is low-risk. Non-contrast chest CT can be considered in 12 months if patient is high-risk. This recommendation follows the consensus statement: Guidelines for Management of Incidental Pulmonary Nodules Detected on CT Images: From the Fleischner Society 2017; Radiology 2017; 284:228-243. 5. 7 mm low-density structure in segment 8 of the liver is too small to characterize. 6.  Aortic Atherosclerosis (ICD10-I70.0). 7. Gallstones.    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 BBarry Dieneson 02/02/18      02/02/2018 Pathology Results    Diagnosis 02/02/18  Colon, segmental resection for tumor, right - INVASIVE COLORECTAL ADENOCARCINOMA, TWO TUMORS, 5.5 AND 1.8 CM. - LARGER, 5.5 CM TUMOR INVOLVES VISCERAL PERITONEUM - SMALLER, 1.8 CM TUMOR INVOLVES SUBMUCOSA. - ONE EXTRAMURAL SATELLITE TUMOR NODULE. - METASTATIC CARCINOMA IN ONE OF TWENTY-NINE LYMPH NODES (1/29). - TWO TUBULAR ADENOMAS, 0.6 AND 0.8 CM. - BENIGN APPENDIX WITH FIBROSIS OF THE LUMEN.     Chemotherapy    Adjuvnat chemo FOLFOX every 2 weeks for 3-6 months     02/02/2018 Cancer Staging    Staging form: Colon and Rectum, AJCC 8th Edition - Pathologic stage from 02/02/2018: Stage IIIB (pT4a, pN1a, cM0) - Signed by [name] MD on 02/20/2018    03/07/2018 -  Chemotherapy    The patient had palonosetron (ALOXI) injection 0.25 mg, 0.25 mg, Intravenous,  Once, 4 of 4 cycles Administration: 0.25 mg (03/08/2018), 0.25 mg (03/22/2018), 0.25 mg (04/13/2018), 0.25 mg (04/26/2018) pegfilgrastim-cbqv (UDENYCA) injection 6 mg, 6 mg, Subcutaneous, Once,  2 of 2 cycles Administration: 6 mg (04/15/2018) leucovorin 768 mg in dextrose 5 % 250 mL infusion, 400 mg/m2 = 768 mg, Intravenous,  Once, 4 of 4 cycles Administration: 768 mg (03/08/2018), 768 mg (03/22/2018), 768 mg (04/13/2018), 768 mg  (04/26/2018) oxaliplatin (ELOXATIN) 165 mg in dextrose 5 % 500 mL chemo infusion, 85 mg/m2 = 165 mg, Intravenous,  Once, 4 of 4 cycles Administration: 165 mg (03/08/2018), 165 mg (03/22/2018), 165 mg (04/13/2018), 165 mg (04/26/2018) fluorouracil (ADRUCIL) chemo injection 750 mg, 400 mg/m2 = 750 mg, Intravenous,  Once, 4 of 4 cycles Administration: 750 mg (03/08/2018), 750 mg (03/22/2018), 750 mg (04/13/2018), 750 mg (04/26/2018) fluorouracil (ADRUCIL) 4,600 mg in sodium chloride 0.9 % 58 mL chemo infusion, 2,400 mg/m2 = 4,600 mg, Intravenous, 1 Day/Dose, 4 of 4 cycles Administration: 4,600 mg (03/08/2018), 4,600 mg (03/22/2018), 4,600 mg (04/13/2018), 4,600 mg (04/26/2018)  for chemotherapy treatment.    CURRENT THERAPY Adjuvant FOLFOX q2 weeks, plan for 3-6 months;began8/5  INTERVAL HISTORY: William Oneill returns for follow up and cycle 4 FOLFOX as scheduled. He was last seen 9/3. Cycle 3 was delayed due to neutropenia and Udenyca was added. He had more trouble with last cycle, with increased nausea on days 2 through 5. He had emesis on one of those days. He used zofran only and not very frequently. He did not use compazine. He remained able to eat and drink. Supplements diet with glucerna. BMs are normal. Has chronic dry cough. Denies recent fever, chills, chest pain, dyspnea, leg edema, bleeding, mucositis, or neuropathy.    MEDICAL HISTORY:  Past Medical History:  Diagnosis Date  . Anemia   . Cancer ([place])   . Colon cancer ([place])   . Diabetes mellitus without complication ([place])    type 2  . High cholesterol   . History of kidney stones   . Insomnia   . Pneumonia    x1    SURGICAL HISTORY: Past Surgical History:  Procedure Laterality Date  . COLON SURGERY     laparoscopic ascending colectomy  Dr. Barry Dienes 02-02-18  . COLONOSCOPY    . LAPAROSCOPIC PARTIAL COLECTOMY N/A 02/02/2018   Procedure: LAPAROSCOPIC ASCENDING COLECTOMY;  Surgeon: Stark Klein, MD;  [facility];  Service: General;   Laterality: N/A;  . NO PAST SURGERIES    . PORTACATH PLACEMENT Left 03/03/2018   Procedure: INSERTION PORT-A-CATH;  Surgeon: Stark Klein, MD;  Location: [place];  Service: General;  Laterality: Left;  . TONSILLECTOMY     as a child    I have reviewed the social history and family history with the patient and they are unchanged from previous note.  ALLERGIES:  has No Known Allergies.  MEDICATIONS:  Current Outpatient Medications  Medication Sig Dispense Refill  . aspirin EC 81 MG tablet Take 81 mg by mouth daily.    Marland Kitchen atorvastatin (LIPITOR) 10 MG tablet Take 1 tablet (10 mg total) by mouth daily. 90 tablet 1  . blood glucose meter kit and supplies Dispense based on patient and insurance preference. Use up to four times daily as directed. (FOR ICD-9 250.00, 250.01). 1 each 0  . ferrous sulfate 325 (65 FE) MG EC tablet Take 1 tablet (325 mg total) by mouth 3 (three) times daily with meals. 30 tablet 2  . glipiZIDE (GLUCOTROL) 10 MG tablet Take 1 tablet (10 mg total) by mouth 2 (two) times daily before a meal. 180 tablet 1  . glucose blood (ONE TOUCH ULTRA TEST) test strip USE  AS DIRECTED TO TEST UP TO 4 TIMES A DAY 100 each 6  . hydrOXYzine (ATARAX/VISTARIL) 25 MG tablet Take 0.5-1 tablets (12.5-25 mg total) by mouth at bedtime as needed (sleep). 30 tablet 0  . ibuprofen (ADVIL,MOTRIN) 600 MG tablet Take 1 tablet (600 mg total) by mouth every 6 (six) hours as needed (pain not controlled with tylenol). 30 tablet 0  . lidocaine-prilocaine (EMLA) cream Apply to affected area once 30 g 3  . metFORMIN (GLUCOPHAGE) 1000 MG tablet Take 1 tablet (1,000 mg total) by mouth 2 (two) times daily with a meal. 180 tablet 1  . metroNIDAZOLE (FLAGYL) 500 MG tablet     . Multiple Vitamin (MULTIVITAMIN WITH MINERALS) TABS tablet Take 1 tablet by mouth daily.    Marland Kitchen neomycin (MYCIFRADIN) 500 MG tablet     . ondansetron (ZOFRAN) 8 MG tablet Take 1 tablet (8 mg total) by mouth 2 (two) times daily  as needed for refractory nausea / vomiting. Start on day 3 after chemotherapy. 30 tablet 1  . prochlorperazine (COMPAZINE) 10 MG tablet Take 1 tablet (10 mg total) by mouth every 6 (six) hours as needed (Nausea or vomiting). 30 tablet 1  . sitaGLIPtin (JANUVIA) 100 MG tablet Take 1 tablet (100 mg total) by mouth daily. 90 tablet 1  . acetaminophen (TYLENOL) 325 MG tablet Take 2 tablets (650 mg total) by mouth every 6 (six) hours as needed. (Patient not taking: Reported on 04/15/2018)     No current facility-administered medications for this visit.    Facility-Administered Medications Ordered in Other Visits  Medication Dose Route Frequency Provider Last Rate Last Dose  . dextrose 5 % solution   Intravenous Continuous [name], MD   Stopped at 04/26/18 1401  . fluorouracil (ADRUCIL) 4,600 mg in sodium chloride 0.9 % 58 mL chemo infusion  2,400 mg/m2 (Treatment Plan Recorded) Intravenous 1 day or 1 dose [name], MD   4,600 mg at 04/26/18 1410    PHYSICAL EXAMINATION: ECOG PERFORMANCE STATUS: 1 - Symptomatic but completely ambulatory  Vitals:   04/26/18 0943  BP: (!) 162/80  Pulse: 65  Resp: 17  Temp: 98.3 F (36.8 C)  SpO2: 100%   Filed Weights   04/26/18 0943  Weight: 172 lb 11.2 oz (78.3 kg)    GENERAL:alert, no distress and comfortable SKIN: skin color, texture, turgor are normal, no rashes or significant lesions EYES: sclera clear OROPHARYNX:no thrush or ulcers LYMPH:  no palpable cervical or supraclavicular lymphadenopathy LUNGS: clear to auscultation with normal breathing effort HEART: regular rate & rhythm; no lower extremity edema ABDOMEN:abdomen soft, non-tender and normal bowel sounds Musculoskeletal: no cyanosis of digits and no clubbing  NEURO: alert & oriented x 3 with fluent speech, no focal motor/sensory deficits PAC without erythema   LABORATORY DATA:  I have reviewed the data as listed CBC Latest Ref Rng & Units 04/26/2018 04/13/2018 04/06/2018  WBC 4.0 -  10.3 K/uL 8.5 5.6 1.8(L)  Hemoglobin 13.0 - 17.1 g/dL 12.6(L) 12.1(L) 10.7(L)  Hematocrit 38.4 - 49.9 % 38.9 39.4 34.0(L)  Platelets 140 - 400 K/uL 116(L) 279 153     CMP Latest Ref Rng & Units 04/26/2018 04/13/2018 04/06/2018  Glucose 70 - 99 mg/dL 268(H) 271(H) 240(H)  BUN 8 - 23 mg/dL _0 Creatinine 0.61 - 1.24 mg/dL 1.03 1.00 0.89  Sodium 135 - 145 mmol/L 143 142 142  Potassium 3.5 - 5.1 mmol/L 4.4 4.2 4.0  Chloride 98 - 111 mmol/L 106 105 109  CO2 22 - 32 mmol/L _0 Calcium 8.9 - 10.3 mg/dL 9.4 9.2 9.0  Total Protein 6.5 - 8.1 g/dL 6.4(L) 7.0 6.2(L)  Total Bilirubin 0.3 - 1.2 mg/dL 1.0 1.2 1.8(H)  Alkaline Phos 38 - 126 U/L 155(H) 95 78  AST 15 - 41 U/L 38 27 25  ALT 0 - 44 U/L _1 RADIOGRAPHIC STUDIES: I have personally reviewed the radiological images as listed and agreed with the findings in the report. No results found.   ASSESSMENT & PLAN: William Oneill a 72 y.o.Caucasianmalewith a history of DM.  1. Cecal Cancer,adenocarcinoma, grade 2, pT4aN1aM0,stage IIIB, MSS 2. Anemia of iron deficiency  3. DM - on metformin, glipizide, and januvia; PCP recently increased glipizide to 10 mg BID 4. 7 mm liver lesion, seen on staging work up; plan to repeat CT upon completion of chemotherapy to follow the lesion 5. Chemotherapy-induced neutropenia, added Udenyca with cycle 3  William Oneill appears stable. He completed 3 cycles adjuvant FOLFOX, with addition of Udenyca from cycle 3. He tolerated last cycle moderately well, with increased nausea and vomiting. We reviewed anti-emetics. He will add compazine and alternate with zofran. If this is not effective will likely add emend with next cycle. Labs reviewed, neutropenia resolved with udenyca. He has mild thrombocytopenia, PLT 116K but adequate to continue chemo. BG 268, PCP recently increased glipizide. We reviewed healthy diet and hydration. Patient receiving oxaliplatin, D5 cannot be changed to NS for  chemo. Will follow closely. He will return for f/u and cycle 5 in 2 weeks.  PLAN: -Labs reviewed, proceed with cycle 4 FOLFOX with udenyca on day 3 at current doses  -Add compazine, alternate with zofran for better nausea control -F/u in 2 weeks with cycle 5  All questions were answered. The patient knows to call the clinic with any problems, questions or concerns. No barriers to learning was detected.     [name], NP 04/26/18

## 2018-04-26 ENCOUNTER — Telehealth: Payer: Self-pay | Admitting: Nurse Practitioner

## 2018-04-26 ENCOUNTER — Telehealth: Payer: Self-pay | Admitting: Hematology

## 2018-04-26 ENCOUNTER — Encounter: Payer: Self-pay | Admitting: Hematology

## 2018-04-26 ENCOUNTER — Encounter: Payer: Self-pay | Admitting: Nurse Practitioner

## 2018-04-26 ENCOUNTER — Inpatient Hospital Stay: Payer: Medicare Other

## 2018-04-26 ENCOUNTER — Inpatient Hospital Stay (HOSPITAL_BASED_OUTPATIENT_CLINIC_OR_DEPARTMENT_OTHER): Payer: Medicare Other | Admitting: Nurse Practitioner

## 2018-04-26 VITALS — BP 162/80 | HR 65 | Temp 98.3°F | Resp 17 | Ht 66.0 in | Wt 172.7 lb

## 2018-04-26 DIAGNOSIS — E119 Type 2 diabetes mellitus without complications: Secondary | ICD-10-CM

## 2018-04-26 DIAGNOSIS — D701 Agranulocytosis secondary to cancer chemotherapy: Secondary | ICD-10-CM

## 2018-04-26 DIAGNOSIS — C180 Malignant neoplasm of cecum: Secondary | ICD-10-CM

## 2018-04-26 DIAGNOSIS — D509 Iron deficiency anemia, unspecified: Secondary | ICD-10-CM

## 2018-04-26 DIAGNOSIS — Z5111 Encounter for antineoplastic chemotherapy: Secondary | ICD-10-CM | POA: Diagnosis not present

## 2018-04-26 LAB — CBC WITH DIFFERENTIAL (CANCER CENTER ONLY)
BASOS ABS: 0.1 10*3/uL (ref 0.0–0.1)
Basophils Relative: 1 %
EOS ABS: 0.2 10*3/uL (ref 0.0–0.5)
Eosinophils Relative: 3 %
HEMATOCRIT: 38.9 % (ref 38.4–49.9)
Hemoglobin: 12.6 g/dL — ABNORMAL LOW (ref 13.0–17.1)
Lymphocytes Relative: 17 %
Lymphs Abs: 1.5 10*3/uL (ref 0.9–3.3)
MCH: 25.3 pg — AB (ref 27.2–33.4)
MCHC: 32.4 g/dL (ref 32.0–36.0)
MCV: 78 fL — AB (ref 79.3–98.0)
Monocytes Absolute: 0.7 10*3/uL (ref 0.1–0.9)
Monocytes Relative: 9 %
NEUTROS PCT: 70 %
Neutro Abs: 5.9 10*3/uL (ref 1.5–6.5)
Platelet Count: 116 10*3/uL — ABNORMAL LOW (ref 140–400)
RBC: 4.99 MIL/uL (ref 4.20–5.82)
RDW: 24.9 % — AB (ref 11.0–14.6)
WBC: 8.5 10*3/uL (ref 4.0–10.3)
nRBC: 1 /100 WBC — ABNORMAL HIGH

## 2018-04-26 LAB — CMP (CANCER CENTER ONLY)
ALT: 26 U/L (ref 0–44)
AST: 38 U/L (ref 15–41)
Albumin: 3.5 g/dL (ref 3.5–5.0)
Alkaline Phosphatase: 155 U/L — ABNORMAL HIGH (ref 38–126)
Anion gap: 11 (ref 5–15)
BUN: 13 mg/dL (ref 8–23)
CALCIUM: 9.4 mg/dL (ref 8.9–10.3)
CHLORIDE: 106 mmol/L (ref 98–111)
CO2: 26 mmol/L (ref 22–32)
Creatinine: 1.03 mg/dL (ref 0.61–1.24)
Glucose, Bld: 268 mg/dL — ABNORMAL HIGH (ref 70–99)
Potassium: 4.4 mmol/L (ref 3.5–5.1)
SODIUM: 143 mmol/L (ref 135–145)
Total Bilirubin: 1 mg/dL (ref 0.3–1.2)
Total Protein: 6.4 g/dL — ABNORMAL LOW (ref 6.5–8.1)

## 2018-04-26 MED ORDER — PALONOSETRON HCL INJECTION 0.25 MG/5ML
INTRAVENOUS | Status: AC
  Filled 2018-04-26: qty 5

## 2018-04-26 MED ORDER — DEXTROSE 5 % IV SOLN
Freq: Once | INTRAVENOUS | Status: AC
  Administered 2018-04-26: 11:00:00 via INTRAVENOUS
  Filled 2018-04-26: qty 250

## 2018-04-26 MED ORDER — PALONOSETRON HCL INJECTION 0.25 MG/5ML
0.2500 mg | Freq: Once | INTRAVENOUS | Status: AC
  Administered 2018-04-26: 0.25 mg via INTRAVENOUS

## 2018-04-26 MED ORDER — LEUCOVORIN CALCIUM INJECTION 350 MG
400.0000 mg/m2 | Freq: Once | INTRAVENOUS | Status: AC
  Administered 2018-04-26: 768 mg via INTRAVENOUS
  Filled 2018-04-26: qty 38.4

## 2018-04-26 MED ORDER — DEXTROSE 5 % IV SOLN
INTRAVENOUS | Status: DC
  Administered 2018-04-26: 11:00:00 via INTRAVENOUS
  Filled 2018-04-26: qty 250

## 2018-04-26 MED ORDER — DEXAMETHASONE SODIUM PHOSPHATE 10 MG/ML IJ SOLN
10.0000 mg | Freq: Once | INTRAMUSCULAR | Status: AC
  Administered 2018-04-26: 10 mg via INTRAVENOUS

## 2018-04-26 MED ORDER — SODIUM CHLORIDE 0.9 % IV SOLN
2400.0000 mg/m2 | INTRAVENOUS | Status: DC
  Administered 2018-04-26: 4600 mg via INTRAVENOUS
  Filled 2018-04-26: qty 92

## 2018-04-26 MED ORDER — FLUOROURACIL CHEMO INJECTION 2.5 GM/50ML
400.0000 mg/m2 | Freq: Once | INTRAVENOUS | Status: AC
  Administered 2018-04-26: 750 mg via INTRAVENOUS
  Filled 2018-04-26: qty 15

## 2018-04-26 MED ORDER — DEXAMETHASONE SODIUM PHOSPHATE 10 MG/ML IJ SOLN
INTRAMUSCULAR | Status: AC
  Filled 2018-04-26: qty 1

## 2018-04-26 MED ORDER — OXALIPLATIN CHEMO INJECTION 100 MG/20ML
85.0000 mg/m2 | Freq: Once | INTRAVENOUS | Status: AC
  Administered 2018-04-26: 165 mg via INTRAVENOUS
  Filled 2018-04-26: qty 33

## 2018-04-26 NOTE — Telephone Encounter (Signed)
Scheduled appt per 9/23 sch message - pt to get an updated schedule next visit.

## 2018-04-26 NOTE — Telephone Encounter (Signed)
No los 9/23 °

## 2018-04-26 NOTE — Patient Instructions (Signed)
[facility] Discharge Instructions for Patients Receiving Chemotherapy  Today you received the following chemotherapy agents Oxaliplatin, Leucovorin, 5FU  To help prevent nausea and vomiting after your treatment, we encourage you to take your nausea medication as directed   If you develop nausea and vomiting that is not controlled by your nausea medication, call the clinic.   BELOW ARE SYMPTOMS THAT SHOULD BE REPORTED IMMEDIATELY:  *FEVER GREATER THAN 100.5 F  *CHILLS WITH OR WITHOUT FEVER  NAUSEA AND VOMITING THAT IS NOT CONTROLLED WITH YOUR NAUSEA MEDICATION  *UNUSUAL SHORTNESS OF BREATH  *UNUSUAL BRUISING OR BLEEDING  TENDERNESS IN MOUTH AND THROAT WITH OR WITHOUT PRESENCE OF ULCERS  *URINARY PROBLEMS  *BOWEL PROBLEMS  UNUSUAL RASH Items with * indicate a potential emergency and should be followed up as soon as possible.  Feel free to call the clinic should you have any questions or concerns. The clinic phone number is [phone].  Please show the CHEMO ALERT CARD at check-in to the Emergency Department and triage nurse.   

## 2018-04-28 ENCOUNTER — Inpatient Hospital Stay: Payer: Medicare Other

## 2018-04-28 VITALS — BP 138/78 | HR 69 | Temp 98.7°F | Resp 18

## 2018-04-28 DIAGNOSIS — Z5111 Encounter for antineoplastic chemotherapy: Secondary | ICD-10-CM | POA: Diagnosis not present

## 2018-04-28 DIAGNOSIS — C180 Malignant neoplasm of cecum: Secondary | ICD-10-CM

## 2018-04-28 MED ORDER — HEPARIN SOD (PORK) LOCK FLUSH 100 UNIT/ML IV SOLN
500.0000 [IU] | Freq: Once | INTRAVENOUS | Status: AC | PRN
  Administered 2018-04-28: 500 [IU]
  Filled 2018-04-28: qty 5

## 2018-04-28 MED ORDER — PEGFILGRASTIM-CBQV 6 MG/0.6ML ~~LOC~~ SOSY
PREFILLED_SYRINGE | SUBCUTANEOUS | Status: AC
  Filled 2018-04-28: qty 0.6

## 2018-04-28 MED ORDER — PEGFILGRASTIM-CBQV 6 MG/0.6ML ~~LOC~~ SOSY
6.0000 mg | PREFILLED_SYRINGE | Freq: Once | SUBCUTANEOUS | Status: AC
  Administered 2018-04-28: 6 mg via SUBCUTANEOUS

## 2018-04-28 MED ORDER — SODIUM CHLORIDE 0.9% FLUSH
10.0000 mL | INTRAVENOUS | Status: DC | PRN
  Administered 2018-04-28: 10 mL
  Filled 2018-04-28: qty 10

## 2018-05-01 ENCOUNTER — Other Ambulatory Visit: Payer: Self-pay

## 2018-05-01 ENCOUNTER — Emergency Department (HOSPITAL_COMMUNITY)
Admission: EM | Admit: 2018-05-01 | Discharge: 2018-05-01 | Disposition: A | Discharge status: Home / Self Care | Payer: Medicare Other | Attending: Emergency Medicine | Admitting: Emergency Medicine

## 2018-05-01 ENCOUNTER — Emergency Department (HOSPITAL_COMMUNITY): Payer: Medicare Other

## 2018-05-01 ENCOUNTER — Encounter (HOSPITAL_COMMUNITY): Payer: Self-pay | Admitting: *Deleted

## 2018-05-01 DIAGNOSIS — C189 Malignant neoplasm of colon, unspecified: Secondary | ICD-10-CM | POA: Diagnosis not present

## 2018-05-01 DIAGNOSIS — Z79899 Other long term (current) drug therapy: Secondary | ICD-10-CM | POA: Insufficient documentation

## 2018-05-01 DIAGNOSIS — E119 Type 2 diabetes mellitus without complications: Secondary | ICD-10-CM | POA: Diagnosis not present

## 2018-05-01 DIAGNOSIS — Z7984 Long term (current) use of oral hypoglycemic drugs: Secondary | ICD-10-CM | POA: Insufficient documentation

## 2018-05-01 DIAGNOSIS — R110 Nausea: Secondary | ICD-10-CM | POA: Diagnosis not present

## 2018-05-01 DIAGNOSIS — Z978 Presence of other specified devices: Secondary | ICD-10-CM | POA: Diagnosis not present

## 2018-05-01 DIAGNOSIS — D649 Anemia, unspecified: Secondary | ICD-10-CM | POA: Diagnosis not present

## 2018-05-01 DIAGNOSIS — R531 Weakness: Secondary | ICD-10-CM | POA: Insufficient documentation

## 2018-05-01 LAB — I-STAT TROPONIN, ED: Troponin i, poc: 0 ng/mL (ref 0.00–0.08)

## 2018-05-01 LAB — CBC WITH DIFFERENTIAL/PLATELET
Basophils Absolute: 0 10*3/uL (ref 0.0–0.1)
Basophils Relative: 0 %
EOS ABS: 0.3 10*3/uL (ref 0.0–0.7)
EOS PCT: 1 %
HEMATOCRIT: 42.2 % (ref 39.0–52.0)
Hemoglobin: 13.6 g/dL (ref 13.0–17.0)
LYMPHS ABS: 1.6 10*3/uL (ref 0.7–4.0)
Lymphocytes Relative: 5 %
MCH: 25.7 pg — ABNORMAL LOW (ref 26.0–34.0)
MCHC: 32.2 g/dL (ref 30.0–36.0)
MCV: 79.6 fL (ref 78.0–100.0)
MONO ABS: 0.3 10*3/uL (ref 0.1–1.0)
Monocytes Relative: 1 %
Neutro Abs: 29.4 10*3/uL — ABNORMAL HIGH (ref 1.7–7.7)
Neutrophils Relative %: 93 %
Platelets: 157 10*3/uL (ref 150–400)
RBC: 5.3 MIL/uL (ref 4.22–5.81)
RDW: 24.7 % — AB (ref 11.5–15.5)
WBC: 31.6 10*3/uL — AB (ref 4.0–10.5)

## 2018-05-01 LAB — COMPREHENSIVE METABOLIC PANEL
ALT: 31 U/L (ref 0–44)
ANION GAP: 8 (ref 5–15)
AST: 40 U/L (ref 15–41)
Albumin: 3.7 g/dL (ref 3.5–5.0)
Alkaline Phosphatase: 206 U/L — ABNORMAL HIGH (ref 38–126)
BILIRUBIN TOTAL: 2.8 mg/dL — AB (ref 0.3–1.2)
BUN: 33 mg/dL — AB (ref 8–23)
CALCIUM: 8.8 mg/dL — AB (ref 8.9–10.3)
CO2: 28 mmol/L (ref 22–32)
CREATININE: 0.77 mg/dL (ref 0.61–1.24)
Chloride: 104 mmol/L (ref 98–111)
GFR calc Af Amer: >60 mL/min (ref >60)
GLUCOSE: 224 mg/dL — AB (ref 70–99)
POTASSIUM: 4.5 mmol/L (ref 3.5–5.1)
Sodium: 140 mmol/L (ref 135–145)
Total Protein: 6.6 g/dL (ref 6.5–8.1)

## 2018-05-01 LAB — URINALYSIS, ROUTINE W REFLEX MICROSCOPIC
BACTERIA UA: NONE SEEN
Bilirubin Urine: NEGATIVE
Hgb urine dipstick: NEGATIVE
Ketones, ur: 20 mg/dL — AB
Leukocytes, UA: NEGATIVE
Nitrite: NEGATIVE
PH: 5 (ref 5.0–8.0)
Protein, ur: 30 mg/dL — AB
Specific Gravity, Urine: 1.032 — ABNORMAL HIGH (ref 1.005–1.030)

## 2018-05-01 MED ORDER — ONDANSETRON 4 MG PO TBDP: 4.0000 mg | ORAL_TABLET | Freq: Three times a day (TID) | ORAL | 0 refills | Status: DC | PRN

## 2018-05-01 MED ORDER — SODIUM CHLORIDE 0.9 % IV BOLUS
1000.0000 mL | Freq: Once | INTRAVENOUS | Status: AC
  Administered 2018-05-01: 1000 mL via INTRAVENOUS

## 2018-05-01 NOTE — ED Notes (Signed)
Pt given 4 oz of water and graham crackers with peanut butter

## 2018-05-01 NOTE — ED Triage Notes (Signed)
Pt comes to ED with a c/o weakness, fatigue, poor appetite, since last chemo treatment on Wednesday. Pt has a colon cancer, sts this is worst he felt since on chemo.

## 2018-05-01 NOTE — ED Notes (Signed)
Pt is eating graham crackers and drinking coffee

## 2018-05-01 NOTE — ED Notes (Signed)
Urine and culture sent to lab  

## 2018-05-01 NOTE — ED Provider Notes (Signed)
[facility] DEPT Provider Note   CSN: [id number] Arrival date & time: 05/01/18  1925     History   Chief Complaint Chief Complaint  Patient presents with  . Weakness    cancer pt    HPI William Oneill is a 72 y.o. male with PMH/o Anemia, Colon Cancer (last chemo was Wednesday), who presents for evaluation of generalized weakness, decreased appetite, fatigue.  He states that he recently got chemo on 04/28/18.  He reports that he thinks he also got a Neupogen injection.  He states that normally a day after his chemo, he is usually very tired and fatigued but feels like this is worse.  He states he has had no energy and is not really eating at all.  He reports nausea but no vomiting.  He states that he has not run any fever.  His sister came over today and noted that he appeared to be weaker than normal and prompted him to come to the emergency department.  He has a port on the left upper chest and denies any surrounding warmth, erythema.  He does report he has been having some light cough.  Cough is nonproductive.  Patient denies any chest pain, difficulty breathing, abdominal pain, focal weakness of his extremities.  The history is provided by the patient.    Past Medical History:  Diagnosis Date  . Anemia   . Cancer ([place])   . Colon cancer ([place])   . Diabetes mellitus without complication ([place])    type 2  . High cholesterol   . History of kidney stones   . Insomnia   . Pneumonia    x1    Patient Active Problem List   Diagnosis Date Noted  . 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 Generalized weakness 04/11/2015  . Acute purulent bronchitis 04/11/2015  . Dehydration 04/11/2015  . Nocturia more than twice per night 01/16/2015  . Snoring 01/16/2015  . 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 Obese abdomen 01/16/2015  . Other fatigue 01/16/2015  . Type 2 diabetes mellitus without complication ([place])  44/08/270  . Insomnia     Past Surgical History:  Procedure Laterality Date  . COLON SURGERY     laparoscopic ascending colectomy  Dr. Barry Dienes 02-02-18  . COLONOSCOPY    . LAPAROSCOPIC PARTIAL COLECTOMY N/A 02/02/2018   Procedure: LAPAROSCOPIC ASCENDING COLECTOMY;  Surgeon: Stark Klein, MD;  Location: [facility];  Service: General;  Laterality: N/A;  . NO PAST SURGERIES    . PORTACATH PLACEMENT Left 03/03/2018   Procedure: INSERTION PORT-A-CATH;  Surgeon: Stark Klein, MD;  Location: [facility];  Service: General;  Laterality: Left;  . TONSILLECTOMY     as a child        Home Medications    Prior to Admission medications   Medication Sig Start Date End Date Taking? Authorizing Provider  atorvastatin (LIPITOR) 10 MG tablet Take 1 tablet (10 mg total) by mouth daily. 11/12/17  Yes [name], MD  blood glucose meter kit and supplies Dispense based on patient and insurance preference. Use up to four times daily as directed. (FOR ICD-9 250.00, 250.01). 12/11/14  Yes [name], MD  glipiZIDE (GLUCOTROL) 10 MG tablet Take 1 tablet (10 mg total) by mouth 2 (two) times daily before a meal. 04/15/18  Yes [name], MD  glucose blood (ONE TOUCH ULTRA TEST) test strip USE AS DIRECTED TO TEST UP TO  4 TIMES A DAY 12/07/17  Yes [name], MD  lidocaine-prilocaine (EMLA) cream Apply to affected area once 02/20/18  Yes [name], MD  metFORMIN (GLUCOPHAGE) 1000 MG tablet Take 1 tablet (1,000 mg total) by mouth 2 (two) times daily with a meal. 03/01/18  Yes [name], MD  Multiple Vitamin (MULTIVITAMIN WITH MINERALS) TABS tablet Take 1 tablet by mouth daily.   Yes [provider]  ondansetron (ZOFRAN) 8 MG tablet Take 1 tablet (8 mg total) by mouth 2 (two) times daily as needed for refractory nausea / vomiting. Start on day 3 after chemotherapy. 02/20/18  Yes [name], MD  prochlorperazine (COMPAZINE) 10 MG tablet Take 1 tablet (10 mg total) by mouth  every 6 (six) hours as needed (Nausea or vomiting). 02/20/18  Yes [name], MD  sitaGLIPtin (JANUVIA) 100 MG tablet Take 1 tablet (100 mg total) by mouth daily. 03/01/18  Yes [name], MD  acetaminophen (TYLENOL) 325 MG tablet Take 2 tablets (650 mg total) by mouth every 6 (six) hours as needed. Patient not taking: Reported on 04/15/2018 02/06/18   [name], MD  ferrous sulfate 325 (65 FE) MG EC tablet Take 1 tablet (325 mg total) by mouth 3 (three) times daily with meals. Patient not taking: Reported on 05/01/2018 02/19/18   [name], MD  hydrOXYzine (ATARAX/VISTARIL) 25 MG tablet Take 0.5-1 tablets (12.5-25 mg total) by mouth at bedtime as needed (sleep). Patient not taking: Reported on 05/01/2018 03/01/18   [name], MD  ibuprofen (ADVIL,MOTRIN) 600 MG tablet Take 1 tablet (600 mg total) by mouth every 6 (six) hours as needed (pain not controlled with tylenol). Patient not taking: Reported on 05/01/2018 02/06/18   [name], MD  ondansetron (ZOFRAN ODT) 4 MG disintegrating tablet Take 1 tablet (4 mg total) by mouth every 8 (eight) hours as needed for nausea or vomiting. 05/01/18   Volanda Napoleon, PA-C    Family History Family History  Problem Relation Age of Onset  . Heart disease Father   . Heart attack Father   . Heart disease Sister   . Diabetes Sister   . Stroke Mother     Social History Social History   Tobacco Use  . Smoking status: Never Smoker  . Smokeless tobacco: Never Used  Substance Use Topics  . Alcohol use: No    Alcohol/week: 0.0 standard drinks  . Drug use: No     Allergies   Patient has no known allergies.   Review of Systems Review of Systems  Constitutional: Positive for appetite change. Negative for fever.  Respiratory: Negative for cough and shortness of breath.   Cardiovascular: Negative for chest pain.  Gastrointestinal: Positive for nausea. Negative for abdominal pain and vomiting.  Genitourinary: Negative for dysuria  and hematuria.  Neurological: Positive for weakness (Generalized). Negative for headaches.  All other systems reviewed and are negative.    Physical Exam Updated Vital Signs BP 135/72   Pulse 79   Temp 98 F (36.7 C) (Oral)   Resp (!) 23   SpO2 98%   Physical Exam  Constitutional: He is oriented to person, place, and time. He appears well-developed and well-nourished.  HENT:  Head: Normocephalic and atraumatic.  Mouth/Throat: Oropharynx is clear and moist and mucous membranes are normal.  Eyes: Pupils are equal, round, and reactive to light. Conjunctivae, EOM and lids are normal.  Neck: Full passive range of motion without pain.  Cardiovascular: Normal rate, regular rhythm,  normal heart sounds and normal pulses. Exam reveals no gallop and no friction rub.  No murmur heard. Pulmonary/Chest: Effort normal and breath sounds normal.  Lungs clear to auscultation bilaterally.  Symmetric chest rise.  No wheezing, rales, rhonchi.    Abdominal: Soft. Normal appearance. There is no tenderness. There is no rigidity and no guarding.  Abdomen is soft, non-distended, non-tender. No rigidity, No guarding. No peritoneal signs.  Musculoskeletal: Normal range of motion.  Neurological: He is alert and oriented to person, place, and time.  Cranial nerves III-XII intact Follows commands, Moves all extremities  5/5 strength to BUE and BLE  Sensation intact throughout all major nerve distributions Normal finger to nose. No dysdiadochokinesia. No pronator drift. No gait abnormalities  No slurred speech. No facial droop.  Skin: Skin is warm and dry. Capillary refill takes less than 2 seconds.  Psychiatric: He has a normal mood and affect. His speech is normal.  Nursing note and vitals reviewed.    ED Treatments / Results  Labs (all labs ordered are listed, but only abnormal results are displayed) Labs Reviewed  CBC WITH DIFFERENTIAL/PLATELET - Abnormal; Notable for the following components:       Result Value   WBC 31.6 (*)    MCH 25.7 (*)    RDW 24.7 (*)    Neutro Abs 29.4 (*)    All other components within normal limits  COMPREHENSIVE METABOLIC PANEL - Abnormal; Notable for the following components:   Glucose, Bld 224 (*)    BUN 33 (*)    Calcium 8.8 (*)    Alkaline Phosphatase 206 (*)    Total Bilirubin 2.8 (*)    All other components within normal limits  URINALYSIS, ROUTINE W REFLEX MICROSCOPIC - Abnormal; Notable for the following components:   Color, Urine AMBER (*)    Specific Gravity, Urine 1.032 (*)    Glucose, UA >=500 (*)    Ketones, ur 20 (*)    Protein, ur 30 (*)    All other components within normal limits  I-STAT TROPONIN, ED    EKG EKG Interpretation  Date/Time:  Saturday May 01 2018 20:36:12 EDT Ventricular Rate:  81 PR Interval:    QRS Duration: 89 QT Interval:  396 QTC Calculation: 460 R Axis:   41 Text Interpretation:  Age not entered, assumed to be  72 years old for purpose of ECG interpretation Sinus rhythm Atrial premature complex Borderline repolarization abnormality No acute changes Confirmed by Varney Biles [phone]) on 05/01/2018 10:59:42 PM   Radiology Dg Chest 2 View  Result Date: 05/01/2018 CLINICAL DATA:  Cough EXAM: CHEST - 2 VIEW COMPARISON:  03/03/2018 FINDINGS: Left-sided central venous port tip over the SVC. No focal airspace disease or effusion. Borderline cardiomegaly. No pneumothorax. IMPRESSION: No active cardiopulmonary disease.  Borderline to mild cardiomegaly. Electronically Signed   By: Donavan Foil M.D.   On: 05/01/2018 22:22    Procedures Procedures (including critical care time)  Medications Ordered in ED Medications  sodium chloride 0.9 % bolus 1,000 mL (0 mLs Intravenous Stopped 05/01/18 2307)     Initial Impression / Assessment and Plan / ED Course  I have reviewed the triage vital signs and the nursing notes.  Pertinent labs & imaging results that were available during my care of the patient  were reviewed by me and considered in my medical decision making (see chart for details).     72 year old male past history of colon cancer, last chemo was on 04/28/18 who presents for  evaluation of generalized weakness, decreased appetite, fatigue.  Reports normally feeling this way after chemo but worse.  No fevers, vomiting, chest pain. Patient is afebril, non-toxic appearing, sitting comfortably on examination table. Vital signs reviewed and stable.  No neuro deficits noted on exam.  Consider electrolyte imbalance versus infectious etiology.  Plan to check basic labs.  Additionally, patient reports he has been having some cough.  Will check chest x-ray for evaluation of pneumonia.  Troponin negative.  CBC shows leukocytosis of 31.6.  CMP shows glucose of 224.  BUN is 33.  Creatinine is 0.77.  Normal anion gap.  UA shows small amount of ketones.  No infectious etiology.  Chest x-ray negative for any acute infectious etiology.  Discussed results of patient.  Reports improvement in symptoms after being here in the ED.  He has been able to tolerate p.o. without any difficulty.  No vomiting.  Vital signs are stable.  On my evaluation, EKG appeared to be regular.  Repeat EKG reviewed with Dr. Kathrynn Humble showed sinus rhythm with P waves.  Not concerning for A. Fib.  Patient stable for discharge at this time.  Instructed patient to follow-up with his primary care doctor. Patient had ample opportunity for questions and discussion. All patient's questions were answered with full understanding. Strict return precautions discussed. Patient expresses understanding and agreement to plan.   Final Clinical Impressions(s) / ED Diagnoses   Final diagnoses:  Generalized weakness  Nausea    ED Discharge Orders         Ordered    ondansetron (ZOFRAN ODT) 4 MG disintegrating tablet  Every 8 hours PRN,   Status:  Discontinued     05/01/18 2305    ondansetron (ZOFRAN ODT) 4 MG disintegrating tablet  Every 8 hours PRN      05/01/18 2306           Volanda Napoleon, PA-C 05/01/18 2341    Varney Biles, MD 05/02/18 1507

## 2018-05-01 NOTE — Discharge Instructions (Addendum)
Zofran as directed for nausea.  Make sure you are staying hydrated drinking plenty of fluids.  Follow-up with your primary care doctor next 2 to 4 days for further evaluation.  Return the emergency department for any fever, chest pain, difficulty breathing, vomiting, worsening weakness or any other worsening or concerning symptoms.

## 2018-05-01 NOTE — ED Notes (Signed)
Pt aware that urine sample is needed. Urinal at bedside due to weakness

## 2018-05-09 NOTE — Progress Notes (Signed)
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 Benson Norway 12/17/17  IMPRESSION -An infiltrative sessile and ulcerated non obstructing large mass in the cecum. This was noted to be in the cecal cap and was partially circumferential and 2cm in length. It was not noted to be bleeding.  -There was a 2 sessile polyps that were very close that were 4 to 63m that were not biopsies.  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  -The cecal mass was a poorly differentiated invasive adenocarcinoma.  [facility] [place]  Accession number is [id number]    12/17/2017 Imaging    CT CAP W Contrast 12/17/17  IMPRESSION: 1. Focal area of abnormal enhancement involving the ascending colon may represent mass discovered on recent colonoscopy. 2. No specific findings identified to suggest metastatic disease. 3. 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 Nonspecific. 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 4. Nonspecific 3 mm  right upper lobe lung nodule is noted. No follow-up needed if patient is low-risk. Non-contrast chest CT can be considered in 12 months if patient is high-risk. This recommendation follows the consensus statement: Guidelines for Management of Incidental Pulmonary Nodules Detected on CT Images: From the Fleischner Society 2017; Radiology 2017; 284:228-243. 5. 7 mm low-density structure in segment 8 of the liver is too small to characterize. 6.  Aortic Atherosclerosis (ICD10-I70.0). 7. Gallstones.    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 BBarry Dieneson 02/02/18      02/02/2018 Pathology Results    Diagnosis 02/02/18  Colon, segmental resection for tumor, right - INVASIVE COLORECTAL ADENOCARCINOMA, TWO TUMORS, 5.5 AND 1.8 CM. - LARGER, 5.5 CM TUMOR INVOLVES VISCERAL PERITONEUM - SMALLER, 1.8 CM TUMOR INVOLVES SUBMUCOSA. - ONE EXTRAMURAL SATELLITE TUMOR NODULE. - METASTATIC CARCINOMA IN ONE OF TWENTY-NINE LYMPH NODES (1/29). - TWO TUBULAR ADENOMAS, 0.6 AND 0.8 CM. - BENIGN APPENDIX WITH FIBROSIS OF THE LUMEN.     Chemotherapy    Adjuvnat chemo FOLFOX every 2 weeks for 3-6 months     02/02/2018 Cancer Staging    Staging form: Colon and Rectum, AJCC 8th Edition - Pathologic stage from 02/02/2018: Stage IIIB (pT4a, pN1a, cM0) - Signed by [name] MD on 02/20/2018    03/07/2018 -  Chemotherapy    The patient had palonosetron (ALOXI) injection 0.25 mg, 0.25 mg, Intravenous,  Once, 4 of 4 cycles Administration: 0.25 mg (03/08/2018), 0.25 mg (03/22/2018), 0.25 mg (04/13/2018), 0.25 mg (04/26/2018) pegfilgrastim-cbqv (UDENYCA) injection 6 mg, 6 mg, Subcutaneous, Once,  2 of 2 cycles Administration: 6 mg (04/15/2018), 6 mg (04/28/2018) leucovorin 768 mg in dextrose 5 % 250 mL infusion, 400 mg/m2 = 768 mg, Intravenous,  Once, 4 of 4 cycles Administration: 768 mg (03/08/2018), 768 mg (03/22/2018), 768 mg  (04/13/2018), 768 mg (04/26/2018) oxaliplatin (ELOXATIN) 165 mg in dextrose 5 % 500 mL chemo infusion, 85 mg/m2 = 165 mg, Intravenous,  Once, 4 of 4 cycles Administration: 165 mg (03/08/2018), 165 mg (03/22/2018), 165 mg (04/13/2018), 165 mg (04/26/2018) fluorouracil (ADRUCIL) chemo injection 750 mg, 400 mg/m2 = 750 mg, Intravenous,  Once, 4 of 4 cycles Administration: 750 mg (03/08/2018), 750 mg (03/22/2018), 750 mg (04/13/2018), 750 mg (04/26/2018) fluorouracil (ADRUCIL) 4,600 mg in sodium chloride 0.9 % 58 mL chemo infusion, 2,400 mg/m2 = 4,600 mg, Intravenous, 1 Day/Dose, 4 of 4 cycles Administration: 4,600 mg (03/08/2018), 4,600 mg (03/22/2018), 4,600 mg (04/13/2018), 4,600 mg (04/26/2018)  for chemotherapy treatment.    CURRENT THERAPYAdjuvant FOLFOX q2 weeks, plan for 3-6 months;began8/5  INTERVAL HISTORY: Mr. [name] returns for follow up and cycle 5 FOLFOX. He completed cycle 4 on 04/26/18. He went to ED for generalized weakness, feeling "wobbly," decreased po intake, and nausea with vomiting x2 on 9/28. Leukocytosis and neutrophilia in ED prompted CXR and UA, which were negative. He was given IVF. He notes feeling "deathly sick" after last treatment. He was severely fatigued, but has recovered moderately well today. His appetite and po improved. Has intermittent nausea, controlled with anti-emetic in the AM PRN. Had diarrhea twice last week, resolved with imodium. Denies blood in stool. Denies fever, chills, cough, chest pain, dyspnea, leg edema, neuropathy, mucositis, or pain.    MEDICAL HISTORY:  Past Medical History:  Diagnosis Date  . Anemia   . Cancer (William Oneill)   . Colon cancer (William Oneill)   . Diabetes mellitus without complication (William Oneill)    type 2  . High cholesterol   . History of kidney stones   . Insomnia   . Pneumonia    x1    SURGICAL HISTORY: Past Surgical History:  Procedure Laterality Date  . COLON SURGERY     laparoscopic ascending colectomy  Dr. Barry Dienes 02-02-18  . COLONOSCOPY    .  LAPAROSCOPIC PARTIAL COLECTOMY N/A 02/02/2018   Procedure: LAPAROSCOPIC ASCENDING COLECTOMY;  Surgeon: Stark Klein, MD;  Location: [facility];  Service: General;  Laterality: N/A;  . NO PAST SURGERIES    . PORTACATH PLACEMENT Left 03/03/2018   Procedure: INSERTION PORT-A-CATH;  Surgeon: Stark Klein, MD;  Location: [place];  Service: General;  Laterality: Left;  . TONSILLECTOMY     as a child    I have reviewed the social history and family history with the patient and they are unchanged from previous note.  ALLERGIES:  has No Known Allergies.  MEDICATIONS:  Current Outpatient Medications  Medication Sig Dispense Refill  . acetaminophen (TYLENOL) 325 MG tablet Take 2 tablets (650 mg total) by mouth every 6 (six) hours as needed.    Marland Kitchen atorvastatin (LIPITOR) 10 MG tablet Take 1 tablet (10 mg total) by mouth daily. 90 tablet 1  . blood glucose meter kit and supplies Dispense based on patient and insurance preference. Use up to four times daily as directed. (FOR ICD-9 250.00, 250.01). 1 each 0  . ferrous sulfate 325 (65 FE) MG EC tablet Take 1 tablet (325 mg total) by mouth 3 (three) times daily with meals. 30 tablet 2  . glipiZIDE (GLUCOTROL) 10 MG tablet Take 1 tablet (10 mg  total) by mouth 2 (two) times daily before a meal. 180 tablet 1  . glucose blood (ONE TOUCH ULTRA TEST) test strip USE AS DIRECTED TO TEST UP TO 4 TIMES A DAY 100 each 6  . hydrOXYzine (ATARAX/VISTARIL) 25 MG tablet Take 0.5-1 tablets (12.5-25 mg total) by mouth at bedtime as needed (sleep). 30 tablet 0  . ibuprofen (ADVIL,MOTRIN) 600 MG tablet Take 1 tablet (600 mg total) by mouth every 6 (six) hours as needed (pain not controlled with tylenol). 30 tablet 0  . lidocaine-prilocaine (EMLA) cream Apply to affected area once 30 g 3  . metFORMIN (GLUCOPHAGE) 1000 MG tablet Take 1 tablet (1,000 mg total) by mouth 2 (two) times daily with a meal. 180 tablet 1  . Multiple Vitamin (MULTIVITAMIN WITH MINERALS) TABS  tablet Take 1 tablet by mouth daily.    . ondansetron (ZOFRAN ODT) 4 MG disintegrating tablet Take 1 tablet (4 mg total) by mouth every 8 (eight) hours as needed for nausea or vomiting. 6 tablet 0  . ondansetron (ZOFRAN) 8 MG tablet Take 1 tablet (8 mg total) by mouth 2 (two) times daily as needed for refractory nausea / vomiting. Start on day 3 after chemotherapy. 30 tablet 1  . prochlorperazine (COMPAZINE) 10 MG tablet Take 1 tablet (10 mg total) by mouth every 6 (six) hours as needed (Nausea or vomiting). 30 tablet 1  . sitaGLIPtin (JANUVIA) 100 MG tablet Take 1 tablet (100 mg total) by mouth daily. 90 tablet 1   No current facility-administered medications for this visit.     PHYSICAL EXAMINATION: ECOG PERFORMANCE STATUS: 2 - Symptomatic, <50% confined to bed  Vitals:   05/10/18 0931  BP: (!) 153/74  Pulse: 75  Resp: 17  Temp: 97.8 F (36.6 C)  SpO2: 100%   Filed Weights   05/10/18 0931  Weight: 168 lb 4.8 oz (76.3 kg)    GENERAL:alert, no distress and comfortable SKIN: no rashes or significant lesions EYES: sclera clear OROPHARYNX:no thrush or ulcers LYMPH:  no palpable cervical or supraclavicular lymphadenopathy  LUNGS: clear to auscultation with normal breathing effort HEART: regular rate & rhythm; no lower extremity edema ABDOMEN:abdomen soft, non-tender and normal bowel sounds Musculoskeletal:no cyanosis of digits and no clubbing  NEURO: alert & oriented x 3 with fluent speech, no focal motor/sensory deficits PAC without erythema    LABORATORY DATA:  I have reviewed the data as listed CBC Latest Ref Rng & Units 05/10/2018 05/01/2018 04/26/2018  WBC 4.0 - 10.3 K/uL 13.4(H) 31.6(H) 8.5  Hemoglobin 13.0 - 17.1 g/dL 12.6(L) 13.6 12.6(L)  Hematocrit 38.4 - 49.9 % 38.8 42.2 38.9  Platelets 140 - 400 K/uL 109(L) 157 116(L)     CMP Latest Ref Rng & Units 05/10/2018 05/01/2018 04/26/2018  Glucose 70 - 99 mg/dL 193(H) 224(H) 268(H)  BUN 8 - 23 mg/dL 10 33(H) 13  Creatinine  0.61 - 1.24 mg/dL 0.76 0.77 1.03  Sodium 135 - 145 mmol/L 141 140 143  Potassium 3.5 - 5.1 mmol/L 4.4 4.5 4.4  Chloride 98 - 111 mmol/L 107 104 106  CO2 22 - 32 mmol/L _0 Calcium 8.9 - 10.3 mg/dL 9.1 8.8(L) 9.4  Total Protein 6.5 - 8.1 g/dL 6.6 6.6 6.4(L)  Total Bilirubin 0.3 - 1.2 mg/dL 1.0 2.8(H) 1.0  Alkaline Phos 38 - 126 U/L 174(H) 206(H) 155(H)  AST 15 - 41 U/L 50(H) 40 38  ALT 0 - 44 U/L 39 31 26      RADIOGRAPHIC STUDIES: I  have personally reviewed the radiological images as listed and agreed with the findings in the report. No results found.   ASSESSMENT & PLAN: EDREI NORGAARD a 72 y.o.Caucasianmalewith a history of DM.  1. Cecal Cancer,adenocarcinoma, grade 2, pT4aN1aM0,stage IIIB, MSS 2. Anemia of iron deficiency, s/p IV Feraheme x2 8/21, 9/10  3. DM - on metformin, glipizide, and januvia; PCP recently increased glipizide to 10 mg BID 4. 7 mm liver lesion, seen on staging work up; plan to repeat CT upon completion of chemotherapy to follow the lesion 5. Chemotherapy-induced neutropenia, added Udenyca with cycle 3 6. Severe fatigue, weakness, decreased po intake after cycle 4 requiring ED visit 9/28, secondary to chemotherapy   Mr. Bolar appears stable. He completed 4 cycles adjuvant chemotherapyat full dose. He did not tolerate last cycle well, with severe fatigue, weakness, n/v required ED visit. Work up was negative. He has recovered moderately well, but prefers to postpone cycle 5 to next week. He will return in 1 week for f/u with Dr. Burr Medico and cycle 5, with dose reductions. He does not want to discontinue treatment altogether.   Labs reviewed, still with mild leukocytosis, likely secondary to Udenyca, and thrombocytopenia plt 109K. CMP stable. Will monitor closely. Iron studies and CEA not done today, will check next visit in 1 week. He has been holding oral iron since receiving IV Feraheme on 9/10.   PLAN: -Labs reviewed -Postpone cycle 5 chemo  for 1 week -Lab, flush, f/u, FOLFOX in 1 and 3 weeks    All questions were answered. The patient knows to call the clinic with any problems, questions or concerns. No barriers to learning was detected.     [name], NP 05/10/18

## 2018-05-10 ENCOUNTER — Inpatient Hospital Stay: Payer: Medicare Other

## 2018-05-10 ENCOUNTER — Encounter: Payer: Self-pay | Admitting: Nurse Practitioner

## 2018-05-10 ENCOUNTER — Inpatient Hospital Stay: Payer: Medicare Other | Attending: Hematology

## 2018-05-10 ENCOUNTER — Inpatient Hospital Stay (HOSPITAL_BASED_OUTPATIENT_CLINIC_OR_DEPARTMENT_OTHER): Payer: Medicare Other | Admitting: Nurse Practitioner

## 2018-05-10 ENCOUNTER — Telehealth: Payer: Self-pay | Admitting: Hematology

## 2018-05-10 VITALS — BP 153/74 | HR 75 | Temp 97.8°F | Resp 17 | Ht 66.0 in | Wt 168.3 lb

## 2018-05-10 DIAGNOSIS — R5383 Other fatigue: Secondary | ICD-10-CM | POA: Insufficient documentation

## 2018-05-10 DIAGNOSIS — Z5189 Encounter for other specified aftercare: Secondary | ICD-10-CM | POA: Insufficient documentation

## 2018-05-10 DIAGNOSIS — D701 Agranulocytosis secondary to cancer chemotherapy: Secondary | ICD-10-CM | POA: Diagnosis not present

## 2018-05-10 DIAGNOSIS — E119 Type 2 diabetes mellitus without complications: Secondary | ICD-10-CM | POA: Diagnosis not present

## 2018-05-10 DIAGNOSIS — C180 Malignant neoplasm of cecum: Secondary | ICD-10-CM

## 2018-05-10 DIAGNOSIS — D509 Iron deficiency anemia, unspecified: Secondary | ICD-10-CM | POA: Diagnosis not present

## 2018-05-10 DIAGNOSIS — Z452 Encounter for adjustment and management of vascular access device: Secondary | ICD-10-CM | POA: Insufficient documentation

## 2018-05-10 DIAGNOSIS — Z5111 Encounter for antineoplastic chemotherapy: Secondary | ICD-10-CM | POA: Diagnosis present

## 2018-05-10 LAB — CBC WITH DIFFERENTIAL (CANCER CENTER ONLY)
BASOS ABS: 0.1 10*3/uL (ref 0.0–0.1)
BASOS PCT: 1 %
Eosinophils Absolute: 0.1 10*3/uL (ref 0.0–0.5)
Eosinophils Relative: 1 %
HEMATOCRIT: 38.8 % (ref 38.4–49.9)
Hemoglobin: 12.6 g/dL — ABNORMAL LOW (ref 13.0–17.1)
LYMPHS PCT: 14 %
Lymphs Abs: 1.9 10*3/uL (ref 0.9–3.3)
MCH: 26.2 pg — ABNORMAL LOW (ref 27.2–33.4)
MCHC: 32.5 g/dL (ref 32.0–36.0)
MCV: 80.6 fL (ref 79.3–98.0)
MONO ABS: 0.9 10*3/uL (ref 0.1–0.9)
Monocytes Relative: 7 %
NEUTROS ABS: 10.3 10*3/uL — AB (ref 1.5–6.5)
NEUTROS PCT: 77 %
Platelet Count: 109 10*3/uL — ABNORMAL LOW (ref 140–400)
RBC: 4.82 MIL/uL (ref 4.20–5.82)
RDW: 28.9 % — AB (ref 11.0–14.6)
WBC: 13.4 10*3/uL — AB (ref 4.0–10.3)

## 2018-05-10 LAB — CMP (CANCER CENTER ONLY)
ALK PHOS: 174 U/L — AB (ref 38–126)
ALT: 39 U/L (ref 0–44)
ANION GAP: 8 (ref 5–15)
AST: 50 U/L — ABNORMAL HIGH (ref 15–41)
Albumin: 3.6 g/dL (ref 3.5–5.0)
BILIRUBIN TOTAL: 1 mg/dL (ref 0.3–1.2)
BUN: 10 mg/dL (ref 8–23)
CALCIUM: 9.1 mg/dL (ref 8.9–10.3)
CO2: 26 mmol/L (ref 22–32)
Chloride: 107 mmol/L (ref 98–111)
Creatinine: 0.76 mg/dL (ref 0.61–1.24)
Glucose, Bld: 193 mg/dL — ABNORMAL HIGH (ref 70–99)
POTASSIUM: 4.4 mmol/L (ref 3.5–5.1)
Sodium: 141 mmol/L (ref 135–145)
TOTAL PROTEIN: 6.6 g/dL (ref 6.5–8.1)

## 2018-05-10 NOTE — Telephone Encounter (Signed)
Appts scheduled avs/calendar printed per 10/7 los °

## 2018-05-17 ENCOUNTER — Encounter: Payer: Self-pay | Admitting: Hematology

## 2018-05-17 ENCOUNTER — Inpatient Hospital Stay: Payer: Medicare Other

## 2018-05-17 ENCOUNTER — Inpatient Hospital Stay (HOSPITAL_BASED_OUTPATIENT_CLINIC_OR_DEPARTMENT_OTHER): Payer: Medicare Other | Admitting: Hematology

## 2018-05-17 VITALS — BP 142/71 | HR 71 | Temp 98.1°F | Resp 16 | Ht 66.0 in | Wt 168.7 lb

## 2018-05-17 DIAGNOSIS — C180 Malignant neoplasm of cecum: Secondary | ICD-10-CM

## 2018-05-17 DIAGNOSIS — Z5111 Encounter for antineoplastic chemotherapy: Secondary | ICD-10-CM | POA: Diagnosis not present

## 2018-05-17 DIAGNOSIS — D701 Agranulocytosis secondary to cancer chemotherapy: Secondary | ICD-10-CM

## 2018-05-17 DIAGNOSIS — Z95828 Presence of other vascular implants and grafts: Secondary | ICD-10-CM

## 2018-05-17 DIAGNOSIS — D509 Iron deficiency anemia, unspecified: Secondary | ICD-10-CM

## 2018-05-17 DIAGNOSIS — E119 Type 2 diabetes mellitus without complications: Secondary | ICD-10-CM | POA: Diagnosis not present

## 2018-05-17 DIAGNOSIS — R5383 Other fatigue: Secondary | ICD-10-CM

## 2018-05-17 LAB — CBC WITH DIFFERENTIAL (CANCER CENTER ONLY)
Abs Immature Granulocytes: 0.11 10*3/uL — ABNORMAL HIGH (ref 0.00–0.07)
BASOS PCT: 2 %
Basophils Absolute: 0.1 10*3/uL (ref 0.0–0.1)
EOS ABS: 0.2 10*3/uL (ref 0.0–0.5)
Eosinophils Relative: 4 %
HCT: 40.2 % (ref 39.0–52.0)
Hemoglobin: 13.2 g/dL (ref 13.0–17.0)
Immature Granulocytes: 2 %
Lymphocytes Relative: 22 %
Lymphs Abs: 1.3 10*3/uL (ref 0.7–4.0)
MCH: 26.8 pg (ref 26.0–34.0)
MCHC: 32.8 g/dL (ref 30.0–36.0)
MCV: 81.7 fL (ref 80.0–100.0)
MONO ABS: 0.7 10*3/uL (ref 0.1–1.0)
MONOS PCT: 12 %
Neutro Abs: 3.4 10*3/uL (ref 1.7–7.7)
Neutrophils Relative %: 58 %
PLATELETS: 205 10*3/uL (ref 150–400)
RBC: 4.92 MIL/uL (ref 4.22–5.81)
RDW: 25.9 % — AB (ref 11.5–15.5)
WBC: 5.8 10*3/uL (ref 4.0–10.5)
nRBC: 0 % (ref 0.0–0.2)

## 2018-05-17 LAB — COMPREHENSIVE METABOLIC PANEL
ALT: 32 U/L (ref 0–44)
ANION GAP: 7 (ref 5–15)
AST: 36 U/L (ref 15–41)
Albumin: 3.8 g/dL (ref 3.5–5.0)
Alkaline Phosphatase: 113 U/L (ref 38–126)
BUN: 21 mg/dL (ref 8–23)
CHLORIDE: 106 mmol/L (ref 98–111)
CO2: 28 mmol/L (ref 22–32)
Calcium: 9.2 mg/dL (ref 8.9–10.3)
Creatinine, Ser: 0.72 mg/dL (ref 0.61–1.24)
GFR calc non Af Amer: >60 mL/min (ref >60)
Glucose, Bld: 276 mg/dL — ABNORMAL HIGH (ref 70–99)
POTASSIUM: 4.1 mmol/L (ref 3.5–5.1)
Sodium: 141 mmol/L (ref 135–145)
Total Bilirubin: 1.4 mg/dL — ABNORMAL HIGH (ref 0.3–1.2)
Total Protein: 6.6 g/dL (ref 6.5–8.1)

## 2018-05-17 MED ORDER — OXALIPLATIN CHEMO INJECTION 100 MG/20ML
75.0000 mg/m2 | Freq: Once | INTRAVENOUS | Status: AC
  Administered 2018-05-17: 145 mg via INTRAVENOUS
  Filled 2018-05-17: qty 20

## 2018-05-17 MED ORDER — DEXTROSE 5 % IV SOLN
Freq: Once | INTRAVENOUS | Status: AC
  Administered 2018-05-17: 11:00:00 via INTRAVENOUS
  Filled 2018-05-17: qty 250

## 2018-05-17 MED ORDER — DEXAMETHASONE SODIUM PHOSPHATE 10 MG/ML IJ SOLN
10.0000 mg | Freq: Once | INTRAMUSCULAR | Status: AC
  Administered 2018-05-17: 10 mg via INTRAVENOUS

## 2018-05-17 MED ORDER — PALONOSETRON HCL INJECTION 0.25 MG/5ML
INTRAVENOUS | Status: AC
  Filled 2018-05-17: qty 5

## 2018-05-17 MED ORDER — HEPARIN SOD (PORK) LOCK FLUSH 100 UNIT/ML IV SOLN
500.0000 [IU] | Freq: Once | INTRAVENOUS | Status: DC | PRN
  Filled 2018-05-17: qty 5

## 2018-05-17 MED ORDER — DEXAMETHASONE SODIUM PHOSPHATE 10 MG/ML IJ SOLN
INTRAMUSCULAR | Status: AC
  Filled 2018-05-17: qty 1

## 2018-05-17 MED ORDER — SODIUM CHLORIDE 0.9% FLUSH
10.0000 mL | INTRAVENOUS | Status: DC | PRN
  Administered 2018-05-17: 10 mL via INTRAVENOUS
  Filled 2018-05-17: qty 10

## 2018-05-17 MED ORDER — PALONOSETRON HCL INJECTION 0.25 MG/5ML
0.2500 mg | Freq: Once | INTRAVENOUS | Status: AC
  Administered 2018-05-17: 0.25 mg via INTRAVENOUS

## 2018-05-17 MED ORDER — SODIUM CHLORIDE 0.9 % IV SOLN
2400.0000 mg/m2 | INTRAVENOUS | Status: DC
  Administered 2018-05-17: 4600 mg via INTRAVENOUS
  Filled 2018-05-17: qty 92

## 2018-05-17 MED ORDER — LEUCOVORIN CALCIUM INJECTION 350 MG
400.0000 mg/m2 | Freq: Once | INTRAVENOUS | Status: AC
  Administered 2018-05-17: 768 mg via INTRAVENOUS
  Filled 2018-05-17: qty 38.4

## 2018-05-17 MED ORDER — SODIUM CHLORIDE 0.9% FLUSH
10.0000 mL | INTRAVENOUS | Status: DC | PRN
  Filled 2018-05-17: qty 10

## 2018-05-17 NOTE — Progress Notes (Signed)
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 Benson Norway 12/17/17  IMPRESSION -An infiltrative sessile and ulcerated non obstructing large mass in the cecum. This was noted to be in the cecal cap and was partially circumferential and 2cm in length. It was not noted to be bleeding.  -There was a 2 sessile polyps that were very close that were 4 to 67m that were not biopsies.  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  -The cecal mass was a poorly differentiated invasive adenocarcinoma.  [facility] [place]  Accession number is [id number]    12/17/2017 Imaging    CT CAP W Contrast 12/17/17  IMPRESSION: 1. Focal area of abnormal enhancement involving the ascending colon may represent mass discovered on recent colonoscopy. 2. No specific findings identified to suggest metastatic disease. 3. 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 Nonspecific. 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 4. Nonspecific 3 mm right upper lobe lung nodule is noted. No follow-up needed if patient is low-risk. Non-contrast chest CT can be considered in 12 months if patient is high-risk. This recommendation follows the consensus statement: Guidelines for Management of Incidental Pulmonary Nodules Detected on CT Images: From the Fleischner Society 2017; Radiology 2017; 284:228-243. 5. 7 mm low-density structure in segment 8 of the liver is too small to characterize. 6.  Aortic Atherosclerosis (ICD10-I70.0). 7. Gallstones.    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 BBarry Dieneson 02/02/18      02/02/2018 Pathology Results    Diagnosis 02/02/18  Colon, segmental resection for tumor, right - INVASIVE COLORECTAL ADENOCARCINOMA, TWO TUMORS, 5.5 AND 1.8 CM. - LARGER, 5.5 CM TUMOR INVOLVES VISCERAL PERITONEUM - SMALLER, 1.8 CM TUMOR INVOLVES SUBMUCOSA. - ONE EXTRAMURAL SATELLITE TUMOR NODULE. - METASTATIC CARCINOMA IN ONE OF TWENTY-NINE LYMPH NODES (1/29). - TWO TUBULAR ADENOMAS, 0.6 AND 0.8 CM. - BENIGN APPENDIX WITH FIBROSIS OF THE LUMEN.    02/02/2018 Cancer Staging    Staging form: Colon and Rectum, AJCC 8th Edition - Pathologic stage from 02/02/2018: Stage IIIB (pT4a, pN1a, cM0) - Signed by [name] MD on 02/20/2018    03/08/2018 -  Chemotherapy    Adjuvnat chemo FOLFOX every 2 weeks for 3-6 months starting 03/08/18. Udynaca added at cycle 3 due to neutropenia.      HISTORY OF PRESENTING ILLNESS:   William KEITHLY89y.o. male is a here because of recently diagnosed cecal cancer.  The patient was referred by Dr. Barry Dienes. The patient presents to the clinic today accompanied by himself.   He notes he saw Dr. Nyoka Cowden recently and found blood in his stool and he was referred to Dr. Benson Norway. He went for his first routine colonoscopy with Dr. Benson Norway and CT scan on 12/17/17. Pathology  showed Adenocarcinoma of the cecum. He underwent surgery with Dr. Barry Dienes on 02/02/18 for removal.  Before colonoscopy he denies having concerning symptoms that he noticed and had normal BMs.   Today he notes he is recovering well from surgery well. He notes he is active around the house and he will walk around. He is overall independent and can do things for himself. He did not regularly exercise before. His appetite is overall back to baseline with normal BMs.   Socially is not married and does not have children. He is retired from [employer] and lives with his sister [name], almost 60yo. She left today to visit her son and would like to postpone further visits until she returns next week. He is a non-smoker and does not drink.   He has DM and takes Metformin, glipizide and Junuvia. He denies any heart issues or HTN. He denies family history of cancer although heart issues run in his family.   CURRENT THERAPY  Adjuvant FOLFOX q2 weeks, plan for 3-6 months;began8/5  INTERVAL HISTORY William Oneill is here for follow up and cycle 5 FOLFOX. He presetns to the clinic today. He is here alone. He states that he feels better now and his appetite has improved. He previously had fatigue, low appetite, and nausea. He now has episodes of diarrhea but overall tolerable, but his nausea have improved.      MEDICAL HISTORY:  Past Medical History:  Diagnosis Date  . Anemia   . Cancer ([place])   . Colon cancer ([place])   . Diabetes mellitus without complication ([place])    type 2  . High cholesterol   . History of kidney stones   . Insomnia   . Pneumonia    x1    SURGICAL HISTORY: Past Surgical History:  Procedure Laterality Date  . COLON SURGERY     laparoscopic ascending colectomy  Dr. Barry Dienes 02-02-18  . COLONOSCOPY    . LAPAROSCOPIC PARTIAL COLECTOMY N/A 02/02/2018   Procedure: LAPAROSCOPIC ASCENDING COLECTOMY;  Surgeon: Stark Klein, MD;  Location: [facility];  Service: General;  Laterality: N/A;    . NO PAST SURGERIES    . PORTACATH PLACEMENT Left 03/03/2018   Procedure: INSERTION PORT-A-CATH;  Surgeon: Stark Klein, MD;  Location: [facility];  Service: General;  Laterality: Left;  . TONSILLECTOMY     as a child    SOCIAL HISTORY: Social History   Socioeconomic History  . Marital status: Single    Spouse name: Not on file  . Number of children: Not on file  . Years of education: Not on file  . Highest education level: Not on file  Occupational History  . Not on file  Social Needs  . Financial resource strain: Not on file  . Food insecurity:    Worry: Not on file    Inability: Not on file  . Transportation needs:    Medical: Not on file    Non-medical: Not on file  Tobacco Use  . Smoking status: Never Smoker  . Smokeless tobacco: Never Used  Substance and Sexual Activity  . Alcohol use: No    Alcohol/week: 0.0 standard drinks  .  Drug use: No  . Sexual activity: Yes  Lifestyle  . Physical activity:    Days per week: Not on file    Minutes per session: Not on file  . Stress: Not on file  Relationships  . Social connections:    Talks on phone: Not on file    Gets together: Not on file    Attends religious service: Not on file    Active member of club or organization: Not on file    Attends meetings of clubs or organizations: Not on file    Relationship status: Not on file  . Intimate partner violence:    Fear of current or ex partner: Not on file    Emotionally abused: Not on file    Physically abused: Not on file    Forced sexual activity: Not on file  Other Topics Concern  . Not on file  Social History Narrative  . Not on file    FAMILY HISTORY: Family History  Problem Relation Age of Onset  . Heart disease Father   . Heart attack Father   . Heart disease Sister   . Diabetes Sister   . Stroke Mother     ALLERGIES:  has No Known Allergies.  MEDICATIONS:  Current Outpatient Medications  Medication Sig Dispense Refill  .  acetaminophen (TYLENOL) 325 MG tablet Take 2 tablets (650 mg total) by mouth every 6 (six) hours as needed.    Marland Kitchen atorvastatin (LIPITOR) 10 MG tablet Take 1 tablet (10 mg total) by mouth daily. 90 tablet 1  . blood glucose meter kit and supplies Dispense based on patient and insurance preference. Use up to four times daily as directed. (FOR ICD-9 250.00, 250.01). 1 each 0  . ferrous sulfate 325 (65 FE) MG EC tablet Take 1 tablet (325 mg total) by mouth 3 (three) times daily with meals. 30 tablet 2  . glipiZIDE (GLUCOTROL) 10 MG tablet Take 1 tablet (10 mg total) by mouth 2 (two) times daily before a meal. 180 tablet 1  . glucose blood (ONE TOUCH ULTRA TEST) test strip USE AS DIRECTED TO TEST UP TO 4 TIMES A DAY 100 each 6  . hydrOXYzine (ATARAX/VISTARIL) 25 MG tablet Take 0.5-1 tablets (12.5-25 mg total) by mouth at bedtime as needed (sleep). 30 tablet 0  . ibuprofen (ADVIL,MOTRIN) 600 MG tablet Take 1 tablet (600 mg total) by mouth every 6 (six) hours as needed (pain not controlled with tylenol). 30 tablet 0  . lidocaine-prilocaine (EMLA) cream Apply to affected area once 30 g 3  . metFORMIN (GLUCOPHAGE) 1000 MG tablet Take 1 tablet (1,000 mg total) by mouth 2 (two) times daily with a meal. 180 tablet 1  . Multiple Vitamin (MULTIVITAMIN WITH MINERALS) TABS tablet Take 1 tablet by mouth daily.    . ondansetron (ZOFRAN ODT) 4 MG disintegrating tablet Take 1 tablet (4 mg total) by mouth every 8 (eight) hours as needed for nausea or vomiting. 6 tablet 0  . ondansetron (ZOFRAN) 8 MG tablet Take 1 tablet (8 mg total) by mouth 2 (two) times daily as needed for refractory nausea / vomiting. Start on day 3 after chemotherapy. 30 tablet 1  . prochlorperazine (COMPAZINE) 10 MG tablet Take 1 tablet (10 mg total) by mouth every 6 (six) hours as needed (Nausea or vomiting). 30 tablet 1  . sitaGLIPtin (JANUVIA) 100 MG tablet Take 1 tablet (100 mg total) by mouth daily. 90 tablet 1   No current facility-administered  medications for this visit.  REVIEW OF SYSTEMS:   Constitutional: Denies fevers, chills or abnormal night sweats (+) appetite improved Eyes: Denies blurriness of vision, double vision or watery eyes Ears, nose, mouth, throat, and face: Denies mucositis or sore throat Respiratory: Denies cough, dyspnea or wheezes Cardiovascular: Denies palpitation, chest discomfort or lower extremity swelling Gastrointestinal:  Denies nausea, heartburn (+) intermittent diarrhea Skin: Denies abnormal skin rashes Lymphatics: Denies new lymphadenopathy or easy bruising Neurological:Denies numbness, tingling or new weaknesses Behavioral/Psych: Mood is stable, no new changes  All other systems were reviewed with the patient and are negative.  PHYSICAL EXAMINATION: ECOG PERFORMANCE STATUS: 1 - Symptomatic but completely ambulatory BP (!) 142/71 (BP Location: Left Arm, Patient Position: Sitting)   Pulse 71   Temp 98.1 F (36.7 C) (Oral)   Resp 16   Ht '5\' 6"'$  (1.676 m)   Wt 168 lb 11.2 oz (76.5 kg)   SpO2 98%   BMI 27.23 kg/m   GENERAL:alert, no distress and comfortable SKIN: skin color, texture, turgor are normal, no rashes or significant lesions EYES: normal, conjunctiva are pink and non-injected, sclera clear OROPHARYNX:no exudate, no erythema and lips, buccal mucosa, and tongue normal  NECK: supple, thyroid normal size, non-tender, without nodularity LYMPH:  no palpable lymphadenopathy in the cervical, axillary or inguinal LUNGS: clear to auscultation and percussion with normal breathing effort HEART: regular rate & rhythm and no murmurs and no lower extremity edema ABDOMEN:abdomen soft, non-tender and normal bowel sounds (+) surgical incision healing well  Musculoskeletal:no cyanosis of digits and no clubbing  PSYCH: alert & oriented x 3 with fluent speech NEURO: no focal motor/sensory deficits  LABORATORY DATA:  I have reviewed the data as listed CBC Latest Ref Rng & Units 05/17/2018  05/10/2018 05/01/2018  WBC 4.0 - 10.5 K/uL 5.8 13.4(H) 31.6(H)  Hemoglobin 13.0 - 17.0 g/dL 13.2 12.6(L) 13.6  Hematocrit 39.0 - 52.0 % 40.2 38.8 42.2  Platelets 150 - 400 K/uL 205 109(L) 157    CMP Latest Ref Rng & Units 05/17/2018 05/10/2018 05/01/2018  Glucose 70 - 99 mg/dL 276(H) 193(H) 224(H)  BUN 8 - 23 mg/dL 21 10 33(H)  Creatinine 0.61 - 1.24 mg/dL 0.72 0.76 0.77  Sodium 135 - 145 mmol/L 141 141 140  Potassium 3.5 - 5.1 mmol/L 4.1 4.4 4.5  Chloride 98 - 111 mmol/L 106 107 104  CO2 22 - 32 mmol/L '28 26 28  '$ Calcium 8.9 - 10.3 mg/dL 9.2 9.1 8.8(L)  Total Protein 6.5 - 8.1 g/dL 6.6 6.6 6.6  Total Bilirubin 0.3 - 1.2 mg/dL 1.4(H) 1.0 2.8(H)  Alkaline Phos 38 - 126 U/L 113 174(H) 206(H)  AST 15 - 41 U/L 36 50(H) 40  ALT 0 - 44 U/L 32 39 31     PATHOLOGY   Diagnosis 02/02/18  Colon, segmental resection for tumor, right - INVASIVE COLORECTAL ADENOCARCINOMA, TWO TUMORS, 5.5 AND 1.8 CM. - LARGER, 5.5 CM TUMOR INVOLVES VISCERAL PERITONEUM - SMALLER, 1.8 CM TUMOR INVOLVES SUBMUCOSA. - ONE EXTRAMURAL SATELLITE TUMOR NODULE. - METASTATIC CARCINOMA IN ONE OF TWENTY-NINE LYMPH NODES (1/29). - TWO TUBULAR ADENOMAS, 0.6 AND 0.8 CM. - BENIGN APPENDIX WITH FIBROSIS OF THE LUMEN. Microscopic Comment COLON AND RECTUM: Resection, Including Transanal Disk Excision of Rectal Neoplasms Procedure: Right segmental colon resection Tumor Site: Cecum Tumor Size: 5.5 and 1.8 cm Macroscopic Tumor Perforation: No Histologic Type: Colorectal adenocarcinoma Histologic Grade: Moderately differentiated. Tumor Extension: Visceral peritoneum (5.5 cm tumor) and submucosa (1.8 cm tumor). Margins: Free of tumor Treatment Effect: No Lymphovascular Invasion: Present  Perineural Invasion: No Tumor Deposits: One Regional Lymph Nodes: No lymph nodes submitted or found: 30 Number of Lymph Nodes Involved: 1 Number of Lymph Nodes Examined: 29 Pathologic Stage Classification (pTNM, AJCC 8th Edition): pT4a  (multifocal), pN1a Ancillary Studies: Yes (MMR / MSI testing: pending ) Representative tumor block: 1J Comments: There are two invasive colorectal adenocarcinomas, the largest is 5.5 cm and extends through the 1 of 5 Supplemental copy SUPPLEMENTAL for TAYLOR, LEVICK [phone]) Microscopic Comment(continued) muscularis propria and focally involves the visceral peritoneum. There is a smaller 1.8 cm invasive colorectal carcinoma. which extends into the submucosa. (JDP:kh 02/05/18)    PROCEDURES  Colonoscopy by Dr. Benson Norway 12/17/17  IMPRESSION -An infiltrative sessile and ulcerated non obstructing large mass in the cecum. This was noted to be in the cecal cap and was partially circumferential and 2cm in length. It was not noted to be bleeding.  -There was a 2 sessile polyps that were very close that were 4 to 80m that were not biopsies.  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  -The cecal mass was a poorly differentiated invasive adenocarcinoma.  [facility] [place]  Accession number is [id number]     RADIOGRAPHIC STUDIES: I have personally reviewed the radiological images as listed and agreed with the findings in the report. Dg Chest 2 View  Result Date: 05/01/2018 CLINICAL DATA:  Cough EXAM: CHEST - 2 VIEW COMPARISON:  03/03/2018 FINDINGS: Left-sided central venous port tip over the SVC. No focal airspace disease or effusion. Borderline cardiomegaly. No pneumothorax. IMPRESSION: No active cardiopulmonary disease.  Borderline to mild cardiomegaly. Electronically Signed   By: KDonavan FoilM.D.   On: 05/01/2018 22:22      CT CAP W Contrast 12/17/17  IMPRESSION: 1. Focal area of abnormal enhancement involving the ascending colon may represent mass discovered on recent colonoscopy. 2. No specific findings identified to suggest metastatic disease. 3. Urachal duct remnant  noted with increased soft tissue along the anterior dome of bladder. Nonspecific. 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 4. Nonspecific 3 mm right upper lobe lung nodule is noted. No follow-up needed if patient is low-risk. Non-contrast chest CT can be considered in 12 months if patient is high-risk. This recommendation follows the consensus statement: Guidelines for Management of Incidental Pulmonary Nodules Detected on CT Images: From the Fleischner Society 2017; Radiology 2017; 284:228-243. 5. 7 mm low-density structure in segment 8 of the liver is too small to characterize. 6.  Aortic Atherosclerosis (ICD10-I70.0). 7. Gallstones.  ASSESSMENT & PLAN:  William MCADAMSis a 72y.o. Caucasian male with a history of DM.   1. Cecal Cancer, adenocarcinoma, grade 2, pT4aN1aM0, stage IIIB, MSS -I previously reviewed and discussed his scans and pathology results. He has stage III Invasive right colonl adenocarcinoma and underwent complete resection with 1/29 positive lymph nodes on 02/02/18.  -His restaging CT scan was negative for distant metastasis. -We previously discussed the risk of cancer recurrence.  Due to the T4 a lesion, and positive lymph nodes, he is at high risk for recurrence. -I previously recommended standard adjuvant chemotherapy to reduce his risk of recurrence.  We discussed the option of a proximal versus FOLFOX. Given his advanced age, I recommend FOLFOX every 2 weeks for 6 months due to the T4a lesion and positive lymph node. I discussed the first  3 months are most beneficial and if he can tolerate he can continue for the full 6 months, but okay to shorten his chemo duration if he has significant side effects. Side effects  were discussed with patient in great detail. He agrees to proceed. -Goal of therapy is curative. -He started Adjuvant FOLFOX q2 weeks on8/5. He developed chemo induced  neutro[penia and udenyca was added at cycle 3. He develoed severe fatigue, weakness, decreased po intake after cycle 4 requiring ED visit 9/28 and cycle 5 chemo was postponed for one week  -He has recovered well, although still feel quite fatigued.  Lab reviewed, adequate for treatment, I recommend patient to proceed to cycle 5 chemo today.  He initially did not anticipate to have chemotherapy, but agreed to proceed after discussion.  I will reduce the chemo dose. He understands.  -will f/u one week after this cycle chemo  -return in 2 week for f/u and cycle 6 chemo    2. Anemia  -His has had mild to moderate anemia lately plus of iron deficiency earlier this year -He previously received blood transfusion on 02/07/18  -I previously prescribed him oral iron ferrous sulfate to take once daily for at least 3 months.  -Will monitor CBC and iron level  -Hg at 13.2 today    3. DM -On Metformin, glipizide and Junuvia -Continue to follow up with PCP for management  -We monitor his glucose level closely during the chemo, due to the premedication dexamethasone, his glucose level is likely going to be high and medication adjustment may needed, I discussed with him.  4. 80m liver lesion  -His staging CT scan with contrast showed a 7 mm low-density structure in segment 8 of the liver, unlikely metastasis, but need to be followed. -Plans to repeat CT scan when he completes adjuvant chemo    PLAN:  -Lab reviewed, adequate for treatment, will proceed to cycle 5 FOLFOX today, with dose reduction of oxaliplatin to '75mg'$ /m2 and omit 5-FU bolus.   -Lab, flush and f/u with Lacie on 10/22 -next chemo in 2 weeks   All questions were answered. The patient knows to call the clinic with any problems, questions or concerns. I spent 25 minutes counseling the patient face to face. The total time spent in the appointment was 30 minutes and more than 50% was on counseling.  IDierdre SearlesDweik am acting as scribe for Dr.  YTruitt Merle  I have reviewed the above documentation for accuracy and completeness, and I agree with the above.      YTruitt Merle MD 05/17/2018

## 2018-05-17 NOTE — Patient Instructions (Signed)
[facility] Discharge Instructions for Patients Receiving Chemotherapy  Today you received the following chemotherapy agents Oxaliplatin, Leucovorin, 5FU  To help prevent nausea and vomiting after your treatment, we encourage you to take your nausea medication as directed   If you develop nausea and vomiting that is not controlled by your nausea medication, call the clinic.   BELOW ARE SYMPTOMS THAT SHOULD BE REPORTED IMMEDIATELY:  *FEVER GREATER THAN 100.5 F  *CHILLS WITH OR WITHOUT FEVER  NAUSEA AND VOMITING THAT IS NOT CONTROLLED WITH YOUR NAUSEA MEDICATION  *UNUSUAL SHORTNESS OF BREATH  *UNUSUAL BRUISING OR BLEEDING  TENDERNESS IN MOUTH AND THROAT WITH OR WITHOUT PRESENCE OF ULCERS  *URINARY PROBLEMS  *BOWEL PROBLEMS  UNUSUAL RASH Items with * indicate a potential emergency and should be followed up as soon as possible.  Feel free to call the clinic should you have any questions or concerns. The clinic phone number is [phone].  Please show the CHEMO ALERT CARD at check-in to the Emergency Department and triage nurse.   

## 2018-05-18 ENCOUNTER — Encounter: Payer: Self-pay | Admitting: Hematology

## 2018-05-18 LAB — IRON AND TIBC
Iron: 98 ug/dL (ref 42–163)
Saturation Ratios: 27 % — ABNORMAL LOW (ref 42–163)
TIBC: 369 ug/dL (ref 202–409)
UIBC: 271 ug/dL

## 2018-05-18 LAB — CEA (IN HOUSE-CHCC): CEA (CHCC-IN HOUSE): 3.52 ng/mL (ref 0.00–5.00)

## 2018-05-18 LAB — FERRITIN: FERRITIN: 326 ng/mL (ref 24–336)

## 2018-05-19 ENCOUNTER — Inpatient Hospital Stay: Payer: Medicare Other

## 2018-05-19 ENCOUNTER — Telehealth: Payer: Self-pay | Admitting: Hematology

## 2018-05-19 VITALS — BP 162/77 | HR 66 | Resp 18

## 2018-05-19 DIAGNOSIS — Z5111 Encounter for antineoplastic chemotherapy: Secondary | ICD-10-CM | POA: Diagnosis not present

## 2018-05-19 DIAGNOSIS — C180 Malignant neoplasm of cecum: Secondary | ICD-10-CM

## 2018-05-19 MED ORDER — SODIUM CHLORIDE 0.9% FLUSH
10.0000 mL | INTRAVENOUS | Status: DC | PRN
  Administered 2018-05-19: 10 mL
  Filled 2018-05-19: qty 10

## 2018-05-19 MED ORDER — PEGFILGRASTIM-CBQV 6 MG/0.6ML ~~LOC~~ SOSY
PREFILLED_SYRINGE | SUBCUTANEOUS | Status: AC
  Filled 2018-05-19: qty 0.6

## 2018-05-19 MED ORDER — HEPARIN SOD (PORK) LOCK FLUSH 100 UNIT/ML IV SOLN
500.0000 [IU] | Freq: Once | INTRAVENOUS | Status: AC | PRN
  Administered 2018-05-19: 500 [IU]
  Filled 2018-05-19: qty 5

## 2018-05-19 MED ORDER — PEGFILGRASTIM-CBQV 6 MG/0.6ML ~~LOC~~ SOSY
6.0000 mg | PREFILLED_SYRINGE | Freq: Once | SUBCUTANEOUS | Status: AC
  Administered 2018-05-19: 6 mg via SUBCUTANEOUS

## 2018-05-20 ENCOUNTER — Other Ambulatory Visit: Payer: Self-pay

## 2018-05-20 NOTE — Patient Outreach (Signed)
[facility]) Care Management  05/20/2018  ZAKKARY THIBAULT 1945/09/08 [id number]   Medication Adherence call to Mr. Royal Vandevoort left a message for patient to call back patient is due on Atorvastatin 10 mg under [facility].   [facility] Management Direct Dial [phone]  Fax [phone] [email or web address]

## 2018-05-21 NOTE — Telephone Encounter (Signed)
appts added patient notified per 10/16 staff message

## 2018-05-24 ENCOUNTER — Ambulatory Visit: Payer: Medicare Other | Admitting: Hematology

## 2018-05-24 ENCOUNTER — Telehealth: Payer: Self-pay

## 2018-05-24 ENCOUNTER — Ambulatory Visit: Payer: Medicare Other

## 2018-05-24 ENCOUNTER — Other Ambulatory Visit: Payer: Medicare Other

## 2018-05-24 NOTE — Telephone Encounter (Signed)
Printed patient a new calender due to appointment changed made. Per 10/21 patient walk in. Sent Ernestene Kiel a scheduled msg tto respond to these appointment changes, and to see if she could adjust her current appointment time with (high priority scheduler). Patient is aware to still come in at 12 noon if he does not here from high priority scheduler

## 2018-05-25 ENCOUNTER — Other Ambulatory Visit: Payer: Self-pay | Admitting: Hematology

## 2018-05-25 ENCOUNTER — Encounter: Payer: Self-pay | Admitting: Nurse Practitioner

## 2018-05-25 ENCOUNTER — Inpatient Hospital Stay (HOSPITAL_BASED_OUTPATIENT_CLINIC_OR_DEPARTMENT_OTHER): Payer: Medicare Other | Admitting: Nurse Practitioner

## 2018-05-25 ENCOUNTER — Inpatient Hospital Stay: Payer: Medicare Other | Admitting: Nutrition

## 2018-05-25 ENCOUNTER — Inpatient Hospital Stay: Payer: Medicare Other

## 2018-05-25 ENCOUNTER — Other Ambulatory Visit: Payer: Medicare Other

## 2018-05-25 VITALS — BP 144/84 | HR 71 | Temp 98.5°F | Resp 20 | Ht 66.0 in | Wt 165.1 lb

## 2018-05-25 DIAGNOSIS — D701 Agranulocytosis secondary to cancer chemotherapy: Secondary | ICD-10-CM | POA: Diagnosis not present

## 2018-05-25 DIAGNOSIS — D509 Iron deficiency anemia, unspecified: Secondary | ICD-10-CM

## 2018-05-25 DIAGNOSIS — C180 Malignant neoplasm of cecum: Secondary | ICD-10-CM

## 2018-05-25 DIAGNOSIS — Z5111 Encounter for antineoplastic chemotherapy: Secondary | ICD-10-CM | POA: Diagnosis not present

## 2018-05-25 DIAGNOSIS — E119 Type 2 diabetes mellitus without complications: Secondary | ICD-10-CM

## 2018-05-25 DIAGNOSIS — R5383 Other fatigue: Secondary | ICD-10-CM

## 2018-05-25 LAB — CMP (CANCER CENTER ONLY)
ALBUMIN: 3.6 g/dL (ref 3.5–5.0)
ALT: 28 U/L (ref 0–44)
AST: 35 U/L (ref 15–41)
Alkaline Phosphatase: 285 U/L — ABNORMAL HIGH (ref 38–126)
Anion gap: 9 (ref 5–15)
BUN: 18 mg/dL (ref 8–23)
CO2: 26 mmol/L (ref 22–32)
Calcium: 9.3 mg/dL (ref 8.9–10.3)
Chloride: 104 mmol/L (ref 98–111)
Creatinine: 0.96 mg/dL (ref 0.61–1.24)
GFR, Est AFR Am: >60 mL/min (ref >60)
GFR, Estimated: >60 mL/min (ref >60)
GLUCOSE: 303 mg/dL — AB (ref 70–99)
Potassium: 4.1 mmol/L (ref 3.5–5.1)
SODIUM: 139 mmol/L (ref 135–145)
Total Bilirubin: 1.1 mg/dL (ref 0.3–1.2)
Total Protein: 6.9 g/dL (ref 6.5–8.1)

## 2018-05-25 LAB — CBC WITH DIFFERENTIAL (CANCER CENTER ONLY)
Abs Immature Granulocytes: 0.51 10*3/uL — ABNORMAL HIGH (ref 0.00–0.07)
BASOS ABS: 0.3 10*3/uL — AB (ref 0.0–0.1)
Basophils Relative: 1 %
EOS ABS: 0.3 10*3/uL (ref 0.0–0.5)
Eosinophils Relative: 1 %
HEMATOCRIT: 39.3 % (ref 39.0–52.0)
HEMOGLOBIN: 12.8 g/dL — AB (ref 13.0–17.0)
IMMATURE GRANULOCYTES: 2 %
LYMPHS ABS: 1.8 10*3/uL (ref 0.7–4.0)
LYMPHS PCT: 8 %
MCH: 27.2 pg (ref 26.0–34.0)
MCHC: 32.6 g/dL (ref 30.0–36.0)
MCV: 83.6 fL (ref 80.0–100.0)
Monocytes Absolute: 1.9 10*3/uL — ABNORMAL HIGH (ref 0.1–1.0)
Monocytes Relative: 8 %
NEUTROS PCT: 80 %
NRBC: 0 % (ref 0.0–0.2)
Neutro Abs: 19.7 10*3/uL — ABNORMAL HIGH (ref 1.7–7.7)
Platelet Count: 180 10*3/uL (ref 150–400)
RBC: 4.7 MIL/uL (ref 4.22–5.81)
RDW: 25 % — ABNORMAL HIGH (ref 11.5–15.5)
WBC: 24.5 10*3/uL — AB (ref 4.0–10.5)

## 2018-05-25 MED ORDER — PROCHLORPERAZINE MALEATE 10 MG PO TABS: 10.0000 mg | ORAL_TABLET | Freq: Four times a day (QID) | ORAL | 1 refills | Status: DC | PRN

## 2018-05-25 MED ORDER — HEPARIN SOD (PORK) LOCK FLUSH 100 UNIT/ML IV SOLN
500.0000 [IU] | Freq: Once | INTRAVENOUS | Status: AC | PRN
  Administered 2018-05-25: 500 [IU]
  Filled 2018-05-25: qty 5

## 2018-05-25 MED ORDER — SODIUM CHLORIDE 0.9% FLUSH
3.0000 mL | Freq: Once | INTRAVENOUS | Status: DC | PRN
  Filled 2018-05-25: qty 10

## 2018-05-25 MED ORDER — HEPARIN SOD (PORK) LOCK FLUSH 100 UNIT/ML IV SOLN
250.0000 [IU] | Freq: Once | INTRAVENOUS | Status: DC | PRN
  Filled 2018-05-25: qty 5

## 2018-05-25 MED ORDER — SODIUM CHLORIDE 0.9% FLUSH
10.0000 mL | Freq: Once | INTRAVENOUS | Status: AC | PRN
  Administered 2018-05-25: 10 mL
  Filled 2018-05-25: qty 10

## 2018-05-25 NOTE — Progress Notes (Signed)
72 year old male diagnosed with cecal cancer.   He is a patient of Dr. Burr Medico.  Past medical history includes diabetes and anemia.  Medications include Glucotrol, Glucophage, multivitamin, Zofran, Compazine, and Januvia.  Labs include glucose of 276 on October 14. Height: 66 inches. Weight: 163.8 pounds October 2. Usual body weight: 186 pounds in July. BMI: 26.44.  Patient reports he has early satiety.   He has experienced nausea and vomiting.  Reports that nausea medications work most of the time. Reports he drinks 2-3 Glucerna's daily.   He reports his primary physician monitors his blood sugars and has told him it is okay that they are higher than normal.  Nutrition diagnosis:  Unintended weight loss related to poor appetite and early satiety as evidenced by 12% weight loss in less than 3 months.  Intervention: Reviewed strategies for increasing calories and protein in smaller more frequent meals and snacks. Provided fact sheets on high-protein foods and ways to increase calories and protein. Recommended patient increase Glucerna 4 times daily.  Provided coupons. Educated patient we may need to switch to Ensure Enlive if weight continues to decline. Questions were answered and teach back method used.  Contact information provided  Monitoring, evaluation, goals: Patient will tolerate increased calories and protein for weight maintenance.  Next visit: Monday, October 28 during infusion.  **Disclaimer: This note was dictated with voice recognition software. Similar sounding words can inadvertently be transcribed and this note may contain transcription errors which may not have been corrected upon publication of note.**

## 2018-05-25 NOTE — Progress Notes (Signed)
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 Benson Norway 12/17/17  IMPRESSION -An infiltrative sessile and ulcerated non obstructing large mass in the cecum. This was noted to be in the cecal cap and was partially circumferential and 2cm in length. It was not noted to be bleeding.  -There was a 2 sessile polyps that were very close that were 4 to 58m that were not biopsies.  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  -The cecal mass was a poorly differentiated invasive adenocarcinoma.  [facility] [place]  Accession number is [id number]    12/17/2017 Imaging    CT CAP W Contrast 12/17/17  IMPRESSION: 1. Focal area of abnormal enhancement involving the ascending colon may represent mass discovered on recent colonoscopy. 2. No specific findings identified to suggest metastatic disease. 3. 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 Nonspecific. 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 4. Nonspecific 3 mm  right upper lobe lung nodule is noted. No follow-up needed if patient is low-risk. Non-contrast chest CT can be considered in 12 months if patient is high-risk. This recommendation follows the consensus statement: Guidelines for Management of Incidental Pulmonary Nodules Detected on CT Images: From the Fleischner Society 2017; Radiology 2017; 284:228-243. 5. 7 mm low-density structure in segment 8 of the liver is too small to characterize. 6.  Aortic Atherosclerosis (ICD10-I70.0). 7. Gallstones.    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 BBarry Dieneson 02/02/18      02/02/2018 Pathology Results    Diagnosis 02/02/18  Colon, segmental resection for tumor, right - INVASIVE COLORECTAL ADENOCARCINOMA, TWO TUMORS, 5.5 AND 1.8 CM. - LARGER, 5.5 CM TUMOR INVOLVES VISCERAL PERITONEUM - SMALLER, 1.8 CM TUMOR INVOLVES SUBMUCOSA. - ONE EXTRAMURAL SATELLITE TUMOR NODULE. - METASTATIC CARCINOMA IN ONE OF TWENTY-NINE LYMPH NODES (1/29). - TWO TUBULAR ADENOMAS, 0.6 AND 0.8 CM. - BENIGN APPENDIX WITH FIBROSIS OF THE LUMEN.    02/02/2018 Cancer Staging    Staging form: Colon and Rectum, AJCC 8th Edition - Pathologic stage from 02/02/2018: Stage IIIB (pT4a, pN1a, cM0) - Signed by William Merle MD on 02/20/2018    03/08/2018 -  Chemotherapy    Adjuvnat chemo FOLFOX every 2 weeks for 3-6 months starting 03/08/18. Udynaca added at cycle 3 due to neutropenia.    CURRENT THERAPYAdjuvant FOLFOX q2 weeks, plan for 3-6 months;began8/5  INTERVAL HISTORY: Mr. ABabingtonreturns for follow up and next cycle FOLFOX as scheduled. He received cycle 5 on 10/14; he is here for toxicity  check up. He reports he was "sick" with last cycle with increased fatigue and weakness with nausea and vomiting x2 days after last cycle. He did not it was better than cycle 4, but he still "struggled." He vomited 2 or 3 [place] which resolved with compazine. Does not take zofran. Denies recent constipation or diarrhea. Drinks 3-4 glucerna per day. Saw dietician today. BG ranges 170-180's at home, did have isolated BG >300 yesterday. he will see PCP 10/24. Denies abdominal pain or mucositis. No recent fever, chills, cough, chest pain, leg edema, or neuropathy.    MEDICAL HISTORY:  Past Medical History:  Diagnosis Date  . Anemia   . Cancer ([place])   . Colon cancer ([place])   . Diabetes mellitus without complication ([place])    type 2  . High cholesterol   . History of kidney stones   . Insomnia   . Pneumonia    x1    SURGICAL HISTORY: Past Surgical History:  Procedure Laterality Date  . COLON SURGERY     laparoscopic ascending colectomy  Dr. Barry Oneill 02-02-18  . COLONOSCOPY    . LAPAROSCOPIC PARTIAL COLECTOMY N/A 02/02/2018   Procedure: LAPAROSCOPIC ASCENDING COLECTOMY;  Surgeon: William Klein, MD;  Location: [facility];  Service: General;  Laterality: N/A;  . NO PAST SURGERIES    . PORTACATH PLACEMENT Left 03/03/2018   Procedure: INSERTION PORT-A-CATH;  Surgeon: William Klein, MD;  Location: [place];  Service: General;  Laterality: Left;  . TONSILLECTOMY     as a child    I have reviewed the social history and family history with the patient and they are unchanged from previous note.  ALLERGIES:  has No Known Allergies.  MEDICATIONS:  Current Outpatient Medications  Medication Sig Dispense Refill  . acetaminophen (TYLENOL) 325 MG tablet Take 2 tablets (650 mg total) by mouth every 6 (six) hours as needed.    Marland Kitchen atorvastatin (LIPITOR) 10 MG tablet Take 1 tablet (10 mg total) by mouth daily. 90 tablet 1  . blood glucose meter kit and supplies Dispense based on patient and insurance preference. Use up to four times daily as directed. (FOR ICD-9 250.00, 250.01). 1 each 0  . glipiZIDE (GLUCOTROL) 10 MG tablet Take 1 tablet (10 mg total) by mouth 2 (two) times daily before a meal. 180  tablet 1  . glucose blood (ONE TOUCH ULTRA TEST) test strip USE AS DIRECTED TO TEST UP TO 4 TIMES A DAY 100 each 6  . hydrOXYzine (ATARAX/VISTARIL) 25 MG tablet Take 0.5-1 tablets (12.5-25 mg total) by mouth at bedtime as needed (sleep). 30 tablet 0  . ibuprofen (ADVIL,MOTRIN) 600 MG tablet Take 1 tablet (600 mg total) by mouth every 6 (six) hours as needed (pain not controlled with tylenol). 30 tablet 0  . lidocaine-prilocaine (EMLA) cream Apply to affected area once 30 g 3  . metFORMIN (GLUCOPHAGE) 1000 MG tablet Take 1 tablet (1,000 mg total) by mouth 2 (two) times daily with a meal. 180 tablet 1  . Multiple Vitamin (MULTIVITAMIN WITH MINERALS) TABS tablet Take 1 tablet by mouth daily.    . ondansetron (ZOFRAN ODT) 4 MG disintegrating tablet Take 1 tablet (4 mg total) by mouth every 8 (eight) hours as needed for nausea or vomiting. 6 tablet 0  . ondansetron (ZOFRAN) 8 MG tablet Take 1 tablet (8 mg total) by mouth 2 (two) times daily as needed for refractory nausea / vomiting. Start on day  3 after chemotherapy. 30 tablet 1  . prochlorperazine (COMPAZINE) 10 MG tablet Take 1 tablet (10 mg total) by mouth every 6 (six) hours as needed (Nausea or vomiting). 30 tablet 1  . sitaGLIPtin (JANUVIA) 100 MG tablet Take 1 tablet (100 mg total) by mouth daily. 90 tablet 1  . ferrous sulfate 325 (65 FE) MG EC tablet Take 1 tablet (325 mg total) by mouth 3 (three) times daily with meals. 30 tablet 2   No current facility-administered medications for this visit.     PHYSICAL EXAMINATION: ECOG PERFORMANCE STATUS: 1 - Symptomatic but completely ambulatory  Vitals:   05/25/18 1400  BP: (!) 144/84  Pulse: 71  Resp: 20  Temp: 98.5 F (36.9 C)  SpO2: 99%   Filed Weights   05/25/18 1400  Weight: 165 lb 1.6 oz (74.9 kg)    GENERAL:alert, no distress and comfortable SKIN: no rashes or significant lesions. Mild erythema to frontal area and scalp  EYES:  sclera clear OROPHARYNX:no thrush or  ulcers LYMPH:  no palpable cervical or supraclavicular lymphadenopathy LUNGS: clear to auscultation with normal breathing effort HEART: regular rate & rhythm, no lower extremity edema ABDOMEN:abdomen soft, non-tender and normal bowel sounds Musculoskeletal:no cyanosis of digits and no clubbing  NEURO: alert & oriented x 3 with fluent speech, no focal motor/sensory deficits PAC without erythema    LABORATORY DATA:  I have reviewed the data as listed CBC Latest Ref Rng & Units 05/25/2018 05/17/2018 05/10/2018  WBC 4.0 - 10.5 K/uL 24.5(H) 5.8 13.4(H)  Hemoglobin 13.0 - 17.0 g/dL 12.8(L) 13.2 12.6(L)  Hematocrit 39.0 - 52.0 % 39.3 40.2 38.8  Platelets 150 - 400 K/uL 180 205 109(L)     CMP Latest Ref Rng & Units 05/25/2018 05/17/2018 05/10/2018  Glucose 70 - 99 mg/dL 303(H) 276(H) 193(H)  BUN 8 - 23 mg/dL _0 Creatinine 0.61 - 1.24 mg/dL 0.96 0.72 0.76  Sodium 135 - 145 mmol/L 139 141 141  Potassium 3.5 - 5.1 mmol/L 4.1 4.1 4.4  Chloride 98 - 111 mmol/L 104 106 107  CO2 22 - 32 mmol/L _1 Calcium 8.9 - 10.3 mg/dL 9.3 9.2 9.1  Total Protein 6.5 - 8.1 g/dL 6.9 6.6 6.6  Total Bilirubin 0.3 - 1.2 mg/dL 1.1 1.4(H) 1.0  Alkaline Phos 38 - 126 U/L 285(H) 113 174(H)  AST 15 - 41 U/L 35 36 50(H)  ALT 0 - 44 U/L 28 32 39      RADIOGRAPHIC STUDIES: I have personally reviewed the radiological images as listed and agreed with the findings in the report. No results found.   ASSESSMENT & PLAN: William Oneill a 72 y.o.Caucasianmalewith a history of DM.  1. Cecal Cancer,adenocarcinoma, grade 2, pT4aN1aM0,stage IIIB, MSS 2. Anemia of iron deficiency, s/p IV Feraheme x2 8/21, 9/10  3. DM- on metformin, glipizide, and januvia; PCP recently increased glipizide to 10 mg BID 4. 7 mm liver lesion, seen on staging work up; plan to repeat CT upon completion of chemotherapy to follow the lesion 5. Chemotherapy-induced neutropenia, added Udenyca with cycle 3 6. Severe fatigue,  weakness, decreased po intake after cycle 4 requiring ED visit 9/28, secondary to chemotherapy; oxaliplatin dose-reduced with cycle 5  William Oneill appears stable. He completed 5 cycles adjuvant FOLFOX; cycle 5 was delayed 1 week and oxaliplatin dose-reducted due to fatigue, weakness, and decreased po intake. He tolerated a little better, but continues to have fatigue and n/v x2 days after treatment. He has recovered  moderately well today. He agrees to continue treatment, but is requesting an additional week off. I explained the rationale for adjuvant chemo q2 weeks for his stage IIIB disease, he understands. Labs reviewed. Leukocytosis likely related to [facility]. BG 303, he is compliant with DM medication; He will see PCP 10/24 to monitor and possibly adjust medication. Will postpone cycle 6 by 1 additional week per patient's request; he will return in 2 weeks for f/u and cycle 6 FOLFOX.  PLAN: -Labs reviewed -Refilled compazine -Return in 2 weeks for f/u and cycle 6, then continue FOLFOX x2 weeks   All questions were answered. The patient knows to call the clinic with any problems, questions or concerns. No barriers to learning was detected.    [name], NP 05/25/18

## 2018-05-27 ENCOUNTER — Other Ambulatory Visit: Payer: Self-pay

## 2018-05-27 ENCOUNTER — Ambulatory Visit (INDEPENDENT_AMBULATORY_CARE_PROVIDER_SITE_OTHER): Payer: Medicare Other | Admitting: Family Medicine

## 2018-05-27 ENCOUNTER — Encounter: Payer: Self-pay | Admitting: Family Medicine

## 2018-05-27 VITALS — BP 146/60 | HR 98 | Temp 97.5°F | Ht 67.0 in | Wt 166.6 lb

## 2018-05-27 DIAGNOSIS — E1165 Type 2 diabetes mellitus with hyperglycemia: Secondary | ICD-10-CM | POA: Diagnosis not present

## 2018-05-27 LAB — POCT GLYCOSYLATED HEMOGLOBIN (HGB A1C): HEMOGLOBIN A1C: 8.9 % — AB (ref 4.0–5.6)

## 2018-05-27 LAB — GLUCOSE, POCT (MANUAL RESULT ENTRY): POC Glucose: 307 mg/dl — AB (ref 70–99)

## 2018-05-27 MED ORDER — PEN NEEDLES 32G X 4 MM MISC: 1.0000 "application " | Freq: Every day | 3 refills | Status: DC

## 2018-05-27 MED ORDER — INSULIN GLARGINE 100 UNIT/ML SOLOSTAR PEN: 8.0000 [IU] | PEN_INJECTOR | Freq: Every day | SUBCUTANEOUS | 99 refills | Status: DC

## 2018-05-27 MED ORDER — ACCU-CHEK SOFT TOUCH LANCETS MISC: 12 refills | Status: DC

## 2018-05-27 NOTE — Progress Notes (Signed)
Subjective:  By signing my name below, I, William Oneill, attest that this documentation has been prepared under the direction and in the presence of [name], MD Electronically Signed: [name], ED Scribe 05/27/2018 at 9:59 AM.   Patient ID: William Oneill, male    DOB: 10-12-1945, 72 y.o.   MRN: [id number]  Chief Complaint  Patient presents with  . Diabetes    6 week f/u    HPI William Oneill is a 72 y.o. male who presents to Primary Care at [facility] for f/u. 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 Seen by CA center 2 days ago. Having some fatigue with n/v with chemo, planned for doing cycle 6 for 1 additional wk. - Pt states that his mood is better than last OV.  DM Lab Results  Component Value Date   HGBA1C 8.6 (H) 01/26/2018  Increased glipizide to 10 mg at breakfast, 10 mg bid at 9/12 OV. Continued on metformin and januvia. Last glucose was 303 at last OV. - Pt reports fluctuating blood glucose reading of 172-200 with 1 reading in 300s. Denies n/v, urinary freq.  Elevated BP Lab Results  Component Value Date   CREATININE 0.96 05/25/2018   BP Readings from Last 3 Encounters:  05/27/18 (!) 146/60  05/25/18 (!) 144/84  05/19/18 (!) 162/77  Not currently on antihypertensives. - Denies cp, sob, blurred vision.  Patient Active Problem List   Diagnosis Date Noted  . 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 Generalized weakness 04/11/2015  . Acute purulent bronchitis 04/11/2015  . Dehydration 04/11/2015  . Nocturia more than twice per night 01/16/2015  . Snoring 01/16/2015  . 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 Obese abdomen 01/16/2015  . Other fatigue 01/16/2015  . Type 2 diabetes mellitus without complication ([place]) 76/72/0947  . Insomnia    Past Medical History:  Diagnosis Date  . Anemia   . Cancer ([place])   . Colon cancer ([place])   . Diabetes mellitus without complication ([place])    type 2  . High  cholesterol   . History of kidney stones   . Insomnia   . Pneumonia    x1   Past Surgical History:  Procedure Laterality Date  . COLON SURGERY     laparoscopic ascending colectomy  Dr. Barry Dienes 02-02-18  . COLONOSCOPY    . LAPAROSCOPIC PARTIAL COLECTOMY N/A 02/02/2018   Procedure: LAPAROSCOPIC ASCENDING COLECTOMY;  Surgeon: Stark Klein, MD;  Location: [facility];  Service: General;  Laterality: N/A;  . NO PAST SURGERIES    . PORTACATH PLACEMENT Left 03/03/2018   Procedure: INSERTION PORT-A-CATH;  Surgeon: Stark Klein, MD;  Location: [facility];  Service: General;  Laterality: Left;  . TONSILLECTOMY     as a child   No Known Allergies Prior to Admission medications   Medication Sig Start Date End Date Taking? Authorizing Provider  acetaminophen (TYLENOL) 325 MG tablet Take 2 tablets (650 mg total) by mouth every 6 (six) hours as needed. 02/06/18  Yes [name], MD  atorvastatin (LIPITOR) 10 MG tablet Take 1 tablet (10 mg total) by mouth daily. 11/12/17  Yes [name], MD  blood glucose meter kit and supplies Dispense based on patient and insurance preference. Use up to four times daily as directed. (FOR ICD-9 250.00, 250.01). 12/11/14  Yes [name], MD  ferrous sulfate 325 (65 FE) MG EC tablet Take 1 tablet (325 mg total)  by mouth 3 (three) times daily with meals. 02/19/18  Yes [name], MD  glipiZIDE (GLUCOTROL) 10 MG tablet Take 1 tablet (10 mg total) by mouth 2 (two) times daily before a meal. 04/15/18  Yes [name], MD  glucose blood (ONE TOUCH ULTRA TEST) test strip USE AS DIRECTED TO TEST UP TO 4 TIMES A DAY 12/07/17  Yes [name], MD  hydrOXYzine (ATARAX/VISTARIL) 25 MG tablet Take 0.5-1 tablets (12.5-25 mg total) by mouth at bedtime as needed (sleep). 03/01/18  Yes [name], MD  ibuprofen (ADVIL,MOTRIN) 600 MG tablet Take 1 tablet (600 mg total) by mouth every 6 (six) hours as needed (pain not controlled with tylenol). 02/06/18  Yes  [name], MD  lidocaine-prilocaine (EMLA) cream Apply to affected area once 02/20/18  Yes [name], MD  metFORMIN (GLUCOPHAGE) 1000 MG tablet Take 1 tablet (1,000 mg total) by mouth 2 (two) times daily with a meal. 03/01/18  Yes [name], MD  Multiple Vitamin (MULTIVITAMIN WITH MINERALS) TABS tablet Take 1 tablet by mouth daily.   Yes [provider]  ondansetron (ZOFRAN ODT) 4 MG disintegrating tablet Take 1 tablet (4 mg total) by mouth every 8 (eight) hours as needed for nausea or vomiting. 05/01/18  Yes [name], PA-C  ondansetron (ZOFRAN) 8 MG tablet Take 1 tablet (8 mg total) by mouth 2 (two) times daily as needed for refractory nausea / vomiting. Start on day 3 after chemotherapy. 02/20/18  Yes [name], MD  prochlorperazine (COMPAZINE) 10 MG tablet Take 1 tablet (10 mg total) by mouth every 6 (six) hours as needed (Nausea or vomiting). 05/25/18  Yes [name], NP  sitaGLIPtin (JANUVIA) 100 MG tablet Take 1 tablet (100 mg total) by mouth daily. 03/01/18  Yes [name], MD   Social History   Socioeconomic History  . Marital status: Single    Spouse name: Not on file  . Number of children: Not on file  . Years of education: Not on file  . Highest education level: Not on file  Occupational History  . Not on file  Social Needs  . Financial resource strain: Not on file  . Food insecurity:    Worry: Not on file    Inability: Not on file  . Transportation needs:    Medical: Not on file    Non-medical: Not on file  Tobacco Use  . Smoking status: Never Smoker  . Smokeless tobacco: Never Used  Substance and Sexual Activity  . Alcohol use: No    Alcohol/week: 0.0 standard drinks  . Drug use: No  . Sexual activity: Yes  Lifestyle  . Physical activity:    Days per week: Not on file    Minutes per session: Not on file  . Stress: Not on file  Relationships  . Social connections:    Talks on phone: Not on file    Gets together: Not on  file    Attends religious service: Not on file    Active member of club or organization: Not on file    Attends meetings of clubs or organizations: Not on file    Relationship status: Not on file  . Intimate partner violence:    Fear of current or ex partner: Not on file    Emotionally abused: Not on file    Physically abused: Not on file    Forced sexual activity: Not on file  Other Topics Concern  . Not on  file  Social History Narrative  . Not on file   Review of Systems  Constitutional: Negative for fatigue and unexpected weight change.  Eyes: Negative for visual disturbance.  Respiratory: Negative for cough, chest tightness and shortness of breath.   Cardiovascular: Negative for chest pain, palpitations and leg swelling.  Gastrointestinal: Negative for abdominal pain, blood in stool, nausea and vomiting.  Genitourinary: Negative for frequency.  Neurological: Negative for dizziness, light-headedness and headaches.  Psychiatric/Behavioral: Negative for dysphoric mood.      Objective:   Physical Exam  Constitutional: He is oriented to person, place, and time. He appears well-developed and well-nourished.  HENT:  Head: Normocephalic and atraumatic.  Eyes: Pupils are equal, round, and reactive to light. EOM are normal.  Neck: No JVD present. Carotid bruit is not present.  Cardiovascular: Normal rate, regular rhythm and normal heart sounds.  No murmur heard. Pulmonary/Chest: Effort normal and breath sounds normal. He has no rales.  Musculoskeletal: He exhibits no edema.  Neurological: He is alert and oriented to person, place, and time.  Skin: Skin is warm and dry.  Psychiatric: He has a normal mood and affect.  Vitals reviewed.  Vitals:   05/27/18 0928 05/27/18 0935  BP: (!) 158/70 (!) 146/60  Pulse: 98   Temp: (!) 97.5 F (36.4 C)   TempSrc: Oral   SpO2: 95%   Weight: 166 lb 9.6 oz (75.6 kg)   Height: '5\' 7"'$  (1.702 m)    Results for orders placed or performed in  visit on 05/27/18  POCT glucose (manual entry)  Result Value Ref Range   POC Glucose 307 (A) 70 - 99 mg/dl  POCT glycosylated hemoglobin (Hb A1C)  Result Value Ref Range   Hemoglobin A1C 8.9 (A) 4.0 - 5.6 %   HbA1c POC (<> result, manual entry)     HbA1c, POC (prediabetic range)     HbA1c, POC (controlled diabetic range)       Assessment & Plan:    NASRI BOAKYE is a 72 y.o. male Type 2 diabetes mellitus with hyperglycemia, without long-term current use of insulin ([place]) - Plan: POCT glucose (manual entry), POCT glycosylated hemoglobin (Hb A1C), Insulin Glargine (LANTUS SOLOSTAR) 100 UNIT/ML Solostar Pen  -Uncontrolled diabetes, suspect component of chemotherapy/treatment.  Given current readings, oral therapy likely will not be sufficient.  -Continue metformin, continue Januvia, but stop glipizide  -Start Lantus 8 units at bedtime initially with increased dosing 2 units every 2 to 3 days until blood sugars are below 200.  Recheck in 2 weeks.  Handout given on use of injectable insulin, but advised to return to clinic with that medication if teaching needed.  Meds ordered this encounter  Medications  . Insulin Glargine (LANTUS SOLOSTAR) 100 UNIT/ML Solostar Pen    Sig: Inject 8 Units into the skin at bedtime.    Dispense:  5 pen    Refill:  PRN   Patient Instructions   Stop glipizide. Start lantus 8 units at bedtime for now, then if blood sugars are over 200, can increase by 2 units every 3 days until blood sugars get below 200. Recheck in next 2 weeks. Return to the clinic or go to the nearest emergency room if any of your symptoms worsen or new symptoms occur.   Insulin Injection Instructions, Using Insulin Pens, Adult A subcutaneous injection is a shot of medicine that is injected into the layer of fat between skin and muscle. People with type 1 diabetes must take insulin because their  bodies do not make it. People with type 2 diabetes may need to take insulin. There are many  different types of insulin. The type of insulin that you take may determine how many injections you give yourself and when you need to take the injections. Choosing a site for injection Insulin absorption varies from site to site. It is best to inject insulin within the same body area, using a different spot in that area for each injection. Do not inject the insulin in the same spot for each injection. There are five main areas that can be used for injecting. These areas include:  Abdomen. This is the preferred area.  Front of thigh.  Upper, outer side of thigh.  Back of upper arm.  Buttocks.  Using an insulin pen First, follow the steps for Getting Ready, then continue with the steps for Injecting the Insulin. Getting Ready 1. Wash your hands with soap and water. If soap and water are not available, use hand sanitizer. 2. Check the expiration date and type of insulin in the pen. 3. If you are using CLEAR insulin, check to see that it is clear and free of clumps. 4. If you are using CLOUDY insulin, gently roll the pen between your palms several times, or tip the pen up and down several times to mix up the medicine. Do not shake the pen. 5. Remove the cap from the insulin pen. 6. Use an alcohol wipe to clean the rubber stopper of the pen cartridge. 7. Remove the protective paper tab from the disposable needle. Do not let the needle touch anything. 8. Screw the needle onto the pen. 9. Remove the outer and inner plastic covers from the needle. Do not throw away the outer plastic cover yet. 10. Prime the insulin pen by turning the button (dial) to 2 units. Hold the pen with the needle pointing up, and push the button on the opposite end of the pen until a drop of insulin appears at the needle tip. If no insulin appears, repeat this step. 11. Dial the number of units of insulin that you will be injecting. Injecting the Insulin  1. Use an alcohol wipe to clean the site where you will be  injecting the needle. Let the site air-dry. 2. Hold the pen in the palm of your writing hand with your thumb on the top. 3. If directed by your health care provider, use your other hand to pinch and hold about an inch of skin at the injection site. Do not directly touch the cleaned part of the skin. 4. Gently but quickly, put the needle straight into the skin. The needle should be at a 90-degree angle (perpendicular) to the skin, as if to form the letter "L." ? For example, if you are giving an injection in the abdomen, the abdomen forms one "leg" of the "L" and the needle forms the other "leg" of the "L." 5. For adults who have a small amount of body fat, the needle may need to be injected at a 45-degree angle instead. Your health care provider will tell you if this is necessary. ? A 45-degree angle looks like the letter "V." 6. When the needle is completely inserted into the skin, use the thumb of your writing hand to push the top button of the pen down all the way to inject the insulin. 7. Let go of the skin that you are pinching. Continue to hold the pen in place with your writing hand. 8. Wait five  seconds, then pull the needle straight out of the skin. 9. Carefully put the larger (outer) plastic cover of the needle back over the needle, then unscrew the capped needle and discard it in a sharps container, such as an empty plastic bottle with a cover. 10. Put the plastic cap back on the insulin pen. Throwing away supplies  Discard all used needles in a puncture-proof sharps disposal container. You can ask your local pharmacy about where you can get this kind of disposal container, or you can use an empty liquid laundry detergent bottle that has a cover.  Follow the disposal regulations for the area where you live. Do not use any needle more than one time.  Throw away empty disposable pens in the regular trash. What questions should I ask my health care provider?  How often should I be taking  insulin?  How often should I check my blood glucose?  What amount of insulin should I be taking at each time?  What are the side effects?  What should I do if my blood glucose is too high?  What should I do if my blood glucose is too low?  What should I do if I forget to take my insulin?  What number should I call if I have questions? Where can I get more information?  American Diabetes Association (ADA): www.diabetes.org  American Association of Diabetes Educators (AADE) Patient Resources: https://www.diabeteseducator.org/patient-resources This information is not intended to replace advice given to you by your health care provider. Make sure you discuss any questions you have with your health care provider. Document Released: 08/24/2015 Document Revised: 12/27/2015 Document Reviewed: 08/24/2015 Elsevier Interactive Patient Education  [employer].   If you have lab work done today you will be contacted with your lab results within the next 2 weeks.  If you have not heard from [place] then please contact us. The fastest way to get your results is to register for My Chart.   IF you received an x-ray today, you will receive an invoice from [facility]. Please contact [facility] at [phone] with questions or concerns regarding your invoice.   IF you received labwork today, you will receive an invoice from [facility]. Please contact [facility] at [phone] with questions or concerns regarding your invoice.   Our billing staff will not be able to assist you with questions regarding bills from these companies.  You will be contacted with the lab results as soon as they are available. The fastest way to get your results is to activate your My Chart account. Instructions are located on the last page of this paperwork. If you have not heard from [place] regarding the results in 2 weeks, please contact this office.       I personally performed the services described in  this documentation, which was scribed in my presence. The recorded information has been reviewed and considered for accuracy and completeness, addended by me as needed, and agree with information above.  Signed,   [name], MD [facility].  05/27/18 10:39 AM

## 2018-05-27 NOTE — Patient Instructions (Addendum)
Stop glipizide. Start lantus 8 units at bedtime for now, then if blood sugars are over 200, can increase by 2 units every 3 days until blood sugars get below 200. Recheck in next 2 weeks. Return to the clinic or go to the nearest emergency room if any of your symptoms worsen or new symptoms occur.   Insulin Injection Instructions, Using Insulin Pens, Adult A subcutaneous injection is a shot of medicine that is injected into the layer of fat between skin and muscle. People with type 1 diabetes must take insulin because their bodies do not make it. People with type 2 diabetes may need to take insulin. There are many different types of insulin. The type of insulin that you take may determine how many injections you give yourself and when you need to take the injections. Choosing a site for injection Insulin absorption varies from site to site. It is best to inject insulin within the same body area, using a different spot in that area for each injection. Do not inject the insulin in the same spot for each injection. There are five main areas that can be used for injecting. These areas include:  Abdomen. This is the preferred area.  Front of thigh.  Upper, outer side of thigh.  Back of upper arm.  Buttocks.  Using an insulin pen First, follow the steps for Getting Ready, then continue with the steps for Injecting the Insulin. Getting Ready 1. Wash your hands with soap and water. If soap and water are not available, use hand sanitizer. 2. Check the expiration date and type of insulin in the pen. 3. If you are using CLEAR insulin, check to see that it is clear and free of clumps. 4. If you are using CLOUDY insulin, gently roll the pen between your palms several times, or tip the pen up and down several times to mix up the medicine. Do not shake the pen. 5. Remove the cap from the insulin pen. 6. Use an alcohol wipe to clean the rubber stopper of the pen cartridge. 7. Remove the protective paper  tab from the disposable needle. Do not let the needle touch anything. 8. Screw the needle onto the pen. 9. Remove the outer and inner plastic covers from the needle. Do not throw away the outer plastic cover yet. 10. Prime the insulin pen by turning the button (dial) to 2 units. Hold the pen with the needle pointing up, and push the button on the opposite end of the pen until a drop of insulin appears at the needle tip. If no insulin appears, repeat this step. 11. Dial the number of units of insulin that you will be injecting. Injecting the Insulin  1. Use an alcohol wipe to clean the site where you will be injecting the needle. Let the site air-dry. 2. Hold the pen in the palm of your writing hand with your thumb on the top. 3. If directed by your health care provider, use your other hand to pinch and hold about an inch of skin at the injection site. Do not directly touch the cleaned part of the skin. 4. Gently but quickly, put the needle straight into the skin. The needle should be at a 90-degree angle (perpendicular) to the skin, as if to form the letter "L." ? For example, if you are giving an injection in the abdomen, the abdomen forms one "leg" of the "L" and the needle forms the other "leg" of the "L." 5. For adults who have  a small amount of body fat, the needle may need to be injected at a 45-degree angle instead. Your health care provider will tell you if this is necessary. ? A 45-degree angle looks like the letter "V." 6. When the needle is completely inserted into the skin, use the thumb of your writing hand to push the top button of the pen down all the way to inject the insulin. 7. Let go of the skin that you are pinching. Continue to hold the pen in place with your writing hand. 8. Wait five seconds, then pull the needle straight out of the skin. 9. Carefully put the larger (outer) plastic cover of the needle back over the needle, then unscrew the capped needle and discard it in a  sharps container, such as an empty plastic bottle with a cover. 10. Put the plastic cap back on the insulin pen. Throwing away supplies  Discard all used needles in a puncture-proof sharps disposal container. You can ask your local pharmacy about where you can get this kind of disposal container, or you can use an empty liquid laundry detergent bottle that has a cover.  Follow the disposal regulations for the area where you live. Do not use any needle more than one time.  Throw away empty disposable pens in the regular trash. What questions should I ask my health care provider?  How often should I be taking insulin?  How often should I check my blood glucose?  What amount of insulin should I be taking at each time?  What are the side effects?  What should I do if my blood glucose is too high?  What should I do if my blood glucose is too low?  What should I do if I forget to take my insulin?  What number should I call if I have questions? Where can I get more information?  American Diabetes Association (ADA): www.diabetes.org  American Association of Diabetes Educators (AADE) Patient Resources: https://www.diabeteseducator.org/patient-resources This information is not intended to replace advice given to you by your health care provider. Make sure you discuss any questions you have with your health care provider. Document Released: 08/24/2015 Document Revised: 12/27/2015 Document Reviewed: 08/24/2015 Elsevier Interactive Patient Education  [employer].   If you have lab work done today you will be contacted with your lab results within the next 2 weeks.  If you have not heard from [place] then please contact us. The fastest way to get your results is to register for My Chart.   IF you received an x-ray today, you will receive an invoice from [facility]. Please contact [facility] at [phone] with questions or concerns regarding your invoice.   IF you  received labwork today, you will receive an invoice from [facility]. Please contact [facility] at [phone] with questions or concerns regarding your invoice.   Our billing staff will not be able to assist you with questions regarding bills from these companies.  You will be contacted with the lab results as soon as they are available. The fastest way to get your results is to activate your My Chart account. Instructions are located on the last page of this paperwork. If you have not heard from [place] regarding the results in 2 weeks, please contact this office.

## 2018-05-27 NOTE — Addendum Note (Signed)
Addended by: Kittie Plater, Vivika Poythress HUA on: 05/27/2018 03:47 PM   Modules accepted: Orders

## 2018-05-27 NOTE — Addendum Note (Signed)
Addended by: [name] R on: 05/27/2018 05:20 PM   Modules accepted: Orders

## 2018-05-27 NOTE — Progress Notes (Signed)
Taught pt how to inject himself with insulin.

## 2018-05-31 ENCOUNTER — Other Ambulatory Visit: Payer: Medicare Other

## 2018-05-31 ENCOUNTER — Ambulatory Visit: Payer: Medicare Other

## 2018-05-31 ENCOUNTER — Ambulatory Visit: Payer: Medicare Other | Admitting: Nurse Practitioner

## 2018-05-31 ENCOUNTER — Encounter: Payer: Medicare Other | Admitting: Nutrition

## 2018-06-07 ENCOUNTER — Other Ambulatory Visit: Payer: Medicare Other

## 2018-06-07 ENCOUNTER — Inpatient Hospital Stay: Payer: Medicare Other

## 2018-06-07 ENCOUNTER — Ambulatory Visit: Payer: Medicare Other

## 2018-06-07 ENCOUNTER — Inpatient Hospital Stay (HOSPITAL_BASED_OUTPATIENT_CLINIC_OR_DEPARTMENT_OTHER): Payer: Medicare Other | Admitting: Nurse Practitioner

## 2018-06-07 ENCOUNTER — Encounter: Payer: Medicare Other | Admitting: Nutrition

## 2018-06-07 ENCOUNTER — Inpatient Hospital Stay: Payer: Medicare Other | Attending: Hematology

## 2018-06-07 ENCOUNTER — Telehealth: Payer: Self-pay | Admitting: Hematology

## 2018-06-07 ENCOUNTER — Ambulatory Visit: Payer: Medicare Other | Admitting: Nurse Practitioner

## 2018-06-07 ENCOUNTER — Encounter: Payer: Self-pay | Admitting: Nurse Practitioner

## 2018-06-07 VITALS — BP 140/72 | HR 66 | Temp 97.7°F | Resp 17 | Ht 67.0 in | Wt 172.0 lb

## 2018-06-07 DIAGNOSIS — Z23 Encounter for immunization: Secondary | ICD-10-CM

## 2018-06-07 DIAGNOSIS — C180 Malignant neoplasm of cecum: Secondary | ICD-10-CM

## 2018-06-07 DIAGNOSIS — D701 Agranulocytosis secondary to cancer chemotherapy: Secondary | ICD-10-CM | POA: Diagnosis not present

## 2018-06-07 DIAGNOSIS — Z5111 Encounter for antineoplastic chemotherapy: Secondary | ICD-10-CM | POA: Diagnosis present

## 2018-06-07 DIAGNOSIS — D509 Iron deficiency anemia, unspecified: Secondary | ICD-10-CM

## 2018-06-07 DIAGNOSIS — E119 Type 2 diabetes mellitus without complications: Secondary | ICD-10-CM | POA: Insufficient documentation

## 2018-06-07 DIAGNOSIS — Z5189 Encounter for other specified aftercare: Secondary | ICD-10-CM | POA: Diagnosis not present

## 2018-06-07 DIAGNOSIS — Z452 Encounter for adjustment and management of vascular access device: Secondary | ICD-10-CM | POA: Insufficient documentation

## 2018-06-07 DIAGNOSIS — R5383 Other fatigue: Secondary | ICD-10-CM | POA: Insufficient documentation

## 2018-06-07 LAB — CBC WITH DIFFERENTIAL (CANCER CENTER ONLY)
Abs Immature Granulocytes: 0.03 10*3/uL (ref 0.00–0.07)
BASOS ABS: 0.1 10*3/uL (ref 0.0–0.1)
BASOS PCT: 3 %
EOS ABS: 0.2 10*3/uL (ref 0.0–0.5)
EOS PCT: 4 %
HCT: 40.3 % (ref 39.0–52.0)
Hemoglobin: 13.3 g/dL (ref 13.0–17.0)
Immature Granulocytes: 1 %
Lymphocytes Relative: 28 %
Lymphs Abs: 1.3 10*3/uL (ref 0.7–4.0)
MCH: 28.2 pg (ref 26.0–34.0)
MCHC: 33 g/dL (ref 30.0–36.0)
MCV: 85.6 fL (ref 80.0–100.0)
Monocytes Absolute: 0.6 10*3/uL (ref 0.1–1.0)
Monocytes Relative: 13 %
NRBC: 0 % (ref 0.0–0.2)
Neutro Abs: 2.4 10*3/uL (ref 1.7–7.7)
Neutrophils Relative %: 51 %
PLATELETS: 203 10*3/uL (ref 150–400)
RBC: 4.71 MIL/uL (ref 4.22–5.81)
RDW: 22.4 % — AB (ref 11.5–15.5)
WBC: 4.7 10*3/uL (ref 4.0–10.5)

## 2018-06-07 LAB — CMP (CANCER CENTER ONLY)
ALK PHOS: 146 U/L — AB (ref 38–126)
ALT: 25 U/L (ref 0–44)
ANION GAP: 7 (ref 5–15)
AST: 25 U/L (ref 15–41)
Albumin: 3.5 g/dL (ref 3.5–5.0)
BILIRUBIN TOTAL: 1.5 mg/dL — AB (ref 0.3–1.2)
BUN: 16 mg/dL (ref 8–23)
CALCIUM: 8.9 mg/dL (ref 8.9–10.3)
CO2: 25 mmol/L (ref 22–32)
Chloride: 106 mmol/L (ref 98–111)
Creatinine: 0.75 mg/dL (ref 0.61–1.24)
Glucose, Bld: 239 mg/dL — ABNORMAL HIGH (ref 70–99)
Potassium: 4.1 mmol/L (ref 3.5–5.1)
SODIUM: 138 mmol/L (ref 135–145)
TOTAL PROTEIN: 6.6 g/dL (ref 6.5–8.1)

## 2018-06-07 MED ORDER — SODIUM CHLORIDE 0.9 % IV SOLN
Freq: Once | INTRAVENOUS | Status: AC
  Administered 2018-06-07: 11:00:00 via INTRAVENOUS
  Filled 2018-06-07: qty 5

## 2018-06-07 MED ORDER — SODIUM CHLORIDE 0.9% FLUSH
10.0000 mL | Freq: Once | INTRAVENOUS | Status: AC | PRN
  Administered 2018-06-07: 10 mL
  Filled 2018-06-07: qty 10

## 2018-06-07 MED ORDER — SODIUM CHLORIDE 0.9% FLUSH
3.0000 mL | Freq: Once | INTRAVENOUS | Status: DC | PRN
  Filled 2018-06-07: qty 10

## 2018-06-07 MED ORDER — DEXTROSE 5 % IV SOLN
Freq: Once | INTRAVENOUS | Status: AC
  Administered 2018-06-07: 10:00:00 via INTRAVENOUS
  Filled 2018-06-07: qty 250

## 2018-06-07 MED ORDER — SODIUM CHLORIDE 0.9 % IV SOLN
2400.0000 mg/m2 | INTRAVENOUS | Status: DC
  Administered 2018-06-07: 4600 mg via INTRAVENOUS
  Filled 2018-06-07: qty 92

## 2018-06-07 MED ORDER — INFLUENZA VAC SPLIT HIGH-DOSE 0.5 ML IM SUSY
0.5000 mL | PREFILLED_SYRINGE | Freq: Once | INTRAMUSCULAR | Status: AC
  Administered 2018-06-07: 0.5 mL via INTRAMUSCULAR
  Filled 2018-06-07: qty 0.5

## 2018-06-07 MED ORDER — OXALIPLATIN CHEMO INJECTION 100 MG/20ML
75.0000 mg/m2 | Freq: Once | INTRAVENOUS | Status: AC
  Administered 2018-06-07: 145 mg via INTRAVENOUS
  Filled 2018-06-07: qty 9

## 2018-06-07 MED ORDER — PALONOSETRON HCL INJECTION 0.25 MG/5ML
0.2500 mg | Freq: Once | INTRAVENOUS | Status: AC
  Administered 2018-06-07: 0.25 mg via INTRAVENOUS

## 2018-06-07 MED ORDER — DEXTROSE 5 % IV SOLN
INTRAVENOUS | Status: DC
  Administered 2018-06-07: 10:00:00 via INTRAVENOUS
  Filled 2018-06-07: qty 250

## 2018-06-07 MED ORDER — LEUCOVORIN CALCIUM INJECTION 350 MG
400.0000 mg/m2 | Freq: Once | INTRAVENOUS | Status: AC
  Administered 2018-06-07: 768 mg via INTRAVENOUS
  Filled 2018-06-07: qty 38.4

## 2018-06-07 MED ORDER — PALONOSETRON HCL INJECTION 0.25 MG/5ML
INTRAVENOUS | Status: AC
  Filled 2018-06-07: qty 5

## 2018-06-07 NOTE — Patient Instructions (Addendum)
[facility] Discharge Instructions for Patients Receiving Chemotherapy  Today you received the following chemotherapy agents: Oxaliplatin (Eloxatin), Leucovorin, and Fluorouracil (Adrucil, 5-FU)  To help prevent nausea and vomiting after your treatment, we encourage you to take your nausea medication as directed. Received Aloxi during treatment today-->Take Compazine (not Zofran) for the next 3 days as needed.  If you develop nausea and vomiting that is not controlled by your nausea medication, call the clinic.   BELOW ARE SYMPTOMS THAT SHOULD BE REPORTED IMMEDIATELY:  *FEVER GREATER THAN 100.5 F  *CHILLS WITH OR WITHOUT FEVER  NAUSEA AND VOMITING THAT IS NOT CONTROLLED WITH YOUR NAUSEA MEDICATION  *UNUSUAL SHORTNESS OF BREATH  *UNUSUAL BRUISING OR BLEEDING  TENDERNESS IN MOUTH AND THROAT WITH OR WITHOUT PRESENCE OF ULCERS  *URINARY PROBLEMS  *BOWEL PROBLEMS  UNUSUAL RASH Items with * indicate a potential emergency and should be followed up as soon as possible.  Feel free to call the clinic should you have any questions or concerns. The clinic phone number is [phone].  Please show the [place] at check-in to the Emergency Department and triage nurse.  Influenza (Flu) Vaccine (Inactivated or Recombinant): What You Need to Know 1. Why get vaccinated? Influenza ("flu") is a contagious disease that spreads around the [place] every year, usually between October and May. Flu is caused by influenza viruses, and is spread mainly by coughing, sneezing, and close contact. Anyone can get flu. Flu strikes suddenly and can last several days. Symptoms vary by age, but can include:  fever/chills  sore throat  muscle aches  fatigue  cough  headache  runny or stuffy nose  Flu can also lead to pneumonia and blood infections, and cause diarrhea and seizures in children. If you have a medical condition, such as heart or lung disease, flu can make it  worse. Flu is more dangerous for some people. Infants and young children, people 68 years of age and older, pregnant women, and people with certain health conditions or a weakened immune system are at greatest risk. Each year thousands of people in the [place] die from flu, and many more are hospitalized. Flu vaccine can:  keep you from getting flu,  make flu less severe if you do get it, and  keep you from spreading flu to your family and other people. 2. Inactivated and recombinant flu vaccines A dose of flu vaccine is recommended every flu season. Children 6 months through 40 years of age may need two doses during the same flu season. Everyone else needs only one dose each flu season. Some inactivated flu vaccines contain a very small amount of a mercury-based preservative called thimerosal. Studies have not shown thimerosal in vaccines to be harmful, but flu vaccines that do not contain thimerosal are available. There is no live flu virus in flu shots. They cannot cause the flu. There are many flu viruses, and they are always changing. Each year a new flu vaccine is made to protect against three or four viruses that are likely to cause disease in the upcoming flu season. But even when the vaccine doesn't exactly match these viruses, it may still provide some protection. Flu vaccine cannot prevent:  flu that is caused by a virus not covered by the vaccine, or  illnesses that look like flu but are not.  It takes about 2 weeks for protection to develop after vaccination, and protection lasts through the flu season. 3. Some people should not get this  vaccine Tell the person who is giving you the vaccine:  If you have any severe, life-threatening allergies. If you ever had a life-threatening allergic reaction after a dose of flu vaccine, or have a severe allergy to any part of this vaccine, you may be advised not to get vaccinated. Most, but not all, types of flu vaccine contain a small  amount of egg protein.  If you ever had Guillain-Barr Syndrome (also called GBS). Some people with a history of GBS should not get this vaccine. This should be discussed with your doctor.  If you are not feeling well. It is usually okay to get flu vaccine when you have a mild illness, but you might be asked to come back when you feel better.  4. Risks of a vaccine reaction With any medicine, including vaccines, there is a chance of reactions. These are usually mild and go away on their own, but serious reactions are also possible. Most people who get a flu shot do not have any problems with it. Minor problems following a flu shot include:  soreness, redness, or swelling where the shot was given  hoarseness  sore, red or itchy eyes  cough  fever  aches  headache  itching  fatigue  If these problems occur, they usually begin soon after the shot and last 1 or 2 days. More serious problems following a flu shot can include the following:  There may be a small increased risk of Guillain-Barre Syndrome (GBS) after inactivated flu vaccine. This risk has been estimated at 1 or 2 additional cases per million people vaccinated. This is much lower than the risk of severe complications from flu, which can be prevented by flu vaccine.  Young children who get the flu shot along with pneumococcal vaccine (PCV13) and/or DTaP vaccine at the same time might be slightly more likely to have a seizure caused by fever. Ask your doctor for more information. Tell your doctor if a child who is getting flu vaccine has ever had a seizure.  Problems that could happen after any injected vaccine:  People sometimes faint after a medical procedure, including vaccination. Sitting or lying down for about 15 minutes can help prevent fainting, and injuries caused by a fall. Tell your doctor if you feel dizzy, or have vision changes or ringing in the ears.  Some people get severe pain in the shoulder and have  difficulty moving the arm where a shot was given. This happens very rarely.  Any medication can cause a severe allergic reaction. Such reactions from a vaccine are very rare, estimated at about 1 in a million doses, and would happen within a few minutes to a few hours after the vaccination. As with any medicine, there is a very remote chance of a vaccine causing a serious injury or death. The safety of vaccines is always being monitored. For more information, visit: http://www.aguilar.org/ 5. What if there is a serious reaction? What should I look for? Look for anything that concerns you, such as signs of a severe allergic reaction, very high fever, or unusual behavior. Signs of a severe allergic reaction can include hives, swelling of the face and throat, difficulty breathing, a fast heartbeat, dizziness, and weakness. These would start a few minutes to a few hours after the vaccination. What should I do?  If you think it is a severe allergic reaction or other emergency that can't wait, call 9-1-1 and get the person to the nearest hospital. Otherwise, call your doctor.  Reactions should be reported to the Vaccine Adverse Event Reporting System (VAERS). Your doctor should file this report, or you can do it yourself through the VAERS web site at www.vaers.SamedayNews.es, or by calling (506)380-2982. ? VAERS does not give medical advice. 6. The National Vaccine Injury Compensation Program The [employer] Vaccine Injury Compensation Program (VICP) is a federal program that was created to compensate people who may have been injured by certain vaccines. Persons who believe they may have been injured by a vaccine can learn about the program and about filing a claim by calling 678-455-9218 or visiting the [place] website at GoldCloset.com.ee. There is a time limit to file a claim for compensation. 7. How can I learn more?  Ask your healthcare provider. He or she can give you the vaccine package  insert or suggest other sources of information.  Call your local or state health department.  Contact the Centers for Disease Control and Prevention (CDC): ? Call 505-562-7387 (1-800-CDC-INFO) or ? Visit CDC's website at https://gibson.com/ Vaccine Information Statement, Inactivated Influenza Vaccine (03/10/2014) This information is not intended to replace advice given to you by your health care provider. Make sure you discuss any questions you have with your health care provider. Document Released: 05/15/2006 Document Revised: 04/10/2016 Document Reviewed: 04/10/2016 Elsevier Interactive Patient Education  2017 [employer].

## 2018-06-07 NOTE — Progress Notes (Signed)
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 Benson Oneill 12/17/17  IMPRESSION -An infiltrative sessile and ulcerated non obstructing large mass in the cecum. This was noted to be in the cecal cap and was partially circumferential and 2cm in length. It was not noted to be bleeding.  -There was a 2 sessile polyps that were very close that were 4 to 26m that were not biopsies.  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  -The cecal mass was a poorly differentiated invasive adenocarcinoma.  [facility] [place] [place]  Accession number is [id number]    12/17/2017 Imaging    CT CAP W Contrast 12/17/17  IMPRESSION: 1. Focal area of abnormal enhancement involving the ascending colon may represent mass discovered on recent colonoscopy. 2. No specific findings identified to suggest metastatic disease. 3. 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 Nonspecific. 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 4. Nonspecific 3 mm  right upper lobe lung nodule is noted. No follow-up needed if patient is low-risk. Non-contrast chest CT can be considered in 12 months if patient is high-risk. This recommendation follows the consensus statement: Guidelines for Management of Incidental Pulmonary Nodules Detected on CT Images: From the Fleischner Society 2017; Radiology 2017; 284:228-243. 5. 7 mm low-density structure in segment 8 of the liver is too small to characterize. 6.  Aortic Atherosclerosis (ICD10-I70.0). 7. Gallstones.    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 BBarry Dieneson 02/02/18      02/02/2018 Pathology Results    Diagnosis 02/02/18  Colon, segmental resection for tumor, right - INVASIVE COLORECTAL ADENOCARCINOMA, TWO TUMORS, 5.5 AND 1.8 CM. - LARGER, 5.5 CM TUMOR INVOLVES VISCERAL PERITONEUM - SMALLER, 1.8 CM TUMOR INVOLVES SUBMUCOSA. - ONE EXTRAMURAL SATELLITE TUMOR NODULE. - METASTATIC CARCINOMA IN ONE OF TWENTY-NINE LYMPH NODES (1/29). - TWO TUBULAR ADENOMAS, 0.6 AND 0.8 CM. - BENIGN APPENDIX WITH FIBROSIS OF THE LUMEN.    02/02/2018 Cancer Staging    Staging form: Colon and Rectum, AJCC 8th Edition - Pathologic stage from 02/02/2018: Stage IIIB (pT4a, pN1a, cM0) - Signed by William Merle MD on 02/20/2018    03/08/2018 -  Chemotherapy    Adjuvnat chemo FOLFOX every 2 weeks for 3-6 months starting 03/08/18. Udynaca added at cycle 3 due to neutropenia.    CURRENT THERAPYAdjuvant FOLFOX q2 weeks, plan for 3-6 months;began8/5   INTERVAL HISTORY: [name] for follow up and cycle 6 adjuvant FOLFOX. He completed cycle 5 on 05/17/18. He requested an additional week  off to recover from fatigue and n/v with cycle 5. He has felt well with the additional week off. He has eaten better and gained weight. He saw PCP 10/24 who adjusted DM meds, he continues metformin and januvia, stopped glipizide and  started lantus at bedtime. BG has fluctuated. 172, >200, and occasionally over 300. He will f/u with PCP 11/8. Otherwise he has no specific complaints today. Denies n/v/c/d, bleeding, abdominal pain, neuropathy, mucositis, fever, chills, cough, chest pain, dyspnea,    MEDICAL HISTORY:  Past Medical History:  Diagnosis Date  . Anemia   . Cancer ([place])   . Colon cancer ([facility])   . Diabetes mellitus without complication ([place])    type 2  . High cholesterol   . History of kidney stones   . Insomnia   . Pneumonia    x1    SURGICAL HISTORY: Past Surgical History:  Procedure Laterality Date  . COLON SURGERY     laparoscopic ascending colectomy  Dr. Barry Oneill 02-02-18  . COLONOSCOPY    . LAPAROSCOPIC PARTIAL COLECTOMY N/A 02/02/2018   Procedure: LAPAROSCOPIC ASCENDING COLECTOMY;  Surgeon: William Klein, MD;  Location: [facility];  Service: General;  Laterality: N/A;  . NO PAST SURGERIES    . PORTACATH PLACEMENT Left 03/03/2018   Procedure: INSERTION PORT-A-CATH;  Surgeon: William Klein, MD;  Location: [facility];  Service: General;  Laterality: Left;  . TONSILLECTOMY     as a child    I have reviewed the social history and family history with the patient and they are unchanged from previous note.  ALLERGIES:  has No Known Allergies.  MEDICATIONS:  Current Outpatient Medications  Medication Sig Dispense Refill  . acetaminophen (TYLENOL) 325 MG tablet Take 2 tablets (650 mg total) by mouth every 6 (six) hours as needed.    Marland Kitchen atorvastatin (LIPITOR) 10 MG tablet Take 1 tablet (10 mg total) by mouth daily. 90 tablet 1  . blood glucose meter kit and supplies Dispense based on patient and insurance preference. Use up to four times daily as directed. (FOR ICD-9 250.00, 250.01). 1 each 0  . ferrous sulfate 325 (65 FE) MG EC tablet Take 1 tablet (325 mg total) by mouth 3 (three) times daily with meals. 30 tablet 2  . glucose blood (ONE TOUCH ULTRA TEST) test strip USE AS DIRECTED TO TEST UP  TO 4 TIMES A DAY 100 each 6  . hydrOXYzine (ATARAX/VISTARIL) 25 MG tablet Take 0.5-1 tablets (12.5-25 mg total) by mouth at bedtime as needed (sleep). 30 tablet 0  . ibuprofen (ADVIL,MOTRIN) 600 MG tablet Take 1 tablet (600 mg total) by mouth every 6 (six) hours as needed (pain not controlled with tylenol). 30 tablet 0  . Insulin Glargine (LANTUS SOLOSTAR) 100 UNIT/ML Solostar Pen Inject 8 Units into the skin at bedtime. 5 pen PRN  . Insulin Pen Needle (PEN NEEDLES) 32G X 4 MM MISC 1 application by Does not apply route daily. 90 each 3  . Lancets (ACCU-CHEK SOFT TOUCH) lancets Use as instructed 100 each 12  . lidocaine-prilocaine (EMLA) cream Apply to affected area once 30 g 3  . metFORMIN (GLUCOPHAGE) 1000 MG tablet Take 1 tablet (1,000 mg total) by mouth 2 (two) times daily with a meal. 180 tablet 1  . Multiple Vitamin (MULTIVITAMIN WITH MINERALS) TABS tablet Take 1 tablet by mouth daily.    . ondansetron (ZOFRAN ODT) 4 MG disintegrating tablet Take 1 tablet (4 mg total) by mouth every 8 (eight) hours as needed  for nausea or vomiting. 6 tablet 0  . ondansetron (ZOFRAN) 8 MG tablet Take 1 tablet (8 mg total) by mouth 2 (two) times daily as needed for refractory nausea / vomiting. Start on day 3 after chemotherapy. 30 tablet 1  . prochlorperazine (COMPAZINE) 10 MG tablet Take 1 tablet (10 mg total) by mouth every 6 (six) hours as needed (Nausea or vomiting). 30 tablet 1  . sitaGLIPtin (JANUVIA) 100 MG tablet Take 1 tablet (100 mg total) by mouth daily. 90 tablet 1   No current facility-administered medications for this visit.    Facility-Administered Medications Ordered in Other Visits  Medication Dose Route Frequency Provider Last Rate Last Dose  . dextrose 5 % solution   Intravenous Continuous William Merle, MD 20 mL/hr at 06/07/18 1019    . fluorouracil (ADRUCIL) 4,600 mg in sodium chloride 0.9 % 58 mL chemo infusion  2,400 mg/m2 (Treatment Plan Recorded) Intravenous 1 day or 1 dose William Merle, MD       . fosaprepitant (EMEND) 150 mg, dexamethasone (DECADRON) 10 mg in sodium chloride 0.9 % 145 mL IVPB   Intravenous Once [name], NP      . Influenza vac split quadrivalent PF (FLUZONE HIGH-DOSE) injection 0.5 mL  0.5 mL Intramuscular Once Cira Rue K, NP      . leucovorin 768 mg in dextrose 5 % 250 mL infusion  400 mg/m2 (Treatment Plan Recorded) Intravenous Once William Merle, MD      . oxaliplatin (ELOXATIN) 145 mg in dextrose 5 % 500 mL chemo infusion  75 mg/m2 (Treatment Plan Recorded) Intravenous Once William Merle, MD      . palonosetron (ALOXI) injection 0.25 mg  0.25 mg Intravenous Once William Merle, MD        PHYSICAL EXAMINATION: ECOG PERFORMANCE STATUS: 1 - Symptomatic but completely ambulatory  Vitals:   06/07/18 0943  BP: 140/72  Pulse: 66  Resp: 17  Temp: 97.7 F (36.5 C)  SpO2: 99%   Filed Weights   06/07/18 0943  Weight: 172 lb (78 kg)    GENERAL:alert, no distress and comfortable SKIN: no rashes or significant lesions EYES:  sclera clear OROPHARYNX:no thrush or ulcers  LYMPH:  no palpable cervical or supraclavicular lymphadenopathy LUNGS: clear to auscultation with normal breathing effort HEART: regular rate & rhythm, no lower extremity edema ABDOMEN:abdomen soft, non-tender and normal bowel sounds Musculoskeletal:no cyanosis of digits and no clubbing  NEURO: alert & oriented x 3 with fluent speech, no focal motor/sensory deficits PAC without erythema    LABORATORY DATA:  I have reviewed the data as listed CBC Latest Ref Rng & Units 06/07/2018 05/25/2018 05/17/2018  WBC 4.0 - 10.5 K/uL 4.7 24.5(H) 5.8  Hemoglobin 13.0 - 17.0 g/dL 13.3 12.8(L) 13.2  Hematocrit 39.0 - 52.0 % 40.3 39.3 40.2  Platelets 150 - 400 K/uL 203 180 205     CMP Latest Ref Rng & Units 06/07/2018 05/25/2018 05/17/2018  Glucose 70 - 99 mg/dL 239(H) 303(H) 276(H)  BUN 8 - 23 mg/dL '16 18 21  '$ Creatinine 0.61 - 1.24 mg/dL 0.75 0.96 0.72  Sodium 135 - 145 mmol/L 138 139 141    Potassium 3.5 - 5.1 mmol/L 4.1 4.1 4.1  Chloride 98 - 111 mmol/L 106 104 106  CO2 22 - 32 mmol/L '25 26 28  '$ Calcium 8.9 - 10.3 mg/dL 8.9 9.3 9.2  Total Protein 6.5 - 8.1 g/dL 6.6 6.9 6.6  Total Bilirubin 0.3 - 1.2 mg/dL 1.5(H) 1.1 1.4(H)  Alkaline Phos  38 - 126 U/L 146(H) 285(H) 113  AST 15 - 41 U/L 25 35 36  ALT 0 - 44 U/L 25 28 32      RADIOGRAPHIC STUDIES: I have personally reviewed the radiological images as listed and agreed with the findings in the report. No results found.   ASSESSMENT & PLAN: William Oneill a 72 y.o.Caucasianmalewith a history of DM.  1. Cecal Cancer,adenocarcinoma, grade 2, pT4aN1aM0,stage IIIB, MSS 2. Anemia of iron deficiency, s/p IV Feraheme x2 8/21, 9/10 3. DM- on metformin, glipizide, and [place]; PCP recently increased glipizide to 10 mg BID; 10/24 PCP stopped glipizide and added lantus insulin at bedtime  4. 7 mm liver lesion, seen on staging work up; plan to repeat CT upon completion of chemotherapy to follow the lesion 5. Chemotherapy-induced neutropenia, added Udenyca with cycle 3 6. Severe fatigue, weakness, decreased po intake after cycle 4 requiring ED visit 9/28, secondary to chemotherapy; oxaliplatin dose-reduced with cycle 5; he had fatigue, n/v/ after cycle 5 required treatment delay   Mr. Schreier appears well today. He completed cycle 5 adjuvant FOLFOX with dose-reduced oxaliplatin with cycle 5. He had fatigue and n/v and requested 1 week treatment delay. Today he has recovered well. I recommend to add Emend anti-emetic with cycle 6. He will use po anti-emetics as prescribed. CBC and CMP reviewed. He is not anemic, last iron studies are adequate, will hold additional IV Feraheme for now. BG improved to 239. Alk phos and bili fluctuate on chemotherapy, he is asymptomatic. Overall, labs adequate to proceed. Will monitor closely.   He will get flu vaccine today. He will proceed with cycle 6 chemotherapy, which will mark 3 months of  adjuvant therapy. He agrees to continue. Will see him back in 2 weeks for f/u and next cycle.   PLAN: -Labs reviewed, proceed with cycle 6 adjuvant FOLFOX at current dose -Add emend with cycle 6 -Flu vaccine today -F/u in 2 weeks with next cycle   All questions were answered. The patient knows to call the clinic with any problems, questions or concerns. No barriers to learning was detected.     [name], NP 06/07/18

## 2018-06-07 NOTE — Telephone Encounter (Signed)
No los 11/4

## 2018-06-09 ENCOUNTER — Inpatient Hospital Stay: Payer: Medicare Other

## 2018-06-09 VITALS — BP 155/75 | HR 78 | Temp 98.1°F | Resp 18

## 2018-06-09 DIAGNOSIS — Z5111 Encounter for antineoplastic chemotherapy: Secondary | ICD-10-CM | POA: Diagnosis not present

## 2018-06-09 DIAGNOSIS — C180 Malignant neoplasm of cecum: Secondary | ICD-10-CM

## 2018-06-09 MED ORDER — PEGFILGRASTIM-CBQV 6 MG/0.6ML ~~LOC~~ SOSY
6.0000 mg | PREFILLED_SYRINGE | Freq: Once | SUBCUTANEOUS | Status: AC
  Administered 2018-06-09: 6 mg via SUBCUTANEOUS

## 2018-06-09 MED ORDER — SODIUM CHLORIDE 0.9% FLUSH
10.0000 mL | INTRAVENOUS | Status: DC | PRN
  Administered 2018-06-09: 10 mL
  Filled 2018-06-09: qty 10

## 2018-06-09 MED ORDER — PEGFILGRASTIM-CBQV 6 MG/0.6ML ~~LOC~~ SOSY
PREFILLED_SYRINGE | SUBCUTANEOUS | Status: AC
  Filled 2018-06-09: qty 0.6

## 2018-06-09 MED ORDER — HEPARIN SOD (PORK) LOCK FLUSH 100 UNIT/ML IV SOLN
500.0000 [IU] | Freq: Once | INTRAVENOUS | Status: AC | PRN
  Administered 2018-06-09: 500 [IU]
  Filled 2018-06-09: qty 5

## 2018-06-11 ENCOUNTER — Encounter: Payer: Self-pay | Admitting: Family Medicine

## 2018-06-11 ENCOUNTER — Ambulatory Visit (INDEPENDENT_AMBULATORY_CARE_PROVIDER_SITE_OTHER): Payer: Medicare Other | Admitting: Family Medicine

## 2018-06-11 ENCOUNTER — Other Ambulatory Visit: Payer: Self-pay

## 2018-06-11 VITALS — BP 142/72 | HR 77 | Temp 97.7°F | Ht 67.0 in | Wt 161.2 lb

## 2018-06-11 DIAGNOSIS — E1165 Type 2 diabetes mellitus with hyperglycemia: Secondary | ICD-10-CM

## 2018-06-11 LAB — GLUCOSE, POCT (MANUAL RESULT ENTRY): POC GLUCOSE: 279 mg/dL — AB (ref 70–99)

## 2018-06-11 NOTE — Progress Notes (Signed)
Subjective:  By signing my name below, I, William Oneill, attest that this documentation has been prepared under the direction and in the presence of William Agreste, MD Electronically Signed: Ladene Oneill, ED Scribe 06/11/2018 at 12:23 PM.   Patient ID: William Oneill, male    DOB: 1946/03/17, 72 y.o.   MRN: [id number]   Chief Complaint  Patient presents with  . Diabetes    2 wek f/u   . Handicap placard to complete   HPI William Oneill is a 72 y.o. male who presents to Primary Care at [facility] for f/u. See prior visits. 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 Most recently seen by oncology 4 days ago. On chemo.  DM Lab Results  Component Value Date   HGBA1C 8.9 (A) 05/27/2018  Last seen 10/24. Unfortunately he had persistently elevated readings even with higher dose of glipizide. Continued metformin and januvia. Glucose was 307 in office, thought to be in part due to treatment and chemo. Decided to start insulin with lantus 8 units qhs, plan on increasing every 2-3 days until readings below 200. He was taught how to give injections in office and handout given on technique as well. - Pt has been using 8 units of insulin qhs without any issues. Home blood glucose readings ranging 172-201. Denies symptomatic lows.  Handicap Placard Pt presents with a form for a handicap placard. He reports fatigue with walking long distances that he attributes to chemo.  Patient Active Problem List   Diagnosis Date Noted  . 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 Generalized weakness 04/11/2015  . Acute purulent bronchitis 04/11/2015  . Dehydration 04/11/2015  . Nocturia more than twice per night 01/16/2015  . Snoring 01/16/2015  . 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 Obese abdomen 01/16/2015  . Other fatigue 01/16/2015  . Type 2 diabetes mellitus without complication (William Oneill  . Insomnia    Past Medical History:  Diagnosis  Date  . Anemia   . Cancer ([place])   . Colon cancer ([place])   . Diabetes mellitus without complication ([place])    type 2  . High cholesterol   . History of kidney stones   . Insomnia   . Pneumonia    x1   Past Surgical History:  Procedure Laterality Date  . COLON SURGERY     laparoscopic ascending colectomy  Dr. Barry Dienes 02-02-18  . COLONOSCOPY    . LAPAROSCOPIC PARTIAL COLECTOMY N/A 02/02/2018   Procedure: LAPAROSCOPIC ASCENDING COLECTOMY;  Surgeon: Stark Klein, MD;  Location: [facility];  Service: General;  Laterality: N/A;  . NO PAST SURGERIES    . PORTACATH PLACEMENT Left 03/03/2018   Procedure: INSERTION PORT-A-CATH;  Surgeon: Stark Klein, MD;  Location: [place];  Service: General;  Laterality: Left;  . TONSILLECTOMY     as a child   No Known Allergies Prior to Admission medications   Medication Sig Start Date End Date Taking? Authorizing Provider  acetaminophen (TYLENOL) 325 MG tablet Take 2 tablets (650 mg total) by mouth every 6 (six) hours as needed. 02/06/18  Yes [name], MD  atorvastatin (LIPITOR) 10 MG tablet Take 1 tablet (10 mg total) by mouth daily. 11/12/17  Yes William Agreste, MD  blood glucose meter kit and supplies Dispense based on patient and insurance preference. Use up to four times daily as directed. (FOR ICD-9 250.00, 250.01). 12/11/14  Yes William Agreste,  MD  ferrous sulfate 325 (65 FE) MG EC tablet Take 1 tablet (325 mg total) by mouth 3 (three) times daily with meals. 02/19/18  Yes [name], MD  glucose blood (ONE TOUCH ULTRA TEST) test strip USE AS DIRECTED TO TEST UP TO 4 TIMES A DAY 12/07/17  Yes William Agreste, MD  hydrOXYzine (ATARAX/VISTARIL) 25 MG tablet Take 0.5-1 tablets (12.5-25 mg total) by mouth at bedtime as needed (sleep). 03/01/18  Yes William Agreste, MD  ibuprofen (ADVIL,MOTRIN) 600 MG tablet Take 1 tablet (600 mg total) by mouth every 6 (six) hours as needed (pain not controlled with tylenol). 02/06/18  Yes [name], MD  Insulin Glargine (LANTUS SOLOSTAR) 100 UNIT/ML Solostar Pen Inject 8 Units into the skin at bedtime. 05/27/18  Yes William Agreste, MD  Insulin Pen Needle (PEN NEEDLES) 32G X 4 MM MISC 1 application by Does not apply route daily. 05/27/18  Yes William Agreste, MD  Lancets (ACCU-CHEK SOFT TOUCH) lancets Use as instructed 05/27/18  Yes William Agreste, MD  lidocaine-prilocaine (EMLA) cream Apply to affected area once 02/20/18  Yes [name], MD  metFORMIN (GLUCOPHAGE) 1000 MG tablet Take 1 tablet (1,000 mg total) by mouth 2 (two) times daily with a meal. 03/01/18  Yes William Agreste, MD  Multiple Vitamin (MULTIVITAMIN WITH MINERALS) TABS tablet Take 1 tablet by mouth daily.   Yes [provider]  ondansetron (ZOFRAN ODT) 4 MG disintegrating tablet Take 1 tablet (4 mg total) by mouth every 8 (eight) hours as needed for nausea or vomiting. 05/01/18  Yes [name], PA-C  ondansetron (ZOFRAN) 8 MG tablet Take 1 tablet (8 mg total) by mouth 2 (two) times daily as needed for refractory nausea / vomiting. Start on day 3 after chemotherapy. 02/20/18  Yes [name], MD  prochlorperazine (COMPAZINE) 10 MG tablet Take 1 tablet (10 mg total) by mouth every 6 (six) hours as needed (Nausea or vomiting). 05/25/18  Yes [name], NP  sitaGLIPtin (JANUVIA) 100 MG tablet Take 1 tablet (100 mg total) by mouth daily. 03/01/18  Yes William Agreste, MD   Social History   Socioeconomic History  . Marital status: Single    Spouse name: Not on file  . Number of children: Not on file  . Years of education: Not on file  . Highest education level: Not on file  Occupational History  . Not on file  Social Needs  . Financial resource strain: Not on file  . Food insecurity:    Worry: Not on file    Inability: Not on file  . Transportation needs:    Medical: Not on file    Non-medical: Not on file  Tobacco Use  . Smoking status: Never Smoker  . Smokeless tobacco: Never Used    Substance and Sexual Activity  . Alcohol use: No    Alcohol/week: 0.0 standard drinks  . Drug use: No  . Sexual activity: Yes  Lifestyle  . Physical activity:    Days per week: Not on file    Minutes per session: Not on file  . Stress: Not on file  Relationships  . Social connections:    Talks on phone: Not on file    Gets together: Not on file    Attends religious service: Not on file    Active member of club or organization: Not on file    Attends meetings of clubs or organizations: Not on file  Relationship status: Not on file  . Intimate partner violence:    Fear of current or ex partner: Not on file    Emotionally abused: Not on file    Physically abused: Not on file    Forced sexual activity: Not on file  Other Topics Concern  . Not on file  Social History Narrative  . Not on file   Review of Systems  Constitutional: Positive for fatigue. Negative for unexpected weight change.  Eyes: Negative for visual disturbance.  Respiratory: Negative for cough, chest tightness and shortness of breath.   Cardiovascular: Negative for chest pain, palpitations and leg swelling.  Gastrointestinal: Negative for abdominal pain and blood in stool.  Neurological: Negative for dizziness, light-headedness and headaches.      Objective:   Physical Exam  Constitutional: He is oriented to person, place, and time. He appears well-developed and well-nourished.  HENT:  Head: Normocephalic and atraumatic.  Eyes: Pupils are equal, round, and reactive to light. EOM are normal.  Neck: No JVD present. Carotid bruit is not present.  Cardiovascular: Normal rate, regular rhythm and normal heart sounds.  No murmur heard. Pulmonary/Chest: Effort normal and breath sounds normal. He has no rales.  Musculoskeletal: He exhibits no edema.  Neurological: He is alert and oriented to person, place, and time.  Skin: Skin is warm and dry.  Psychiatric: He has a normal mood and affect.  Vitals  reviewed.  Vitals:   06/11/18 1146  BP: (!) 142/72  Pulse: 77  Temp: 97.7 F (36.5 C)  TempSrc: Oral  SpO2: 93%  Weight: 161 lb 3.2 oz (73.1 kg)  Height: '5\' 7"'$  (1.702 m)   Results for orders placed or performed in visit on 06/11/18  POCT glucose (manual entry)  Result Value Ref Range   POC Glucose 279 (A) 70 - 99 mg/dl      Assessment & Plan:  William Oneill is a 72 y.o. male Type 2 diabetes mellitus with hyperglycemia, without long-term current use of insulin ([place]) - Plan: POCT glucose (manual entry)  -Still uncontrolled.  There was some confusion about increasing Lantus dosing.  We will go ahead and have him increase Lantus to 10 units for now has some home readings in the 100s.  Recheck 1 week.  Handicap placard provided due to difficulty with walking long distances.  Suspect some fatigue due to his cancer treatment.  No orders of the defined types were placed in this encounter.  Patient Instructions     Increase Lantus to 10 units/day for now and recheck with me in 1 week to determine further changes.  No other medication changes for now.  If you have lab work done today you will be contacted with your lab results within the next 2 weeks.  If you have not heard from [place] then please contact us. The fastest way to get your results is to register for My Chart.   IF you received an x-ray today, you will receive an invoice from [facility]. Please contact [facility] at [phone] with questions or concerns regarding your invoice.   IF you received labwork today, you will receive an invoice from [place]. Please contact [facility] at [phone] with questions or concerns regarding your invoice.   Our billing staff will not be able to assist you with questions regarding bills from these companies.  You will be contacted with the lab results as soon as they are available. The fastest way to get your results is to activate your My  Chart account. Instructions  are located on the last page of this paperwork. If you have not heard from [place] regarding the results in 2 weeks, please contact this office.       I personally performed the services described in this documentation, which was scribed in my presence. The recorded information has been reviewed and considered for accuracy and completeness, addended by me as needed, and agree with information above.  Signed,   [name], MD Primary Care at [place].  06/13/18 8:50 AM

## 2018-06-11 NOTE — Patient Instructions (Addendum)
   Increase Lantus to 10 units/day for now and recheck with me in 1 week to determine further changes.  No other medication changes for now.  If you have lab work done today you will be contacted with your lab results within the next 2 weeks.  If you have not heard from [place] then please contact us. The fastest way to get your results is to register for My Chart.   IF you received an x-ray today, you will receive an invoice from [facility]. Please contact [facility] at [phone] with questions or concerns regarding your invoice.   IF you received labwork today, you will receive an invoice from [place]. Please contact [facility] at [phone] with questions or concerns regarding your invoice.   Our billing staff will not be able to assist you with questions regarding bills from these companies.  You will be contacted with the lab results as soon as they are available. The fastest way to get your results is to activate your My Chart account. Instructions are located on the last page of this paperwork. If you have not heard from [place] regarding the results in 2 weeks, please contact this office.

## 2018-06-13 ENCOUNTER — Encounter: Payer: Self-pay | Admitting: Family Medicine

## 2018-06-17 ENCOUNTER — Encounter: Payer: Self-pay | Admitting: Family Medicine

## 2018-06-17 ENCOUNTER — Other Ambulatory Visit: Payer: Self-pay

## 2018-06-17 ENCOUNTER — Ambulatory Visit (INDEPENDENT_AMBULATORY_CARE_PROVIDER_SITE_OTHER): Payer: Medicare Other | Admitting: Family Medicine

## 2018-06-17 VITALS — BP 131/70 | HR 55 | Temp 97.9°F | Ht 67.0 in | Wt 161.2 lb

## 2018-06-17 DIAGNOSIS — E1165 Type 2 diabetes mellitus with hyperglycemia: Secondary | ICD-10-CM | POA: Diagnosis not present

## 2018-06-17 DIAGNOSIS — IMO0001 Reserved for inherently not codable concepts without codable children: Secondary | ICD-10-CM

## 2018-06-17 LAB — GLUCOSE, POCT (MANUAL RESULT ENTRY): POC Glucose: 182 mg/dl — AB (ref 70–99)

## 2018-06-17 NOTE — Patient Instructions (Addendum)
Increase lantus to 12 units per day for now. Recheck in 1 month. If blood sugars in 200 range - let me know so we can discuss other changes. Also watch for low blood sugars, and if those occur, return to discuss med changes. Check blood sugars fasting or 2 hours after meals.    Type 2 Diabetes Mellitus, Self Care, Adult When you have type 2 diabetes (type 2 diabetes mellitus), you must keep your blood sugar (glucose) under control. You can do this with:  Nutrition.  Exercise.  Lifestyle changes.  Medicines or insulin, if needed.  Support from your doctors and others.  How do I manage my blood sugar?  Check your blood sugar level every day, as often as told.  Call your doctor if your blood sugar is above your goal numbers for 2 tests in a row.  Have your A1c (hemoglobin A1c) level checked at least two times a year. Have it checked more often if your doctor tells you to. Your doctor will set treatment goals for you. Generally, you should have these blood sugar levels:  Before meals (preprandial): 80-130 mg/dL (4.4-7.2 mmol/L).  After meals (postprandial): lower than 180 mg/dL (10 mmol/L).  A1c level: less than 7%.  What do I need to know about high blood sugar? High blood sugar is called hyperglycemia. Know the signs of high blood sugar. Signs may include:  Feeling: ? Thirsty. ? Hungry. ? Very tired.  Needing to pee (urinate) more than usual.  Blurry vision.  What do I need to know about low blood sugar? Low blood sugar is called hypoglycemia. This is when blood sugar is at or below 70 mg/dL (3.9 mmol/L). Symptoms may include:  Feeling: ? Hungry. ? Worried or nervous (anxious). ? Sweaty and clammy. ? Confused. ? Dizzy. ? Sleepy. ? Sick to your stomach (nauseous).  Having: ? A fast heartbeat (palpitations). ? A headache. ? A change in your vision. ? Jerky movements that you cannot control (seizure). ? Nightmares. ? Tingling or no feeling (numbness) around  the mouth, lips, or tongue.  Having trouble with: ? Talking. ? Paying attention (concentrating). ? Moving (coordination). ? Sleeping.  Shaking.  Passing out (fainting).  Getting upset easily (irritability).  Treating low blood sugar  To treat low blood sugar, eat or drink something sugary right away. If you can think clearly and swallow safely, follow the 15:15 rule:  Take 15 grams of a fast-acting carb (carbohydrate). Some fast-acting carbs are: ? 1 tube of glucose gel. ? 3 sugar tablets (glucose pills). ? 6-8 pieces of hard candy. ? 4 oz (120 mL) of fruit juice. ? 4 oz (120 mL) regular (not diet) soda.  Check your blood sugar 15 minutes after you take the carb.  If your blood sugar is still at or below 70 mg/dL (3.9 mmol/L), take 15 grams of a carb again.  If your blood sugar does not go above 70 mg/dL (3.9 mmol/L) after 3 tries, get help right away.  After your blood sugar goes back to normal, eat a meal or a snack within 1 hour.  Treating very low blood sugar If your blood sugar is at or below 54 mg/dL (3 mmol/L), you have very low blood sugar (severe hypoglycemia). This is an emergency. Do not wait to see if the symptoms will go away. Get medical help right away. Call your local emergency services (911 in the [place] Do not drive yourself to the hospital. If you have very low blood sugar  and you cannot eat or drink, you may need a glucagon shot (injection). A family member or friend should learn how to check your blood sugar and how to give you a glucagon shot. Ask your doctor if you need to have a glucagon shot kit at home. What else is important to manage my diabetes? Medicine Follow these instructions about insulin and diabetes medicines:  Take them as told by your doctor.  Adjust them as told by your doctor.  Do not run out of them.  Having diabetes can raise your risk for other long-term conditions. These include heart or kidney disease. Your doctor may  prescribe medicines to help prevent problems from diabetes. Food   Make healthy food choices. These include: ? Chicken, fish, egg whites, and beans. ? Oats, whole wheat, bulgur, brown rice, quinoa, and millet. ? Fresh fruits and vegetables. ? Low-fat dairy products. ? Nuts, avocado, olive oil, and canola oil.  Make a food plan with a specialist (dietitian).  Follow instructions from your doctor about what you cannot eat or drink.  Drink enough fluid to keep your pee (urine) clear or pale yellow.  Eat healthy snacks between healthy meals.  Keep track of carbs that you eat. Read food labels. Learn food serving sizes.  Follow your sick day plan when you cannot eat or drink normally. Make this plan with your doctor so it is ready to use. Activity  Exercise at least 3 times a week.  Do not go more than 2 days without exercising.  Talk with your doctor before you start a new exercise. Your doctor may need to adjust your insulin, medicines, or food. Lifestyle   Do not use any tobacco products. These include cigarettes, chewing tobacco, and e-cigarettes.If you need help quitting, ask your doctor.  Ask your doctor how much alcohol is safe for you.  Learn to deal with stress. If you need help with this, ask your doctor. Body care  Stay up to date with your shots (immunizations).  Have your eyes and feet checked by a doctor as often as told.  Check your skin and feet every day. Check for cuts, bruises, redness, blisters, or sores.  Brush your teeth and gums two times a day.  Floss at least one time a day.  Go to the dentist least one time every 6 months.  Stay at a healthy weight. General instructions   Take over-the-counter and prescription medicines only as told by your doctor.  Share your diabetes care plan with: ? Your work or school. ? People you live with.  Check your pee (urine) for ketones: ? When you are sick. ? As told by your doctor.  Carry a card or  wear jewelry that says that you have diabetes.  Ask your doctor: ? Do I need to meet with a diabetes educator? ? Where can I find a support group for people with diabetes?  Keep all follow-up visits as told by your doctor. This is important. Where to find more information: To learn more about diabetes, visit:  American Diabetes Association: www.diabetes.org  American Association of Diabetes Educators: www.diabeteseducator.org/patient-resources  This information is not intended to replace advice given to you by your health care provider. Make sure you discuss any questions you have with your health care provider. Document Released: 11/12/2015 Document Revised: 12/27/2015 Document Reviewed: 08/24/2015 Elsevier Interactive Patient Education  [employer].   If you have lab work done today you will be contacted with your lab results within the  next 2 weeks.  If you have not heard from [place] then please contact us. The fastest way to get your results is to register for My Chart.   IF you received an x-ray today, you will receive an invoice from [facility]. Please contact [facility] at [phone] with questions or concerns regarding your invoice.   IF you received labwork today, you will receive an invoice from [place]. Please contact [facility] at [phone] with questions or concerns regarding your invoice.   Our billing staff will not be able to assist you with questions regarding bills from these companies.  You will be contacted with the lab results as soon as they are available. The fastest way to get your results is to activate your My Chart account. Instructions are located on the last page of this paperwork. If you have not heard from [place] regarding the results in 2 weeks, please contact this office.

## 2018-06-17 NOTE — Progress Notes (Signed)
Subjective:  By signing my name below, I, [name], attest that this documentation has been prepared under the direction and in the presence of [name], MD. Electronically Signed: [name], [place]. 06/17/2018 , 10:36 AM .  Patient was seen in Room 8 .   Patient ID: William Oneill, male    DOB: 1946-06-08, 72 y.o.   MRN: [id number] Chief Complaint  Patient presents with  . Diabetes   HPI William Oneill is a 72 y.o. male  Patient is here for follow up of diabetes. See office visit from 1 week ago. There was some confusion at that time about increasing Lantus dosing. We did increase to Lantus 10 units. He had in office glucose of 279 at that visit. He was continued on metformin 1000 mg bid and Januvia 100 mg qd.   Patient states he's been checking his sugars at home, reading of 182, 232, and then 172. He sometimes checks before he eats, and sometimes after he eats. He's currently on Lantus 10 units. He knows the symptoms with low blood sugar; but denies having any symptomatic lows.   Patient Active Problem List   Diagnosis Date Noted  . 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 Generalized weakness 04/11/2015  . Acute purulent bronchitis 04/11/2015  . Dehydration 04/11/2015  . Nocturia more than twice per night 01/16/2015  . Snoring 01/16/2015  . 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 Obese abdomen 01/16/2015  . Other fatigue 01/16/2015  . Type 2 diabetes mellitus without complication ([name]) 40/98/1191  . Insomnia    Past Medical History:  Diagnosis Date  . Anemia   . Cancer ([place])   . Colon cancer ([name])   . Diabetes mellitus without complication ([name])    type 2  . High cholesterol   . History of kidney stones   . Insomnia   . Pneumonia    x1   Past Surgical History:  Procedure Laterality Date  . COLON SURGERY     laparoscopic ascending colectomy  Dr. Barry Dienes 02-02-18  . COLONOSCOPY    . LAPAROSCOPIC PARTIAL  COLECTOMY N/A 02/02/2018   Procedure: LAPAROSCOPIC ASCENDING COLECTOMY;  Surgeon: Stark Klein, MD;  Location: [facility];  Service: General;  Laterality: N/A;  . NO PAST SURGERIES    . PORTACATH PLACEMENT Left 03/03/2018   Procedure: INSERTION PORT-A-CATH;  Surgeon: Stark Klein, MD;  Location: [facility];  Service: General;  Laterality: Left;  . TONSILLECTOMY     as a child   No Known Allergies Prior to Admission medications   Medication Sig Start Date End Date Taking? Authorizing Provider  acetaminophen (TYLENOL) 325 MG tablet Take 2 tablets (650 mg total) by mouth every 6 (six) hours as needed. 02/06/18   [name], MD  atorvastatin (LIPITOR) 10 MG tablet Take 1 tablet (10 mg total) by mouth daily. 11/12/17   [name], MD  blood glucose meter kit and supplies Dispense based on patient and insurance preference. Use up to four times daily as directed. (FOR ICD-9 250.00, 250.01). 12/11/14   [name], MD  ferrous sulfate 325 (65 FE) MG EC tablet Take 1 tablet (325 mg total) by mouth 3 (three) times daily with meals. 02/19/18   [name], MD  glucose blood (ONE TOUCH ULTRA TEST) test strip USE AS DIRECTED TO TEST UP TO 4 TIMES A DAY 12/07/17   [name], MD  hydrOXYzine (ATARAX/VISTARIL) 25 MG tablet Take  0.5-1 tablets (12.5-25 mg total) by mouth at bedtime as needed (sleep). 03/01/18   [name], MD  ibuprofen (ADVIL,MOTRIN) 600 MG tablet Take 1 tablet (600 mg total) by mouth every 6 (six) hours as needed (pain not controlled with tylenol). 02/06/18   [name], MD  Insulin Glargine (LANTUS SOLOSTAR) 100 UNIT/ML Solostar Pen Inject 8 Units into the skin at bedtime. 05/27/18   [name], MD  Insulin Pen Needle (PEN NEEDLES) 32G X 4 MM MISC 1 application by Does not apply route daily. 05/27/18   [name], MD  Lancets (ACCU-CHEK SOFT TOUCH) lancets Use as instructed 05/27/18   [name], MD  lidocaine-prilocaine (EMLA) cream  Apply to affected area once 02/20/18   [name], MD  metFORMIN (GLUCOPHAGE) 1000 MG tablet Take 1 tablet (1,000 mg total) by mouth 2 (two) times daily with a meal. 03/01/18   [name], MD  Multiple Vitamin (MULTIVITAMIN WITH MINERALS) TABS tablet Take 1 tablet by mouth daily.    [provider]  ondansetron (ZOFRAN ODT) 4 MG disintegrating tablet Take 1 tablet (4 mg total) by mouth every 8 (eight) hours as needed for nausea or vomiting. 05/01/18   [name], PA-C  ondansetron (ZOFRAN) 8 MG tablet Take 1 tablet (8 mg total) by mouth 2 (two) times daily as needed for refractory nausea / vomiting. Start on day 3 after chemotherapy. 02/20/18   [name], MD  prochlorperazine (COMPAZINE) 10 MG tablet Take 1 tablet (10 mg total) by mouth every 6 (six) hours as needed (Nausea or vomiting). 05/25/18   [name], NP  sitaGLIPtin (JANUVIA) 100 MG tablet Take 1 tablet (100 mg total) by mouth daily. 03/01/18   [name], MD   Social History   Socioeconomic History  . Marital status: Single    Spouse name: Not on file  . Number of children: Not on file  . Years of education: Not on file  . Highest education level: Not on file  Occupational History  . Not on file  Social Needs  . Financial resource strain: Not on file  . Food insecurity:    Worry: Not on file    Inability: Not on file  . Transportation needs:    Medical: Not on file    Non-medical: Not on file  Tobacco Use  . Smoking status: Never Smoker  . Smokeless tobacco: Never Used  Substance and Sexual Activity  . Alcohol use: No    Alcohol/week: 0.0 standard drinks  . Drug use: No  . Sexual activity: Yes  Lifestyle  . Physical activity:    Days per week: Not on file    Minutes per session: Not on file  . Stress: Not on file  Relationships  . Social connections:    Talks on phone: Not on file    Gets together: Not on file    Attends religious service: Not on file    Active member of club or  organization: Not on file    Attends meetings of clubs or organizations: Not on file    Relationship status: Not on file  . Intimate partner violence:    Fear of current or ex partner: Not on file    Emotionally abused: Not on file    Physically abused: Not on file    Forced sexual activity: Not on file  Other Topics Concern  . Not on file  Social History Narrative  . Not on file  Review of Systems  Constitutional: Negative for fatigue and unexpected weight change.  Eyes: Negative for visual disturbance.  Respiratory: Negative for cough, chest tightness and shortness of breath.   Cardiovascular: Negative for chest pain, palpitations and leg swelling.  Gastrointestinal: Negative for abdominal pain, blood in stool, diarrhea, nausea and vomiting.  Neurological: Negative for dizziness, light-headedness and headaches.       Objective:   Physical Exam  Constitutional: He is oriented to person, place, and time. He appears well-developed and well-nourished.  HENT:  Head: Normocephalic and atraumatic.  Eyes: Pupils are equal, round, and reactive to light. EOM are normal.  Neck: No JVD present. Carotid bruit is not present.  Cardiovascular: Normal rate, regular rhythm and normal heart sounds.  No murmur heard. Pulmonary/Chest: Effort normal and breath sounds normal. He has no rales.  Musculoskeletal: He exhibits no edema.  Neurological: He is alert and oriented to person, place, and time.  Skin: Skin is warm and dry.  Psychiatric: He has a normal mood and affect.  Vitals reviewed.   Vitals:   06/17/18 1011  BP: 131/70  Pulse: (!) 55  Temp: 97.9 F (36.6 C)  TempSrc: Oral  SpO2: 96%  Weight: 161 lb 3.2 oz (73.1 kg)  Height: '5\' 7"'$  (1.702 m)   Results for orders placed or performed in visit on 06/17/18  POCT glucose (manual entry)  Result Value Ref Range   POC Glucose 182 (A) 70 - 99 mg/dl      Assessment & Plan:   ELLIS MEHAFFEY is a 72 y.o. male Type 2 diabetes  mellitus with hyperglycemia, without long-term current use of insulin ([place]) - Plan: POCT glucose (manual entry), Microalbumin/Creatinine Ratio, Urine  Uncontrolled type 2 diabetes mellitus without complication, without long-term current use of insulin ([facility])  Improving, but still elevated readings.  -increase lantus to 12 units per day, continue other meds same doses and recheck in 1 month.   -Hypoglycemia precautions discussed including potential signs or symptoms  -Handout given on diabetes self-care as well as goals of fasting and 2-hour postprandial readings.    No orders of the defined types were placed in this encounter.  Patient Instructions     Increase lantus to 12 units per day for now. Recheck in 1 month. If blood sugars in 200 range - let me know so we can discuss other changes. Also watch for low blood sugars, and if those occur, return to discuss med changes. Check blood sugars fasting or 2 hours after meals.    Type 2 Diabetes Mellitus, Self Care, Adult When you have type 2 diabetes (type 2 diabetes mellitus), you must keep your blood sugar (glucose) under control. You can do this with:  Nutrition.  Exercise.  Lifestyle changes.  Medicines or insulin, if needed.  Support from your doctors and others.  How do I manage my blood sugar?  Check your blood sugar level every day, as often as told.  Call your doctor if your blood sugar is above your goal numbers for 2 tests in a row.  Have your A1c (hemoglobin A1c) level checked at least two times a year. Have it checked more often if your doctor tells you to. Your doctor will set treatment goals for you. Generally, you should have these blood sugar levels:  Before meals (preprandial): 80-130 mg/dL (4.4-7.2 mmol/L).  After meals (postprandial): lower than 180 mg/dL (10 mmol/L).  A1c level: less than 7%.  What do I need to know about high blood sugar?  High blood sugar is called hyperglycemia. Know the signs of  high blood sugar. Signs may include:  Feeling: ? Thirsty. ? Hungry. ? Very tired.  Needing to pee (urinate) more than usual.  Blurry vision.  What do I need to know about low blood sugar? Low blood sugar is called hypoglycemia. This is when blood sugar is at or below 70 mg/dL (3.9 mmol/L). Symptoms may include:  Feeling: ? Hungry. ? Worried or nervous (anxious). ? Sweaty and clammy. ? Confused. ? Dizzy. ? Sleepy. ? Sick to your stomach (nauseous).  Having: ? A fast heartbeat (palpitations). ? A headache. ? A change in your vision. ? Jerky movements that you cannot control (seizure). ? Nightmares. ? Tingling or no feeling (numbness) around the mouth, lips, or tongue.  Having trouble with: ? Talking. ? Paying attention (concentrating). ? Moving (coordination). ? Sleeping.  Shaking.  Passing out (fainting).  Getting upset easily (irritability).  Treating low blood sugar  To treat low blood sugar, eat or drink something sugary right away. If you can think clearly and swallow safely, follow the 15:15 rule:  Take 15 grams of a fast-acting carb (carbohydrate). Some fast-acting carbs are: ? 1 tube of glucose gel. ? 3 sugar tablets (glucose pills). ? 6-8 pieces of hard candy. ? 4 oz (120 mL) of fruit juice. ? 4 oz (120 mL) regular (not diet) soda.  Check your blood sugar 15 minutes after you take the carb.  If your blood sugar is still at or below 70 mg/dL (3.9 mmol/L), take 15 grams of a carb again.  If your blood sugar does not go above 70 mg/dL (3.9 mmol/L) after 3 tries, get help right away.  After your blood sugar goes back to normal, eat a meal or a snack within 1 hour.  Treating very low blood sugar If your blood sugar is at or below 54 mg/dL (3 mmol/L), you have very low blood sugar (severe hypoglycemia). This is an emergency. Do not wait to see if the symptoms will go away. Get medical help right away. Call your local emergency services (911 in the  [place] Do not drive yourself to the hospital. If you have very low blood sugar and you cannot eat or drink, you may need a glucagon shot (injection). A family member or friend should learn how to check your blood sugar and how to give you a glucagon shot. Ask your doctor if you need to have a glucagon shot kit at home. What else is important to manage my diabetes? Medicine Follow these instructions about insulin and diabetes medicines:  Take them as told by your doctor.  Adjust them as told by your doctor.  Do not run out of them.  Having diabetes can raise your risk for other long-term conditions. These include heart or kidney disease. Your doctor may prescribe medicines to help prevent problems from diabetes. Food   Make healthy food choices. These include: ? Chicken, fish, egg whites, and beans. ? Oats, whole wheat, bulgur, brown rice, quinoa, and millet. ? Fresh fruits and vegetables. ? Low-fat dairy products. ? Nuts, avocado, olive oil, and canola oil.  Make a food plan with a specialist (dietitian).  Follow instructions from your doctor about what you cannot eat or drink.  Drink enough fluid to keep your pee (urine) clear or pale yellow.  Eat healthy snacks between healthy meals.  Keep track of carbs that you eat. Read food labels. Learn food serving sizes.  Follow your sick day plan  when you cannot eat or drink normally. Make this plan with your doctor so it is ready to use. Activity  Exercise at least 3 times a week.  Do not go more than 2 days without exercising.  Talk with your doctor before you start a new exercise. Your doctor may need to adjust your insulin, medicines, or food. Lifestyle   Do not use any tobacco products. These include cigarettes, chewing tobacco, and e-cigarettes.If you need help quitting, ask your doctor.  Ask your doctor how much alcohol is safe for you.  Learn to deal with stress. If you need help with this, ask your doctor. Body  care  Stay up to date with your shots (immunizations).  Have your eyes and feet checked by a doctor as often as told.  Check your skin and feet every day. Check for cuts, bruises, redness, blisters, or sores.  Brush your teeth and gums two times a day.  Floss at least one time a day.  Go to the dentist least one time every 6 months.  Stay at a healthy weight. General instructions   Take over-the-counter and prescription medicines only as told by your doctor.  Share your diabetes care plan with: ? Your work or school. ? People you live with.  Check your pee (urine) for ketones: ? When you are sick. ? As told by your doctor.  Carry a card or wear jewelry that says that you have diabetes.  Ask your doctor: ? Do I need to meet with a diabetes educator? ? Where can I find a support group for people with diabetes?  Keep all follow-up visits as told by your doctor. This is important. Where to find more information: To learn more about diabetes, visit:  American Diabetes Association: www.diabetes.org  American Association of Diabetes Educators: www.diabeteseducator.org/patient-resources  This information is not intended to replace advice given to you by your health care provider. Make sure you discuss any questions you have with your health care provider. Document Released: 11/12/2015 Document Revised: 12/27/2015 Document Reviewed: 08/24/2015 Elsevier Interactive Patient Education  [employer].   If you have lab work done today you will be contacted with your lab results within the next 2 weeks.  If you have not heard from [place] then please contact us. The fastest way to get your results is to register for My Chart.   IF you received an x-ray today, you will receive an invoice from [facility]. Please contact [facility] at [phone] with questions or concerns regarding your invoice.   IF you received labwork today, you will receive an invoice from  [facility]. Please contact [facility] at [phone] with questions or concerns regarding your invoice.   Our billing staff will not be able to assist you with questions regarding bills from these companies.  You will be contacted with the lab results as soon as they are available. The fastest way to get your results is to activate your My Chart account. Instructions are located on the last page of this paperwork. If you have not heard from [place] regarding the results in 2 weeks, please contact this office.       I personally performed the services described in this documentation, which was scribed in my presence. The recorded information has been reviewed and considered for accuracy and completeness, addended by me as needed, and agree with information above.  Signed,   [name], MD [facility].  06/17/18 10:48 AM

## 2018-06-18 LAB — MICROALBUMIN / CREATININE URINE RATIO
Creatinine, Urine: 106.2 mg/dL
Microalb/Creat Ratio: 18.5 mg/g creat (ref 0.0–30.0)
Microalbumin, Urine: 19.7 ug/mL

## 2018-06-21 ENCOUNTER — Inpatient Hospital Stay: Payer: Medicare Other

## 2018-06-21 ENCOUNTER — Encounter: Payer: Self-pay | Admitting: Nurse Practitioner

## 2018-06-21 ENCOUNTER — Inpatient Hospital Stay: Payer: Medicare Other | Admitting: Nutrition

## 2018-06-21 ENCOUNTER — Inpatient Hospital Stay (HOSPITAL_BASED_OUTPATIENT_CLINIC_OR_DEPARTMENT_OTHER): Payer: Medicare Other | Admitting: Nurse Practitioner

## 2018-06-21 VITALS — BP 154/91 | HR 64 | Temp 98.4°F | Resp 17 | Ht 67.0 in | Wt 170.0 lb

## 2018-06-21 DIAGNOSIS — D509 Iron deficiency anemia, unspecified: Secondary | ICD-10-CM

## 2018-06-21 DIAGNOSIS — E119 Type 2 diabetes mellitus without complications: Secondary | ICD-10-CM

## 2018-06-21 DIAGNOSIS — C180 Malignant neoplasm of cecum: Secondary | ICD-10-CM

## 2018-06-21 DIAGNOSIS — D701 Agranulocytosis secondary to cancer chemotherapy: Secondary | ICD-10-CM | POA: Diagnosis not present

## 2018-06-21 DIAGNOSIS — R5383 Other fatigue: Secondary | ICD-10-CM

## 2018-06-21 DIAGNOSIS — Z95828 Presence of other vascular implants and grafts: Secondary | ICD-10-CM | POA: Insufficient documentation

## 2018-06-21 DIAGNOSIS — Z5111 Encounter for antineoplastic chemotherapy: Secondary | ICD-10-CM | POA: Diagnosis not present

## 2018-06-21 LAB — CBC WITH DIFFERENTIAL (CANCER CENTER ONLY)
ABS IMMATURE GRANULOCYTES: 0.11 10*3/uL — AB (ref 0.00–0.07)
BASOS PCT: 1 %
Basophils Absolute: 0.1 10*3/uL (ref 0.0–0.1)
Eosinophils Absolute: 0.3 10*3/uL (ref 0.0–0.5)
Eosinophils Relative: 4 %
HCT: 40.9 % (ref 39.0–52.0)
HEMOGLOBIN: 13.5 g/dL (ref 13.0–17.0)
Immature Granulocytes: 2 %
Lymphocytes Relative: 19 %
Lymphs Abs: 1.4 10*3/uL (ref 0.7–4.0)
MCH: 29.1 pg (ref 26.0–34.0)
MCHC: 33 g/dL (ref 30.0–36.0)
MCV: 88.1 fL (ref 80.0–100.0)
MONOS PCT: 7 %
Monocytes Absolute: 0.5 10*3/uL (ref 0.1–1.0)
NEUTROS ABS: 4.8 10*3/uL (ref 1.7–7.7)
Neutrophils Relative %: 67 %
Platelet Count: 132 10*3/uL — ABNORMAL LOW (ref 150–400)
RBC: 4.64 MIL/uL (ref 4.22–5.81)
RDW: 19.1 % — ABNORMAL HIGH (ref 11.5–15.5)
WBC Count: 7.1 10*3/uL (ref 4.0–10.5)
nRBC: 0 % (ref 0.0–0.2)

## 2018-06-21 LAB — CMP (CANCER CENTER ONLY)
ALK PHOS: 207 U/L — AB (ref 38–126)
ALT: 31 U/L (ref 0–44)
AST: 32 U/L (ref 15–41)
Albumin: 3.5 g/dL (ref 3.5–5.0)
Anion gap: 8 (ref 5–15)
BUN: 14 mg/dL (ref 8–23)
CALCIUM: 9 mg/dL (ref 8.9–10.3)
CHLORIDE: 106 mmol/L (ref 98–111)
CO2: 27 mmol/L (ref 22–32)
Creatinine: 0.77 mg/dL (ref 0.61–1.24)
GFR, Est AFR Am: >60 mL/min (ref >60)
GFR, Estimated: >60 mL/min (ref >60)
GLUCOSE: 188 mg/dL — AB (ref 70–99)
Potassium: 4.1 mmol/L (ref 3.5–5.1)
Sodium: 141 mmol/L (ref 135–145)
Total Bilirubin: 1.2 mg/dL (ref 0.3–1.2)
Total Protein: 6.4 g/dL — ABNORMAL LOW (ref 6.5–8.1)

## 2018-06-21 LAB — IRON AND TIBC
IRON: 132 ug/dL (ref 42–163)
Saturation Ratios: 37 % (ref 20–55)
TIBC: 360 ug/dL (ref 202–409)
UIBC: 228 ug/dL (ref 117–376)

## 2018-06-21 LAB — CEA (IN HOUSE-CHCC): CEA (CHCC-In House): 2.49 ng/mL (ref 0.00–5.00)

## 2018-06-21 LAB — FERRITIN: FERRITIN: 213 ng/mL (ref 24–336)

## 2018-06-21 MED ORDER — DEXTROSE 5 % IV SOLN
Freq: Once | INTRAVENOUS | Status: AC
  Administered 2018-06-21: 10:00:00 via INTRAVENOUS
  Filled 2018-06-21: qty 250

## 2018-06-21 MED ORDER — SODIUM CHLORIDE 0.9% FLUSH
10.0000 mL | INTRAVENOUS | Status: DC | PRN
  Administered 2018-06-21: 10 mL
  Filled 2018-06-21: qty 10

## 2018-06-21 MED ORDER — SODIUM CHLORIDE 0.9 % IV SOLN
Freq: Once | INTRAVENOUS | Status: AC
  Administered 2018-06-21: 10:00:00 via INTRAVENOUS
  Filled 2018-06-21: qty 5

## 2018-06-21 MED ORDER — PALONOSETRON HCL INJECTION 0.25 MG/5ML
INTRAVENOUS | Status: AC
  Filled 2018-06-21: qty 5

## 2018-06-21 MED ORDER — OXALIPLATIN CHEMO INJECTION 100 MG/20ML
75.0000 mg/m2 | Freq: Once | INTRAVENOUS | Status: AC
  Administered 2018-06-21: 145 mg via INTRAVENOUS
  Filled 2018-06-21: qty 9

## 2018-06-21 MED ORDER — PALONOSETRON HCL INJECTION 0.25 MG/5ML
0.2500 mg | Freq: Once | INTRAVENOUS | Status: AC
  Administered 2018-06-21: 0.25 mg via INTRAVENOUS

## 2018-06-21 MED ORDER — SODIUM CHLORIDE 0.9 % IV SOLN
2400.0000 mg/m2 | INTRAVENOUS | Status: DC
  Administered 2018-06-21: 4600 mg via INTRAVENOUS
  Filled 2018-06-21: qty 92

## 2018-06-21 MED ORDER — LEUCOVORIN CALCIUM INJECTION 350 MG
400.0000 mg/m2 | Freq: Once | INTRAVENOUS | Status: AC
  Administered 2018-06-21: 768 mg via INTRAVENOUS
  Filled 2018-06-21: qty 38.4

## 2018-06-21 NOTE — Progress Notes (Signed)
Nutrition follow-up completed with patient during infusion for cecal cancer. Patient reports he has been eating better.   He has not been drinking Glucerna as often because of increased oral intake. Weight improved and documented as 170 pounds November 18 increased from 163.8 pounds October 2. Noted glucose 188.  Nutrition diagnosis: Unintended weight loss has improved.  Intervention: Educated patient to continue strategies for increased calories and protein. Recommended patient consume Glucerna between meals if he is not eating well. Provided coupons. Questions were answered.  Teach back method used.  Monitoring, evaluation, goals: Patient will tolerate increased calories and protein for weight maintenance.  Next visit: Monday, December 16 during infusion.  **Disclaimer: This note was dictated with voice recognition software. Similar sounding words can inadvertently be transcribed and this note may contain transcription errors which may not have been corrected upon publication of note.**

## 2018-06-21 NOTE — Progress Notes (Signed)
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 Benson Norway 12/17/17  IMPRESSION -An infiltrative sessile and ulcerated non obstructing large mass in the cecum. This was noted to be in the cecal cap and was partially circumferential and 2cm in length. It was not noted to be bleeding.  -There was a 2 sessile polyps that were very close that were 4 to 71m that were not biopsies.  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  -The cecal mass was a poorly differentiated invasive adenocarcinoma.  [facility] [place]  Accession number is [id number]    12/17/2017 Imaging    CT CAP W Contrast 12/17/17  IMPRESSION: 1. Focal area of abnormal enhancement involving the ascending colon may represent mass discovered on recent colonoscopy. 2. No specific findings identified to suggest metastatic disease. 3. 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 Nonspecific. 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 4. Nonspecific 3 mm  right upper lobe lung nodule is noted. No follow-up needed if patient is low-risk. Non-contrast chest CT can be considered in 12 months if patient is high-risk. This recommendation follows the consensus statement: Guidelines for Management of Incidental Pulmonary Nodules Detected on CT Images: From the Fleischner Society 2017; Radiology 2017; 284:228-243. 5. 7 mm low-density structure in segment 8 of the liver is too small to characterize. 6.  Aortic Atherosclerosis (ICD10-I70.0). 7. Gallstones.    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 BBarry Dieneson 02/02/18      02/02/2018 Pathology Results    Diagnosis 02/02/18  Colon, segmental resection for tumor, right - INVASIVE COLORECTAL ADENOCARCINOMA, TWO TUMORS, 5.5 AND 1.8 CM. - LARGER, 5.5 CM TUMOR INVOLVES VISCERAL PERITONEUM - SMALLER, 1.8 CM TUMOR INVOLVES SUBMUCOSA. - ONE EXTRAMURAL SATELLITE TUMOR NODULE. - METASTATIC CARCINOMA IN ONE OF TWENTY-NINE LYMPH NODES (1/29). - TWO TUBULAR ADENOMAS, 0.6 AND 0.8 CM. - BENIGN APPENDIX WITH FIBROSIS OF THE LUMEN.    02/02/2018 Cancer Staging    Staging form: Colon and Rectum, AJCC 8th Edition - Pathologic stage from 02/02/2018: Stage IIIB (pT4a, pN1a, cM0) - Signed by [name] MD on 02/20/2018    03/08/2018 -  Chemotherapy    Adjuvnat chemo FOLFOX every 2 weeks for 3-6 months starting 03/08/18. Udynaca added at cycle 3 due to neutropenia.    CURRENT THERAPYAdjuvant FOLFOX q2 weeks, plan for 3-6 months;began8/5  INTERVAL HISTORY: Mr. ATagliaferroreturns for follow up and FOLFOX as scheduled. He completed cycle 6 with addition of Emend on 11/4. He notes this  cycle went much better. He had less fatigue and no nausea/vomiting. He did not require anti-emetics. Bowels moving normally. Denies blood in stool. No mucositis. He has gained some weight. Denies fever, chills, cough, chest pain, dyspnea, leg  edema, abdominal pain, dysuria, or neuropathy. He saw PCP last week who increased lantus to 12 units at night. BG was 182 when he checked at home. Denies hypoglycemic episodes.    MEDICAL HISTORY:  Past Medical History:  Diagnosis Date  . Anemia   . Cancer ([place])   . Colon cancer ([place])   . Diabetes mellitus without complication ([place])    type 2  . High cholesterol   . History of kidney stones   . Insomnia   . Pneumonia    x1    SURGICAL HISTORY: Past Surgical History:  Procedure Laterality Date  . COLON SURGERY     laparoscopic ascending colectomy  Dr. Barry Dienes 02-02-18  . COLONOSCOPY    . LAPAROSCOPIC PARTIAL COLECTOMY N/A 02/02/2018   Procedure: LAPAROSCOPIC ASCENDING COLECTOMY;  Surgeon: Stark Klein, MD;  Location: [facility];  Service: General;  Laterality: N/A;  . NO PAST SURGERIES    . PORTACATH PLACEMENT Left 03/03/2018   Procedure: INSERTION PORT-A-CATH;  Surgeon: Stark Klein, MD;  Location: [facility];  Service: General;  Laterality: Left;  . TONSILLECTOMY     as a child    I have reviewed the social history and family history with the patient and they are unchanged from previous note.  ALLERGIES:  has No Known Allergies.  MEDICATIONS:  Current Outpatient Medications  Medication Sig Dispense Refill  . acetaminophen (TYLENOL) 325 MG tablet Take 2 tablets (650 mg total) by mouth every 6 (six) hours as needed.    Marland Kitchen atorvastatin (LIPITOR) 10 MG tablet Take 1 tablet (10 mg total) by mouth daily. 90 tablet 1  . blood glucose meter kit and supplies Dispense based on patient and insurance preference. Use up to four times daily as directed. (FOR ICD-9 250.00, 250.01). 1 each 0  . ferrous sulfate 325 (65 FE) MG EC tablet Take 1 tablet (325 mg total) by mouth 3 (three) times daily with meals. 30 tablet 2  . glucose blood (ONE TOUCH ULTRA TEST) test strip USE AS DIRECTED TO TEST UP TO 4 TIMES A DAY 100 each 6  . hydrOXYzine (ATARAX/VISTARIL) 25 MG tablet Take 0.5-1  tablets (12.5-25 mg total) by mouth at bedtime as needed (sleep). 30 tablet 0  . ibuprofen (ADVIL,MOTRIN) 600 MG tablet Take 1 tablet (600 mg total) by mouth every 6 (six) hours as needed (pain not controlled with tylenol). 30 tablet 0  . Insulin Glargine (LANTUS SOLOSTAR) 100 UNIT/ML Solostar Pen Inject 8 Units into the skin at bedtime. 5 pen PRN  . Insulin Pen Needle (PEN NEEDLES) 32G X 4 MM MISC 1 application by Does not apply route daily. 90 each 3  . Lancets (ACCU-CHEK SOFT TOUCH) lancets Use as instructed 100 each 12  . lidocaine-prilocaine (EMLA) cream Apply to affected area once 30 g 3  . metFORMIN (GLUCOPHAGE) 1000 MG tablet Take 1 tablet (1,000 mg total) by mouth 2 (two) times daily with a meal. 180 tablet 1  . Multiple Vitamin (MULTIVITAMIN WITH MINERALS) TABS tablet Take 1 tablet by mouth daily.    . ondansetron (ZOFRAN ODT) 4 MG disintegrating tablet Take 1 tablet (4 mg total) by mouth every 8 (eight) hours as needed for nausea or vomiting. 6 tablet 0  . ondansetron (ZOFRAN) 8 MG  tablet Take 1 tablet (8 mg total) by mouth 2 (two) times daily as needed for refractory nausea / vomiting. Start on day 3 after chemotherapy. 30 tablet 1  . prochlorperazine (COMPAZINE) 10 MG tablet Take 1 tablet (10 mg total) by mouth every 6 (six) hours as needed (Nausea or vomiting). 30 tablet 1  . sitaGLIPtin (JANUVIA) 100 MG tablet Take 1 tablet (100 mg total) by mouth daily. 90 tablet 1   No current facility-administered medications for this visit.     PHYSICAL EXAMINATION: ECOG PERFORMANCE STATUS: 1 - Symptomatic but completely ambulatory  Vitals:   06/21/18 0831  BP: (!) 154/91  Pulse: 64  Resp: 17  Temp: 98.4 F (36.9 C)  SpO2: 98%   Filed Weights   06/21/18 0831  Weight: 170 lb (77.1 kg)    GENERAL:alert, no distress and comfortable SKIN: no rashes or significant lesions EYES:  sclera clear OROPHARYNX:no thrush or ulcers  LYMPH:  no palpable cervical or supraclavicular  lymphadenopathy LUNGS: clear to auscultation with normal breathing effort HEART: regular rate & rhythm, no lower extremity edema ABDOMEN:abdomen soft, non-tender and normal bowel sounds Musculoskeletal:no cyanosis of digits and no clubbing  NEURO: alert & oriented x 3 with fluent speech, no focal motor/sensory deficits PAC without erythema   LABORATORY DATA:  I have reviewed the data as listed CBC Latest Ref Rng & Units 06/21/2018 06/07/2018 05/25/2018  WBC 4.0 - 10.5 K/uL 7.1 4.7 24.5(H)  Hemoglobin 13.0 - 17.0 g/dL 13.5 13.3 12.8(L)  Hematocrit 39.0 - 52.0 % 40.9 40.3 39.3  Platelets 150 - 400 K/uL 132(L) 203 180     CMP Latest Ref Rng & Units 06/21/2018 06/07/2018 05/25/2018  Glucose 70 - 99 mg/dL 188(H) 239(H) 303(H)  BUN 8 - 23 mg/dL '14 16 18  '$ Creatinine 0.61 - 1.24 mg/dL 0.77 0.75 0.96  Sodium 135 - 145 mmol/L 141 138 139  Potassium 3.5 - 5.1 mmol/L 4.1 4.1 4.1  Chloride 98 - 111 mmol/L 106 106 104  CO2 22 - 32 mmol/L '27 25 26  '$ Calcium 8.9 - 10.3 mg/dL 9.0 8.9 9.3  Total Protein 6.5 - 8.1 g/dL 6.4(L) 6.6 6.9  Total Bilirubin 0.3 - 1.2 mg/dL 1.2 1.5(H) 1.1  Alkaline Phos 38 - 126 U/L 207(H) 146(H) 285(H)  AST 15 - 41 U/L 32 25 35  ALT 0 - 44 U/L '31 25 28      '$ RADIOGRAPHIC STUDIES: I have personally reviewed the radiological images as listed and agreed with the findings in the report. No results found.   ASSESSMENT & PLAN: JAMAIR CATO a 3I.o.Caucasianmalewith a history of DM.  1. Cecal Cancer,adenocarcinoma, grade 2, pT4aN1aM0,stage IIIB, MSS 2. Anemia of iron deficiency, s/p IV Feraheme x2 8/21, 9/10 3. DM- on metformin, glipizide, and [place]; PCP recently increased glipizide to 10 mg BID; 10/24 PCP stopped glipizide and added lantus insulin at bedtime  4. 7 mm liver lesion, seen on staging work up; plan to repeat CT upon completion of chemotherapy to follow the lesion 5. Chemotherapy-induced neutropenia, added Udenyca with cycle 3 6. Severe  fatigue, weakness, decreased po intake after cycle 4 requiring ED visit 9/28, secondary to chemotherapy; oxaliplatin dose-reduced with cycle 5; he had fatigue, n/v/ after cycle 5 required treatment delay   Mr. Mander appears stable. He completed 6 cycles adjuvant FOLFOX. Emend was added from cycle 6, he tolerated much better with no nausea/vomiting. He gained weight intentionally. He had less fatigue.   We again discussed the timeline for his  treatments, which is planned 3-6 months of adjuvant therapy due to his T4N1a disease. He has completed 3 months. He did not tolerate cycles 4 and 5 very well and was reluctant to continue chemotherapy. He tolerated cycle 6 much better and agrees to continue for now. He knows he can discontinue at any time.   Labs reviewed, CBC and CMP adequate for treatment. Iron studies, CEA pending. He continues close f/u with PCP for chronic conditions and DM. Insulin has been adjusted recently to 12 units qHS, BG 188 today, improved.   He will proceed with cycle 7 FOLFOX at current dose today, with Udenyca on day 3. He will return in 2 weeks for f/u with Dr. Burr Medico and cycle 8.   PLAN: -Labs reviewed, proceed with cycle 7 adjuvant FOLFOX at current dose -Udenyca with pump d/c on day 3 -F/u in 2 weeks with Dr. Burr Medico and cycle 8  All questions were answered. The patient knows to call the clinic with any problems, questions or concerns. No barriers to learning was detected.     [name], NP 06/21/18

## 2018-06-21 NOTE — Patient Instructions (Signed)
[facility] Discharge Instructions for Patients Receiving Chemotherapy  Today you received the following chemotherapy agents: Oxaliplatin, Leucovorin, and 5FU.  To help prevent nausea and vomiting after your treatment, we encourage you to take your nausea medication as directed.   If you develop nausea and vomiting that is not controlled by your nausea medication, call the clinic.   BELOW ARE SYMPTOMS THAT SHOULD BE REPORTED IMMEDIATELY:  *FEVER GREATER THAN 100.5 F  *CHILLS WITH OR WITHOUT FEVER  NAUSEA AND VOMITING THAT IS NOT CONTROLLED WITH YOUR NAUSEA MEDICATION  *UNUSUAL SHORTNESS OF BREATH  *UNUSUAL BRUISING OR BLEEDING  TENDERNESS IN MOUTH AND THROAT WITH OR WITHOUT PRESENCE OF ULCERS  *URINARY PROBLEMS  *BOWEL PROBLEMS  UNUSUAL RASH Items with * indicate a potential emergency and should be followed up as soon as possible.  Feel free to call the clinic should you have any questions or concerns. The clinic phone number is [phone].  Please show the CHEMO ALERT CARD at check-in to the Emergency Department and triage nurse.    

## 2018-06-23 ENCOUNTER — Inpatient Hospital Stay: Payer: Medicare Other

## 2018-06-23 VITALS — BP 148/68 | HR 62 | Temp 98.5°F | Resp 17

## 2018-06-23 DIAGNOSIS — Z5111 Encounter for antineoplastic chemotherapy: Secondary | ICD-10-CM | POA: Diagnosis not present

## 2018-06-23 DIAGNOSIS — C180 Malignant neoplasm of cecum: Secondary | ICD-10-CM

## 2018-06-23 MED ORDER — PEGFILGRASTIM-CBQV 6 MG/0.6ML ~~LOC~~ SOSY
PREFILLED_SYRINGE | SUBCUTANEOUS | Status: AC
  Filled 2018-06-23: qty 0.6

## 2018-06-23 MED ORDER — PEGFILGRASTIM-CBQV 6 MG/0.6ML ~~LOC~~ SOSY
6.0000 mg | PREFILLED_SYRINGE | Freq: Once | SUBCUTANEOUS | Status: AC
  Administered 2018-06-23: 6 mg via SUBCUTANEOUS

## 2018-06-23 MED ORDER — HEPARIN SOD (PORK) LOCK FLUSH 100 UNIT/ML IV SOLN
500.0000 [IU] | Freq: Once | INTRAVENOUS | Status: AC | PRN
  Administered 2018-06-23: 500 [IU]
  Filled 2018-06-23: qty 5

## 2018-06-23 MED ORDER — SODIUM CHLORIDE 0.9% FLUSH
10.0000 mL | INTRAVENOUS | Status: DC | PRN
  Administered 2018-06-23: 10 mL
  Filled 2018-06-23: qty 10

## 2018-07-04 NOTE — Progress Notes (Signed)
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 Benson Norway 12/17/17  IMPRESSION -An infiltrative sessile and ulcerated non obstructing large mass in the cecum. This was noted to be in the cecal cap and was partially circumferential and 2cm in length. It was not noted to be bleeding.  -There was a 2 sessile polyps that were very close that were 4 to 63m that were not biopsies.  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  -The cecal mass was a poorly differentiated invasive adenocarcinoma.  [facility] [place]  Accession number is [id number]    12/17/2017 Imaging    CT CAP W Contrast 12/17/17  IMPRESSION: 1. Focal area of abnormal enhancement involving the ascending colon may represent mass discovered on recent colonoscopy. 2. No specific findings identified to suggest metastatic disease. 3. 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 Nonspecific. 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 4. Nonspecific 3 mm right upper lobe lung nodule is noted. No follow-up needed if patient is low-risk. Non-contrast chest CT can be considered in 12 months if patient is high-risk. This recommendation follows the consensus statement: Guidelines for Management of Incidental Pulmonary Nodules Detected on CT Images: From the Fleischner Society 2017; Radiology 2017; 284:228-243. 5. 7 mm low-density structure in segment 8 of the liver is too small to characterize. 6.  Aortic Atherosclerosis (ICD10-I70.0). 7. Gallstones.    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 BBarry Dieneson 02/02/18      02/02/2018 Pathology Results    Diagnosis 02/02/18  Colon, segmental resection for tumor, right - INVASIVE COLORECTAL ADENOCARCINOMA, TWO TUMORS, 5.5 AND 1.8 CM. - LARGER, 5.5 CM TUMOR INVOLVES VISCERAL PERITONEUM - SMALLER, 1.8 CM TUMOR INVOLVES SUBMUCOSA. - ONE EXTRAMURAL SATELLITE TUMOR NODULE. - METASTATIC CARCINOMA IN ONE OF TWENTY-NINE LYMPH NODES (1/29). - TWO TUBULAR ADENOMAS, 0.6 AND 0.8 CM. - BENIGN APPENDIX WITH FIBROSIS OF THE LUMEN.    02/02/2018 Cancer Staging    Staging form: Colon and Rectum, AJCC 8th Edition - Pathologic stage from 02/02/2018: Stage IIIB (pT4a, pN1a, cM0) - Signed by [name] MD on 02/20/2018    03/08/2018 -  Chemotherapy    Adjuvnat chemo FOLFOX every 2 weeks for 3-6 months starting 03/08/18. Udynaca added at cycle 3 due to neutropenia.     CURRENT THERAPY FOLFOX 2q weeks for 3-6 months. Started 03/08/2018  INTERVAL HISTORY: William BJORKMANis a 72y.o. male who is here for follow-up.  He is here today alone. He is doing well and is tolerating chemo nicely. He denies numbness or tingling of extremities. He doesn't eat or drink cold things. He is eating well. He is capable of performing daily activities, but feels fatigued sometimes. He likes to stay  awake and spend time with friends. He can drive independently. He denies cough, abdominal pain, LL edema, or tongue changes.    Pertinent positives and negatives of review of systems are listed and detailed within the above HPI.   REVIEW OF SYSTEMS:   Constitutional: Denies fevers, chills or abnormal weight loss (+) fatigue Eyes: Denies blurriness of vision Ears, nose, mouth, throat, and face: Denies mucositis or sore throat Respiratory: Denies cough, dyspnea or wheezes Cardiovascular: Denies palpitation, chest discomfort or lower extremity swelling Gastrointestinal:  Denies nausea, heartburn or change in bowel habits Skin: Denies abnormal skin rashes Lymphatics: Denies new lymphadenopathy or easy bruising Neurological:Denies numbness, tingling or new weaknesses Behavioral/Psych: Mood is stable, no new changes  All other systems were reviewed with the patient and are negative.  MEDICAL HISTORY:  Past Medical History:  Diagnosis Date  . Anemia   . Cancer ([place])   . Colon cancer ([place])   . Diabetes mellitus without complication ([place])    type 2  . High cholesterol   . History of kidney stones   . Insomnia   . Pneumonia    x1    SURGICAL HISTORY: Past Surgical History:  Procedure Laterality Date  . COLON SURGERY     laparoscopic ascending colectomy  Dr. Barry Dienes 02-02-18  . COLONOSCOPY    . LAPAROSCOPIC PARTIAL COLECTOMY N/A 02/02/2018   Procedure: LAPAROSCOPIC ASCENDING COLECTOMY;  Surgeon: Stark Klein, MD;  Location: [facility];  Service: General;  Laterality: N/A;  . NO PAST SURGERIES    . PORTACATH PLACEMENT Left 03/03/2018   Procedure: INSERTION PORT-A-CATH;  Surgeon: Stark Klein, MD;  Location: [facility];  Service: General;  Laterality: Left;  . TONSILLECTOMY     as a child    I have reviewed the social history and family history with the patient and they are unchanged from previous note.  ALLERGIES:  has No Known Allergies.  MEDICATIONS:  Current  Outpatient Medications  Medication Sig Dispense Refill  . acetaminophen (TYLENOL) 325 MG tablet Take 2 tablets (650 mg total) by mouth every 6 (six) hours as needed.    Marland Kitchen atorvastatin (LIPITOR) 10 MG tablet Take 1 tablet (10 mg total) by mouth daily. 90 tablet 1  . blood glucose meter kit and supplies Dispense based on patient and insurance preference. Use up to four times daily as directed. (FOR ICD-9 250.00, 250.01). 1 each 0  . ferrous sulfate 325 (65 FE) MG EC tablet Take 1 tablet (325 mg total) by mouth 3 (three) times daily with meals. 30 tablet 2  . glucose blood (ONE TOUCH ULTRA TEST) test strip USE AS DIRECTED TO TEST UP TO 4 TIMES A DAY 100 each 6  . hydrOXYzine (ATARAX/VISTARIL) 25 MG tablet Take 0.5-1 tablets (12.5-25 mg total) by mouth at bedtime as needed (sleep). 30 tablet 0  . ibuprofen (ADVIL,MOTRIN) 600 MG tablet Take 1 tablet (600 mg total) by mouth every 6 (six) hours as needed (pain not controlled with tylenol). 30 tablet 0  . Insulin Glargine (LANTUS SOLOSTAR) 100 UNIT/ML Solostar Pen Inject 8 Units into the skin at bedtime. 5 pen PRN  . Insulin Pen Needle (PEN NEEDLES) 32G X 4 MM MISC 1 application by Does  not apply route daily. 90 each 3  . Lancets (ACCU-CHEK SOFT TOUCH) lancets Use as instructed 100 each 12  . lidocaine-prilocaine (EMLA) cream Apply to affected area once 30 g 3  . metFORMIN (GLUCOPHAGE) 1000 MG tablet Take 1 tablet (1,000 mg total) by mouth 2 (two) times daily with a meal. 180 tablet 1  . Multiple Vitamin (MULTIVITAMIN WITH MINERALS) TABS tablet Take 1 tablet by mouth daily.    . ondansetron (ZOFRAN ODT) 4 MG disintegrating tablet Take 1 tablet (4 mg total) by mouth every 8 (eight) hours as needed for nausea or vomiting. 6 tablet 0  . ondansetron (ZOFRAN) 8 MG tablet Take 1 tablet (8 mg total) by mouth 2 (two) times daily as needed for refractory nausea / vomiting. Start on day 3 after chemotherapy. 30 tablet 1  . prochlorperazine (COMPAZINE) 10 MG tablet  Take 1 tablet (10 mg total) by mouth every 6 (six) hours as needed (Nausea or vomiting). 30 tablet 1  . sitaGLIPtin (JANUVIA) 100 MG tablet Take 1 tablet (100 mg total) by mouth daily. 90 tablet 1   No current facility-administered medications for this visit.     PHYSICAL EXAMINATION: ECOG PERFORMANCE STATUS: 1 - Symptomatic but completely ambulatory  Vitals:   07/05/18 0848  BP: (!) 142/80  Pulse: 62  Resp: 20  Temp: 97.7 F (36.5 C)  SpO2: 100%   Filed Weights   07/05/18 0848  Weight: 169 lb 3.2 oz (76.7 kg)    GENERAL:alert, no distress and comfortable SKIN: skin color, texture, turgor are normal, no rashes or significant lesions EYES: normal, Conjunctiva are pink and non-injected, sclera clear OROPHARYNX:no exudate, no erythema and lips, buccal mucosa, and tongue normal  NECK: supple, thyroid normal size, non-tender, without nodularity LYMPH:  no palpable lymphadenopathy in the cervical, axillary or inguinal LUNGS: clear to auscultation and percussion with normal breathing effort HEART: regular rate & rhythm and no murmurs and no lower extremity edema ABDOMEN:abdomen soft, non-tender and normal bowel sounds Musculoskeletal:no cyanosis of digits and no clubbing  NEURO: alert & oriented x 3 with fluent speech, no focal motor/sensory deficits. Good sense of vibration in bilateral ULs  LABORATORY DATA:  I have reviewed the data as listed CBC Latest Ref Rng & Units 07/05/2018 06/21/2018 06/07/2018  WBC 4.0 - 10.5 K/uL 8.9 7.1 4.7  Hemoglobin 13.0 - 17.0 g/dL 13.6 13.5 13.3  Hematocrit 39.0 - 52.0 % 40.4 40.9 40.3  Platelets 150 - 400 K/uL 125(L) 132(L) 203     CMP Latest Ref Rng & Units 07/05/2018 06/21/2018 06/07/2018  Glucose 70 - 99 mg/dL 220(H) 188(H) 239(H)  BUN 8 - 23 mg/dL '13 14 16  '$ Creatinine 0.61 - 1.24 mg/dL 0.78 0.77 0.75  Sodium 135 - 145 mmol/L 142 141 138  Potassium 3.5 - 5.1 mmol/L 4.2 4.1 4.1  Chloride 98 - 111 mmol/L 108 106 106  CO2 22 - 32 mmol/L '24  27 25  '$ Calcium 8.9 - 10.3 mg/dL 9.0 9.0 8.9  Total Protein 6.5 - 8.1 g/dL 6.5 6.4(L) 6.6  Total Bilirubin 0.3 - 1.2 mg/dL 0.9 1.2 1.5(H)  Alkaline Phos 38 - 126 U/L 234(H) 207(H) 146(H)  AST 15 - 41 U/L 43(H) 32 25  ALT 0 - 44 U/L 35 31 25      RADIOGRAPHIC STUDIES: I have personally reviewed the radiological images as listed and agreed with the findings in the report. No results found.   ASSESSMENT & PLAN:  William Oneill is a 72 y.o.  male with history of  1. Cecal Cancer,adenocarcinoma, grade 2, pT4aN1aM0,stage IIIB, MSS -Diagnosed 02/2018.  Status post hemicolectomy with negative surgical margins  -He is currently on adjuvant chemotherapy FOLFOX q2 weeks, plan for 6 months, due to high risk of recurrence. Tolerating well with mild fatigue and weakness.  Nausea much improved with IV Emend, no significant neuropathy. -Labs reviewed, CBC showed Hg 13.6 PLT 125K. CMP pending.  We will proceed chemo today -Plan to continue for 4 more treatments, to complete a total of 6 months of therapy. -Plan to repeat a staging CT scan after he completes chemo.  2. IDA -Received IV Feraheme x2 on 03/24/2018 and 04/13/2018, anemia resolved. -Labs reviewed, Hg 13.6 RDW 17.0.   3. DM -Continue metformin, lantus insulin at bedtime, and januvia. -f/u with PCP  4. 96m Liver lesion -Plan to repeat CT upon completion of chemo  5. Chemotherapy related fatigue and weakness -Started after cycle 4. Cycle 5 required dose reduction and delay -He tries to stay active and awake. His fatigue is improving.   Plan  -Lab reviewed, adequate for treatment, will proceed FOLFOX today and continue every 2 weeks -Follow-up in 4 weeks    No problem-specific Assessment & Plan notes found for this encounter.   No orders of the defined types were placed in this encounter.  All questions were answered. The patient knows to call the clinic with any problems, questions or concerns. No barriers to learning was  detected. I spent 15 minutes counseling the patient face to face. The total time spent in the appointment was 20 minutes and more than 50% was on counseling and review of test results  I, Noor Dweik am acting as scribe for Dr. YTruitt Merle  I have reviewed the above documentation for accuracy and completeness, and I agree with the above.      YTruitt Merle MD 07/05/2018

## 2018-07-05 ENCOUNTER — Telehealth: Payer: Self-pay | Admitting: Hematology

## 2018-07-05 ENCOUNTER — Inpatient Hospital Stay: Payer: Medicare Other

## 2018-07-05 ENCOUNTER — Inpatient Hospital Stay (HOSPITAL_BASED_OUTPATIENT_CLINIC_OR_DEPARTMENT_OTHER): Payer: Medicare Other | Admitting: Hematology

## 2018-07-05 ENCOUNTER — Inpatient Hospital Stay: Payer: Medicare Other | Attending: Hematology

## 2018-07-05 ENCOUNTER — Encounter: Payer: Self-pay | Admitting: Hematology

## 2018-07-05 VITALS — BP 142/80 | HR 62 | Temp 97.7°F | Resp 20 | Ht 67.0 in | Wt 169.2 lb

## 2018-07-05 DIAGNOSIS — K769 Liver disease, unspecified: Secondary | ICD-10-CM | POA: Diagnosis not present

## 2018-07-05 DIAGNOSIS — R531 Weakness: Secondary | ICD-10-CM | POA: Diagnosis not present

## 2018-07-05 DIAGNOSIS — Z452 Encounter for adjustment and management of vascular access device: Secondary | ICD-10-CM | POA: Insufficient documentation

## 2018-07-05 DIAGNOSIS — E119 Type 2 diabetes mellitus without complications: Secondary | ICD-10-CM | POA: Diagnosis not present

## 2018-07-05 DIAGNOSIS — R5383 Other fatigue: Secondary | ICD-10-CM

## 2018-07-05 DIAGNOSIS — Z5111 Encounter for antineoplastic chemotherapy: Secondary | ICD-10-CM | POA: Insufficient documentation

## 2018-07-05 DIAGNOSIS — C180 Malignant neoplasm of cecum: Secondary | ICD-10-CM | POA: Insufficient documentation

## 2018-07-05 DIAGNOSIS — Z95828 Presence of other vascular implants and grafts: Secondary | ICD-10-CM

## 2018-07-05 DIAGNOSIS — D509 Iron deficiency anemia, unspecified: Secondary | ICD-10-CM | POA: Insufficient documentation

## 2018-07-05 DIAGNOSIS — Z5189 Encounter for other specified aftercare: Secondary | ICD-10-CM | POA: Insufficient documentation

## 2018-07-05 LAB — CBC WITH DIFFERENTIAL (CANCER CENTER ONLY)
Abs Immature Granulocytes: 0.62 10*3/uL — ABNORMAL HIGH (ref 0.00–0.07)
BASOS PCT: 2 %
Basophils Absolute: 0.1 10*3/uL (ref 0.0–0.1)
Eosinophils Absolute: 0.3 10*3/uL (ref 0.0–0.5)
Eosinophils Relative: 3 %
HCT: 40.4 % (ref 39.0–52.0)
Hemoglobin: 13.6 g/dL (ref 13.0–17.0)
Immature Granulocytes: 7 %
Lymphocytes Relative: 17 %
Lymphs Abs: 1.6 10*3/uL (ref 0.7–4.0)
MCH: 30.4 pg (ref 26.0–34.0)
MCHC: 33.7 g/dL (ref 30.0–36.0)
MCV: 90.4 fL (ref 80.0–100.0)
MONO ABS: 0.9 10*3/uL (ref 0.1–1.0)
MONOS PCT: 10 %
NEUTROS PCT: 61 %
Neutro Abs: 5.4 10*3/uL (ref 1.7–7.7)
PLATELETS: 125 10*3/uL — AB (ref 150–400)
RBC: 4.47 MIL/uL (ref 4.22–5.81)
RDW: 17 % — AB (ref 11.5–15.5)
WBC: 8.9 10*3/uL (ref 4.0–10.5)
nRBC: 0 % (ref 0.0–0.2)

## 2018-07-05 LAB — CMP (CANCER CENTER ONLY)
ALBUMIN: 3.5 g/dL (ref 3.5–5.0)
ALT: 35 U/L (ref 0–44)
ANION GAP: 10 (ref 5–15)
AST: 43 U/L — ABNORMAL HIGH (ref 15–41)
Alkaline Phosphatase: 234 U/L — ABNORMAL HIGH (ref 38–126)
BILIRUBIN TOTAL: 0.9 mg/dL (ref 0.3–1.2)
BUN: 13 mg/dL (ref 8–23)
CALCIUM: 9 mg/dL (ref 8.9–10.3)
CO2: 24 mmol/L (ref 22–32)
Chloride: 108 mmol/L (ref 98–111)
Creatinine: 0.78 mg/dL (ref 0.61–1.24)
GFR, Est AFR Am: >60 mL/min (ref >60)
GLUCOSE: 220 mg/dL — AB (ref 70–99)
Potassium: 4.2 mmol/L (ref 3.5–5.1)
Sodium: 142 mmol/L (ref 135–145)
TOTAL PROTEIN: 6.5 g/dL (ref 6.5–8.1)

## 2018-07-05 MED ORDER — LEUCOVORIN CALCIUM INJECTION 350 MG
400.0000 mg/m2 | Freq: Once | INTRAVENOUS | Status: AC
  Administered 2018-07-05: 768 mg via INTRAVENOUS
  Filled 2018-07-05: qty 38.4

## 2018-07-05 MED ORDER — SODIUM CHLORIDE 0.9% FLUSH
10.0000 mL | INTRAVENOUS | Status: DC | PRN
  Administered 2018-07-05: 10 mL
  Filled 2018-07-05: qty 10

## 2018-07-05 MED ORDER — PALONOSETRON HCL INJECTION 0.25 MG/5ML
0.2500 mg | Freq: Once | INTRAVENOUS | Status: AC
  Administered 2018-07-05: 0.25 mg via INTRAVENOUS

## 2018-07-05 MED ORDER — PALONOSETRON HCL INJECTION 0.25 MG/5ML
INTRAVENOUS | Status: AC
  Filled 2018-07-05: qty 5

## 2018-07-05 MED ORDER — DEXTROSE 5 % IV SOLN
Freq: Once | INTRAVENOUS | Status: AC
  Administered 2018-07-05: 11:00:00 via INTRAVENOUS
  Filled 2018-07-05: qty 250

## 2018-07-05 MED ORDER — SODIUM CHLORIDE 0.9 % IV SOLN
Freq: Once | INTRAVENOUS | Status: AC
  Administered 2018-07-05: 10:00:00 via INTRAVENOUS
  Filled 2018-07-05: qty 5

## 2018-07-05 MED ORDER — SODIUM CHLORIDE 0.9 % IV SOLN
2400.0000 mg/m2 | INTRAVENOUS | Status: DC
  Administered 2018-07-05: 4600 mg via INTRAVENOUS
  Filled 2018-07-05: qty 92

## 2018-07-05 MED ORDER — DEXTROSE 5 % IV SOLN
INTRAVENOUS | Status: DC
  Administered 2018-07-05: 10:00:00 via INTRAVENOUS
  Filled 2018-07-05: qty 250

## 2018-07-05 MED ORDER — OXALIPLATIN CHEMO INJECTION 100 MG/20ML
75.0000 mg/m2 | Freq: Once | INTRAVENOUS | Status: AC
  Administered 2018-07-05: 145 mg via INTRAVENOUS
  Filled 2018-07-05: qty 20

## 2018-07-05 NOTE — Patient Instructions (Signed)
[facility] Discharge Instructions for Patients Receiving Chemotherapy  Today you received the following chemotherapy agents: Oxaliplatin, Leucovorin, and 5FU.  To help prevent nausea and vomiting after your treatment, we encourage you to take your nausea medication as directed.   If you develop nausea and vomiting that is not controlled by your nausea medication, call the clinic.   BELOW ARE SYMPTOMS THAT SHOULD BE REPORTED IMMEDIATELY:  *FEVER GREATER THAN 100.5 F  *CHILLS WITH OR WITHOUT FEVER  NAUSEA AND VOMITING THAT IS NOT CONTROLLED WITH YOUR NAUSEA MEDICATION  *UNUSUAL SHORTNESS OF BREATH  *UNUSUAL BRUISING OR BLEEDING  TENDERNESS IN MOUTH AND THROAT WITH OR WITHOUT PRESENCE OF ULCERS  *URINARY PROBLEMS  *BOWEL PROBLEMS  UNUSUAL RASH Items with * indicate a potential emergency and should be followed up as soon as possible.  Feel free to call the clinic should you have any questions or concerns. The clinic phone number is [phone].  Please show the CHEMO ALERT CARD at check-in to the Emergency Department and triage nurse.    

## 2018-07-05 NOTE — Telephone Encounter (Signed)
Printed calendar and avs. °

## 2018-07-06 ENCOUNTER — Encounter: Payer: Self-pay | Admitting: *Deleted

## 2018-07-07 ENCOUNTER — Inpatient Hospital Stay: Payer: Medicare Other

## 2018-07-07 VITALS — BP 142/72 | HR 61 | Temp 98.2°F | Resp 18

## 2018-07-07 DIAGNOSIS — C180 Malignant neoplasm of cecum: Secondary | ICD-10-CM

## 2018-07-07 DIAGNOSIS — Z5111 Encounter for antineoplastic chemotherapy: Secondary | ICD-10-CM | POA: Diagnosis not present

## 2018-07-07 MED ORDER — SODIUM CHLORIDE 0.9% FLUSH
10.0000 mL | INTRAVENOUS | Status: DC | PRN
  Administered 2018-07-07: 10 mL
  Filled 2018-07-07: qty 10

## 2018-07-07 MED ORDER — HEPARIN SOD (PORK) LOCK FLUSH 100 UNIT/ML IV SOLN
500.0000 [IU] | Freq: Once | INTRAVENOUS | Status: AC | PRN
  Administered 2018-07-07: 500 [IU]
  Filled 2018-07-07: qty 5

## 2018-07-07 MED ORDER — PEGFILGRASTIM-CBQV 6 MG/0.6ML ~~LOC~~ SOSY
6.0000 mg | PREFILLED_SYRINGE | Freq: Once | SUBCUTANEOUS | Status: AC
  Administered 2018-07-07: 6 mg via SUBCUTANEOUS

## 2018-07-07 MED ORDER — PEGFILGRASTIM-CBQV 6 MG/0.6ML ~~LOC~~ SOSY
PREFILLED_SYRINGE | SUBCUTANEOUS | Status: AC
  Filled 2018-07-07: qty 0.6

## 2018-07-19 ENCOUNTER — Ambulatory Visit: Payer: Medicare Other | Admitting: Hematology

## 2018-07-19 ENCOUNTER — Inpatient Hospital Stay: Payer: Medicare Other | Admitting: Nutrition

## 2018-07-19 ENCOUNTER — Inpatient Hospital Stay: Payer: Medicare Other

## 2018-07-19 VITALS — BP 158/87 | HR 57 | Temp 97.8°F | Resp 18 | Wt 168.1 lb

## 2018-07-19 DIAGNOSIS — C180 Malignant neoplasm of cecum: Secondary | ICD-10-CM

## 2018-07-19 DIAGNOSIS — Z5111 Encounter for antineoplastic chemotherapy: Secondary | ICD-10-CM | POA: Diagnosis not present

## 2018-07-19 DIAGNOSIS — R739 Hyperglycemia, unspecified: Secondary | ICD-10-CM

## 2018-07-19 LAB — CMP (CANCER CENTER ONLY)
ALT: 37 U/L (ref 0–44)
AST: 39 U/L (ref 15–41)
Albumin: 3.6 g/dL (ref 3.5–5.0)
Alkaline Phosphatase: 259 U/L — ABNORMAL HIGH (ref 38–126)
Anion gap: 9 (ref 5–15)
BILIRUBIN TOTAL: 1.3 mg/dL — AB (ref 0.3–1.2)
BUN: 13 mg/dL (ref 8–23)
CO2: 25 mmol/L (ref 22–32)
Calcium: 9.2 mg/dL (ref 8.9–10.3)
Chloride: 103 mmol/L (ref 98–111)
Creatinine: 0.81 mg/dL (ref 0.61–1.24)
GFR, Est AFR Am: >60 mL/min (ref >60)
GFR, Estimated: >60 mL/min (ref >60)
Glucose, Bld: 295 mg/dL — ABNORMAL HIGH (ref 70–99)
Potassium: 4.3 mmol/L (ref 3.5–5.1)
Sodium: 137 mmol/L (ref 135–145)
TOTAL PROTEIN: 6.6 g/dL (ref 6.5–8.1)

## 2018-07-19 LAB — CBC WITH DIFFERENTIAL (CANCER CENTER ONLY)
Abs Immature Granulocytes: 0.49 10*3/uL — ABNORMAL HIGH (ref 0.00–0.07)
Basophils Absolute: 0.1 10*3/uL (ref 0.0–0.1)
Basophils Relative: 2 %
EOS PCT: 3 %
Eosinophils Absolute: 0.2 10*3/uL (ref 0.0–0.5)
HCT: 40.4 % (ref 39.0–52.0)
Hemoglobin: 13.8 g/dL (ref 13.0–17.0)
Immature Granulocytes: 6 %
Lymphocytes Relative: 16 %
Lymphs Abs: 1.3 10*3/uL (ref 0.7–4.0)
MCH: 30.9 pg (ref 26.0–34.0)
MCHC: 34.2 g/dL (ref 30.0–36.0)
MCV: 90.6 fL (ref 80.0–100.0)
MONO ABS: 0.8 10*3/uL (ref 0.1–1.0)
Monocytes Relative: 9 %
Neutro Abs: 5.4 10*3/uL (ref 1.7–7.7)
Neutrophils Relative %: 64 %
Platelet Count: 124 10*3/uL — ABNORMAL LOW (ref 150–400)
RBC: 4.46 MIL/uL (ref 4.22–5.81)
RDW: 15.3 % (ref 11.5–15.5)
WBC Count: 8.3 10*3/uL (ref 4.0–10.5)
nRBC: 0 % (ref 0.0–0.2)

## 2018-07-19 LAB — CEA (IN HOUSE-CHCC): CEA (CHCC-In House): 3.93 ng/mL (ref 0.00–5.00)

## 2018-07-19 LAB — GLUCOSE, CAPILLARY: Glucose-Capillary: 247 mg/dL — ABNORMAL HIGH (ref 70–99)

## 2018-07-19 MED ORDER — SODIUM CHLORIDE 0.9 % IV SOLN
Freq: Once | INTRAVENOUS | Status: AC
  Administered 2018-07-19: 12:00:00 via INTRAVENOUS
  Filled 2018-07-19: qty 5

## 2018-07-19 MED ORDER — PALONOSETRON HCL INJECTION 0.25 MG/5ML
INTRAVENOUS | Status: AC
  Filled 2018-07-19: qty 5

## 2018-07-19 MED ORDER — LEUCOVORIN CALCIUM INJECTION 350 MG
400.0000 mg/m2 | Freq: Once | INTRAVENOUS | Status: AC
  Administered 2018-07-19: 768 mg via INTRAVENOUS
  Filled 2018-07-19: qty 38.4

## 2018-07-19 MED ORDER — PALONOSETRON HCL INJECTION 0.25 MG/5ML
0.2500 mg | Freq: Once | INTRAVENOUS | Status: AC
  Administered 2018-07-19: 0.25 mg via INTRAVENOUS

## 2018-07-19 MED ORDER — DEXTROSE 5 % IV SOLN
INTRAVENOUS | Status: DC
  Administered 2018-07-19: 12:00:00 via INTRAVENOUS
  Filled 2018-07-19: qty 250

## 2018-07-19 MED ORDER — DEXTROSE 5 % IV SOLN
Freq: Once | INTRAVENOUS | Status: AC
  Administered 2018-07-19: 11:00:00 via INTRAVENOUS
  Filled 2018-07-19: qty 250

## 2018-07-19 MED ORDER — OXALIPLATIN CHEMO INJECTION 100 MG/20ML
75.0000 mg/m2 | Freq: Once | INTRAVENOUS | Status: AC
  Administered 2018-07-19: 145 mg via INTRAVENOUS
  Filled 2018-07-19: qty 20

## 2018-07-19 MED ORDER — SODIUM CHLORIDE 0.9 % IV SOLN
2400.0000 mg/m2 | INTRAVENOUS | Status: DC
  Administered 2018-07-19: 4600 mg via INTRAVENOUS
  Filled 2018-07-19: qty 92

## 2018-07-19 NOTE — Patient Instructions (Addendum)
[facility] Discharge Instructions for Patients Receiving Chemotherapy  Today you received the following chemotherapy agents: Oxaliplatin, Leucovorin, and 5FU.  To help prevent nausea and vomiting after your treatment, we encourage you to take your nausea medication as directed.   If you develop nausea and vomiting that is not controlled by your nausea medication, call the clinic.   BELOW ARE SYMPTOMS THAT SHOULD BE REPORTED IMMEDIATELY:  *FEVER GREATER THAN 100.5 F  *CHILLS WITH OR WITHOUT FEVER  NAUSEA AND VOMITING THAT IS NOT CONTROLLED WITH YOUR NAUSEA MEDICATION  *UNUSUAL SHORTNESS OF BREATH  *UNUSUAL BRUISING OR BLEEDING  TENDERNESS IN MOUTH AND THROAT WITH OR WITHOUT PRESENCE OF ULCERS  *URINARY PROBLEMS  *BOWEL PROBLEMS  UNUSUAL RASH Items with * indicate a potential emergency and should be followed up as soon as possible.  Feel free to call the clinic should you have any questions or concerns. The clinic phone number is [phone].  Please show the CHEMO ALERT CARD at check-in to the Emergency Department and triage nurse.    

## 2018-07-19 NOTE — Progress Notes (Signed)
Acute onset of nausea and wretching. Symptoms subsided within 10 minutes.  Med reconciliation-Pt stated he did not take his Metformin this am . Capillary glucose 247 . Dr Burr Medico notified . Pt stated he was not hungry and did not feel like eating.

## 2018-07-19 NOTE — Progress Notes (Signed)
Brief nutrition follow-up completed with patient during infusion for cecal cancer. Patient was sleeping soundly but did awaken to name called. He reports that he did have nausea and retching after getting up to go to the bathroom. Reports that he feels fine now. He has not been having other nutrition impact symptoms and this is his first case of actual nausea. Weight decreased slightly and documented as 168 pounds December 16 down from 169.2 pounds December 2.  Nutrition diagnosis: Unintended weight loss continues.  Intervention: Educated patient to continue strategies for adequate calorie and protein intake. Encouraged strategies for improving nausea if this happens again. Teach back method used.  Monitoring, evaluation, goals: Patient will tolerate adequate calories and protein to minimize weight loss.  Next visit: Monday, January 13 during infusion.  **Disclaimer: This note was dictated with voice recognition software. Similar sounding words can inadvertently be transcribed and this note may contain transcription errors which may not have been corrected upon publication of note.**

## 2018-07-20 ENCOUNTER — Encounter: Payer: Self-pay | Admitting: Family Medicine

## 2018-07-20 ENCOUNTER — Ambulatory Visit (INDEPENDENT_AMBULATORY_CARE_PROVIDER_SITE_OTHER): Payer: Medicare Other | Admitting: Family Medicine

## 2018-07-20 ENCOUNTER — Other Ambulatory Visit: Payer: Self-pay

## 2018-07-20 VITALS — BP 110/56 | HR 60 | Temp 97.8°F | Ht 67.0 in | Wt 163.8 lb

## 2018-07-20 DIAGNOSIS — E869 Volume depletion, unspecified: Secondary | ICD-10-CM | POA: Diagnosis not present

## 2018-07-20 DIAGNOSIS — R5383 Other fatigue: Secondary | ICD-10-CM | POA: Diagnosis not present

## 2018-07-20 DIAGNOSIS — E1165 Type 2 diabetes mellitus with hyperglycemia: Secondary | ICD-10-CM | POA: Diagnosis not present

## 2018-07-20 LAB — POCT URINALYSIS DIP (MANUAL ENTRY)
Bilirubin, UA: NEGATIVE
Glucose, UA: 500 mg/dL — AB
Ketones, POC UA: NEGATIVE mg/dL
Leukocytes, UA: NEGATIVE
NITRITE UA: NEGATIVE
Protein Ur, POC: NEGATIVE mg/dL
Spec Grav, UA: 1.02 (ref 1.010–1.025)
Urobilinogen, UA: 0.2 E.U./dL
pH, UA: 5.5 (ref 5.0–8.0)

## 2018-07-20 LAB — GLUCOSE, POCT (MANUAL RESULT ENTRY): POC Glucose: 410 mg/dl — AB (ref 70–99)

## 2018-07-20 NOTE — Progress Notes (Signed)
Subjective:    Patient ID: William Oneill, male    DOB: [birth year], 72 y.o.   MRN: [id number]  HPI William Oneill is a 72 y.o. male Presents today for: Chief Complaint  Patient presents with  . Diabetes  . Fatigue   Here for follow-up.  History of cecal cancer, on chemotherapy.  I have been following him for diabetes with increased requirements of medication.  Initially had increased his glipizide but persistent elevated readings with higher doses.  He was continued on metformin and Januvia.  He was started on Lantus insulin in October.  Last seen November 14.  There was some confusion about insulin dosing from prior visit.  Was remaining on 10 units of Lantus.  Reported home readings of high 100s to low 200s.  Reading 182 in the office that day.  Lantus was increased to 12 units/day, continued same dose of Januvia and metformin.   Had chemo treatment yesterday. Plan to flush pump and take it out tomorrow, then next treatment 12/30, and 1/13 followed by more testing. Feeling fatigued and generally weak today - worse as the day has gone on. More fatigued. More than usual after chemo.  No fever.  No CP/dyspnea.  Home CBG readings 178, 205 2 nights ago. cbg yesterday 247. lantus 12 units last night. Misunderstood from last visit - stopped Januvia.  Not symptomatic low readings. Lowest reading at home 178.  Ate lunch today bite of fruit cake - corn, butter beans, jello salad. Not drinking fluids.  Last uop this afternoon before OV.  UOP 3 times today.  Slight lightheaded.   Lab Results  Component Value Date   HGBA1C 8.9 (A) 05/27/2018   HGBA1C 8.6 (H) 01/26/2018   HGBA1C 7.5 11/12/2017   Lab Results  Component Value Date   MICROALBUR 0.9 01/10/2016   [place] 90 11/12/2017   CREATININE 0.81 07/19/2018    Patient Active Problem List   Diagnosis Date Noted  . Port-A-Cath in place 06/21/2018  . 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 Generalized  weakness 04/11/2015  . Acute purulent bronchitis 04/11/2015  . Dehydration 04/11/2015  . Nocturia more than twice per night 01/16/2015  . Snoring 01/16/2015  . 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 Obese abdomen 01/16/2015  . Other fatigue 01/16/2015  . Type 2 diabetes mellitus without complication ([place]) 89/38/1017  . Insomnia    Past Medical History:  Diagnosis Date  . Anemia   . Cancer ([place])   . Colon cancer ([place])   . Diabetes mellitus without complication ([place])    type 2  . High cholesterol   . History of kidney stones   . Insomnia   . Pneumonia    x1   Past Surgical History:  Procedure Laterality Date  . COLON SURGERY     laparoscopic ascending colectomy  Dr. Barry Dienes 02-02-18  . COLONOSCOPY    . LAPAROSCOPIC PARTIAL COLECTOMY N/A 02/02/2018   Procedure: LAPAROSCOPIC ASCENDING COLECTOMY;  Surgeon: Stark Klein, MD;  Location: [facility];  Service: General;  Laterality: N/A;  . NO PAST SURGERIES    . PORTACATH PLACEMENT Left 03/03/2018   Procedure: INSERTION PORT-A-CATH;  Surgeon: Stark Klein, MD;  Location: [place];  Service: General;  Laterality: Left;  . TONSILLECTOMY     as a child   No Known Allergies Prior to Admission medications   Medication Sig Start Date End Date Taking? Authorizing Provider  acetaminophen (TYLENOL) 325 MG tablet  Take 2 tablets (650 mg total) by mouth every 6 (six) hours as needed. 02/06/18  Yes [name], MD  atorvastatin (LIPITOR) 10 MG tablet Take 1 tablet (10 mg total) by mouth daily. 11/12/17  Yes [name], MD  blood glucose meter kit and supplies Dispense based on patient and insurance preference. Use up to four times daily as directed. (FOR ICD-9 250.00, 250.01). 12/11/14  Yes [name], MD  ferrous sulfate 325 (65 FE) MG EC tablet Take 1 tablet (325 mg total) by mouth 3 (three) times daily with meals. 02/19/18  Yes [name], MD  glucose blood (ONE TOUCH ULTRA TEST) test strip USE AS  DIRECTED TO TEST UP TO 4 TIMES A DAY 12/07/17  Yes [name], MD  hydrOXYzine (ATARAX/VISTARIL) 25 MG tablet Take 0.5-1 tablets (12.5-25 mg total) by mouth at bedtime as needed (sleep). 03/01/18  Yes [name], MD  ibuprofen (ADVIL,MOTRIN) 600 MG tablet Take 1 tablet (600 mg total) by mouth every 6 (six) hours as needed (pain not controlled with tylenol). 02/06/18  Yes [name], MD  Insulin Glargine (LANTUS SOLOSTAR) 100 UNIT/ML Solostar Pen Inject 8 Units into the skin at bedtime. 05/27/18  Yes [name], MD  Insulin Pen Needle (PEN NEEDLES) 32G X 4 MM MISC 1 application by Does not apply route daily. 05/27/18  Yes [name], MD  Lancets (ACCU-CHEK SOFT TOUCH) lancets Use as instructed 05/27/18  Yes [name], MD  lidocaine-prilocaine (EMLA) cream Apply to affected area once 02/20/18  Yes [name], MD  metFORMIN (GLUCOPHAGE) 1000 MG tablet Take 1 tablet (1,000 mg total) by mouth 2 (two) times daily with a meal. 03/01/18  Yes [name], MD  Multiple Vitamin (MULTIVITAMIN WITH MINERALS) TABS tablet Take 1 tablet by mouth daily.   Yes [provider]  ondansetron (ZOFRAN ODT) 4 MG disintegrating tablet Take 1 tablet (4 mg total) by mouth every 8 (eight) hours as needed for nausea or vomiting. 05/01/18  Yes [name], PA-C  ondansetron (ZOFRAN) 8 MG tablet Take 1 tablet (8 mg total) by mouth 2 (two) times daily as needed for refractory nausea / vomiting. Start on day 3 after chemotherapy. 02/20/18  Yes [name], MD  prochlorperazine (COMPAZINE) 10 MG tablet Take 1 tablet (10 mg total) by mouth every 6 (six) hours as needed (Nausea or vomiting). 05/25/18  Yes [name], NP  sitaGLIPtin (JANUVIA) 100 MG tablet Take 1 tablet (100 mg total) by mouth daily. 03/01/18  Yes [name], MD   Social History   Socioeconomic History  . Marital status: Single    Spouse name: Not on file  . Number of children: Not on file  . Years of  education: Not on file  . Highest education level: Not on file  Occupational History  . Not on file  Social Needs  . Financial resource strain: Not on file  . Food insecurity:    Worry: Not on file    Inability: Not on file  . Transportation needs:    Medical: Not on file    Non-medical: Not on file  Tobacco Use  . Smoking status: Never Smoker  . Smokeless tobacco: Never Used  Substance and Sexual Activity  . Alcohol use: No    Alcohol/week: 0.0 standard drinks  . Drug use: No  . Sexual activity: Yes  Lifestyle  . Physical activity:    Days per week: Not on file  Minutes per session: Not on file  . Stress: Not on file  Relationships  . Social connections:    Talks on phone: Not on file    Gets together: Not on file    Attends religious service: Not on file    Active member of club or organization: Not on file    Attends meetings of clubs or organizations: Not on file    Relationship status: Not on file  . Intimate partner violence:    Fear of current or ex partner: Not on file    Emotionally abused: Not on file    Physically abused: Not on file    Forced sexual activity: Not on file  Other Topics Concern  . Not on file  Social History Narrative  . Not on file    Review of Systems  Constitutional: Negative for fatigue and fever.  Respiratory: Negative for shortness of breath.   Cardiovascular: Negative for chest pain.  Gastrointestinal: Negative for abdominal pain and vomiting.       Objective:   Physical Exam Vitals signs reviewed.  Constitutional:      Appearance: He is well-developed.  HENT:     Head: Normocephalic and atraumatic.  Eyes:     Pupils: Pupils are equal, round, and reactive to light.  Neck:     Vascular: No carotid bruit or JVD.  Cardiovascular:     Rate and Rhythm: Normal rate and regular rhythm.     Heart sounds: Normal heart sounds. No murmur.  Pulmonary:     Effort: Pulmonary effort is normal.     Breath sounds: Normal breath  sounds. No rales.  Skin:    General: Skin is warm and dry.  Neurological:     Mental Status: He is alert and oriented to person, place, and time.    Vitals:   07/20/18 1455 07/20/18 1521  BP: (!) 140/55 (!) 110/56  Pulse: 60   Temp: 97.8 F (36.6 C)   TempSrc: Oral   SpO2: 98%   Weight: 163 lb 12.8 oz (74.3 kg)   Height: _0  (1.702 m)      Results for orders placed or performed in visit on 07/20/18  POCT glucose (manual entry)  Result Value Ref Range   POC Glucose 410 (A) 70 - 99 mg/dl   Vitals:   07/20/18 1455 07/20/18 1521  BP: (!) 140/55 (!) 110/56  Pulse: 60   Temp: 97.8 F (36.6 C)   TempSrc: Oral   SpO2: 98%   Weight: 163 lb 12.8 oz (74.3 kg)   Height: _1  (1.702 m)    6:08 PM 550 cc NS by IV.  Feels better than when he came in. Less fatigued.   Results for orders placed or performed in visit on 07/20/18  POCT glucose (manual entry)  Result Value Ref Range   POC Glucose 410 (A) 70 - 99 mg/dl  POCT urinalysis dipstick  Result Value Ref Range   Color, UA yellow yellow   Clarity, UA clear clear   Glucose, UA =500 (A) negative mg/dL   Bilirubin, UA negative negative   Ketones, POC UA negative negative mg/dL   Spec Grav, UA 1.020 1.010 - 1.025   Blood, UA trace-intact (A) negative   pH, UA 5.5 5.0 - 8.0   Protein Ur, POC negative negative mg/dL   Urobilinogen, UA 0.2 0.2 or 1.0 E.U./dL   Nitrite, UA Negative Negative   Leukocytes, UA Negative Negative   Over 40 minutes face to face  care with repeat eval after initial exam on IV.    Assessment & Plan:  William Oneill is a 72 y.o. male Type 2 diabetes mellitus with hyperglycemia, without long-term current use of insulin ([place]) - Plan: POCT glucose (manual entry), Basic metabolic panel  Fatigue, unspecified type - Plan: POCT urinalysis dipstick  Volume depletion - Plan: Insert peripheral IV, PR NORMAL SALINE SOLUTION INFUS   Suspect some of fatigue d/t relative volume depletion, in addition to  chemo yesterday. Improved with 550cc NS as above.  Hyperglycemic, but no ketonuria.   - restart Januvia  - increase lantus to 15 units per day.   - continue metformin same dose for now  - plans to advise me of home readings next few weeks with recheck in approx 3 weeks.   - ER precautions overnight if any worsening  No orders of the defined types were placed in this encounter.  Patient Instructions   I'm glad to hear you are feeling better with the IV fluids.  Blood sugar was high in the office today but I expect that to improve with taking your insulin.   Ok to restart Januvia.    Increase insulin to 15 units per night for right now and follow-up with me in the next 2 weeks.   If any worsening fatigue or new/worsening symptoms tonight proceed to the emergency room.   If you have lab work done today you will be contacted with your lab results within the next 2 weeks.  If you have not heard from [place] then please contact us. The fastest way to get your results is to register for My Chart.   IF you received an x-ray today, you will receive an invoice from [facility]. Please contact [facility] at [phone] with questions or concerns regarding your invoice.   IF you received labwork today, you will receive an invoice from [facility]. Please contact [facility] at [phone] with questions or concerns regarding your invoice.   Our billing staff will not be able to assist you with questions regarding bills from these companies.  You will be contacted with the lab results as soon as they are available. The fastest way to get your results is to activate your My Chart account. Instructions are located on the last page of this paperwork. If you have not heard from [place] regarding the results in 2 weeks, please contact this office.       Signed,   [name], MD [facility].  07/20/18 6:19 PM

## 2018-07-20 NOTE — Patient Instructions (Addendum)
I'm glad to hear you are feeling better with the IV fluids.  Blood sugar was high in the office today but I expect that to improve with taking your insulin.   Ok to restart Januvia.    Increase insulin to 15 units per night for right now and follow-up with me in the next 2 weeks.   If any worsening fatigue or new/worsening symptoms tonight proceed to the emergency room.   If you have lab work done today you will be contacted with your lab results within the next 2 weeks.  If you have not heard from [place] then please contact us. The fastest way to get your results is to register for My Chart.   IF you received an x-ray today, you will receive an invoice from [facility]. Please contact [facility] at [phone] with questions or concerns regarding your invoice.   IF you received labwork today, you will receive an invoice from [place]. Please contact [facility] at [phone] with questions or concerns regarding your invoice.   Our billing staff will not be able to assist you with questions regarding bills from these companies.  You will be contacted with the lab results as soon as they are available. The fastest way to get your results is to activate your My Chart account. Instructions are located on the last page of this paperwork. If you have not heard from [place] regarding the results in 2 weeks, please contact this office.

## 2018-07-21 ENCOUNTER — Inpatient Hospital Stay: Payer: Medicare Other

## 2018-07-21 VITALS — BP 147/68 | HR 50 | Temp 97.6°F | Resp 18

## 2018-07-21 DIAGNOSIS — Z5111 Encounter for antineoplastic chemotherapy: Secondary | ICD-10-CM | POA: Diagnosis not present

## 2018-07-21 DIAGNOSIS — C180 Malignant neoplasm of cecum: Secondary | ICD-10-CM

## 2018-07-21 LAB — BASIC METABOLIC PANEL
BUN/Creatinine Ratio: 26 — ABNORMAL HIGH (ref 10–24)
BUN: 24 mg/dL (ref 8–27)
CO2: 20 mmol/L (ref 20–29)
CREATININE: 0.94 mg/dL (ref 0.76–1.27)
Calcium: 9.4 mg/dL (ref 8.6–10.2)
Chloride: 101 mmol/L (ref 96–106)
GFR calc Af Amer: 94 mL/min/{1.73_m2} (ref >59)
GFR, EST NON AFRICAN AMERICAN: 81 mL/min/{1.73_m2} (ref >59)
Glucose: 361 mg/dL — ABNORMAL HIGH (ref 65–99)
Potassium: 4.5 mmol/L (ref 3.5–5.2)
Sodium: 140 mmol/L (ref 134–144)

## 2018-07-21 MED ORDER — SODIUM CHLORIDE 0.9% FLUSH
10.0000 mL | INTRAVENOUS | Status: DC | PRN
  Administered 2018-07-21: 10 mL
  Filled 2018-07-21: qty 10

## 2018-07-21 MED ORDER — PEGFILGRASTIM-CBQV 6 MG/0.6ML ~~LOC~~ SOSY
6.0000 mg | PREFILLED_SYRINGE | Freq: Once | SUBCUTANEOUS | Status: AC
  Administered 2018-07-21: 6 mg via SUBCUTANEOUS

## 2018-07-21 MED ORDER — PEGFILGRASTIM-CBQV 6 MG/0.6ML ~~LOC~~ SOSY
PREFILLED_SYRINGE | SUBCUTANEOUS | Status: AC
  Filled 2018-07-21: qty 0.6

## 2018-07-21 MED ORDER — HEPARIN SOD (PORK) LOCK FLUSH 100 UNIT/ML IV SOLN
500.0000 [IU] | Freq: Once | INTRAVENOUS | Status: AC | PRN
  Administered 2018-07-21: 500 [IU]
  Filled 2018-07-21: qty 5

## 2018-07-21 NOTE — Patient Instructions (Signed)

## 2018-07-30 NOTE — Progress Notes (Signed)
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 Benson Norway 12/17/17  IMPRESSION -An infiltrative sessile and ulcerated non obstructing large mass in the cecum. This was noted to be in the cecal cap and was partially circumferential and 2cm in length. It was not noted to be bleeding.  -There was a 2 sessile polyps that were very close that were 4 to 37m that were not biopsies.  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  -The cecal mass was a poorly differentiated invasive adenocarcinoma.  [facility] [place]  Accession number is [id number]    12/17/2017 Imaging    CT CAP W Contrast 12/17/17  IMPRESSION: 1. Focal area of abnormal enhancement involving the ascending colon may represent mass discovered on recent colonoscopy. 2. No specific findings identified to suggest metastatic disease. 3. 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 Nonspecific. 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 4. Nonspecific 3 mm right upper lobe lung nodule is noted. No follow-up needed if patient is low-risk. Non-contrast chest CT can be considered in 12 months if patient is high-risk. This recommendation follows the consensus statement: Guidelines for Management of Incidental Pulmonary Nodules Detected on CT Images: From the Fleischner Society 2017; Radiology 2017; 284:228-243. 5. 7 mm low-density structure in segment 8 of the liver is too small to characterize. 6.  Aortic Atherosclerosis (ICD10-I70.0). 7. Gallstones.    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 BBarry Dieneson 02/02/18      02/02/2018 Pathology Results    Diagnosis 02/02/18  Colon, segmental resection for tumor, right - INVASIVE COLORECTAL ADENOCARCINOMA, TWO TUMORS, 5.5 AND 1.8 CM. - LARGER, 5.5 CM TUMOR INVOLVES VISCERAL PERITONEUM - SMALLER, 1.8 CM TUMOR INVOLVES SUBMUCOSA. - ONE EXTRAMURAL SATELLITE TUMOR NODULE. - METASTATIC CARCINOMA IN ONE OF TWENTY-NINE LYMPH NODES (1/29). - TWO TUBULAR ADENOMAS, 0.6 AND 0.8 CM. - BENIGN APPENDIX WITH FIBROSIS OF THE LUMEN.    02/02/2018 Cancer Staging    Staging form: Colon and Rectum, AJCC 8th Edition - Pathologic stage from 02/02/2018: Stage IIIB (pT4a, pN1a, cM0) - Signed by [name] MD on 02/20/2018    03/08/2018 -  Chemotherapy    Adjuvnat chemo FOLFOX every 2 weeks for 3-6 months starting 03/08/18. Udynaca added at cycle 3 due to neutropenia.       CURRENT THERAPY:  FOLFOX 2q weeks for 3-6 months. Started 03/08/2018  INTERVAL HISTORY:  William Oneill  is here for a follow up and treatment. He presents to the infusion room by himself. He notes he is doing well overall. He notes 2 weeks ago he did have numbness and tingling that resolved after a few days. He is overall able to maintain his weight.    REVIEW OF SYSTEMS:    Constitutional: Denies fevers, chills or abnormal weight loss Eyes: Denies blurriness of vision Ears, nose, mouth, throat, and face: Denies mucositis or sore throat Respiratory: Denies cough, dyspnea or wheezes Cardiovascular: Denies palpitation, chest discomfort or lower extremity swelling Gastrointestinal:  Denies nausea, heartburn or change in bowel habits Skin: Denies abnormal skin rashes Lymphatics: Denies new lymphadenopathy or easy bruising Neurological:Denies numbness, tingling or new weaknesses Behavioral/Psych: Mood is stable, no new changes  All other systems were reviewed with the patient and are negative.  MEDICAL HISTORY:  Past Medical History:  Diagnosis Date  . Anemia   . Cancer ([place])   . Colon cancer ([place])   . Diabetes mellitus without complication ([place])    type 2  . High cholesterol   . History of kidney stones   . Insomnia   . Pneumonia    x1    SURGICAL HISTORY: Past Surgical History:  Procedure Laterality Date  . COLON SURGERY     laparoscopic ascending colectomy  Dr. Barry Dienes 02-02-18  . COLONOSCOPY    . LAPAROSCOPIC PARTIAL COLECTOMY N/A 02/02/2018   Procedure: LAPAROSCOPIC ASCENDING COLECTOMY;  Surgeon: Stark Klein, MD;  Location: [facility];  Service: General;  Laterality: N/A;  . NO PAST SURGERIES    . PORTACATH PLACEMENT Left 03/03/2018   Procedure: INSERTION PORT-A-CATH;  Surgeon: Stark Klein, MD;  Location: [facility];  Service: General;  Laterality: Left;  . TONSILLECTOMY     as a child    I have reviewed the social history and family history with the patient and they are unchanged from previous note.  ALLERGIES:  has No Known Allergies.  MEDICATIONS:  Current Outpatient Medications  Medication Sig Dispense Refill  . acetaminophen (TYLENOL) 325 MG tablet Take 2 tablets (650 mg total) by mouth every 6 (six) hours as needed.    Marland Kitchen atorvastatin (LIPITOR) 10 MG tablet Take 1 tablet (10 mg total) by mouth daily. 90 tablet 1  . blood  glucose meter kit and supplies Dispense based on patient and insurance preference. Use up to four times daily as directed. (FOR ICD-9 250.00, 250.01). 1 each 0  . ferrous sulfate 325 (65 FE) MG EC tablet Take 1 tablet (325 mg total) by mouth 3 (three) times daily with meals. 30 tablet 2  . glucose blood (ONE TOUCH ULTRA TEST) test strip USE AS DIRECTED TO TEST UP TO 4 TIMES A DAY 100 each 6  . hydrOXYzine (ATARAX/VISTARIL) 25 MG tablet Take 0.5-1 tablets (12.5-25 mg total) by mouth at bedtime as needed (sleep). 30 tablet 0  . ibuprofen (ADVIL,MOTRIN) 600 MG tablet Take 1 tablet (600 mg total) by mouth every 6 (six) hours as needed (pain not controlled with tylenol). 30 tablet 0  . Insulin Glargine (LANTUS SOLOSTAR) 100 UNIT/ML Solostar Pen Inject 8 Units into the skin at bedtime. 5 pen PRN  . Insulin Pen Needle (PEN NEEDLES) 32G X 4 MM MISC 1 application by Does not apply route daily. 90 each 3  . Lancets (ACCU-CHEK SOFT TOUCH) lancets Use as instructed 100 each 12  . lidocaine-prilocaine (EMLA) cream Apply to affected area once 30 g 3  . metFORMIN (GLUCOPHAGE) 1000 MG  tablet Take 1 tablet (1,000 mg total) by mouth 2 (two) times daily with a meal. 180 tablet 1  . Multiple Vitamin (MULTIVITAMIN WITH MINERALS) TABS tablet Take 1 tablet by mouth daily.    . ondansetron (ZOFRAN ODT) 4 MG disintegrating tablet Take 1 tablet (4 mg total) by mouth every 8 (eight) hours as needed for nausea or vomiting. 6 tablet 0  . ondansetron (ZOFRAN) 8 MG tablet Take 1 tablet (8 mg total) by mouth 2 (two) times daily as needed for refractory nausea / vomiting. Start on day 3 after chemotherapy. 30 tablet 1  . prochlorperazine (COMPAZINE) 10 MG tablet Take 1 tablet (10 mg total) by mouth every 6 (six) hours as needed (Nausea or vomiting). 30 tablet 1  . sitaGLIPtin (JANUVIA) 100 MG tablet Take 1 tablet (100 mg total) by mouth daily. 90 tablet 1   No current facility-administered medications for this visit.     Facility-Administered Medications Ordered in Other Visits  Medication Dose Route Frequency Provider Last Rate Last Dose  . fluorouracil (ADRUCIL) 4,600 mg in sodium chloride 0.9 % 58 mL chemo infusion  2,400 mg/m2 (Treatment Plan Recorded) Intravenous 1 day or 1 dose [name], MD      . leucovorin 768 mg in dextrose 5 % 250 mL infusion  400 mg/m2 (Treatment Plan Recorded) Intravenous Once [name], MD      . oxaliplatin (ELOXATIN) 145 mg in dextrose 5 % 500 mL chemo infusion  75 mg/m2 (Treatment Plan Recorded) Intravenous Once [name], MD        PHYSICAL EXAMINATION: ECOG PERFORMANCE STATUS: 1 - Symptomatic but completely ambulatory  There were no vitals filed for this visit. There were no vitals filed for this visit.  GENERAL:alert, no distress and comfortable SKIN: skin color, texture, turgor are normal, no rashes or significant lesions EYES: normal, Conjunctiva are pink and non-injected, sclera clear OROPHARYNX:no exudate, no erythema and lips, buccal mucosa, and tongue normal  NECK: supple, thyroid normal size, non-tender, without nodularity LYMPH:  no palpable lymphadenopathy in the cervical, axillary or inguinal LUNGS: clear to auscultation and percussion with normal breathing effort HEART: regular rate & rhythm and no murmurs and no lower extremity edema ABDOMEN:abdomen soft, non-tender and normal bowel sounds Musculoskeletal:no cyanosis of digits and no clubbing  NEURO: alert & oriented x 3 with fluent speech, no focal motor/sensory deficits  LABORATORY DATA:  I have reviewed the data as listed CBC Latest Ref Rng & Units 08/02/2018 07/19/2018 07/05/2018  WBC 4.0 - 10.5 K/uL 7.8 8.3 8.9  Hemoglobin 13.0 - 17.0 g/dL 13.2 13.8 13.6  Hematocrit 39.0 - 52.0 % 39.0 40.4 40.4  Platelets 150 - 400 K/uL 117(L) 124(L) 125(L)     CMP Latest Ref Rng & Units 08/02/2018 07/20/2018 07/19/2018  Glucose 70 - 99 mg/dL 191(H) 361(H) 295(H)  BUN 8 - 23 mg/dL '12 24 13  '$ Creatinine 0.61  - 1.24 mg/dL 0.74 0.94 0.81  Sodium 135 - 145 mmol/L 139 140 137  Potassium 3.5 - 5.1 mmol/L 4.0 4.5 4.3  Chloride 98 - 111 mmol/L 107 101 103  CO2 22 - 32 mmol/L '24 20 25  '$ Calcium 8.9 - 10.3 mg/dL 8.9 9.4 9.2  Total Protein 6.5 - 8.1 g/dL 6.2(L) - 6.6  Total Bilirubin 0.3 - 1.2 mg/dL 1.2 - 1.3(H)  Alkaline Phos 38 - 126 U/L 249(H) - 259(H)  AST 15 - 41 U/L 39 - 39  ALT 0 - 44 U/L 30 - 37  RADIOGRAPHIC STUDIES: I have personally reviewed the radiological images as listed and agreed with the findings in the report. No results found.   ASSESSMENT & PLAN:  SOSTENES KAUFFMANN is a 72 y.o. male with   1. Cecal Cancer,adenocarcinoma, grade 2, pT4aN1aM0,stage IIIB, MSS -Diagnosed 02/2018.  Status post hemicolectomy with negative surgical margins  -He is currently on adjuvant chemotherapy FOLFOX q2 weeks, plan for 6 months, due to high risk of recurrence. Tolerating well with mild fatigue and weakness.  Nausea much improved with IV Emend, no significant neuropathy. -Labs reviewed, Glucose at 191, total protein at 6.2, Alk phos at 249, PLt at 117K. Overall adequate to proceed with FOLFOX today he has 2 more cycle remaining.  -Plan to repeat a staging CT scan after he completes chemo. -F/u in 2 weeks.   2. IDA -Received IV Feraheme x2 on 03/24/2018 and 04/13/2018, anemia resolved.  3. DM -Continue metformin, lantus insulin at bedtime, and januvia. -f/u with PCP -Glucose at 191 today (08/02/18)  4. 64m Liver lesion -Plan to repeat CT upon completion of chemo  5. Chemotherapy related fatigue and weakness -Started after cycle 4. Cycle 5 required dose reduction and delay -He tries to stay active and awake. His fatigue is improving.   Plan  -Lab reviewed, adequate for treatment, will proceed FOLFOX today and continue every 2 weeks -Lab, flush, FOLFOX and F/u in 2 weeks    No problem-specific Assessment & Plan notes found for this encounter.   No orders of the defined types  were placed in this encounter.  All questions were answered. The patient knows to call the clinic with any problems, questions or concerns. No barriers to learning was detected. I spent 15 minutes counseling the patient face to face. The total time spent in the appointment was 20 minutes and more than 50% was on counseling and review of test results     YTruitt Merle MD 08/02/2018   I, AJoslyn Devon am acting as scribe for YTruitt Merle MD.   I have reviewed the above documentation for accuracy and completeness, and I agree with the above.

## 2018-08-02 ENCOUNTER — Inpatient Hospital Stay (HOSPITAL_BASED_OUTPATIENT_CLINIC_OR_DEPARTMENT_OTHER): Payer: Medicare Other | Admitting: Hematology

## 2018-08-02 ENCOUNTER — Inpatient Hospital Stay: Payer: Medicare Other

## 2018-08-02 VITALS — BP 126/67 | HR 63 | Temp 97.8°F | Resp 18

## 2018-08-02 DIAGNOSIS — R531 Weakness: Secondary | ICD-10-CM

## 2018-08-02 DIAGNOSIS — K769 Liver disease, unspecified: Secondary | ICD-10-CM

## 2018-08-02 DIAGNOSIS — E119 Type 2 diabetes mellitus without complications: Secondary | ICD-10-CM | POA: Diagnosis not present

## 2018-08-02 DIAGNOSIS — D509 Iron deficiency anemia, unspecified: Secondary | ICD-10-CM | POA: Diagnosis not present

## 2018-08-02 DIAGNOSIS — C180 Malignant neoplasm of cecum: Secondary | ICD-10-CM

## 2018-08-02 DIAGNOSIS — Z95828 Presence of other vascular implants and grafts: Secondary | ICD-10-CM

## 2018-08-02 DIAGNOSIS — Z5111 Encounter for antineoplastic chemotherapy: Secondary | ICD-10-CM | POA: Diagnosis not present

## 2018-08-02 DIAGNOSIS — R5383 Other fatigue: Secondary | ICD-10-CM

## 2018-08-02 LAB — CBC WITH DIFFERENTIAL (CANCER CENTER ONLY)
Abs Immature Granulocytes: 0.29 10*3/uL — ABNORMAL HIGH (ref 0.00–0.07)
Basophils Absolute: 0.1 10*3/uL (ref 0.0–0.1)
Basophils Relative: 1 %
Eosinophils Absolute: 0.2 10*3/uL (ref 0.0–0.5)
Eosinophils Relative: 3 %
HCT: 39 % (ref 39.0–52.0)
Hemoglobin: 13.2 g/dL (ref 13.0–17.0)
IMMATURE GRANULOCYTES: 4 %
Lymphocytes Relative: 16 %
Lymphs Abs: 1.3 10*3/uL (ref 0.7–4.0)
MCH: 31.4 pg (ref 26.0–34.0)
MCHC: 33.8 g/dL (ref 30.0–36.0)
MCV: 92.9 fL (ref 80.0–100.0)
Monocytes Absolute: 0.8 10*3/uL (ref 0.1–1.0)
Monocytes Relative: 10 %
NEUTROS PCT: 66 %
Neutro Abs: 5.1 10*3/uL (ref 1.7–7.7)
Platelet Count: 117 10*3/uL — ABNORMAL LOW (ref 150–400)
RBC: 4.2 MIL/uL — ABNORMAL LOW (ref 4.22–5.81)
RDW: 14.6 % (ref 11.5–15.5)
WBC Count: 7.8 10*3/uL (ref 4.0–10.5)
nRBC: 0 % (ref 0.0–0.2)

## 2018-08-02 LAB — CMP (CANCER CENTER ONLY)
ALT: 30 U/L (ref 0–44)
AST: 39 U/L (ref 15–41)
Albumin: 3.4 g/dL — ABNORMAL LOW (ref 3.5–5.0)
Alkaline Phosphatase: 249 U/L — ABNORMAL HIGH (ref 38–126)
Anion gap: 8 (ref 5–15)
BUN: 12 mg/dL (ref 8–23)
CALCIUM: 8.9 mg/dL (ref 8.9–10.3)
CHLORIDE: 107 mmol/L (ref 98–111)
CO2: 24 mmol/L (ref 22–32)
Creatinine: 0.74 mg/dL (ref 0.61–1.24)
GFR, Est AFR Am: >60 mL/min (ref >60)
GFR, Estimated: >60 mL/min (ref >60)
Glucose, Bld: 191 mg/dL — ABNORMAL HIGH (ref 70–99)
Potassium: 4 mmol/L (ref 3.5–5.1)
Sodium: 139 mmol/L (ref 135–145)
Total Bilirubin: 1.2 mg/dL (ref 0.3–1.2)
Total Protein: 6.2 g/dL — ABNORMAL LOW (ref 6.5–8.1)

## 2018-08-02 MED ORDER — SODIUM CHLORIDE 0.9% FLUSH
10.0000 mL | INTRAVENOUS | Status: DC | PRN
  Administered 2018-08-02: 10 mL via INTRAVENOUS
  Filled 2018-08-02: qty 10

## 2018-08-02 MED ORDER — PALONOSETRON HCL INJECTION 0.25 MG/5ML
0.2500 mg | Freq: Once | INTRAVENOUS | Status: AC
  Administered 2018-08-02: 0.25 mg via INTRAVENOUS

## 2018-08-02 MED ORDER — SODIUM CHLORIDE 0.9 % IV SOLN
2400.0000 mg/m2 | INTRAVENOUS | Status: DC
  Administered 2018-08-02: 4600 mg via INTRAVENOUS
  Filled 2018-08-02: qty 92

## 2018-08-02 MED ORDER — DEXTROSE 5 % IV SOLN
Freq: Once | INTRAVENOUS | Status: AC
  Administered 2018-08-02: 13:00:00 via INTRAVENOUS
  Filled 2018-08-02: qty 250

## 2018-08-02 MED ORDER — PALONOSETRON HCL INJECTION 0.25 MG/5ML
INTRAVENOUS | Status: AC
  Filled 2018-08-02: qty 5

## 2018-08-02 MED ORDER — LEUCOVORIN CALCIUM INJECTION 350 MG
400.0000 mg/m2 | Freq: Once | INTRAVENOUS | Status: AC
  Administered 2018-08-02: 768 mg via INTRAVENOUS
  Filled 2018-08-02: qty 38.4

## 2018-08-02 MED ORDER — SODIUM CHLORIDE 0.9 % IV SOLN
Freq: Once | INTRAVENOUS | Status: AC
  Administered 2018-08-02: 14:00:00 via INTRAVENOUS
  Filled 2018-08-02: qty 5

## 2018-08-02 MED ORDER — OXALIPLATIN CHEMO INJECTION 100 MG/20ML
75.0000 mg/m2 | Freq: Once | INTRAVENOUS | Status: AC
  Administered 2018-08-02: 145 mg via INTRAVENOUS
  Filled 2018-08-02: qty 20

## 2018-08-03 ENCOUNTER — Encounter: Payer: Self-pay | Admitting: Hematology

## 2018-08-03 ENCOUNTER — Telehealth: Payer: Self-pay | Admitting: Hematology

## 2018-08-03 NOTE — Telephone Encounter (Signed)
No los per 12/30. °

## 2018-08-04 ENCOUNTER — Inpatient Hospital Stay: Payer: Medicare Other | Attending: Hematology

## 2018-08-04 VITALS — BP 133/72 | HR 62 | Temp 97.9°F | Resp 17

## 2018-08-04 DIAGNOSIS — K769 Liver disease, unspecified: Secondary | ICD-10-CM | POA: Insufficient documentation

## 2018-08-04 DIAGNOSIS — D6959 Other secondary thrombocytopenia: Secondary | ICD-10-CM | POA: Diagnosis not present

## 2018-08-04 DIAGNOSIS — C180 Malignant neoplasm of cecum: Secondary | ICD-10-CM | POA: Insufficient documentation

## 2018-08-04 DIAGNOSIS — E119 Type 2 diabetes mellitus without complications: Secondary | ICD-10-CM | POA: Insufficient documentation

## 2018-08-04 DIAGNOSIS — Z5111 Encounter for antineoplastic chemotherapy: Secondary | ICD-10-CM | POA: Diagnosis present

## 2018-08-04 DIAGNOSIS — D509 Iron deficiency anemia, unspecified: Secondary | ICD-10-CM | POA: Insufficient documentation

## 2018-08-04 DIAGNOSIS — Z452 Encounter for adjustment and management of vascular access device: Secondary | ICD-10-CM | POA: Diagnosis not present

## 2018-08-04 DIAGNOSIS — Z5189 Encounter for other specified aftercare: Secondary | ICD-10-CM | POA: Diagnosis not present

## 2018-08-04 MED ORDER — PEGFILGRASTIM-CBQV 6 MG/0.6ML ~~LOC~~ SOSY
6.0000 mg | PREFILLED_SYRINGE | Freq: Once | SUBCUTANEOUS | Status: AC
  Administered 2018-08-04: 6 mg via SUBCUTANEOUS

## 2018-08-04 MED ORDER — HEPARIN SOD (PORK) LOCK FLUSH 100 UNIT/ML IV SOLN
500.0000 [IU] | Freq: Once | INTRAVENOUS | Status: AC | PRN
  Administered 2018-08-04: 500 [IU]
  Filled 2018-08-04: qty 5

## 2018-08-04 MED ORDER — SODIUM CHLORIDE 0.9% FLUSH
10.0000 mL | INTRAVENOUS | Status: DC | PRN
  Administered 2018-08-04: 10 mL
  Filled 2018-08-04: qty 10

## 2018-08-04 MED ORDER — PEGFILGRASTIM-CBQV 6 MG/0.6ML ~~LOC~~ SOSY
PREFILLED_SYRINGE | SUBCUTANEOUS | Status: AC
  Filled 2018-08-04: qty 0.6

## 2018-08-04 NOTE — Patient Instructions (Signed)
TAKE ONE CLARITIN 10MG  OR GENERIC LORATADINE 10MG  THE DAY BEFORE THE SHOT AND FOR 3 DAYS AFTER THE SHOT TOO.   Pegfilgrastim injection What is this medicine? PEGFILGRASTIM (PEG fil gra stim) is a long-acting granulocyte colony-stimulating factor that stimulates the growth of neutrophils, a type of white blood cell important in the body's fight against infection. It is used to reduce the incidence of fever and infection in patients with certain types of cancer who are receiving chemotherapy that affects the bone marrow, and to increase survival after being exposed to high doses of radiation. This medicine may be used for other purposes; ask your health care provider or pharmacist if you have questions. COMMON BRAND NAME(S): Domenic Moras, UDENYCA What should I tell my health care provider before I take this medicine? They need to know if you have any of these conditions: -kidney disease -latex allergy -ongoing radiation therapy -sickle cell disease -skin reactions to acrylic adhesives (On-Body Injector only) -an unusual or allergic reaction to pegfilgrastim, filgrastim, other medicines, foods, dyes, or preservatives -pregnant or trying to get pregnant -breast-feeding How should I use this medicine? This medicine is for injection under the skin. If you get this medicine at home, you will be taught how to prepare and give the pre-filled syringe or how to use the On-body Injector. Refer to the patient Instructions for Use for detailed instructions. Use exactly as directed. Tell your healthcare provider immediately if you suspect that the On-body Injector may not have performed as intended or if you suspect the use of the On-body Injector resulted in a missed or partial dose. It is important that you put your used needles and syringes in a special sharps container. Do not put them in a trash can. If you do not have a sharps container, call your pharmacist or healthcare provider to get  one. Talk to your pediatrician regarding the use of this medicine in children. While this drug may be prescribed for selected conditions, precautions do apply. Overdosage: If you think you have taken too much of this medicine contact a poison control center or emergency room at once. NOTE: This medicine is only for you. Do not share this medicine with others. What if I miss a dose? It is important not to miss your dose. Call your doctor or health care professional if you miss your dose. If you miss a dose due to an On-body Injector failure or leakage, a new dose should be administered as soon as possible using a single prefilled syringe for manual use. What may interact with this medicine? Interactions have not been studied. Give your health care provider a list of all the medicines, herbs, non-prescription drugs, or dietary supplements you use. Also tell them if you smoke, drink alcohol, or use illegal drugs. Some items may interact with your medicine. This list may not describe all possible interactions. Give your health care provider a list of all the medicines, herbs, non-prescription drugs, or dietary supplements you use. Also tell them if you smoke, drink alcohol, or use illegal drugs. Some items may interact with your medicine. What should I watch for while using this medicine? You may need blood work done while you are taking this medicine. If you are going to need a MRI, CT scan, or other procedure, tell your doctor that you are using this medicine (On-Body Injector only). What side effects may I notice from receiving this medicine? Side effects that you should report to your doctor or health care professional as soon  as possible: -allergic reactions like skin rash, itching or hives, swelling of the face, lips, or tongue -back pain -dizziness -fever -pain, redness, or irritation at site where injected -pinpoint red spots on the skin -red or dark-brown urine -shortness of breath or  breathing problems -stomach or side pain, or pain at the shoulder -swelling -tiredness -trouble passing urine or change in the amount of urine Side effects that usually do not require medical attention (report to your doctor or health care professional if they continue or are bothersome): -bone pain -muscle pain This list may not describe all possible side effects. Call your doctor for medical advice about side effects. You may report side effects to FDA at 1-800-FDA-1088. Where should I keep my medicine? Keep out of the reach of children. If you are using this medicine at home, you will be instructed on how to store it. Throw away any unused medicine after the expiration date on the label. NOTE: This sheet is a summary. It may not cover all possible information. If you have questions about this medicine, talk to your doctor, pharmacist, or health care provider.  2019 Elsevier/Gold Standard (2017-10-26 16:57:08)

## 2018-08-09 ENCOUNTER — Other Ambulatory Visit: Payer: Self-pay | Admitting: Family Medicine

## 2018-08-12 ENCOUNTER — Ambulatory Visit (INDEPENDENT_AMBULATORY_CARE_PROVIDER_SITE_OTHER): Payer: Medicare Other | Admitting: Family Medicine

## 2018-08-12 ENCOUNTER — Other Ambulatory Visit: Payer: Self-pay

## 2018-08-12 ENCOUNTER — Encounter: Payer: Self-pay | Admitting: Family Medicine

## 2018-08-12 VITALS — BP 118/64 | HR 60 | Temp 98.4°F | Ht 66.0 in | Wt 160.3 lb

## 2018-08-12 DIAGNOSIS — IMO0001 Reserved for inherently not codable concepts without codable children: Secondary | ICD-10-CM

## 2018-08-12 DIAGNOSIS — I491 Atrial premature depolarization: Secondary | ICD-10-CM

## 2018-08-12 DIAGNOSIS — E1165 Type 2 diabetes mellitus with hyperglycemia: Secondary | ICD-10-CM | POA: Diagnosis not present

## 2018-08-12 DIAGNOSIS — E119 Type 2 diabetes mellitus without complications: Secondary | ICD-10-CM

## 2018-08-12 DIAGNOSIS — I499 Cardiac arrhythmia, unspecified: Secondary | ICD-10-CM

## 2018-08-12 LAB — POCT URINALYSIS DIP (MANUAL ENTRY)
Bilirubin, UA: NEGATIVE
Glucose, UA: 100 mg/dL — AB
Ketones, POC UA: NEGATIVE mg/dL
LEUKOCYTES UA: NEGATIVE
Nitrite, UA: NEGATIVE
Protein Ur, POC: NEGATIVE mg/dL
Spec Grav, UA: 1.02 (ref 1.010–1.025)
Urobilinogen, UA: 1 E.U./dL
pH, UA: 5.5 (ref 5.0–8.0)

## 2018-08-12 LAB — GLUCOSE, POCT (MANUAL RESULT ENTRY): POC Glucose: 151 mg/dl — AB (ref 70–99)

## 2018-08-12 MED ORDER — PEN NEEDLES 32G X 4 MM MISC: 1.0000 "application " | Freq: Every day | 3 refills | Status: DC

## 2018-08-12 MED ORDER — ACCU-CHEK SOFT TOUCH LANCETS MISC: 12 refills | Status: AC

## 2018-08-12 MED ORDER — GLUCOSE BLOOD VI STRP: ORAL_STRIP | 6 refills | Status: DC

## 2018-08-12 MED ORDER — METFORMIN HCL 1000 MG PO TABS: 1000.0000 mg | ORAL_TABLET | Freq: Two times a day (BID) | ORAL | 1 refills | Status: DC

## 2018-08-12 MED ORDER — SITAGLIPTIN PHOSPHATE 100 MG PO TABS: 100.0000 mg | ORAL_TABLET | Freq: Every day | ORAL | 1 refills | Status: DC

## 2018-08-12 MED ORDER — INSULIN GLARGINE 100 UNIT/ML SOLOSTAR PEN: 17.0000 [IU] | PEN_INJECTOR | Freq: Every day | SUBCUTANEOUS | 3 refills | Status: DC

## 2018-08-12 NOTE — Patient Instructions (Addendum)
   Increase Lantus to 17 units/day.  Watch for any low blood sugar symptoms with this slight increase.  Overall the numbers do look much better.  No change in Januvia or metformin doses for now.  Recheck in 1 month for repeat A1c testing.   There were some extra heartbeats but not concerning on that EKG. I will check some other blood tests from today to make sure there is no secondary cause for that.  If you have any new heart palpitations, dizziness,  lightheadedness or other new symptoms,  be seen right away.   Return to the clinic or go to the nearest emergency room if any of your symptoms worsen or new symptoms occur.   If you have lab work done today you will be contacted with your lab results within the next 2 weeks.  If you have not heard from [place] then please contact us. The fastest way to get your results is to register for My Chart.   IF you received an x-ray today, you will receive an invoice from [facility]. Please contact [facility] at [phone] with questions or concerns regarding your invoice.   IF you received labwork today, you will receive an invoice from [facility]. Please contact [facility] at [phone] with questions or concerns regarding your invoice.   Our billing staff will not be able to assist you with questions regarding bills from these companies.  You will be contacted with the lab results as soon as they are available. The fastest way to get your results is to activate your My Chart account. Instructions are located on the last page of this paperwork. If you have not heard from [place] regarding the results in 2 weeks, please contact this office.

## 2018-08-12 NOTE — Progress Notes (Signed)
Subjective:    Patient ID: William Oneill, male    DOB: 09/14/1945, 73 y.o.   MRN: [id number]  HPI William Oneill is a 73 y.o. male Presents today for: Chief Complaint  Patient presents with  . Diabetes    F/u,   . Medication Refill    diabectic test strips   Diabetes: Last seen December 17.  Suspected fatigue with volume depletion from recent chemo at that time.  Treated with IV fluids in office with resolution of symptoms.  He was hyperglycemic at that time without ketonuria.  Glucose 410, normal CO2 on basic metabolic panel,  we restarted Januvia, increased Lantus to 15 units/day and continued metformin same dose.  Feeling better. Next chemo this Monday, then Jan 27th hopefully last treatment. Will have follow up testing subsequently.  Home readings: Since December 19th: 124 up to 273 which was on December 21.  Most recently over the past week ranging from 175-218. Some fasting, some at bedtime. No symptomatic lows.  Still on 15 units lantus.   No palpitations. Avoiding caffeine. No stimulant supplements. Staying hydrated.   Microalbumin: Optho, foot exam, pneumovax:  Lab Results  Component Value Date   HGBA1C 8.9 (A) 05/27/2018   HGBA1C 8.6 (H) 01/26/2018   HGBA1C 7.5 11/12/2017   Lab Results  Component Value Date   MICROALBUR 0.9 01/10/2016   [place] 90 11/12/2017   CREATININE 0.74 08/02/2018     Patient Active Problem List   Diagnosis Date Noted  . Port-A-Cath in place 06/21/2018  . 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 Generalized weakness 04/11/2015  . Acute purulent bronchitis 04/11/2015  . Dehydration 04/11/2015  . Nocturia more than twice per night 01/16/2015  . Snoring 01/16/2015  . 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 Obese abdomen 01/16/2015  . Other fatigue 01/16/2015  . Type 2 diabetes mellitus without complication ([place]) 91/63/8466  . Insomnia    Past Medical History:  Diagnosis Date  .  Anemia   . Cancer ([place])   . Colon cancer ([place])   . Diabetes mellitus without complication ([place])    type 2  . High cholesterol   . History of kidney stones   . Insomnia   . Pneumonia    x1   Past Surgical History:  Procedure Laterality Date  . COLON SURGERY     laparoscopic ascending colectomy  Dr. Barry Dienes 02-02-18  . COLONOSCOPY    . LAPAROSCOPIC PARTIAL COLECTOMY N/A 02/02/2018   Procedure: LAPAROSCOPIC ASCENDING COLECTOMY;  Surgeon: Stark Klein, MD;  Location: [facility];  Service: General;  Laterality: N/A;  . NO PAST SURGERIES    . PORTACATH PLACEMENT Left 03/03/2018   Procedure: INSERTION PORT-A-CATH;  Surgeon: Stark Klein, MD;  Location: [place];  Service: General;  Laterality: Left;  . TONSILLECTOMY     as a child   No Known Allergies Prior to Admission medications   Medication Sig Start Date End Date Taking? Authorizing Provider  acetaminophen (TYLENOL) 325 MG tablet Take 2 tablets (650 mg total) by mouth every 6 (six) hours as needed. 02/06/18  Yes [name], MD  atorvastatin (LIPITOR) 10 MG tablet Take 1 tablet (10 mg total) by mouth daily. 11/12/17  Yes [name], MD  blood glucose meter kit and supplies Dispense based on patient and insurance preference. Use up to four times daily as directed. (FOR ICD-9 250.00, 250.01). 12/11/14  Yes [name], MD  ferrous sulfate  325 (65 FE) MG EC tablet Take 1 tablet (325 mg total) by mouth 3 (three) times daily with meals. 02/19/18  Yes [name], MD  glucose blood (ONE TOUCH ULTRA TEST) test strip USE AS DIRECTED TO TEST UP TO 4 TIMES A DAY 12/07/17  Yes [name], MD  hydrOXYzine (ATARAX/VISTARIL) 25 MG tablet Take 0.5-1 tablets (12.5-25 mg total) by mouth at bedtime as needed (sleep). 03/01/18  Yes [name], MD  ibuprofen (ADVIL,MOTRIN) 600 MG tablet Take 1 tablet (600 mg total) by mouth every 6 (six) hours as needed (pain not controlled with tylenol). 02/06/18  Yes [name], MD    Insulin Glargine (LANTUS SOLOSTAR) 100 UNIT/ML Solostar Pen Inject 8 Units into the skin at bedtime. 05/27/18  Yes [name], MD  Insulin Pen Needle (PEN NEEDLES) 32G X 4 MM MISC 1 application by Does not apply route daily. 05/27/18  Yes [name], MD  Lancets (ACCU-CHEK SOFT TOUCH) lancets Use as instructed 05/27/18  Yes [name], MD  lidocaine-prilocaine (EMLA) cream Apply to affected area once 02/20/18  Yes [name], MD  metFORMIN (GLUCOPHAGE) 1000 MG tablet Take 1 tablet (1,000 mg total) by mouth 2 (two) times daily with a meal. 03/01/18  Yes [name], MD  Multiple Vitamin (MULTIVITAMIN WITH MINERALS) TABS tablet Take 1 tablet by mouth daily.   Yes [provider]  ondansetron (ZOFRAN ODT) 4 MG disintegrating tablet Take 1 tablet (4 mg total) by mouth every 8 (eight) hours as needed for nausea or vomiting. 05/01/18  Yes [name], PA-C  ondansetron (ZOFRAN) 8 MG tablet Take 1 tablet (8 mg total) by mouth 2 (two) times daily as needed for refractory nausea / vomiting. Start on day 3 after chemotherapy. 02/20/18  Yes [name], MD  prochlorperazine (COMPAZINE) 10 MG tablet Take 1 tablet (10 mg total) by mouth every 6 (six) hours as needed (Nausea or vomiting). 05/25/18  Yes [name], NP  sitaGLIPtin (JANUVIA) 100 MG tablet Take 1 tablet (100 mg total) by mouth daily. 03/01/18  Yes [name], MD   Social History   Socioeconomic History  . Marital status: Single    Spouse name: Not on file  . Number of children: Not on file  . Years of education: Not on file  . Highest education level: Not on file  Occupational History  . Not on file  Social Needs  . Financial resource strain: Not on file  . Food insecurity:    Worry: Not on file    Inability: Not on file  . Transportation needs:    Medical: Not on file    Non-medical: Not on file  Tobacco Use  . Smoking status: Never Smoker  . Smokeless tobacco: Never Used  Substance and  Sexual Activity  . Alcohol use: No    Alcohol/week: 0.0 standard drinks  . Drug use: No  . Sexual activity: Yes  Lifestyle  . Physical activity:    Days per week: Not on file    Minutes per session: Not on file  . Stress: Not on file  Relationships  . Social connections:    Talks on phone: Not on file    Gets together: Not on file    Attends religious service: Not on file    Active member of club or organization: Not on file    Attends meetings of clubs or organizations: Not on file    Relationship status: Not on  file  . Intimate partner violence:    Fear of current or ex partner: Not on file    Emotionally abused: Not on file    Physically abused: Not on file    Forced sexual activity: Not on file  Other Topics Concern  . Not on file  Social History Narrative  . Not on file    Review of Systems  Constitutional: Negative for fatigue and unexpected weight change.  Eyes: Negative for visual disturbance.  Respiratory: Negative for cough, chest tightness and shortness of breath.   Cardiovascular: Negative for chest pain, palpitations and leg swelling.  Gastrointestinal: Negative for abdominal pain and blood in stool.  Neurological: Negative for dizziness, light-headedness and headaches.       Objective:   Physical Exam Vitals signs reviewed.  Constitutional:      Appearance: He is well-developed.  HENT:     Head: Normocephalic and atraumatic.  Eyes:     Pupils: Pupils are equal, round, and reactive to light.  Neck:     Vascular: No carotid bruit or JVD.  Cardiovascular:     Rate and Rhythm: Normal rate. Rhythm irregular.     Heart sounds: No murmur.     Comments: Episodic extra beat/somethat irregular but normal rate.  Pulmonary:     Effort: Pulmonary effort is normal.     Breath sounds: Normal breath sounds. No rales.  Skin:    General: Skin is warm and dry.  Neurological:     Mental Status: He is alert and oriented to person, place, and time.    Vitals:    08/12/18 1033  BP: 118/64  Pulse: 60  Temp: 98.4 F (36.9 C)  TempSrc: Oral  SpO2: 98%  Weight: 160 lb 4.8 oz (72.7 kg)  Height: _0  (1.676 m)   Results for orders placed or performed in visit on 08/12/18  POCT urinalysis dipstick  Result Value Ref Range   Color, UA yellow yellow   Clarity, UA clear clear   Glucose, UA =100 (A) negative mg/dL   Bilirubin, UA negative negative   Ketones, POC UA negative negative mg/dL   Spec Grav, UA 1.020 1.010 - 1.025   Blood, UA trace-intact (A) negative   pH, UA 5.5 5.0 - 8.0   Protein Ur, POC negative negative mg/dL   Urobilinogen, UA 1.0 0.2 or 1.0 E.U./dL   Nitrite, UA Negative Negative   Leukocytes, UA Negative Negative  POCT glucose (manual entry)  Result Value Ref Range   POC Glucose 151 (A) 70 - 99 mg/dl   EKG: Frequent PACs, overall rate 71.  Appears to have PACs every other beat.  No apparent ischemic changes     Assessment & Plan:   William Oneill is a 73 y.o. male Type 2 diabetes mellitus with hyperglycemia, without long-term current use of insulin ([place]) - Plan: POCT urinalysis dipstick, POCT glucose (manual entry), Insulin Glargine (LANTUS SOLOSTAR) 100 UNIT/ML Solostar Pen, Lancets (ACCU-CHEK SOFT TOUCH) lancets, Comprehensive metabolic panel Type 2 diabetes mellitus without complication, unspecified whether long term insulin use ([place]) - Plan: glucose blood (ONE TOUCH ULTRA TEST) test strip Uncontrolled type 2 diabetes mellitus without complication, without long-term current use of insulin ([place]) - Plan: metFORMIN (GLUCOPHAGE) 1000 MG tablet, sitaGLIPtin (JANUVIA) 100 MG tablet  -Improving.  Will slightly increase Lantus but hypoglycemic precautions discussed.  No other med changes for now.  Irregular heartbeat - Plan: EKG 12-Lead, Comprehensive metabolic panel, TSH, Magnesium Premature atrial complexes - Plan: Comprehensive metabolic panel,  TSH, Magnesium  -Asymptomatic.  Frequent PACs seen.  Appears well-hydrated at  present.  Will check CMP, TSH, magnesium levels, with ER precautions if any symptoms.   Meds ordered this encounter  Medications  . Insulin Glargine (LANTUS SOLOSTAR) 100 UNIT/ML Solostar Pen    Sig: Inject 17 Units into the skin at bedtime.    Dispense:  3 pen    Refill:  3  . Insulin Pen Needle (PEN NEEDLES) 32G X 4 MM MISC    Sig: 1 application by Does not apply route daily.    Dispense:  90 each    Refill:  3  . glucose blood (ONE TOUCH ULTRA TEST) test strip    Sig: USE AS DIRECTED TO TEST UP TO 4 TIMES A DAY    Dispense:  100 each    Refill:  6  . Lancets (ACCU-CHEK SOFT TOUCH) lancets    Sig: Use as instructed    Dispense:  100 each    Refill:  12  . metFORMIN (GLUCOPHAGE) 1000 MG tablet    Sig: Take 1 tablet (1,000 mg total) by mouth 2 (two) times daily with a meal.    Dispense:  180 tablet    Refill:  1  . sitaGLIPtin (JANUVIA) 100 MG tablet    Sig: Take 1 tablet (100 mg total) by mouth daily.    Dispense:  90 tablet    Refill:  1   Patient Instructions     Increase Lantus to 17 units/day.  Watch for any low blood sugar symptoms with this slight increase.  Overall the numbers do look much better.  No change in Januvia or metformin doses for now.  Recheck in 1 month for repeat A1c testing.   There were some extra heartbeats but not concerning on that EKG. I will check some other blood tests from today to make sure there is no secondary cause for that.  If you have any new heart palpitations, dizziness,  lightheadedness or other new symptoms,  be seen right away.   Return to the clinic or go to the nearest emergency room if any of your symptoms worsen or new symptoms occur.   If you have lab work done today you will be contacted with your lab results within the next 2 weeks.  If you have not heard from [place] then please contact us. The fastest way to get your results is to register for My Chart.   IF you received an x-ray today, you will receive an invoice from  [facility]. Please contact [facility] at [phone] with questions or concerns regarding your invoice.   IF you received labwork today, you will receive an invoice from [place]. Please contact [facility] at [phone] with questions or concerns regarding your invoice.   Our billing staff will not be able to assist you with questions regarding bills from these companies.  You will be contacted with the lab results as soon as they are available. The fastest way to get your results is to activate your My Chart account. Instructions are located on the last page of this paperwork. If you have not heard from [place] regarding the results in 2 weeks, please contact this office.        Signed,   [name], MD [facility].  08/15/18 12:15 PM

## 2018-08-13 LAB — COMPREHENSIVE METABOLIC PANEL
ALT: 32 IU/L (ref 0–44)
AST: 41 IU/L — ABNORMAL HIGH (ref 0–40)
Albumin/Globulin Ratio: 1.7 (ref 1.2–2.2)
Albumin: 4 g/dL (ref 3.5–4.8)
Alkaline Phosphatase: 334 IU/L — ABNORMAL HIGH (ref 39–117)
BILIRUBIN TOTAL: 1.2 mg/dL (ref 0.0–1.2)
BUN / CREAT RATIO: 19 (ref 10–24)
BUN: 12 mg/dL (ref 8–27)
CO2: 23 mmol/L (ref 20–29)
Calcium: 9.3 mg/dL (ref 8.6–10.2)
Chloride: 106 mmol/L (ref 96–106)
Creatinine, Ser: 0.63 mg/dL — ABNORMAL LOW (ref 0.76–1.27)
GFR calc non Af Amer: 99 mL/min/{1.73_m2} (ref >59)
GFR, EST AFRICAN AMERICAN: 115 mL/min/{1.73_m2} (ref >59)
Globulin, Total: 2.4 g/dL (ref 1.5–4.5)
Glucose: 140 mg/dL — ABNORMAL HIGH (ref 65–99)
Potassium: 4.3 mmol/L (ref 3.5–5.2)
Sodium: 145 mmol/L — ABNORMAL HIGH (ref 134–144)
TOTAL PROTEIN: 6.4 g/dL (ref 6.0–8.5)

## 2018-08-13 LAB — MAGNESIUM: Magnesium: 2.1 mg/dL (ref 1.6–2.3)

## 2018-08-13 LAB — TSH: TSH: 1.26 u[IU]/mL (ref 0.450–4.500)

## 2018-08-13 NOTE — Progress Notes (Signed)
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 Benson Norway 12/17/17  IMPRESSION -An infiltrative sessile and ulcerated non obstructing large mass in the cecum. This was noted to be in the cecal cap and was partially circumferential and 2cm in length. It was not noted to be bleeding.  -There was a 2 sessile polyps that were very close that were 4 to 88m that were not biopsies.  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  -The cecal mass was a poorly differentiated invasive adenocarcinoma.  [facility] [place]  Accession number is [id number]    12/17/2017 Imaging    CT CAP W Contrast 12/17/17  IMPRESSION: 1. Focal area of abnormal enhancement involving the ascending colon may represent mass discovered on recent colonoscopy. 2. No specific findings identified to suggest metastatic disease. 3. 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 Nonspecific. 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 4. Nonspecific 3 mm right upper lobe lung nodule is noted. No follow-up needed if patient is low-risk. Non-contrast chest CT can be considered in 12 months if patient is high-risk. This recommendation follows the consensus statement: Guidelines for Management of Incidental Pulmonary Nodules Detected on CT Images: From the Fleischner Society 2017; Radiology 2017; 284:228-243. 5. 7 mm low-density structure in segment 8 of the liver is too small to characterize. 6.  Aortic Atherosclerosis (ICD10-I70.0). 7. Gallstones.    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 BBarry Dieneson 02/02/18      02/02/2018 Pathology Results    Diagnosis 02/02/18  Colon, segmental resection for tumor, right - INVASIVE COLORECTAL ADENOCARCINOMA, TWO TUMORS, 5.5 AND 1.8 CM. - LARGER, 5.5 CM TUMOR INVOLVES VISCERAL PERITONEUM - SMALLER, 1.8 CM TUMOR INVOLVES SUBMUCOSA. - ONE EXTRAMURAL SATELLITE TUMOR NODULE. - METASTATIC CARCINOMA IN ONE OF TWENTY-NINE LYMPH NODES (1/29). - TWO TUBULAR ADENOMAS, 0.6 AND 0.8 CM. - BENIGN APPENDIX WITH FIBROSIS OF THE LUMEN.    02/02/2018 Cancer Staging    Staging form: Colon and Rectum, AJCC 8th Edition - Pathologic stage from 02/02/2018: Stage IIIB (pT4a, pN1a, cM0) - Signed by [name] MD on 02/20/2018    03/08/2018 -  Chemotherapy    Adjuvnat chemo FOLFOX every 2 weeks for 3-6 months starting 03/08/18. Udynaca added at cycle 3 due to neutropenia. Plan to complete 12 cycles on 08/30/2018      CURRENT THERAPY:  FOLFOX 2q weeks for 3-6 months. Started 03/08/2018  INTERVAL HISTORY:  William Oneill is here for a follow up and treatment. He presents to the clinic today by himself. He notes he is doing well overall and tolerating treatment. He is happy about having 2 cycles left of treatment. He notes he lives back and forth with his 2  sisters. He notes occasional tingling in his fingers with no change in gripping or dexterity.     REVIEW OF SYSTEMS:   Constitutional: Denies fevers, chills or abnormal weight loss Eyes: Denies blurriness of vision Ears, nose, mouth, throat, and face: Denies mucositis or sore throat Respiratory: Denies cough, dyspnea or wheezes Cardiovascular: Denies palpitation, chest discomfort or lower extremity swelling Gastrointestinal:  Denies nausea, heartburn or change in bowel habits Skin: Denies abnormal skin rashes Lymphatics: Denies new lymphadenopathy or easy bruising Neurological:Denies numbness or new weaknesses (+) Mild occasional tingling in fingers Behavioral/Psych: Mood is stable, no new changes  All other systems were reviewed with the patient and are negative.  MEDICAL HISTORY:  Past Medical History:  Diagnosis Date  . Anemia   . Cancer ([place])   . Colon cancer ([place])   . Diabetes mellitus without complication ([place])    type 2  . High cholesterol   . History of kidney stones   . Insomnia   . Pneumonia    x1    SURGICAL HISTORY: Past Surgical History:  Procedure Laterality Date  . COLON SURGERY     laparoscopic ascending colectomy  Dr. Barry Dienes 02-02-18  . COLONOSCOPY    . LAPAROSCOPIC PARTIAL COLECTOMY N/A 02/02/2018   Procedure: LAPAROSCOPIC ASCENDING COLECTOMY;  Surgeon: Stark Klein, MD;  Location: [facility];  Service: General;  Laterality: N/A;  . NO PAST SURGERIES    . PORTACATH PLACEMENT Left 03/03/2018   Procedure: INSERTION PORT-A-CATH;  Surgeon: Stark Klein, MD;  Location: [place];  Service: General;  Laterality: Left;  . TONSILLECTOMY     as a child    I have reviewed the social history and family history with the patient and they are unchanged from previous note.  ALLERGIES:  has No Known Allergies.  MEDICATIONS:  Current Outpatient Medications  Medication Sig Dispense Refill  . acetaminophen (TYLENOL) 325 MG tablet Take 2 tablets (650 mg  total) by mouth every 6 (six) hours as needed.    Marland Kitchen atorvastatin (LIPITOR) 10 MG tablet Take 1 tablet (10 mg total) by mouth daily. 90 tablet 1  . blood glucose meter kit and supplies Dispense based on patient and insurance preference. Use up to four times daily as directed. (FOR ICD-9 250.00, 250.01). 1 each 0  . ferrous sulfate 325 (65 FE) MG EC tablet Take 1 tablet (325 mg total) by mouth 3 (three) times daily with meals. 30 tablet 2  . glucose blood (ONE TOUCH ULTRA TEST) test strip USE AS DIRECTED TO TEST UP TO 4 TIMES A DAY 100 each 6  . hydrOXYzine (ATARAX/VISTARIL) 25 MG tablet Take 0.5-1 tablets (12.5-25 mg total) by mouth at bedtime as needed (sleep). 30 tablet 0  . ibuprofen (ADVIL,MOTRIN) 600 MG tablet Take 1 tablet (600 mg total) by mouth every 6 (six) hours as needed (pain not controlled with tylenol). 30 tablet 0  . Insulin Glargine (LANTUS SOLOSTAR) 100 UNIT/ML Solostar Pen Inject 17 Units into the skin at bedtime. 3 pen 3  . Insulin Pen Needle (PEN NEEDLES) 32G X 4 MM MISC 1 application by Does not apply route daily. 90 each 3  . Lancets (ACCU-CHEK SOFT TOUCH) lancets  Use as instructed 100 each 12  . lidocaine-prilocaine (EMLA) cream Apply to affected area once 30 g 3  . metFORMIN (GLUCOPHAGE) 1000 MG tablet Take 1 tablet (1,000 mg total) by mouth 2 (two) times daily with a meal. 180 tablet 1  . Multiple Vitamin (MULTIVITAMIN WITH MINERALS) TABS tablet Take 1 tablet by mouth daily.    . ondansetron (ZOFRAN ODT) 4 MG disintegrating tablet Take 1 tablet (4 mg total) by mouth every 8 (eight) hours as needed for nausea or vomiting. 6 tablet 0  . ondansetron (ZOFRAN) 8 MG tablet Take 1 tablet (8 mg total) by mouth 2 (two) times daily as needed for refractory nausea / vomiting. Start on day 3 after chemotherapy. 30 tablet 1  . prochlorperazine (COMPAZINE) 10 MG tablet Take 1 tablet (10 mg total) by mouth every 6 (six) hours as needed (Nausea or vomiting). 30 tablet 1  . sitaGLIPtin  (JANUVIA) 100 MG tablet Take 1 tablet (100 mg total) by mouth daily. 90 tablet 1   No current facility-administered medications for this visit.    Facility-Administered Medications Ordered in Other Visits  Medication Dose Route Frequency Provider Last Rate Last Dose  . dextrose 5 % solution   Intravenous Continuous [name], MD 20 mL/hr at 08/16/18 1118    . fluorouracil (ADRUCIL) 4,600 mg in sodium chloride 0.9 % 58 mL chemo infusion  2,400 mg/m2 (Treatment Plan Recorded) Intravenous 1 day or 1 dose [name], MD      . leucovorin 768 mg in dextrose 5 % 250 mL infusion  400 mg/m2 (Treatment Plan Recorded) Intravenous Once [name], MD 144 mL/hr at 08/16/18 1122 768 mg at 08/16/18 1122  . oxaliplatin (ELOXATIN) 115 mg in dextrose 5 % 500 mL chemo infusion  60 mg/m2 (Treatment Plan Recorded) Intravenous Once [name], MD 262 mL/hr at 08/16/18 1119 115 mg at 08/16/18 1119    PHYSICAL EXAMINATION: ECOG PERFORMANCE STATUS: 1 - Symptomatic but completely ambulatory  Vitals:   08/16/18 0930  BP: 118/69  Pulse: 72  Resp: 17  Temp: 98.1 F (36.7 C)  SpO2: 98%   Filed Weights   08/16/18 0930  Weight: 163 lb 3.2 oz (74 kg)    GENERAL:alert, no distress and comfortable SKIN: skin color, texture, turgor are normal, no rashes or significant lesions EYES: normal, Conjunctiva are pink and non-injected, sclera clear OROPHARYNX:no exudate, no erythema and lips, buccal mucosa, and tongue normal  NECK: supple, thyroid normal size, non-tender, without nodularity LYMPH:  no palpable lymphadenopathy in the cervical, axillary or inguinal LUNGS: clear to auscultation and percussion with normal breathing effort HEART: regular rate & rhythm and no murmurs and no lower extremity edema ABDOMEN:abdomen soft, non-tender and normal bowel sounds Musculoskeletal:no cyanosis of digits and no clubbing  NEURO: alert & oriented x 3 with fluent speech, no focal motor/sensory deficits  LABORATORY DATA:  I have  reviewed the data as listed CBC Latest Ref Rng & Units 08/16/2018 08/02/2018 07/19/2018  WBC 4.0 - 10.5 K/uL 6.4 7.8 8.3  Hemoglobin 13.0 - 17.0 g/dL 12.6(L) 13.2 13.8  Hematocrit 39.0 - 52.0 % 37.3(L) 39.0 40.4  Platelets 150 - 400 K/uL 93(L) 117(L) 124(L)     CMP Latest Ref Rng & Units 08/16/2018 08/12/2018 08/02/2018  Glucose 70 - 99 mg/dL 173(H) 140(H) 191(H)  BUN 8 - 23 mg/dL '14 12 12  '$ Creatinine 0.61 - 1.24 mg/dL 0.75 0.63(L) 0.74  Sodium 135 - 145 mmol/L 141 145(H) 139  Potassium 3.5 - 5.1 mmol/L  3.5 4.3 4.0  Chloride 98 - 111 mmol/L 109 106 107  CO2 22 - 32 mmol/L '24 23 24  '$ Calcium 8.9 - 10.3 mg/dL 8.3(L) 9.3 8.9  Total Protein 6.5 - 8.1 g/dL 6.1(L) 6.4 6.2(L)  Total Bilirubin 0.3 - 1.2 mg/dL 1.3(H) 1.2 1.2  Alkaline Phos 38 - 126 U/L 275(H) 334(H) 249(H)  AST 15 - 41 U/L 47(H) 41(H) 39  ALT 0 - 44 U/L 37 32 30      RADIOGRAPHIC STUDIES: I have personally reviewed the radiological images as listed and agreed with the findings in the report. No results found.   ASSESSMENT & PLAN:  William Oneill is a 73 y.o. male with    1. Cecal Cancer,adenocarcinoma, grade 2, pT4aN1aM0,stage IIIB, MSS -Diagnosed 02/2018.Status post hemicolectomy with negative surgical margins -He is currently on adjuvantchemotherapy FOLFOX q2 weeks,plan for 6 months, due to high risk of recurrence.  -Labs reviewed, CBC WNL except hg at 12.6 and PLT at 93K. CEA and CMP are still pending. Labs overall adequate to proceed with FOLFOX today with reduced oaxliplatin dose for cycel 11 and 12 due to thrombocytopenia.  -Plan for CT scan in 09/2018 and Colonoscopy in 02/2019  -F/u in 2 weeks with last cycle chemo   2. IDA -Received IV Feraheme x2 on 03/24/2018 and 04/13/2018.  -Mild anemia with hg at 12.6 today (08/16/2018)  3. DM -Continuemetformin, lantus insulin at bedtime,and [place]. -f/u with PCP  4. 24m Liver lesion -Plan to repeat CT upon completion of chemo to monitor   5. Chemotherapy  related fatigue and weakness -Started after cycle 4. Cycle 5 required dose reduction and delay -He tries to stay active and awake. His fatigue continues to improve.   6. Thrombocytopenia  -secondary to chemo -no bleeding, monitor clinically, will reduce his chemo dose   Plan -Lab reviewed, adequate for treatment today with oxaliplatin dose reduction due to thrombocytopenia. -Lab, flush, f/u and Last cycle FOLFOX in 2 weeks     No problem-specific Assessment & Plan notes found for this encounter.   No orders of the defined types were placed in this encounter.  All questions were answered. The patient knows to call the clinic with any problems, questions or concerns. No barriers to learning was detected. I spent 15 minutes counseling the patient face to face. The total time spent in the appointment was 20 minutes and more than 50% was on counseling and review of test results     YTruitt Merle MD 08/16/2018   I, AJoslyn Devon am acting as scribe for YTruitt Merle MD.   I have reviewed the above documentation for accuracy and completeness, and I agree with the above.

## 2018-08-15 ENCOUNTER — Encounter: Payer: Self-pay | Admitting: Family Medicine

## 2018-08-16 ENCOUNTER — Encounter: Payer: Self-pay | Admitting: Hematology

## 2018-08-16 ENCOUNTER — Inpatient Hospital Stay: Payer: Medicare Other

## 2018-08-16 ENCOUNTER — Inpatient Hospital Stay (HOSPITAL_BASED_OUTPATIENT_CLINIC_OR_DEPARTMENT_OTHER): Payer: Medicare Other | Admitting: Hematology

## 2018-08-16 ENCOUNTER — Inpatient Hospital Stay: Payer: Medicare Other | Admitting: Nutrition

## 2018-08-16 VITALS — BP 118/69 | HR 72 | Temp 98.1°F | Resp 17 | Ht 66.0 in | Wt 163.2 lb

## 2018-08-16 DIAGNOSIS — D509 Iron deficiency anemia, unspecified: Secondary | ICD-10-CM | POA: Diagnosis not present

## 2018-08-16 DIAGNOSIS — K769 Liver disease, unspecified: Secondary | ICD-10-CM | POA: Diagnosis not present

## 2018-08-16 DIAGNOSIS — E119 Type 2 diabetes mellitus without complications: Secondary | ICD-10-CM

## 2018-08-16 DIAGNOSIS — Z5111 Encounter for antineoplastic chemotherapy: Secondary | ICD-10-CM | POA: Diagnosis not present

## 2018-08-16 DIAGNOSIS — C180 Malignant neoplasm of cecum: Secondary | ICD-10-CM

## 2018-08-16 DIAGNOSIS — D6959 Other secondary thrombocytopenia: Secondary | ICD-10-CM

## 2018-08-16 DIAGNOSIS — Z95828 Presence of other vascular implants and grafts: Secondary | ICD-10-CM

## 2018-08-16 LAB — CBC WITH DIFFERENTIAL (CANCER CENTER ONLY)
ABS IMMATURE GRANULOCYTES: 0.14 10*3/uL — AB (ref 0.00–0.07)
BASOS PCT: 2 %
Basophils Absolute: 0.1 10*3/uL (ref 0.0–0.1)
Eosinophils Absolute: 0.2 10*3/uL (ref 0.0–0.5)
Eosinophils Relative: 4 %
HCT: 37.3 % — ABNORMAL LOW (ref 39.0–52.0)
HEMOGLOBIN: 12.6 g/dL — AB (ref 13.0–17.0)
Immature Granulocytes: 2 %
LYMPHS ABS: 1.1 10*3/uL (ref 0.7–4.0)
Lymphocytes Relative: 18 %
MCH: 31.7 pg (ref 26.0–34.0)
MCHC: 33.8 g/dL (ref 30.0–36.0)
MCV: 94 fL (ref 80.0–100.0)
Monocytes Absolute: 0.7 10*3/uL (ref 0.1–1.0)
Monocytes Relative: 11 %
Neutro Abs: 4.1 10*3/uL (ref 1.7–7.7)
Neutrophils Relative %: 63 %
Platelet Count: 93 10*3/uL — ABNORMAL LOW (ref 150–400)
RBC: 3.97 MIL/uL — AB (ref 4.22–5.81)
RDW: 14.3 % (ref 11.5–15.5)
WBC Count: 6.4 10*3/uL (ref 4.0–10.5)
nRBC: 0 % (ref 0.0–0.2)

## 2018-08-16 LAB — CMP (CANCER CENTER ONLY)
ALT: 37 U/L (ref 0–44)
AST: 47 U/L — ABNORMAL HIGH (ref 15–41)
Albumin: 3.3 g/dL — ABNORMAL LOW (ref 3.5–5.0)
Alkaline Phosphatase: 275 U/L — ABNORMAL HIGH (ref 38–126)
Anion gap: 8 (ref 5–15)
BUN: 14 mg/dL (ref 8–23)
CO2: 24 mmol/L (ref 22–32)
Calcium: 8.3 mg/dL — ABNORMAL LOW (ref 8.9–10.3)
Chloride: 109 mmol/L (ref 98–111)
Creatinine: 0.75 mg/dL (ref 0.61–1.24)
GFR, Est AFR Am: >60 mL/min (ref >60)
GFR, Estimated: >60 mL/min (ref >60)
Glucose, Bld: 173 mg/dL — ABNORMAL HIGH (ref 70–99)
Potassium: 3.5 mmol/L (ref 3.5–5.1)
SODIUM: 141 mmol/L (ref 135–145)
Total Bilirubin: 1.3 mg/dL — ABNORMAL HIGH (ref 0.3–1.2)
Total Protein: 6.1 g/dL — ABNORMAL LOW (ref 6.5–8.1)

## 2018-08-16 LAB — CEA (IN HOUSE-CHCC): CEA (CHCC-In House): 3.9 ng/mL (ref 0.00–5.00)

## 2018-08-16 MED ORDER — SODIUM CHLORIDE 0.9 % IV SOLN
Freq: Once | INTRAVENOUS | Status: AC
  Administered 2018-08-16: 11:00:00 via INTRAVENOUS
  Filled 2018-08-16: qty 5

## 2018-08-16 MED ORDER — PALONOSETRON HCL INJECTION 0.25 MG/5ML
0.2500 mg | Freq: Once | INTRAVENOUS | Status: AC
  Administered 2018-08-16: 0.25 mg via INTRAVENOUS

## 2018-08-16 MED ORDER — OXALIPLATIN CHEMO INJECTION 100 MG/20ML
60.0000 mg/m2 | Freq: Once | INTRAVENOUS | Status: AC
  Administered 2018-08-16: 115 mg via INTRAVENOUS
  Filled 2018-08-16: qty 20

## 2018-08-16 MED ORDER — DEXTROSE 5 % IV SOLN
Freq: Once | INTRAVENOUS | Status: AC
  Administered 2018-08-16: 10:00:00 via INTRAVENOUS
  Filled 2018-08-16: qty 250

## 2018-08-16 MED ORDER — LEUCOVORIN CALCIUM INJECTION 350 MG
400.0000 mg/m2 | Freq: Once | INTRAVENOUS | Status: AC
  Administered 2018-08-16: 768 mg via INTRAVENOUS
  Filled 2018-08-16: qty 38.4

## 2018-08-16 MED ORDER — SODIUM CHLORIDE 0.9% FLUSH
10.0000 mL | INTRAVENOUS | Status: DC | PRN
  Administered 2018-08-16: 10 mL
  Filled 2018-08-16: qty 10

## 2018-08-16 MED ORDER — DEXTROSE 5 % IV SOLN
INTRAVENOUS | Status: DC
  Administered 2018-08-16: 11:00:00 via INTRAVENOUS
  Filled 2018-08-16: qty 250

## 2018-08-16 MED ORDER — PALONOSETRON HCL INJECTION 0.25 MG/5ML
INTRAVENOUS | Status: AC
  Filled 2018-08-16: qty 5

## 2018-08-16 MED ORDER — SODIUM CHLORIDE 0.9 % IV SOLN
2400.0000 mg/m2 | INTRAVENOUS | Status: DC
  Administered 2018-08-16: 4600 mg via INTRAVENOUS
  Filled 2018-08-16: qty 92

## 2018-08-16 NOTE — Progress Notes (Signed)
Brief nutrition follow-up completed with patient during infusion for cecal cancer. Weight improved and documented as 163 pounds on January 13, increased from 160 pounds January 9.  Weight is still down from 168 pounds December 16. Patient denies nutrition impact symptoms. Reports he would like to start drinking Glucerna again.  Nutrition diagnosis: Unintended weight loss has continued.  Intervention: Recommended patient strive for weight maintenance. Reviewed small frequent meals with high-calorie high-protein foods. Patient has requested I mailed Glucerna coupons to him which I will do. Contact information provided.  Monitoring, evaluation, goals: Patient will continue to work to increase calories and protein to stabilize weight.  Next visit: To be scheduled as needed.  **Disclaimer: This note was dictated with voice recognition software. Similar sounding words can inadvertently be transcribed and this note may contain transcription errors which may not have been corrected upon publication of note.**

## 2018-08-16 NOTE — Progress Notes (Signed)
Okay to treat per Dr. Burr Medico with Platelets 93 (dose will be decreased). No iron today.

## 2018-08-16 NOTE — Patient Instructions (Signed)
[facility] Discharge Instructions for Patients Receiving Chemotherapy  Today you received the following chemotherapy agents Oxaliplatin, Leucovorin, 5FU  To help prevent nausea and vomiting after your treatment, we encourage you to take your nausea medication as directed   If you develop nausea and vomiting that is not controlled by your nausea medication, call the clinic.   BELOW ARE SYMPTOMS THAT SHOULD BE REPORTED IMMEDIATELY:  *FEVER GREATER THAN 100.5 F  *CHILLS WITH OR WITHOUT FEVER  NAUSEA AND VOMITING THAT IS NOT CONTROLLED WITH YOUR NAUSEA MEDICATION  *UNUSUAL SHORTNESS OF BREATH  *UNUSUAL BRUISING OR BLEEDING  TENDERNESS IN MOUTH AND THROAT WITH OR WITHOUT PRESENCE OF ULCERS  *URINARY PROBLEMS  *BOWEL PROBLEMS  UNUSUAL RASH Items with * indicate a potential emergency and should be followed up as soon as possible.  Feel free to call the clinic should you have any questions or concerns. The clinic phone number is [phone].  Please show the CHEMO ALERT CARD at check-in to the Emergency Department and triage nurse.   

## 2018-08-17 ENCOUNTER — Telehealth: Payer: Self-pay | Admitting: Hematology

## 2018-08-17 NOTE — Telephone Encounter (Signed)
No los per 01/13. °

## 2018-08-18 ENCOUNTER — Inpatient Hospital Stay: Payer: Medicare Other

## 2018-08-18 VITALS — BP 134/60 | HR 61 | Temp 98.4°F | Resp 18

## 2018-08-18 DIAGNOSIS — Z5111 Encounter for antineoplastic chemotherapy: Secondary | ICD-10-CM | POA: Diagnosis not present

## 2018-08-18 DIAGNOSIS — C180 Malignant neoplasm of cecum: Secondary | ICD-10-CM

## 2018-08-18 MED ORDER — PEGFILGRASTIM-CBQV 6 MG/0.6ML ~~LOC~~ SOSY
PREFILLED_SYRINGE | SUBCUTANEOUS | Status: AC
  Filled 2018-08-18: qty 0.6

## 2018-08-18 MED ORDER — PEGFILGRASTIM-CBQV 6 MG/0.6ML ~~LOC~~ SOSY
6.0000 mg | PREFILLED_SYRINGE | Freq: Once | SUBCUTANEOUS | Status: AC
  Administered 2018-08-18: 6 mg via SUBCUTANEOUS

## 2018-08-18 MED ORDER — SODIUM CHLORIDE 0.9% FLUSH
10.0000 mL | INTRAVENOUS | Status: DC | PRN
  Administered 2018-08-18: 10 mL
  Filled 2018-08-18: qty 10

## 2018-08-18 MED ORDER — HEPARIN SOD (PORK) LOCK FLUSH 100 UNIT/ML IV SOLN
500.0000 [IU] | Freq: Once | INTRAVENOUS | Status: AC | PRN
  Administered 2018-08-18: 500 [IU]
  Filled 2018-08-18: qty 5

## 2018-08-24 ENCOUNTER — Emergency Department (HOSPITAL_COMMUNITY): Payer: Medicare Other

## 2018-08-24 ENCOUNTER — Observation Stay (HOSPITAL_COMMUNITY)
Admission: EM | Admit: 2018-08-24 | Discharge: 2018-08-31 | Disposition: A | Discharge status: Skilled Nursing Facility | Payer: Medicare Other | Attending: Internal Medicine | Admitting: Internal Medicine

## 2018-08-24 ENCOUNTER — Encounter (HOSPITAL_COMMUNITY): Payer: Self-pay | Admitting: Emergency Medicine

## 2018-08-24 DIAGNOSIS — C189 Malignant neoplasm of colon, unspecified: Secondary | ICD-10-CM | POA: Diagnosis present

## 2018-08-24 DIAGNOSIS — W19XXXA Unspecified fall, initial encounter: Secondary | ICD-10-CM | POA: Diagnosis present

## 2018-08-24 DIAGNOSIS — M79604 Pain in right leg: Secondary | ICD-10-CM | POA: Insufficient documentation

## 2018-08-24 DIAGNOSIS — S7001XA Contusion of right hip, initial encounter: Secondary | ICD-10-CM | POA: Insufficient documentation

## 2018-08-24 DIAGNOSIS — R2681 Unsteadiness on feet: Secondary | ICD-10-CM | POA: Diagnosis not present

## 2018-08-24 DIAGNOSIS — Z79899 Other long term (current) drug therapy: Secondary | ICD-10-CM | POA: Insufficient documentation

## 2018-08-24 DIAGNOSIS — D72829 Elevated white blood cell count, unspecified: Secondary | ICD-10-CM | POA: Diagnosis not present

## 2018-08-24 DIAGNOSIS — R2689 Other abnormalities of gait and mobility: Secondary | ICD-10-CM | POA: Diagnosis not present

## 2018-08-24 DIAGNOSIS — S300XXA Contusion of lower back and pelvis, initial encounter: Secondary | ICD-10-CM | POA: Diagnosis present

## 2018-08-24 DIAGNOSIS — E785 Hyperlipidemia, unspecified: Secondary | ICD-10-CM | POA: Diagnosis not present

## 2018-08-24 DIAGNOSIS — D696 Thrombocytopenia, unspecified: Secondary | ICD-10-CM | POA: Insufficient documentation

## 2018-08-24 DIAGNOSIS — S82832A Other fracture of upper and lower end of left fibula, initial encounter for closed fracture: Secondary | ICD-10-CM | POA: Diagnosis not present

## 2018-08-24 DIAGNOSIS — R52 Pain, unspecified: Secondary | ICD-10-CM | POA: Diagnosis present

## 2018-08-24 DIAGNOSIS — I10 Essential (primary) hypertension: Secondary | ICD-10-CM | POA: Insufficient documentation

## 2018-08-24 DIAGNOSIS — S42101A Fracture of unspecified part of scapula, right shoulder, initial encounter for closed fracture: Secondary | ICD-10-CM | POA: Diagnosis not present

## 2018-08-24 DIAGNOSIS — M25551 Pain in right hip: Secondary | ICD-10-CM | POA: Diagnosis not present

## 2018-08-24 DIAGNOSIS — R17 Unspecified jaundice: Secondary | ICD-10-CM

## 2018-08-24 DIAGNOSIS — M25562 Pain in left knee: Secondary | ICD-10-CM | POA: Insufficient documentation

## 2018-08-24 DIAGNOSIS — E119 Type 2 diabetes mellitus without complications: Secondary | ICD-10-CM | POA: Diagnosis not present

## 2018-08-24 DIAGNOSIS — Z9049 Acquired absence of other specified parts of digestive tract: Secondary | ICD-10-CM | POA: Insufficient documentation

## 2018-08-24 DIAGNOSIS — Z7984 Long term (current) use of oral hypoglycemic drugs: Secondary | ICD-10-CM | POA: Insufficient documentation

## 2018-08-24 DIAGNOSIS — R9431 Abnormal electrocardiogram [ECG] [EKG]: Secondary | ICD-10-CM | POA: Insufficient documentation

## 2018-08-24 DIAGNOSIS — M2550 Pain in unspecified joint: Secondary | ICD-10-CM | POA: Diagnosis present

## 2018-08-24 DIAGNOSIS — I4891 Unspecified atrial fibrillation: Secondary | ICD-10-CM | POA: Insufficient documentation

## 2018-08-24 DIAGNOSIS — T07XXXA Unspecified multiple injuries, initial encounter: Secondary | ICD-10-CM | POA: Diagnosis present

## 2018-08-24 DIAGNOSIS — D649 Anemia, unspecified: Secondary | ICD-10-CM | POA: Diagnosis not present

## 2018-08-24 DIAGNOSIS — Z23 Encounter for immunization: Secondary | ICD-10-CM | POA: Diagnosis not present

## 2018-08-24 DIAGNOSIS — Z8249 Family history of ischemic heart disease and other diseases of the circulatory system: Secondary | ICD-10-CM | POA: Insufficient documentation

## 2018-08-24 HISTORY — DX: Unspecified atrial fibrillation: I48.91

## 2018-08-24 LAB — CBC
HCT: 39.8 % (ref 39.0–52.0)
Hemoglobin: 12.9 g/dL — ABNORMAL LOW (ref 13.0–17.0)
MCH: 31.2 pg (ref 26.0–34.0)
MCHC: 32.4 g/dL (ref 30.0–36.0)
MCV: 96.4 fL (ref 80.0–100.0)
PLATELETS: 134 10*3/uL — AB (ref 150–400)
RBC: 4.13 MIL/uL — ABNORMAL LOW (ref 4.22–5.81)
RDW: 14.3 % (ref 11.5–15.5)
WBC: 16.6 10*3/uL — ABNORMAL HIGH (ref 4.0–10.5)
nRBC: 0 % (ref 0.0–0.2)

## 2018-08-24 LAB — COMPREHENSIVE METABOLIC PANEL
ALBUMIN: 3.5 g/dL (ref 3.5–5.0)
ALT: 37 U/L (ref 0–44)
AST: 49 U/L — ABNORMAL HIGH (ref 15–41)
Alkaline Phosphatase: 292 U/L — ABNORMAL HIGH (ref 38–126)
Anion gap: 9 (ref 5–15)
BUN: 13 mg/dL (ref 8–23)
CO2: 24 mmol/L (ref 22–32)
Calcium: 8.7 mg/dL — ABNORMAL LOW (ref 8.9–10.3)
Chloride: 106 mmol/L (ref 98–111)
Creatinine, Ser: 0.73 mg/dL (ref 0.61–1.24)
GFR calc Af Amer: >60 mL/min (ref >60)
GFR calc non Af Amer: >60 mL/min (ref >60)
Glucose, Bld: 186 mg/dL — ABNORMAL HIGH (ref 70–99)
Potassium: 3.8 mmol/L (ref 3.5–5.1)
Sodium: 139 mmol/L (ref 135–145)
Total Bilirubin: 1.4 mg/dL — ABNORMAL HIGH (ref 0.3–1.2)
Total Protein: 6 g/dL — ABNORMAL LOW (ref 6.5–8.1)

## 2018-08-24 LAB — PROTIME-INR
INR: 1.05
Prothrombin Time: 13.6 seconds (ref 11.4–15.2)

## 2018-08-24 LAB — LACTIC ACID, PLASMA: Lactic Acid, Venous: 3.1 mmol/L (ref 0.5–1.9)

## 2018-08-24 LAB — SAMPLE TO BLOOD BANK

## 2018-08-24 LAB — ETHANOL

## 2018-08-24 MED ORDER — TETANUS-DIPHTH-ACELL PERTUSSIS 5-2.5-18.5 LF-MCG/0.5 IM SUSP
0.5000 mL | Freq: Once | INTRAMUSCULAR | Status: AC
  Administered 2018-08-24: 0.5 mL via INTRAMUSCULAR
  Filled 2018-08-24: qty 0.5

## 2018-08-24 MED ORDER — IOHEXOL 300 MG/ML  SOLN
100.0000 mL | Freq: Once | INTRAMUSCULAR | Status: AC | PRN
  Administered 2018-08-24: 100 mL via INTRAVENOUS

## 2018-08-24 MED ORDER — FENTANYL CITRATE (PF) 100 MCG/2ML IJ SOLN
50.0000 ug | Freq: Once | INTRAMUSCULAR | Status: AC
  Administered 2018-08-24: 50 ug via INTRAVENOUS
  Filled 2018-08-24: qty 2

## 2018-08-24 MED ORDER — ONDANSETRON HCL 4 MG/2ML IJ SOLN
4.0000 mg | Freq: Once | INTRAMUSCULAR | Status: AC
  Administered 2018-08-24: 4 mg via INTRAVENOUS
  Filled 2018-08-24: qty 2

## 2018-08-24 MED ORDER — SODIUM CHLORIDE 0.9 % IV SOLN
Freq: Once | INTRAVENOUS | Status: AC
  Administered 2018-08-25: via INTRAVENOUS

## 2018-08-24 MED ORDER — SUCRALFATE 1 GM/10ML PO SUSP: 1.0000 g | Freq: Three times a day (TID) | ORAL | 0 refills | Status: DC
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
## 2018-08-24 NOTE — ED Notes (Signed)
Pt assisted to side of bed, reports feeling nauseous. 1 episode of vomiting. Dr. Melina Copa aware, 4mg  IV zofran ordered.
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
## 2018-08-25 ENCOUNTER — Other Ambulatory Visit: Payer: Self-pay

## 2018-08-25 ENCOUNTER — Encounter (HOSPITAL_COMMUNITY): Payer: Self-pay | Admitting: Internal Medicine

## 2018-08-25 DIAGNOSIS — S300XXA Contusion of lower back and pelvis, initial encounter: Secondary | ICD-10-CM | POA: Insufficient documentation

## 2018-08-25 DIAGNOSIS — M25551 Pain in right hip: Secondary | ICD-10-CM

## 2018-08-25 DIAGNOSIS — T07XXXA Unspecified multiple injuries, initial encounter: Secondary | ICD-10-CM | POA: Diagnosis not present

## 2018-08-25 DIAGNOSIS — M2550 Pain in unspecified joint: Secondary | ICD-10-CM | POA: Diagnosis present

## 2018-08-25 DIAGNOSIS — R52 Pain, unspecified: Secondary | ICD-10-CM | POA: Diagnosis present

## 2018-08-25 DIAGNOSIS — C189 Malignant neoplasm of colon, unspecified: Secondary | ICD-10-CM | POA: Diagnosis present

## 2018-08-25 DIAGNOSIS — E119 Type 2 diabetes mellitus without complications: Secondary | ICD-10-CM | POA: Diagnosis not present

## 2018-08-25 DIAGNOSIS — W19XXXA Unspecified fall, initial encounter: Secondary | ICD-10-CM | POA: Diagnosis present

## 2018-08-25 DIAGNOSIS — I10 Essential (primary) hypertension: Secondary | ICD-10-CM | POA: Diagnosis present

## 2018-08-25 LAB — CBG MONITORING, ED: Glucose-Capillary: 168 mg/dL — ABNORMAL HIGH (ref 70–99)

## 2018-08-25 LAB — COMPREHENSIVE METABOLIC PANEL
ALT: 38 U/L (ref 0–44)
AST: 51 U/L — ABNORMAL HIGH (ref 15–41)
Albumin: 3.2 g/dL — ABNORMAL LOW (ref 3.5–5.0)
Alkaline Phosphatase: 239 U/L — ABNORMAL HIGH (ref 38–126)
Anion gap: 6 (ref 5–15)
BUN: 13 mg/dL (ref 8–23)
CO2: 23 mmol/L (ref 22–32)
Calcium: 8.3 mg/dL — ABNORMAL LOW (ref 8.9–10.3)
Chloride: 111 mmol/L (ref 98–111)
Creatinine, Ser: 0.85 mg/dL (ref 0.61–1.24)
GFR calc Af Amer: >60 mL/min (ref >60)
GFR calc non Af Amer: >60 mL/min (ref >60)
Glucose, Bld: 207 mg/dL — ABNORMAL HIGH (ref 70–99)
Potassium: 4.1 mmol/L (ref 3.5–5.1)
Sodium: 140 mmol/L (ref 135–145)
TOTAL PROTEIN: 5.7 g/dL — AB (ref 6.5–8.1)
Total Bilirubin: 1.6 mg/dL — ABNORMAL HIGH (ref 0.3–1.2)

## 2018-08-25 LAB — CBC WITH DIFFERENTIAL/PLATELET
Abs Immature Granulocytes: 0.11 10*3/uL — ABNORMAL HIGH (ref 0.00–0.07)
Basophils Absolute: 0.1 10*3/uL (ref 0.0–0.1)
Basophils Relative: 1 %
Eosinophils Absolute: 0 10*3/uL (ref 0.0–0.5)
Eosinophils Relative: 0 %
HCT: 33.2 % — ABNORMAL LOW (ref 39.0–52.0)
Hemoglobin: 10.8 g/dL — ABNORMAL LOW (ref 13.0–17.0)
IMMATURE GRANULOCYTES: 1 %
Lymphocytes Relative: 7 %
Lymphs Abs: 0.7 10*3/uL (ref 0.7–4.0)
MCH: 31.3 pg (ref 26.0–34.0)
MCHC: 32.5 g/dL (ref 30.0–36.0)
MCV: 96.2 fL (ref 80.0–100.0)
Monocytes Absolute: 1.3 10*3/uL — ABNORMAL HIGH (ref 0.1–1.0)
Monocytes Relative: 14 %
NEUTROS ABS: 7.6 10*3/uL (ref 1.7–7.7)
Neutrophils Relative %: 77 %
Platelets: 97 10*3/uL — ABNORMAL LOW (ref 150–400)
RBC: 3.45 MIL/uL — ABNORMAL LOW (ref 4.22–5.81)
RDW: 14.2 % (ref 11.5–15.5)
WBC: 9.7 10*3/uL (ref 4.0–10.5)
nRBC: 0 % (ref 0.0–0.2)

## 2018-08-25 LAB — HEPATIC FUNCTION PANEL
ALT: 36 U/L (ref 0–44)
AST: 50 U/L — ABNORMAL HIGH (ref 15–41)
Albumin: 3 g/dL — ABNORMAL LOW (ref 3.5–5.0)
Alkaline Phosphatase: 212 U/L — ABNORMAL HIGH (ref 38–126)
Bilirubin, Direct: 0.5 mg/dL — ABNORMAL HIGH (ref 0.0–0.2)
Indirect Bilirubin: 1.3 mg/dL — ABNORMAL HIGH (ref 0.3–0.9)
Total Bilirubin: 1.8 mg/dL — ABNORMAL HIGH (ref 0.3–1.2)
Total Protein: 5.7 g/dL — ABNORMAL LOW (ref 6.5–8.1)

## 2018-08-25 LAB — GLUCOSE, CAPILLARY
GLUCOSE-CAPILLARY: 161 mg/dL — AB (ref 70–99)
Glucose-Capillary: 157 mg/dL — ABNORMAL HIGH (ref 70–99)
Glucose-Capillary: 147 mg/dL — ABNORMAL HIGH (ref 70–99)

## 2018-08-25 LAB — CK: Total CK: 203 U/L (ref 49–397)

## 2018-08-25 MED ORDER — ACETAMINOPHEN 325 MG PO TABS
650.0000 mg | ORAL_TABLET | Freq: Four times a day (QID) | ORAL | Status: DC | PRN
  Administered 2018-08-25 – 2018-08-30 (×9): 650 mg via ORAL
  Filled 2018-08-25 (×9): qty 2

## 2018-08-25 MED ORDER — ONDANSETRON HCL 4 MG/2ML IJ SOLN: 4.0000 mg | Freq: Four times a day (QID) | INTRAMUSCULAR | Status: DC | PRN

## 2018-08-25 MED ORDER — ENOXAPARIN SODIUM 40 MG/0.4ML ~~LOC~~ SOLN: 40.0000 mg | SUBCUTANEOUS | Status: DC

## 2018-08-25 MED ORDER — INSULIN ASPART 100 UNIT/ML ~~LOC~~ SOLN
0.0000 [IU] | Freq: Three times a day (TID) | SUBCUTANEOUS | Status: DC
  Administered 2018-08-25 – 2018-08-26 (×5): 2 [IU] via SUBCUTANEOUS
  Administered 2018-08-26: 1 [IU] via SUBCUTANEOUS
  Administered 2018-08-27: 2 [IU] via SUBCUTANEOUS
  Administered 2018-08-27: 1 [IU] via SUBCUTANEOUS
  Administered 2018-08-27 – 2018-08-28 (×2): 2 [IU] via SUBCUTANEOUS
  Administered 2018-08-28: 3 [IU] via SUBCUTANEOUS
  Administered 2018-08-28: 1 [IU] via SUBCUTANEOUS
  Administered 2018-08-29 (×2): 2 [IU] via SUBCUTANEOUS
  Administered 2018-08-29: 1 [IU] via SUBCUTANEOUS
  Administered 2018-08-30 – 2018-08-31 (×4): 2 [IU] via SUBCUTANEOUS

## 2018-08-25 MED ORDER — ACETAMINOPHEN 650 MG RE SUPP: 650.0000 mg | Freq: Four times a day (QID) | RECTAL | Status: DC | PRN

## 2018-08-25 MED ORDER — ATORVASTATIN CALCIUM 10 MG PO TABS
10.0000 mg | ORAL_TABLET | Freq: Every day | ORAL | Status: DC
  Administered 2018-08-25 – 2018-08-30 (×6): 10 mg via ORAL
  Filled 2018-08-25 (×6): qty 1

## 2018-08-25 MED ORDER — ONDANSETRON HCL 4 MG PO TABS: 4.0000 mg | ORAL_TABLET | Freq: Four times a day (QID) | ORAL | Status: DC | PRN

## 2018-08-25 MED ORDER — MORPHINE SULFATE (PF) 2 MG/ML IV SOLN
1.0000 mg | INTRAVENOUS | Status: DC | PRN
  Administered 2018-08-26 – 2018-08-31 (×12): 1 mg via INTRAVENOUS
  Filled 2018-08-25 (×14): qty 1
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
## 2018-08-25 NOTE — ED Notes (Signed)
Trauma PA rounding at bedside
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
## 2018-08-25 NOTE — ED Notes (Signed)
Ok to eat and drink per Dr. Hal Hope

## 2018-08-25 NOTE — ED Notes (Signed)
Ordered a breakfast tray--William Oneill 

## 2018-08-25 NOTE — NC FL2 (Signed)
[place] LEVEL OF CARE SCREENING TOOL     IDENTIFICATION  Patient Name: William Oneill Birthdate: [birth year] Sex: male Admission Date (Current Location): 08/24/2018  [facility] and [place] Number:  Herbalist and Address:  [facility] [place], [place] [address] [place], [place] [address]      Provider Number: [id number]  Attending Physician Name and Address:  Rise Patience, MD  Relative Name and Phone Number:  Hart Carwin and Candelaria Stagers; sisters; [phone] and [phone]    Current Level of Care: Hospital Recommended Level of Care: [place] Prior Approval Number:    Date Approved/Denied:   PASRR Number: [id number]  Discharge Plan: SNF    Current Diagnoses: Patient Active Problem List   Diagnosis Date Noted  . Fall 08/25/2018  . Joint pain 08/25/2018  . Essential hypertension 08/25/2018  . Diabetes mellitus type 2 in nonobese ([place]) 08/25/2018  . Colon cancer ([place]) 08/25/2018  . Pain 08/25/2018  . Abrasions of multiple sites   . Contusion of buttock     Orientation RESPIRATION BLADDER Height & Weight     Self, Time, Situation, Place  Normal Continent Weight: 163 lb (73.9 kg) Height:  5\' 6"  (167.6 cm)  BEHAVIORAL SYMPTOMS/MOOD NEUROLOGICAL BOWEL NUTRITION STATUS      Continent Diet(see discharge summary)  AMBULATORY STATUS COMMUNICATION OF NEEDS Skin   Extensive Assist Verbally Skin abrasions, Bruising, Other (Comment)(right hip and left leg abrasion; hematoma)                       Personal Care Assistance Level of Assistance  Bathing, Feeding, Dressing Bathing Assistance: Maximum assistance Feeding assistance: Independent Dressing Assistance: Maximum assistance     Functional Limitations Info  Sight, Hearing, Speech Sight Info: Adequate Hearing Info: Adequate Speech Info: Adequate(wears glasses)    SPECIAL CARE FACTORS FREQUENCY  PT (By licensed PT), OT (By licensed OT)      PT Frequency: 5x week OT Frequency: 5x week            Contractures Contractures Info: Not present    Additional Factors Info  Code Status, Allergies, Insulin Sliding Scale Code Status Info: Full Code Allergies Info: No Known Allergies      Isolation Precautions Info: insulin aspart (novoLOG) injection 0-9 Units 3x daily with meals     Current Medications (08/25/2018):  This is the current hospital active medication list Current Facility-Administered Medications  Medication Dose Route Frequency Provider Last Rate Last Dose  . acetaminophen (TYLENOL) tablet 650 mg  650 mg Oral Q6H PRN Rise Patience, MD   650 mg at 08/25/18 1437   Or  . acetaminophen (TYLENOL) suppository 650 mg  650 mg Rectal Q6H PRN Rise Patience, MD      . atorvastatin (LIPITOR) tablet 10 mg  10 mg Oral q1800 Rise Patience, MD      . insulin aspart (novoLOG) injection 0-9 Units  0-9 Units Subcutaneous TID WC Rise Patience, MD   2 Units at 08/25/18 1243  . morphine 2 MG/ML injection 1 mg  1 mg Intravenous Q4H PRN Rise Patience, MD      . ondansetron [facility]) tablet 4 mg  4 mg Oral Q6H PRN Rise Patience, MD       Or  . ondansetron [facility]) injection 4 mg  4 mg Intravenous Q6H PRN Rise Patience, MD         Discharge Medications: Please see  discharge summary for a list of discharge medications.  Relevant Imaging Results:  Relevant Lab Results:   Additional Information [id number] [place], [place]

## 2018-08-25 NOTE — Plan of Care (Signed)
  Problem: Education: Goal: Knowledge of General Education information will improve Description Including pain rating scale, medication(s)/side effects and non-pharmacologic comfort measures Outcome: Progressing   

## 2018-08-25 NOTE — ED Notes (Signed)
CBG 168. 

## 2018-08-26 DIAGNOSIS — M25551 Pain in right hip: Secondary | ICD-10-CM | POA: Diagnosis not present

## 2018-08-26 DIAGNOSIS — C189 Malignant neoplasm of colon, unspecified: Secondary | ICD-10-CM | POA: Diagnosis not present

## 2018-08-26 DIAGNOSIS — E119 Type 2 diabetes mellitus without complications: Secondary | ICD-10-CM | POA: Diagnosis not present

## 2018-08-26 DIAGNOSIS — I10 Essential (primary) hypertension: Secondary | ICD-10-CM

## 2018-08-26 DIAGNOSIS — W19XXXA Unspecified fall, initial encounter: Secondary | ICD-10-CM

## 2018-08-26 DIAGNOSIS — R52 Pain, unspecified: Secondary | ICD-10-CM

## 2018-08-26 LAB — BASIC METABOLIC PANEL
Anion gap: 7 (ref 5–15)
BUN: 13 mg/dL (ref 8–23)
CO2: 22 mmol/L (ref 22–32)
CREATININE: 0.72 mg/dL (ref 0.61–1.24)
Calcium: 8.3 mg/dL — ABNORMAL LOW (ref 8.9–10.3)
Chloride: 110 mmol/L (ref 98–111)
GFR calc Af Amer: >60 mL/min (ref >60)
GFR calc non Af Amer: >60 mL/min (ref >60)
Glucose, Bld: 144 mg/dL — ABNORMAL HIGH (ref 70–99)
Potassium: 4 mmol/L (ref 3.5–5.1)
SODIUM: 139 mmol/L (ref 135–145)

## 2018-08-26 LAB — CBC
HCT: 30 % — ABNORMAL LOW (ref 39.0–52.0)
Hemoglobin: 10 g/dL — ABNORMAL LOW (ref 13.0–17.0)
MCH: 31.3 pg (ref 26.0–34.0)
MCHC: 33.3 g/dL (ref 30.0–36.0)
MCV: 93.8 fL (ref 80.0–100.0)
Platelets: 85 10*3/uL — ABNORMAL LOW (ref 150–400)
RBC: 3.2 MIL/uL — ABNORMAL LOW (ref 4.22–5.81)
RDW: 14.3 % (ref 11.5–15.5)
WBC: 11.9 10*3/uL — ABNORMAL HIGH (ref 4.0–10.5)
nRBC: 0 % (ref 0.0–0.2)

## 2018-08-26 LAB — GLUCOSE, CAPILLARY
GLUCOSE-CAPILLARY: 148 mg/dL — AB (ref 70–99)
Glucose-Capillary: 150 mg/dL — ABNORMAL HIGH (ref 70–99)
Glucose-Capillary: 166 mg/dL — ABNORMAL HIGH (ref 70–99)
Glucose-Capillary: 160 mg/dL — ABNORMAL HIGH (ref 70–99)

## 2018-08-26 MED ORDER — TRAMADOL HCL 50 MG PO TABS: 50.0000 mg | ORAL_TABLET | Freq: Four times a day (QID) | ORAL | 0 refills | Status: DC | PRN

## 2018-08-26 MED ORDER — ACETAMINOPHEN 325 MG PO TABS: 650.0000 mg | ORAL_TABLET | Freq: Four times a day (QID) | ORAL | Status: DC | PRN
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
## 2018-08-26 NOTE — Care Management Obs Status (Signed)
MEDICARE OBSERVATION STATUS NOTIFICATION   Patient Details  Name: William Oneill MRN: [id number] Date of Birth: July 08, 1946   Medicare Observation Status Notification Given:  Yes    Marilu Favre, RN 08/26/2018, 12:44 PM

## 2018-08-27 ENCOUNTER — Telehealth: Payer: Self-pay | Admitting: Hematology

## 2018-08-27 DIAGNOSIS — I10 Essential (primary) hypertension: Secondary | ICD-10-CM | POA: Diagnosis not present

## 2018-08-27 DIAGNOSIS — S82832A Other fracture of upper and lower end of left fibula, initial encounter for closed fracture: Secondary | ICD-10-CM

## 2018-08-27 DIAGNOSIS — E119 Type 2 diabetes mellitus without complications: Secondary | ICD-10-CM | POA: Diagnosis not present

## 2018-08-27 DIAGNOSIS — C189 Malignant neoplasm of colon, unspecified: Secondary | ICD-10-CM | POA: Diagnosis not present

## 2018-08-27 DIAGNOSIS — W19XXXA Unspecified fall, initial encounter: Secondary | ICD-10-CM | POA: Diagnosis not present

## 2018-08-27 LAB — GLUCOSE, CAPILLARY
GLUCOSE-CAPILLARY: 176 mg/dL — AB (ref 70–99)
Glucose-Capillary: 150 mg/dL — ABNORMAL HIGH (ref 70–99)
Glucose-Capillary: 178 mg/dL — ABNORMAL HIGH (ref 70–99)
Glucose-Capillary: 147 mg/dL — ABNORMAL HIGH (ref 70–99)

## 2018-08-27 MED ORDER — TRAMADOL HCL 50 MG PO TABS
50.0000 mg | ORAL_TABLET | Freq: Four times a day (QID) | ORAL | Status: DC | PRN
  Administered 2018-08-27 – 2018-08-31 (×10): 50 mg via ORAL
  Filled 2018-08-27 (×10): qty 1

## 2018-08-27 NOTE — Clinical Social Work Note (Signed)
Visited with patient and was provided with facility choice - [facility]. Call made to admissions director Freda Munro and message left. Handoff will be left for weekend and unit CSW.  Niharika Savino Givens, MSW, LCSW Licensed Clinical Social Worker [place] [phone]

## 2018-08-27 NOTE — Telephone Encounter (Signed)
Called patient per 1/24 sch message - left message for patient to call back if r/s is still needed.
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
## 2018-08-27 NOTE — Plan of Care (Signed)
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
## 2018-08-28 DIAGNOSIS — M25551 Pain in right hip: Secondary | ICD-10-CM | POA: Diagnosis not present

## 2018-08-28 DIAGNOSIS — C189 Malignant neoplasm of colon, unspecified: Secondary | ICD-10-CM | POA: Diagnosis not present

## 2018-08-28 DIAGNOSIS — E119 Type 2 diabetes mellitus without complications: Secondary | ICD-10-CM | POA: Diagnosis not present

## 2018-08-28 DIAGNOSIS — S82832A Other fracture of upper and lower end of left fibula, initial encounter for closed fracture: Secondary | ICD-10-CM | POA: Diagnosis not present

## 2018-08-28 LAB — COMPREHENSIVE METABOLIC PANEL
ALK PHOS: 200 U/L — AB (ref 38–126)
ALT: 30 U/L (ref 0–44)
AST: 44 U/L — ABNORMAL HIGH (ref 15–41)
Albumin: 2.8 g/dL — ABNORMAL LOW (ref 3.5–5.0)
Anion gap: 8 (ref 5–15)
BUN: 20 mg/dL (ref 8–23)
CALCIUM: 8.3 mg/dL — AB (ref 8.9–10.3)
CO2: 24 mmol/L (ref 22–32)
CREATININE: 0.65 mg/dL (ref 0.61–1.24)
Chloride: 107 mmol/L (ref 98–111)
GFR calc Af Amer: >60 mL/min (ref >60)
GFR calc non Af Amer: >60 mL/min (ref >60)
Glucose, Bld: 159 mg/dL — ABNORMAL HIGH (ref 70–99)
Potassium: 3.6 mmol/L (ref 3.5–5.1)
Sodium: 139 mmol/L (ref 135–145)
Total Bilirubin: 2.3 mg/dL — ABNORMAL HIGH (ref 0.3–1.2)
Total Protein: 5.4 g/dL — ABNORMAL LOW (ref 6.5–8.1)

## 2018-08-28 LAB — GLUCOSE, CAPILLARY
Glucose-Capillary: 146 mg/dL — ABNORMAL HIGH (ref 70–99)
Glucose-Capillary: 250 mg/dL — ABNORMAL HIGH (ref 70–99)
Glucose-Capillary: 165 mg/dL — ABNORMAL HIGH (ref 70–99)
Glucose-Capillary: 150 mg/dL — ABNORMAL HIGH (ref 70–99)

## 2018-08-28 LAB — HEMOGLOBIN AND HEMATOCRIT, BLOOD
HCT: 30.3 % — ABNORMAL LOW (ref 39.0–52.0)
Hemoglobin: 10.1 g/dL — ABNORMAL LOW (ref 13.0–17.0)

## 2018-08-28 MED ORDER — BISACODYL 5 MG PO TBEC
5.0000 mg | DELAYED_RELEASE_TABLET | Freq: Every day | ORAL | Status: DC | PRN
  Administered 2018-08-30: 5 mg via ORAL
  Filled 2018-08-28: qty 1

## 2018-08-28 NOTE — Plan of Care (Signed)
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
## 2018-08-29 ENCOUNTER — Observation Stay (HOSPITAL_COMMUNITY): Payer: Medicare Other

## 2018-08-29 DIAGNOSIS — M25551 Pain in right hip: Secondary | ICD-10-CM | POA: Diagnosis not present

## 2018-08-29 DIAGNOSIS — S82832A Other fracture of upper and lower end of left fibula, initial encounter for closed fracture: Secondary | ICD-10-CM | POA: Diagnosis not present

## 2018-08-29 DIAGNOSIS — E119 Type 2 diabetes mellitus without complications: Secondary | ICD-10-CM | POA: Diagnosis not present

## 2018-08-29 DIAGNOSIS — C189 Malignant neoplasm of colon, unspecified: Secondary | ICD-10-CM | POA: Diagnosis not present

## 2018-08-29 LAB — CBC
HEMATOCRIT: 32.2 % — AB (ref 39.0–52.0)
Hemoglobin: 11 g/dL — ABNORMAL LOW (ref 13.0–17.0)
MCH: 32.2 pg (ref 26.0–34.0)
MCHC: 34.2 g/dL (ref 30.0–36.0)
MCV: 94.2 fL (ref 80.0–100.0)
Platelets: 126 10*3/uL — ABNORMAL LOW (ref 150–400)
RBC: 3.42 MIL/uL — ABNORMAL LOW (ref 4.22–5.81)
RDW: 14.7 % (ref 11.5–15.5)
WBC: 13.6 10*3/uL — ABNORMAL HIGH (ref 4.0–10.5)
nRBC: 0.2 % (ref 0.0–0.2)

## 2018-08-29 LAB — BASIC METABOLIC PANEL
Anion gap: 9 (ref 5–15)
BUN: 23 mg/dL (ref 8–23)
CHLORIDE: 102 mmol/L (ref 98–111)
CO2: 26 mmol/L (ref 22–32)
Calcium: 8.5 mg/dL — ABNORMAL LOW (ref 8.9–10.3)
Creatinine, Ser: 0.73 mg/dL (ref 0.61–1.24)
GFR calc Af Amer: >60 mL/min (ref >60)
GFR calc non Af Amer: >60 mL/min (ref >60)
Glucose, Bld: 173 mg/dL — ABNORMAL HIGH (ref 70–99)
Potassium: 4.4 mmol/L (ref 3.5–5.1)
Sodium: 137 mmol/L (ref 135–145)

## 2018-08-29 LAB — HEPATIC FUNCTION PANEL
ALBUMIN: 2.9 g/dL — AB (ref 3.5–5.0)
ALK PHOS: 223 U/L — AB (ref 38–126)
ALT: 28 U/L (ref 0–44)
AST: 39 U/L (ref 15–41)
Bilirubin, Direct: 0.8 mg/dL — ABNORMAL HIGH (ref 0.0–0.2)
Indirect Bilirubin: 2.2 mg/dL — ABNORMAL HIGH (ref 0.3–0.9)
Total Bilirubin: 3 mg/dL — ABNORMAL HIGH (ref 0.3–1.2)
Total Protein: 5.7 g/dL — ABNORMAL LOW (ref 6.5–8.1)

## 2018-08-29 LAB — GLUCOSE, CAPILLARY
GLUCOSE-CAPILLARY: 153 mg/dL — AB (ref 70–99)
Glucose-Capillary: 200 mg/dL — ABNORMAL HIGH (ref 70–99)
Glucose-Capillary: 152 mg/dL — ABNORMAL HIGH (ref 70–99)
Glucose-Capillary: 186 mg/dL — ABNORMAL HIGH (ref 70–99)

## 2018-08-29 LAB — GAMMA GT: GGT: 331 U/L — ABNORMAL HIGH (ref 7–50)

## 2018-08-29 NOTE — Plan of Care (Signed)
  Problem: Activity: Goal: Risk for activity intolerance will decrease Outcome: Progressing   Problem: Nutrition: Goal: Adequate nutrition will be maintained Outcome: Progressing   Problem: Pain Managment: Goal: General experience of comfort will improve Outcome: Progressing   Problem: Safety: Goal: Ability to remain free from injury will improve Outcome: Progressing   
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
## 2018-08-29 NOTE — Plan of Care (Signed)

## 2018-08-29 NOTE — Plan of Care (Signed)
  Problem: Education: Goal: Knowledge of General Education information will improve Description Including pain rating scale, medication(s)/side effects and non-pharmacologic comfort measures Outcome: Progressing   Problem: Education: Goal: Knowledge of General Education information will improve Description Including pain rating scale, medication(s)/side effects and non-pharmacologic comfort measures Outcome: Progressing   Problem: Health Behavior/Discharge Planning: Goal: Ability to manage health-related needs will improve Outcome: Progressing   Problem: Clinical Measurements: Goal: Ability to maintain clinical measurements within normal limits will improve Outcome: Progressing Goal: Will remain free from infection Outcome: Progressing Goal: Diagnostic test results will improve Outcome: Progressing   Problem: Activity: Goal: Risk for activity intolerance will decrease Outcome: Progressing   Problem: Nutrition: Goal: Adequate nutrition will be maintained Outcome: Progressing   Problem: Coping: Goal: Level of anxiety will decrease Outcome: Progressing   Problem: Elimination: Goal: Will not experience complications related to bowel motility Outcome: Progressing Goal: Will not experience complications related to urinary retention Outcome: Progressing   Problem: Pain Managment: Goal: General experience of comfort will improve Outcome: Progressing   Problem: Safety: Goal: Ability to remain free from injury will improve Outcome: Progressing   Problem: Skin Integrity: Goal: Risk for impaired skin integrity will decrease Outcome: Progressing

## 2018-08-30 ENCOUNTER — Inpatient Hospital Stay: Payer: Medicare Other

## 2018-08-30 ENCOUNTER — Inpatient Hospital Stay: Payer: Medicare Other | Admitting: Hematology

## 2018-08-30 DIAGNOSIS — E119 Type 2 diabetes mellitus without complications: Secondary | ICD-10-CM | POA: Diagnosis not present

## 2018-08-30 DIAGNOSIS — S82832A Other fracture of upper and lower end of left fibula, initial encounter for closed fracture: Secondary | ICD-10-CM | POA: Diagnosis not present

## 2018-08-30 DIAGNOSIS — C189 Malignant neoplasm of colon, unspecified: Secondary | ICD-10-CM | POA: Diagnosis not present

## 2018-08-30 DIAGNOSIS — M25551 Pain in right hip: Secondary | ICD-10-CM | POA: Diagnosis not present

## 2018-08-30 LAB — GLUCOSE, CAPILLARY
Glucose-Capillary: 160 mg/dL — ABNORMAL HIGH (ref 70–99)
Glucose-Capillary: 189 mg/dL — ABNORMAL HIGH (ref 70–99)
Glucose-Capillary: 116 mg/dL — ABNORMAL HIGH (ref 70–99)
Glucose-Capillary: 145 mg/dL — ABNORMAL HIGH (ref 70–99)
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
## 2018-08-30 NOTE — Plan of Care (Signed)
  Problem: Education: Goal: Knowledge of General Education information will improve Description Including pain rating scale, medication(s)/side effects and non-pharmacologic comfort measures Outcome: Progressing   Problem: Education: Goal: Knowledge of General Education information will improve Description Including pain rating scale, medication(s)/side effects and non-pharmacologic comfort measures Outcome: Progressing   Problem: Activity: Goal: Risk for activity intolerance will decrease Outcome: Progressing   Problem: Nutrition: Goal: Adequate nutrition will be maintained Outcome: Progressing   Problem: Coping: Goal: Level of anxiety will decrease Outcome: Progressing   Problem: Elimination: Goal: Will not experience complications related to bowel motility Outcome: Progressing Goal: Will not experience complications related to urinary retention Outcome: Progressing   Problem: Pain Managment: Goal: General experience of comfort will improve Outcome: Progressing   Problem: Safety: Goal: Ability to remain free from injury will improve Outcome: Progressing   Problem: Skin Integrity: Goal: Risk for impaired skin integrity will decrease Outcome: Progressing

## 2018-08-30 NOTE — Plan of Care (Signed)

## 2018-08-30 NOTE — Social Work (Signed)
Authorization pending for [employer].   CSW continuing to follow for support with disposition when medically appropriate.  Westley Hummer, MSW, [place] [employer] [phone]
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
## 2018-08-31 ENCOUNTER — Encounter: Payer: Self-pay | Admitting: Hematology

## 2018-08-31 LAB — GLUCOSE, CAPILLARY
Glucose-Capillary: 162 mg/dL — ABNORMAL HIGH (ref 70–99)
Glucose-Capillary: 196 mg/dL — ABNORMAL HIGH (ref 70–99)

## 2018-08-31 MED ORDER — OXYCODONE HCL 5 MG PO TABS: 5.0000 mg | ORAL_TABLET | Freq: Three times a day (TID) | ORAL | 0 refills | Status: DC | PRN

## 2018-08-31 NOTE — Progress Notes (Signed)
Attempted to call report to [facility] without success.  Call answered and transferred, but not picked up.  Will try again.  Discharge instructions reviewed with pt and given to PTAR.  Pt verbalized understanding and had no questions.  Pt discharged in stable condition via [facility] to [facility].  Eliezer Bottom Gillespie

## 2018-08-31 NOTE — Discharge Instructions (Signed)
Hematoma  A hematoma is a collection of blood. A hematoma can happen:  · Under the skin.  · In an organ.  · In a body space.  · In a joint space.  · In other tissues.  The blood can thicken (clot) to form a lump that you can see and feel. The lump is often hard and may become sore and tender. The lump can be very small or very big. Most hematomas get better in a few days to weeks. However, some hematomas may be serious and need medical care.  What are the causes?  This condition is caused by:  · An injury.  · Blood that leaks under the skin.  · Problems from surgeries.  · Medical conditions that cause bleeding or bruising.  What increases the risk?  You are more likely to develop this condition if:  · You are an older adult.  · You use medicines that thin your blood.  What are the signs or symptoms?  Symptoms depend on where the hematoma is in your body.  · If the hematoma is under the skin, there is:  ? A firm lump on the body.  ? Pain and tenderness in the area.  ? Bruising. The skin above the lump may be blue, dark blue, purple-red, or yellowish.  · If the hematoma is deep in the tissues or body spaces, there may be:  ? Blood in the stomach. This may cause pain in the belly (abdomen), weakness, passing out (fainting), and shortness of breath.  ? Blood in the head. This may cause a headache, weakness, trouble speaking or understanding speech, or passing out.  How is this diagnosed?  This condition is diagnosed based on:  · Your medical history.  · A physical exam.  · Imaging tests, such as ultrasound or CT scan.  · Blood tests.  How is this treated?  Treatment depends on the cause, size, and location of the hematoma. Treatment may include:  · Doing nothing. Many hematomas go away on their own without treatment.  · Surgery or close monitoring. This may be needed for large hematomas or hematomas that affect the body's organs.  · Medicines. These may be given if a medical condition caused the hematoma.  Follow these  instructions at home:  Managing pain, stiffness, and swelling    · If told, put ice on the area.  ? Put ice in a plastic bag.  ? Place a towel between your skin and the bag.  ? Leave the ice on for 20 minutes, 2-3 times a day for the first two days.  · If told, put heat on the affected area after putting ice on the area for two days. Use the heat source that your doctor tells you to use. This could be a moist heat pack or a heating pad. To do this:  ? Place a towel between your skin and the heat source.  ? Leave the heat on for 20-30 minutes.  ? Remove the heat if your skin turns bright red. This is very important if you are unable to feel pain, heat, or cold. You may have a greater risk of getting burned.  · Raise (elevate) the affected area above the level of your heart while you are sitting or lying down.  · Wrap the affected area with an elastic bandage, if told by your doctor. Do not wrap the bandage too tightly.  · If your hematoma is on a leg or foot and   is painful, your doctor may give you crutches. Use them as told by your doctor.  General instructions  · Take over-the-counter and prescription medicines only as told by your doctor.  · Keep all follow-up visits as told by your doctor. This is important.  Contact a doctor if:  · You have a fever.  · The swelling or bruising gets worse.  · You start to get more hematomas.  Get help right away if:  · Your pain gets worse.  · Your pain is not getting better with medicine.  · Your skin over the hematoma breaks or starts to bleed.  · Your hematoma is in your chest or belly and you:  ? Pass out.  ? Feel weak.  ? Become short of breath.  · You have a hematoma on your scalp that is caused by a fall or injury, and you:  ? Have a headache that gets worse.  ? Have trouble speaking or understanding speech.  ? Become less alert or you pass out.  Summary  · A hematoma is a collection of blood in any part of your body.  · Most hematomas get better on their own in a few days  to weeks. Some may need medical care.  · Follow instructions from your doctor about how to care for your hematoma.  · Contact a doctor if the swelling or bruising gets worse, or if you are short of breath.  This information is not intended to replace advice given to you by your health care provider. Make sure you discuss any questions you have with your health care provider.  Document Released: 08/28/2004 Document Revised: 12/24/2017 Document Reviewed: 12/24/2017  Elsevier Interactive Patient Education © 2019 [employer]vier Inc[employer]
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
## 2018-08-31 NOTE — Progress Notes (Signed)
Attempted again to call report @ 1255 and still did not get an answer.  William Oneill

## 2018-08-31 NOTE — Clinical Social Work Placement (Addendum)
   CLINICAL SOCIAL WORK PLACEMENT  NOTE  Mendel Corning RN to call report to [phone]  Date:  08/31/2018  Patient Details  Name: William Oneill MRN: [id number] Date of Birth: Mar 01, 1946  Clinical Social Work is seeking post-discharge placement for this patient at the [place] level of care (*CSW will initial, date and re-position this form in  chart as items are completed):  Yes   Patient/family provided with [place] Work Department's list of facilities offering this level of care within the geographic area requested by the patient (or if unable, by the patient's family).  Yes   Patient/family informed of their freedom to choose among providers that offer the needed level of care, that participate in Medicare, Medicaid or managed care program needed by the patient, have an available bed and are willing to accept the patient.  Yes   Patient/family informed of [facility]'s ownership interest in [facility] and [facility], as well as of the fact that they are under no obligation to receive care at these facilities.  PASRR submitted to EDS on       PASRR number received on 08/25/18     Existing PASRR number confirmed on       FL2 transmitted to all facilities in geographic area requested by pt/family on 08/25/18     FL2 transmitted to all facilities within larger geographic area on       Patient informed that his/her managed care company has contracts with or will negotiate with certain facilities, including the following:        Yes   Patient/family informed of bed offers received.  Patient chooses bed at [facility]     Physician recommends and patient chooses bed at      Patient to be transferred to [facility] on 08/31/18.  Patient to be transferred to facility by PTAR     Patient family notified on 08/31/18 of transfer.  Name of family member notified:  Pt A&Ox4, pt sister [name] also called on telephone    PHYSICIAN        Additional Comment:    _______________________________________________ [place], [place] 08/31/2018, 10:16 AM

## 2018-08-31 NOTE — Social Work (Signed)
Clinical Social Worker facilitated patient discharge including contacting patient family and facility to confirm patient discharge plans.  Clinical information faxed to facility and family agreeable with plan.  CSW arranged ambulance transport via [place] to [facility] room [place] to call [phone]  with report prior to discharge.  Clinical Social Worker will sign off for now as social work intervention is no longer needed. Please consult [place] again if new need arises.  Westley Hummer, MSW, [place] Social Worker [phone]

## 2018-09-16 ENCOUNTER — Ambulatory Visit: Payer: Medicare Other | Admitting: Family Medicine

## 2018-09-21 ENCOUNTER — Telehealth: Payer: Self-pay | Admitting: Family Medicine

## 2018-09-21 NOTE — Telephone Encounter (Signed)
Copied from [place] #[id number]. Topic: Quick Communication - Home Health Verbal Orders >> Sep 21, 2018 12:54 PM Blase Mess A wrote: Caller/Agency: [name] Callback Number: [phone] Requesting OT/PT/Skilled Nursing/Social Work: OT Frequency: 2 week 2, 1 week 3
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
## 2018-09-21 NOTE — Telephone Encounter (Signed)
Copied from [place] [id number]. Topic: Quick Communication - Rx Refill/Question >> Sep 21, 2018 12:57 PM Blase Mess A wrote: Medication: oxyCODONE (ROXICODONE) 5 MG immediate release tablet [[id number]] , traMADol (ULTRAM) 50 MG tablet [[id number]]   Has the patient contacted their pharmacy? Yes  (Agent: If no, request that the patient contact the pharmacy for the refill.) (Agent: If yes, when and what did the pharmacy advise?)  Preferred Pharmacy (with phone number or street name): [place], [place] - [address] [phone] (Phone) [phone] (Fax)    Agent: Please be advised that RX refills may take up to 3 business days. We ask that you follow-up with your pharmacy.

## 2018-09-21 NOTE — Telephone Encounter (Signed)
Called and gave verbal orders for pt OT.

## 2018-09-22 MED ORDER — TRAMADOL HCL 50 MG PO TABS: 50.0000 mg | ORAL_TABLET | Freq: Four times a day (QID) | ORAL | 0 refills | Status: DC | PRN
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
## 2018-09-27 ENCOUNTER — Telehealth: Payer: Self-pay | Admitting: Hematology

## 2018-09-27 NOTE — Telephone Encounter (Signed)
Called patient per scheduling VM log.  Rescheduled patient missed appt from 01/27.

## 2018-09-27 NOTE — Telephone Encounter (Signed)
Spoke with pt about concerns and he informed me that he has an appointment already for Wednesday with Dr. Carlota Raspberry. I verbalized understanding.

## 2018-09-29 ENCOUNTER — Ambulatory Visit (INDEPENDENT_AMBULATORY_CARE_PROVIDER_SITE_OTHER): Payer: Medicare Other | Admitting: Family Medicine

## 2018-09-29 ENCOUNTER — Encounter: Payer: Self-pay | Admitting: Family Medicine

## 2018-09-29 ENCOUNTER — Other Ambulatory Visit: Payer: Self-pay

## 2018-09-29 VITALS — BP 138/66 | HR 67 | Temp 98.3°F | Resp 14 | Ht 66.0 in | Wt 145.6 lb

## 2018-09-29 DIAGNOSIS — IMO0001 Reserved for inherently not codable concepts without codable children: Secondary | ICD-10-CM

## 2018-09-29 DIAGNOSIS — E1165 Type 2 diabetes mellitus with hyperglycemia: Secondary | ICD-10-CM | POA: Diagnosis not present

## 2018-09-29 DIAGNOSIS — D696 Thrombocytopenia, unspecified: Secondary | ICD-10-CM | POA: Diagnosis not present

## 2018-09-29 LAB — GLUCOSE, POCT (MANUAL RESULT ENTRY): POC Glucose: 180 mg/dl — AB (ref 70–99)

## 2018-09-29 MED ORDER — SITAGLIPTIN PHOSPHATE 100 MG PO TABS: 100.0000 mg | ORAL_TABLET | Freq: Every day | ORAL | 1 refills | Status: DC

## 2018-09-29 NOTE — Patient Instructions (Addendum)
I'm glad to hear you are improving. Tramadol if needed, let me know if refills are needed  I will recheck bilirubin today, but follow up with your gastroenterologist Dr. Benson Norway. Call for appointment if one has not been scheduled.   I will recheck blood counts today as well.   Depending on home blood sugars we may need to consider restarting insulin but at this point chest continue metformin and Januvia.  Try to check your blood sugar once per day if possible and let me know if those readings start going up.  Follow-up with me in 1 month.  I am sorry you have gone through everything you have lately.  Please let me know if you are feeling down, depressed, or would like to meet with somebody to talk about some of what is going on. I am happy to talk to you further if needed as well.    If you have lab work done today you will be contacted with your lab results within the next 2 weeks.  If you have not heard from [place] then please contact us. The fastest way to get your results is to register for My Chart.   IF you received an x-ray today, you will receive an invoice from [facility]. Please contact [facility] at [phone] with questions or concerns regarding your invoice.   IF you received labwork today, you will receive an invoice from [facility]. Please contact [facility] at [phone] with questions or concerns regarding your invoice.   Our billing staff will not be able to assist you with questions regarding bills from these companies.  You will be contacted with the lab results as soon as they are available. The fastest way to get your results is to activate your My Chart account. Instructions are located on the last page of this paperwork. If you have not heard from [place] regarding the results in 2 weeks, please contact this office.
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
## 2018-09-30 LAB — COMPREHENSIVE METABOLIC PANEL
ALBUMIN: 4 g/dL (ref 3.7–4.7)
ALT: 24 IU/L (ref 0–44)
AST: 31 IU/L (ref 0–40)
Albumin/Globulin Ratio: 1.5 (ref 1.2–2.2)
Alkaline Phosphatase: 307 IU/L — ABNORMAL HIGH (ref 39–117)
BILIRUBIN TOTAL: 1 mg/dL (ref 0.0–1.2)
BUN / CREAT RATIO: 26 — AB (ref 10–24)
BUN: 18 mg/dL (ref 8–27)
CO2: 25 mmol/L (ref 20–29)
Calcium: 9.4 mg/dL (ref 8.6–10.2)
Chloride: 105 mmol/L (ref 96–106)
Creatinine, Ser: 0.69 mg/dL — ABNORMAL LOW (ref 0.76–1.27)
GFR calc Af Amer: 110 mL/min/{1.73_m2} (ref >59)
GFR calc non Af Amer: 96 mL/min/{1.73_m2} (ref >59)
Globulin, Total: 2.6 g/dL (ref 1.5–4.5)
Glucose: 180 mg/dL — ABNORMAL HIGH (ref 65–99)
Potassium: 4.4 mmol/L (ref 3.5–5.2)
Sodium: 142 mmol/L (ref 134–144)
Total Protein: 6.6 g/dL (ref 6.0–8.5)

## 2018-09-30 LAB — CBC
Hematocrit: 37.6 % (ref 37.5–51.0)
Hemoglobin: 12.6 g/dL — ABNORMAL LOW (ref 13.0–17.7)
MCH: 31.9 pg (ref 26.6–33.0)
MCHC: 33.5 g/dL (ref 31.5–35.7)
MCV: 95 fL (ref 79–97)
Platelets: 186 10*3/uL (ref 150–450)
RBC: 3.95 x10E6/uL — ABNORMAL LOW (ref 4.14–5.80)
RDW: 13.2 % (ref 11.6–15.4)
WBC: 5.1 10*3/uL (ref 3.4–10.8)

## 2018-09-30 LAB — HEMOGLOBIN A1C
Est. average glucose Bld gHb Est-mCnc: 131 mg/dL
Hgb A1c MFr Bld: 6.2 % — ABNORMAL HIGH (ref 4.8–5.6)

## 2018-10-01 ENCOUNTER — Encounter: Payer: Self-pay | Admitting: Family Medicine

## 2018-10-06 ENCOUNTER — Ambulatory Visit (INDEPENDENT_AMBULATORY_CARE_PROVIDER_SITE_OTHER): Payer: Medicare Other | Admitting: Family Medicine

## 2018-10-06 ENCOUNTER — Other Ambulatory Visit: Payer: Self-pay

## 2018-10-06 ENCOUNTER — Encounter: Payer: Self-pay | Admitting: Family Medicine

## 2018-10-06 VITALS — BP 140/64 | HR 60 | Temp 98.2°F | Resp 14 | Ht 66.0 in | Wt 152.6 lb

## 2018-10-06 DIAGNOSIS — E119 Type 2 diabetes mellitus without complications: Secondary | ICD-10-CM

## 2018-10-06 DIAGNOSIS — M25551 Pain in right hip: Secondary | ICD-10-CM | POA: Diagnosis not present

## 2018-10-06 DIAGNOSIS — G4700 Insomnia, unspecified: Secondary | ICD-10-CM

## 2018-10-06 LAB — GLUCOSE, POCT (MANUAL RESULT ENTRY): POC Glucose: 229 mg/dL — AB (ref 70–99)

## 2018-10-06 MED ORDER — TRAZODONE HCL 50 MG PO TABS: 25.0000 mg | ORAL_TABLET | Freq: Every evening | ORAL | 1 refills | Status: DC | PRN

## 2018-10-06 MED ORDER — TRAMADOL HCL 50 MG PO TABS: 50.0000 mg | ORAL_TABLET | Freq: Four times a day (QID) | ORAL | 0 refills | Status: DC | PRN

## 2018-10-06 NOTE — Patient Instructions (Addendum)
Based on your last hemoglobin A1c of 6.2 I think it is reasonable to continue the metformin and Januvia alone for now.  If you notice persistent readings in the 200s we need to change medications, so let me know.  Otherwise continue to watch diet and activity as tolerated with your other aches and pains.  Recheck with me in the next 4 to 6 weeks.  Tramadol refilled if needed.  Try trazodone 1/2 - 1 pill at bedtime to help with sleep.   Recheck in 1 month.    Insomnia Insomnia is a sleep disorder that makes it difficult to fall asleep or stay asleep. Insomnia can cause fatigue, low energy, difficulty concentrating, mood swings, and poor performance at work or school. There are three different ways to classify insomnia:  Difficulty falling asleep.  Difficulty staying asleep.  Waking up too early in the morning. Any type of insomnia can be long-term (chronic) or short-term (acute). Both are common. Short-term insomnia usually lasts for three months or less. Chronic insomnia occurs at least three times a week for longer than three months. What are the causes? Insomnia may be caused by another condition, situation, or substance, such as:  Anxiety.  Certain medicines.  Gastroesophageal reflux disease (GERD) or other gastrointestinal conditions.  Asthma or other breathing conditions.  Restless legs syndrome, sleep apnea, or other sleep disorders.  Chronic pain.  Menopause.  Stroke.  Abuse of alcohol, tobacco, or illegal drugs.  Mental health conditions, such as depression.  Caffeine.  Neurological disorders, such as Alzheimer's disease.  An overactive thyroid (hyperthyroidism). Sometimes, the cause of insomnia may not be known. What increases the risk? Risk factors for insomnia include:  Gender. Women are affected more often than men.  Age. Insomnia is more common as you get older.  Stress.  Lack of exercise.  Irregular work schedule or working night  shifts.  Traveling between different time zones.  Certain medical and mental health conditions. What are the signs or symptoms? If you have insomnia, the main symptom is having trouble falling asleep or having trouble staying asleep. This may lead to other symptoms, such as:  Feeling fatigued or having low energy.  Feeling nervous about going to sleep.  Not feeling rested in the morning.  Having trouble concentrating.  Feeling irritable, anxious, or depressed. How is this diagnosed? This condition may be diagnosed based on:  Your symptoms and medical history. Your health care provider may ask about: ? Your sleep habits. ? Any medical conditions you have. ? Your mental health.  A physical exam. How is this treated? Treatment for insomnia depends on the cause. Treatment may focus on treating an underlying condition that is causing insomnia. Treatment may also include:  Medicines to help you sleep.  Counseling or therapy.  Lifestyle adjustments to help you sleep better. Follow these instructions at home: Eating and drinking   Limit or avoid alcohol, caffeinated beverages, and cigarettes, especially close to bedtime. These can disrupt your sleep.  Do not eat a large meal or eat spicy foods right before bedtime. This can lead to digestive discomfort that can make it hard for you to sleep. Sleep habits   Keep a sleep diary to help you and your health care provider figure out what could be causing your insomnia. Write down: ? When you sleep. ? When you wake up during the night. ? How well you sleep. ? How rested you feel the next day. ? Any side effects of medicines you are taking. ?  What you eat and drink.  Make your bedroom a dark, comfortable place where it is easy to fall asleep. ? Put up shades or blackout curtains to block light from outside. ? Use a white noise machine to block noise. ? Keep the temperature cool.  Limit screen use before bedtime. This  includes: ? Watching TV. ? Using your smartphone, tablet, or computer.  Stick to a routine that includes going to bed and waking up at the same times every day and night. This can help you fall asleep faster. Consider making a quiet activity, such as reading, part of your nighttime routine.  Try to avoid taking naps during the day so that you sleep better at night.  Get out of bed if you are still awake after 15 minutes of trying to sleep. Keep the lights down, but try reading or doing a quiet activity. When you feel sleepy, go back to bed. General instructions  Take over-the-counter and prescription medicines only as told by your health care provider.  Exercise regularly, as told by your health care provider. Avoid exercise starting several hours before bedtime.  Use relaxation techniques to manage stress. Ask your health care provider to suggest some techniques that may work well for you. These may include: ? Breathing exercises. ? Routines to release muscle tension. ? Visualizing peaceful scenes.  Make sure that you drive carefully. Avoid driving if you feel very sleepy.  Keep all follow-up visits as told by your health care provider. This is important. Contact a health care provider if:  You are tired throughout the day.  You have trouble in your daily routine due to sleepiness.  You continue to have sleep problems, or your sleep problems get worse. Get help right away if:  You have serious thoughts about hurting yourself or someone else. If you ever feel like you may hurt yourself or others, or have thoughts about taking your own life, get help right away. You can go to your nearest emergency department or call:  Your local emergency services (911 in the [place]  A suicide crisis helpline, such as the [facility] at (585) 528-7244. This is open 24 hours a day. Summary  Insomnia is a sleep disorder that makes it difficult to fall asleep or stay  asleep.  Insomnia can be long-term (chronic) or short-term (acute).  Treatment for insomnia depends on the cause. Treatment may focus on treating an underlying condition that is causing insomnia.  Keep a sleep diary to help you and your health care provider figure out what could be causing your insomnia. This information is not intended to replace advice given to you by your health care provider. Make sure you discuss any questions you have with your health care provider. Document Released: 07/18/2000 Document Revised: 04/30/2017 Document Reviewed: 04/30/2017 Elsevier Interactive Patient Education  [employer].     If you have lab work done today you will be contacted with your lab results within the next 2 weeks.  If you have not heard from [place] then please contact us. The fastest way to get your results is to register for My Chart.   IF you received an x-ray today, you will receive an invoice from [facility]. Please contact [facility] at [phone] with questions or concerns regarding your invoice.   IF you received labwork today, you will receive an invoice from [place]. Please contact [facility] at [phone] with questions or concerns regarding your invoice.   Our billing staff will not  be able to assist you with questions regarding bills from these companies.  You will be contacted with the lab results as soon as they are available. The fastest way to get your results is to activate your My Chart account. Instructions are located on the last page of this paperwork. If you have not heard from [place] regarding the results in 2 weeks, please contact this office.
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
## 2018-10-11 ENCOUNTER — Encounter: Payer: Self-pay | Admitting: Hematology

## 2018-10-11 ENCOUNTER — Telehealth: Payer: Self-pay | Admitting: Hematology

## 2018-10-11 ENCOUNTER — Inpatient Hospital Stay (HOSPITAL_BASED_OUTPATIENT_CLINIC_OR_DEPARTMENT_OTHER): Payer: Medicare Other | Admitting: Hematology

## 2018-10-11 ENCOUNTER — Inpatient Hospital Stay: Payer: Medicare Other

## 2018-10-11 ENCOUNTER — Inpatient Hospital Stay: Payer: Medicare Other | Attending: Hematology

## 2018-10-11 VITALS — BP 136/72 | HR 70 | Temp 98.0°F | Resp 18 | Ht 66.0 in | Wt 155.9 lb

## 2018-10-11 DIAGNOSIS — Z85038 Personal history of other malignant neoplasm of large intestine: Secondary | ICD-10-CM | POA: Insufficient documentation

## 2018-10-11 DIAGNOSIS — D696 Thrombocytopenia, unspecified: Secondary | ICD-10-CM | POA: Diagnosis not present

## 2018-10-11 DIAGNOSIS — C180 Malignant neoplasm of cecum: Secondary | ICD-10-CM

## 2018-10-11 DIAGNOSIS — D509 Iron deficiency anemia, unspecified: Secondary | ICD-10-CM | POA: Insufficient documentation

## 2018-10-11 DIAGNOSIS — E119 Type 2 diabetes mellitus without complications: Secondary | ICD-10-CM | POA: Diagnosis not present

## 2018-10-11 DIAGNOSIS — K769 Liver disease, unspecified: Secondary | ICD-10-CM

## 2018-10-11 DIAGNOSIS — Z95828 Presence of other vascular implants and grafts: Secondary | ICD-10-CM

## 2018-10-11 LAB — CBC WITH DIFFERENTIAL (CANCER CENTER ONLY)
Abs Immature Granulocytes: 0.01 10*3/uL (ref 0.00–0.07)
BASOS PCT: 1 %
Basophils Absolute: 0.1 10*3/uL (ref 0.0–0.1)
Eosinophils Absolute: 0.3 10*3/uL (ref 0.0–0.5)
Eosinophils Relative: 7 %
HCT: 35.6 % — ABNORMAL LOW (ref 39.0–52.0)
Hemoglobin: 11.7 g/dL — ABNORMAL LOW (ref 13.0–17.0)
Immature Granulocytes: 0 %
Lymphocytes Relative: 29 %
Lymphs Abs: 1.2 10*3/uL (ref 0.7–4.0)
MCH: 31.3 pg (ref 26.0–34.0)
MCHC: 32.9 g/dL (ref 30.0–36.0)
MCV: 95.2 fL (ref 80.0–100.0)
Monocytes Absolute: 0.4 10*3/uL (ref 0.1–1.0)
Monocytes Relative: 10 %
NEUTROS PCT: 53 %
Neutro Abs: 2.2 10*3/uL (ref 1.7–7.7)
Platelet Count: 145 10*3/uL — ABNORMAL LOW (ref 150–400)
RBC: 3.74 MIL/uL — ABNORMAL LOW (ref 4.22–5.81)
RDW: 12.6 % (ref 11.5–15.5)
WBC Count: 4.2 10*3/uL (ref 4.0–10.5)
nRBC: 0 % (ref 0.0–0.2)

## 2018-10-11 LAB — CEA (IN HOUSE-CHCC): CEA (CHCC-In House): 1.28 ng/mL (ref 0.00–5.00)

## 2018-10-11 LAB — COMPREHENSIVE METABOLIC PANEL
ALBUMIN: 3.6 g/dL (ref 3.5–5.0)
ALT: 24 U/L (ref 0–44)
AST: 31 U/L (ref 15–41)
Alkaline Phosphatase: 197 U/L — ABNORMAL HIGH (ref 38–126)
Anion gap: 8 (ref 5–15)
BILIRUBIN TOTAL: 1.1 mg/dL (ref 0.3–1.2)
BUN: 22 mg/dL (ref 8–23)
CO2: 26 mmol/L (ref 22–32)
Calcium: 9 mg/dL (ref 8.9–10.3)
Chloride: 108 mmol/L (ref 98–111)
Creatinine, Ser: 0.79 mg/dL (ref 0.61–1.24)
GFR calc Af Amer: >60 mL/min (ref >60)
GFR calc non Af Amer: >60 mL/min (ref >60)
GLUCOSE: 204 mg/dL — AB (ref 70–99)
Potassium: 3.9 mmol/L (ref 3.5–5.1)
Sodium: 142 mmol/L (ref 135–145)
Total Protein: 6.6 g/dL (ref 6.5–8.1)

## 2018-10-11 MED ORDER — HEPARIN SOD (PORK) LOCK FLUSH 100 UNIT/ML IV SOLN
500.0000 [IU] | Freq: Once | INTRAVENOUS | Status: AC | PRN
  Administered 2018-10-11: 500 [IU]
  Filled 2018-10-11: qty 5

## 2018-10-11 MED ORDER — SODIUM CHLORIDE 0.9% FLUSH
10.0000 mL | INTRAVENOUS | Status: DC | PRN
  Administered 2018-10-11: 10 mL
  Filled 2018-10-11: qty 10

## 2018-10-11 NOTE — Telephone Encounter (Signed)
Scheduled appt per 3/9 los - gave patient AVS and calender per los.

## 2018-10-26 ENCOUNTER — Other Ambulatory Visit: Payer: Self-pay | Admitting: Family Medicine

## 2018-10-26 DIAGNOSIS — M25551 Pain in right hip: Secondary | ICD-10-CM

## 2018-11-01 ENCOUNTER — Other Ambulatory Visit: Payer: Self-pay

## 2018-11-01 NOTE — Patient Outreach (Signed)
[place] [facility]) Care Management  11/01/2018  William Oneill 1945-11-30 [id number]   Medication Adherence call to [name] spoke with patients sister she explain he is now living with his sister and to call him on his cell number patient number is block. [name] is showing due on Januvia 100 mg under [employer].  [place] Management Direct Dial [phone]  Fax [phone] Shafter [email or web address]

## 2018-11-02 ENCOUNTER — Telehealth: Payer: Self-pay | Admitting: Family Medicine

## 2018-11-02 NOTE — Telephone Encounter (Signed)
Copied from [place] [phone]. Topic: General - Call Back - No Documentation >> Nov 02, 2018  3:10 PM [name], Marland Kitchen wrote: Reason for CRM: pt says he is returning call to [place]  Please call

## 2018-11-05 ENCOUNTER — Ambulatory Visit: Payer: Medicare Other | Admitting: Family Medicine

## 2018-11-17 ENCOUNTER — Ambulatory Visit: Payer: Medicare Other | Admitting: Family Medicine

## 2018-11-22 ENCOUNTER — Other Ambulatory Visit: Payer: Self-pay

## 2018-11-22 ENCOUNTER — Inpatient Hospital Stay: Payer: Medicare Other | Attending: Hematology

## 2018-11-22 DIAGNOSIS — Z95828 Presence of other vascular implants and grafts: Secondary | ICD-10-CM

## 2018-11-22 DIAGNOSIS — C180 Malignant neoplasm of cecum: Secondary | ICD-10-CM | POA: Diagnosis present

## 2018-11-22 MED ORDER — HEPARIN SOD (PORK) LOCK FLUSH 100 UNIT/ML IV SOLN
500.0000 [IU] | Freq: Once | INTRAVENOUS | Status: AC | PRN
  Administered 2018-11-22: 500 [IU]
  Filled 2018-11-22: qty 5

## 2018-11-22 MED ORDER — SODIUM CHLORIDE 0.9% FLUSH
10.0000 mL | INTRAVENOUS | Status: DC | PRN
  Administered 2018-11-22: 10 mL
  Filled 2018-11-22: qty 10

## 2018-11-22 NOTE — Telephone Encounter (Signed)
Calling pt to do his pre triage before appt but no answer.  Since call pt has been seen and has appt with greene on 11/23/2018.

## 2018-11-23 ENCOUNTER — Encounter: Payer: Self-pay | Admitting: Family Medicine

## 2018-11-23 ENCOUNTER — Ambulatory Visit (INDEPENDENT_AMBULATORY_CARE_PROVIDER_SITE_OTHER): Payer: Medicare Other | Admitting: Family Medicine

## 2018-11-23 ENCOUNTER — Other Ambulatory Visit: Payer: Self-pay

## 2018-11-23 VITALS — BP 140/73 | HR 67 | Temp 98.0°F | Resp 14 | Wt 155.0 lb

## 2018-11-23 DIAGNOSIS — M79605 Pain in left leg: Secondary | ICD-10-CM

## 2018-11-23 DIAGNOSIS — G4700 Insomnia, unspecified: Secondary | ICD-10-CM

## 2018-11-23 DIAGNOSIS — M25551 Pain in right hip: Secondary | ICD-10-CM | POA: Diagnosis not present

## 2018-11-23 DIAGNOSIS — E119 Type 2 diabetes mellitus without complications: Secondary | ICD-10-CM

## 2018-11-23 LAB — GLUCOSE, POCT (MANUAL RESULT ENTRY): POC Glucose: 218 mg/dl — AB (ref 70–99)

## 2018-11-23 MED ORDER — TRAMADOL HCL 50 MG PO TABS: 50.0000 mg | ORAL_TABLET | Freq: Four times a day (QID) | ORAL | 0 refills | Status: DC | PRN

## 2018-11-23 MED ORDER — TRAZODONE HCL 100 MG PO TABS: 50.0000 mg | ORAL_TABLET | Freq: Every day | ORAL | 1 refills | Status: DC

## 2018-11-23 MED ORDER — INSULIN GLARGINE 100 UNIT/ML SOLOSTAR PEN: 5.0000 [IU] | PEN_INJECTOR | Freq: Every day | SUBCUTANEOUS | 3 refills | Status: DC

## 2018-11-23 NOTE — Patient Instructions (Addendum)
  If blood sugars remain over 200, then restart low dose insulin. For now ok to continue Januvia and metformin at same dose. Watch for low blood sugars if you restart insulin.   You can try higher dose of trazodone to see if that will help sleep. If new side effects, or no change in effectiveness, return to 1/2 pill (50mg ).   Tylenol if needed for milder pain, tramadol only if needed for more severe pain. DO NOT take tramadol if taking trazodone at night.   Follow up with me in 2-3 months for repeat testing.  I am happy to talk to you sooner if needed, and we can provide a telemedicine visit.   Return to the clinic or go to the nearest emergency room if any of your symptoms worsen or new symptoms occur.   If you have lab work done today you will be contacted with your lab results within the next 2 weeks.  If you have not heard from [place] then please contact us. The fastest way to get your results is to register for My Chart.   IF you received an x-ray today, you will receive an invoice from [facility]. Please contact [facility] at [phone] with questions or concerns regarding your invoice.   IF you received labwork today, you will receive an invoice from [facility]. Please contact [facility] at [phone] with questions or concerns regarding your invoice.   Our billing staff will not be able to assist you with questions regarding bills from these companies.  You will be contacted with the lab results as soon as they are available. The fastest way to get your results is to activate your My Chart account. Instructions are located on the last page of this paperwork. If you have not heard from [place] regarding the results in 2 weeks, please contact this office.
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
## 2018-12-06 ENCOUNTER — Telehealth: Payer: Self-pay | Admitting: Family Medicine

## 2018-12-06 NOTE — Telephone Encounter (Signed)
Copied from [place] [id number]. Topic: Quick Communication - See Telephone Encounter >> Dec 06, 2018 10:13 AM Blase Mess A wrote: CRM for notification. See Telephone encounter for: 12/06/18.  Patient would like to drop off paperwork for a handicap sticker. Please advise thank you

## 2018-12-06 NOTE — Telephone Encounter (Signed)
In Providers box

## 2018-12-07 ENCOUNTER — Telehealth: Payer: Self-pay | Admitting: Hematology

## 2018-12-07 ENCOUNTER — Other Ambulatory Visit: Payer: Self-pay | Admitting: Hematology

## 2018-12-07 NOTE — Telephone Encounter (Signed)
Moved 6/8 lab/port to 6/5 to coordinate with mri per central radiology. Central radiology will inform patient.

## 2018-12-08 NOTE — Telephone Encounter (Signed)
Pt checking on paperwork. Please mail to pt when ready. [place] [phone]

## 2018-12-15 NOTE — Telephone Encounter (Signed)
I talked with pt to let him know that his parking placard form is ready for pick up at the front desk. Pt states thank you

## 2018-12-20 IMAGING — CR DG CHEST 1V PORT
1 series · 1 of 1 positions shown · non-contrast
Comparison: 11/06/2017

CLINICAL DATA: Postop evaluation left Port-A-Cath.

EXAM:
PORTABLE CHEST 1 VIEW

[chest ap]
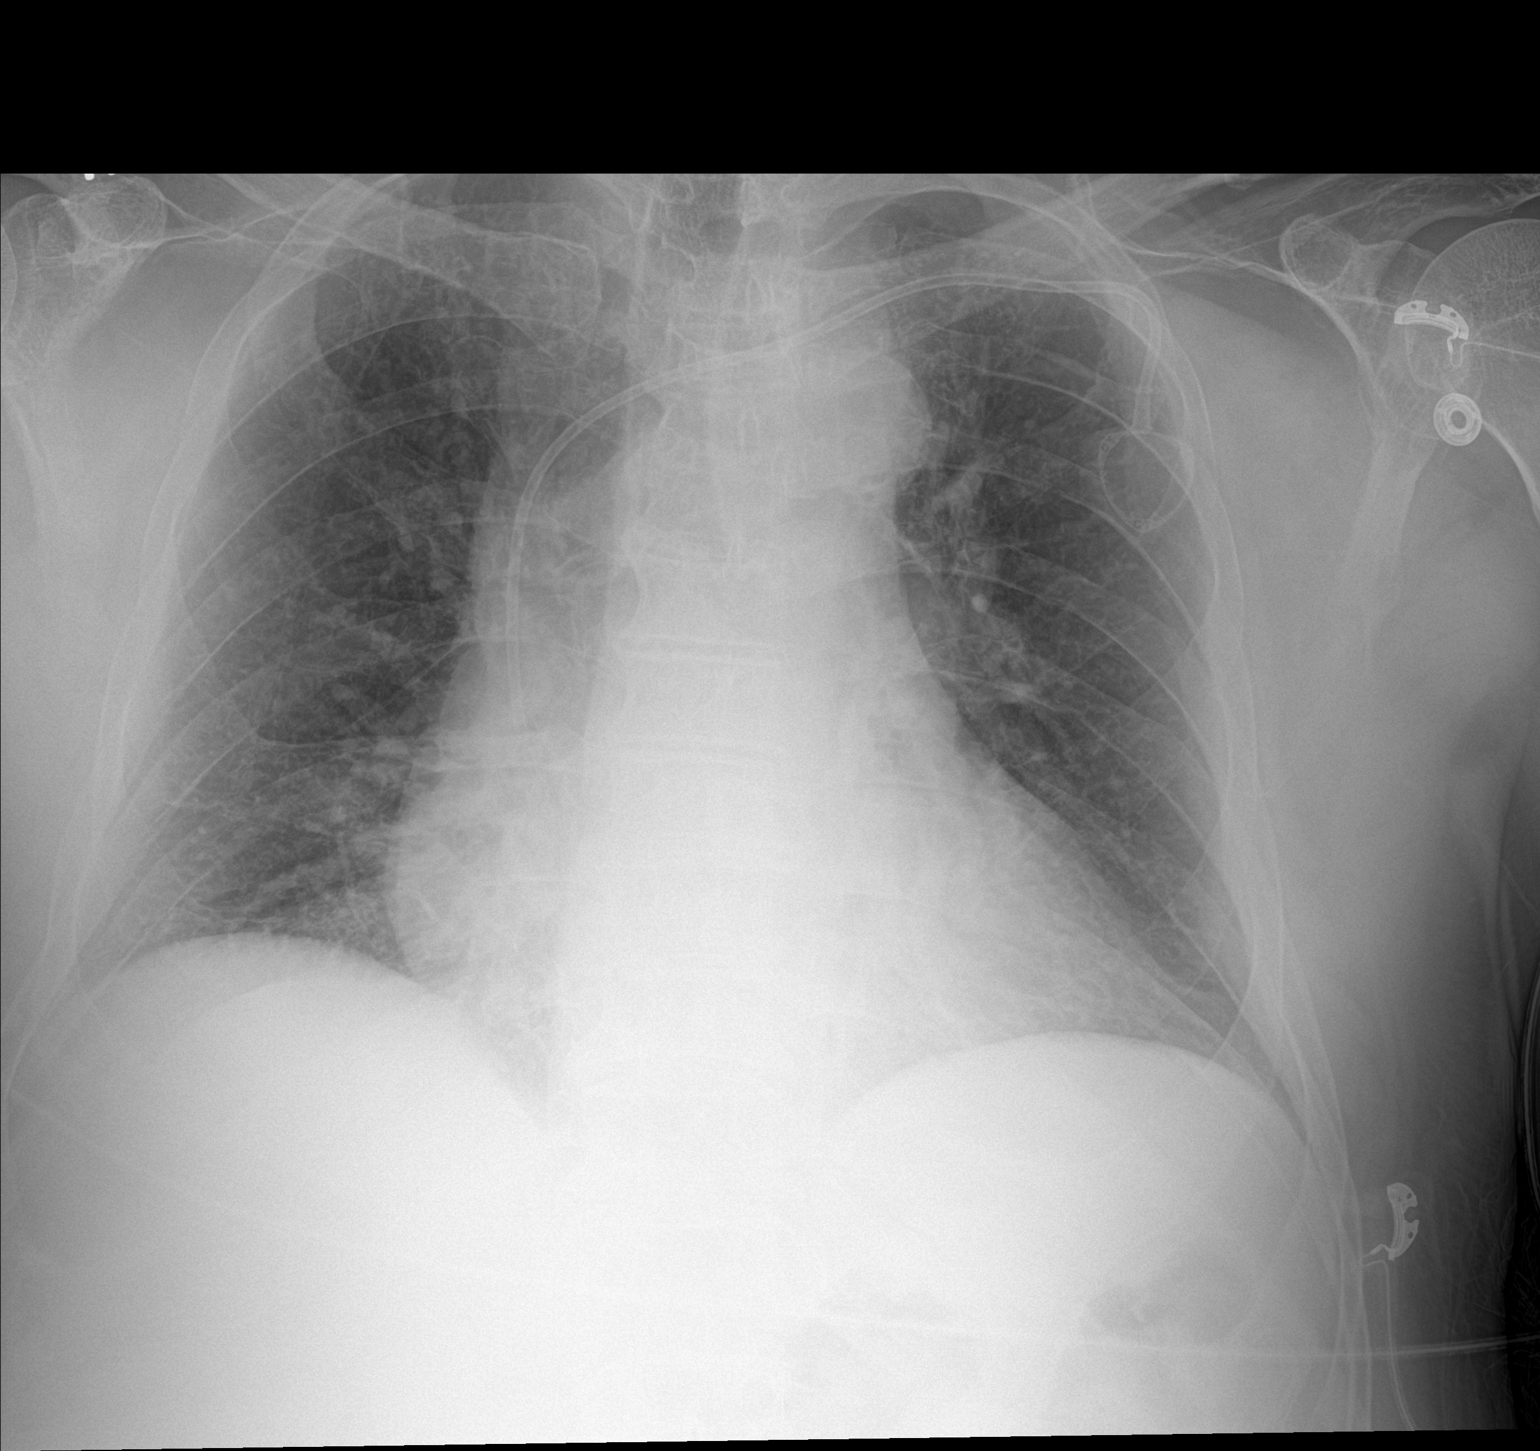

[1 of 1 positions shown; findings below may reference images not displayed]

FINDINGS: There is cardiomegaly. There is aortic atherosclerosis. The lungs
are clear. The vascularity is normal. No pneumothorax or hemothorax.
Power port on the left inserted by the subclavian vein has its tip
in the SVC immediately above the right atrium.
IMPRESSION: Left power port catheter tip in the SVC just proximal to the right
atrium. No complication.

## 2019-01-05 ENCOUNTER — Telehealth: Payer: Self-pay | Admitting: Family Medicine

## 2019-01-05 NOTE — Telephone Encounter (Signed)
LVM for pt to CB to reschedule appt on 7/22 with Dr. Carlota Raspberry. (Provider not in office).

## 2019-01-07 ENCOUNTER — Inpatient Hospital Stay: Payer: Medicare Other

## 2019-01-07 ENCOUNTER — Inpatient Hospital Stay: Payer: Medicare Other | Attending: Hematology

## 2019-01-07 ENCOUNTER — Ambulatory Visit (HOSPITAL_COMMUNITY): Admission: RE | Admit: 2019-01-07 | Payer: Medicare Other | Source: Ambulatory Visit

## 2019-01-07 ENCOUNTER — Other Ambulatory Visit: Payer: Self-pay

## 2019-01-07 DIAGNOSIS — Z85038 Personal history of other malignant neoplasm of large intestine: Secondary | ICD-10-CM | POA: Insufficient documentation

## 2019-01-07 DIAGNOSIS — Z452 Encounter for adjustment and management of vascular access device: Secondary | ICD-10-CM | POA: Diagnosis not present

## 2019-01-07 DIAGNOSIS — D509 Iron deficiency anemia, unspecified: Secondary | ICD-10-CM | POA: Diagnosis not present

## 2019-01-07 DIAGNOSIS — C180 Malignant neoplasm of cecum: Secondary | ICD-10-CM

## 2019-01-07 DIAGNOSIS — E119 Type 2 diabetes mellitus without complications: Secondary | ICD-10-CM | POA: Diagnosis not present

## 2019-01-07 DIAGNOSIS — Z95828 Presence of other vascular implants and grafts: Secondary | ICD-10-CM

## 2019-01-07 LAB — CBC WITH DIFFERENTIAL (CANCER CENTER ONLY)
Abs Immature Granulocytes: 0.02 10*3/uL (ref 0.00–0.07)
Basophils Absolute: 0.1 10*3/uL (ref 0.0–0.1)
Basophils Relative: 1 %
Eosinophils Absolute: 0.4 10*3/uL (ref 0.0–0.5)
Eosinophils Relative: 7 %
HCT: 37.3 % — ABNORMAL LOW (ref 39.0–52.0)
Hemoglobin: 12.5 g/dL — ABNORMAL LOW (ref 13.0–17.0)
Immature Granulocytes: 0 %
Lymphocytes Relative: 21 %
Lymphs Abs: 1.2 10*3/uL (ref 0.7–4.0)
MCH: 31.2 pg (ref 26.0–34.0)
MCHC: 33.5 g/dL (ref 30.0–36.0)
MCV: 93 fL (ref 80.0–100.0)
Monocytes Absolute: 0.6 10*3/uL (ref 0.1–1.0)
Monocytes Relative: 10 %
Neutro Abs: 3.4 10*3/uL (ref 1.7–7.7)
Neutrophils Relative %: 61 %
Platelet Count: 197 10*3/uL (ref 150–400)
RBC: 4.01 MIL/uL — ABNORMAL LOW (ref 4.22–5.81)
RDW: 13 % (ref 11.5–15.5)
WBC Count: 5.7 10*3/uL (ref 4.0–10.5)
nRBC: 0 % (ref 0.0–0.2)

## 2019-01-07 LAB — CMP (CANCER CENTER ONLY)
ALT: 15 U/L (ref 0–44)
AST: 18 U/L (ref 15–41)
Albumin: 3.7 g/dL (ref 3.5–5.0)
Alkaline Phosphatase: 129 U/L — ABNORMAL HIGH (ref 38–126)
Anion gap: 7 (ref 5–15)
BUN: 25 mg/dL — ABNORMAL HIGH (ref 8–23)
CO2: 26 mmol/L (ref 22–32)
Calcium: 8.7 mg/dL — ABNORMAL LOW (ref 8.9–10.3)
Chloride: 108 mmol/L (ref 98–111)
Creatinine: 0.79 mg/dL (ref 0.61–1.24)
GFR, Est AFR Am: >60 mL/min (ref >60)
GFR, Estimated: >60 mL/min (ref >60)
Glucose, Bld: 211 mg/dL — ABNORMAL HIGH (ref 70–99)
Potassium: 4.2 mmol/L (ref 3.5–5.1)
Sodium: 141 mmol/L (ref 135–145)
Total Bilirubin: 2.1 mg/dL — ABNORMAL HIGH (ref 0.3–1.2)
Total Protein: 6.5 g/dL (ref 6.5–8.1)

## 2019-01-07 LAB — CEA (IN HOUSE-CHCC): CEA (CHCC-In House): 1.24 ng/mL (ref 0.00–5.00)

## 2019-01-07 MED ORDER — SODIUM CHLORIDE 0.9% FLUSH
10.0000 mL | INTRAVENOUS | Status: DC | PRN
  Administered 2019-01-07: 10 mL
  Filled 2019-01-07: qty 10

## 2019-01-07 MED ORDER — HEPARIN SOD (PORK) LOCK FLUSH 100 UNIT/ML IV SOLN
500.0000 [IU] | Freq: Once | INTRAVENOUS | Status: AC | PRN
  Administered 2019-01-07: 500 [IU]
  Filled 2019-01-07: qty 5

## 2019-01-07 NOTE — Addendum Note (Signed)
Addended by: Will Bonnet on: 01/07/2019 02:35 PM   Modules accepted: Orders

## 2019-01-07 NOTE — Progress Notes (Signed)
Patient had MRI today and stated that his port was used at MRI and that they told him to come here and get deaccessed. I deaccessed patient now

## 2019-01-10 ENCOUNTER — Other Ambulatory Visit: Payer: Self-pay

## 2019-01-10 ENCOUNTER — Telehealth: Payer: Self-pay | Admitting: Hematology

## 2019-01-10 ENCOUNTER — Other Ambulatory Visit: Payer: Medicare Other

## 2019-01-10 ENCOUNTER — Inpatient Hospital Stay: Payer: Medicare Other | Admitting: Hematology
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
## 2019-01-10 NOTE — Telephone Encounter (Signed)
R/s appt per 6/7 sch message - unable to reach pt . Left message with new appt date and time

## 2019-01-10 NOTE — Patient Outreach (Signed)
[name] Care Management  01/10/2019  William Oneill 03-13-1946 [id number]   Medication Adherence call to William Oneill HIPPA Compliant Voice message left with a call back number. William Oneill is showing past due on Atorvastatin 10 mg and Januvia 100 mg under [place].   [facility] Management Direct Dial [phone]  Fax [phone] [email or web address]

## 2019-01-12 ENCOUNTER — Other Ambulatory Visit: Payer: Self-pay

## 2019-01-12 ENCOUNTER — Ambulatory Visit (HOSPITAL_COMMUNITY)
Admission: RE | Admit: 2019-01-12 | Discharge: 2019-01-12 | Disposition: A | Discharge status: Home / Self Care | Payer: Medicare Other | Source: Ambulatory Visit | Attending: Hematology | Admitting: Hematology

## 2019-01-12 DIAGNOSIS — C180 Malignant neoplasm of cecum: Secondary | ICD-10-CM | POA: Diagnosis not present

## 2019-01-12 MED ORDER — GADOBUTROL 1 MMOL/ML IV SOLN
7.0000 mL | Freq: Once | INTRAVENOUS | Status: AC | PRN
  Administered 2019-01-12: 7 mL via INTRAVENOUS

## 2019-01-13 ENCOUNTER — Encounter: Payer: Self-pay | Admitting: Hematology

## 2019-01-13 ENCOUNTER — Other Ambulatory Visit: Payer: Self-pay

## 2019-01-13 ENCOUNTER — Inpatient Hospital Stay (HOSPITAL_BASED_OUTPATIENT_CLINIC_OR_DEPARTMENT_OTHER): Payer: Medicare Other | Admitting: Hematology

## 2019-01-13 VITALS — BP 143/61 | HR 58 | Temp 99.4°F | Resp 17 | Ht 66.0 in | Wt 157.7 lb

## 2019-01-13 DIAGNOSIS — E119 Type 2 diabetes mellitus without complications: Secondary | ICD-10-CM | POA: Diagnosis not present

## 2019-01-13 DIAGNOSIS — D509 Iron deficiency anemia, unspecified: Secondary | ICD-10-CM

## 2019-01-13 DIAGNOSIS — Z85038 Personal history of other malignant neoplasm of large intestine: Secondary | ICD-10-CM | POA: Diagnosis not present

## 2019-01-13 DIAGNOSIS — C180 Malignant neoplasm of cecum: Secondary | ICD-10-CM

## 2019-01-14 ENCOUNTER — Telehealth: Payer: Self-pay | Admitting: Hematology

## 2019-01-14 NOTE — Telephone Encounter (Signed)
Scheduled appt per 6/11 los. A calendar will be mailed out. °
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
## 2019-02-23 ENCOUNTER — Ambulatory Visit: Payer: Medicare Other | Admitting: Family Medicine

## 2019-02-24 ENCOUNTER — Other Ambulatory Visit: Payer: Self-pay

## 2019-02-24 ENCOUNTER — Inpatient Hospital Stay: Payer: Medicare Other | Attending: Hematology

## 2019-02-24 ENCOUNTER — Inpatient Hospital Stay: Payer: Medicare Other

## 2019-02-24 DIAGNOSIS — C180 Malignant neoplasm of cecum: Secondary | ICD-10-CM

## 2019-02-24 DIAGNOSIS — Z452 Encounter for adjustment and management of vascular access device: Secondary | ICD-10-CM | POA: Insufficient documentation

## 2019-02-24 DIAGNOSIS — Z85038 Personal history of other malignant neoplasm of large intestine: Secondary | ICD-10-CM | POA: Diagnosis present

## 2019-02-24 DIAGNOSIS — Z95828 Presence of other vascular implants and grafts: Secondary | ICD-10-CM

## 2019-02-24 LAB — CBC WITH DIFFERENTIAL (CANCER CENTER ONLY)
Abs Immature Granulocytes: 0.02 10*3/uL (ref 0.00–0.07)
Basophils Absolute: 0.1 10*3/uL (ref 0.0–0.1)
Basophils Relative: 1 %
Eosinophils Absolute: 0.3 10*3/uL (ref 0.0–0.5)
Eosinophils Relative: 6 %
HCT: 37.9 % — ABNORMAL LOW (ref 39.0–52.0)
Hemoglobin: 13.2 g/dL (ref 13.0–17.0)
Immature Granulocytes: 0 %
Lymphocytes Relative: 25 %
Lymphs Abs: 1.5 10*3/uL (ref 0.7–4.0)
MCH: 31.9 pg (ref 26.0–34.0)
MCHC: 34.8 g/dL (ref 30.0–36.0)
MCV: 91.5 fL (ref 80.0–100.0)
Monocytes Absolute: 0.6 10*3/uL (ref 0.1–1.0)
Monocytes Relative: 10 %
Neutro Abs: 3.6 10*3/uL (ref 1.7–7.7)
Neutrophils Relative %: 58 %
Platelet Count: 178 10*3/uL (ref 150–400)
RBC: 4.14 MIL/uL — ABNORMAL LOW (ref 4.22–5.81)
RDW: 12.4 % (ref 11.5–15.5)
WBC Count: 6.2 10*3/uL (ref 4.0–10.5)
nRBC: 0 % (ref 0.0–0.2)

## 2019-02-24 LAB — CMP (CANCER CENTER ONLY)
ALT: 15 U/L (ref 0–44)
AST: 16 U/L (ref 15–41)
Albumin: 3.8 g/dL (ref 3.5–5.0)
Alkaline Phosphatase: 125 U/L (ref 38–126)
Anion gap: 6 (ref 5–15)
BUN: 24 mg/dL — ABNORMAL HIGH (ref 8–23)
CO2: 26 mmol/L (ref 22–32)
Calcium: 9.1 mg/dL (ref 8.9–10.3)
Chloride: 106 mmol/L (ref 98–111)
Creatinine: 0.91 mg/dL (ref 0.61–1.24)
GFR, Est AFR Am: >60 mL/min (ref >60)
GFR, Estimated: >60 mL/min (ref >60)
Glucose, Bld: 261 mg/dL — ABNORMAL HIGH (ref 70–99)
Potassium: 4.3 mmol/L (ref 3.5–5.1)
Sodium: 138 mmol/L (ref 135–145)
Total Bilirubin: 1.7 mg/dL — ABNORMAL HIGH (ref 0.3–1.2)
Total Protein: 6.7 g/dL (ref 6.5–8.1)

## 2019-02-24 LAB — CEA (IN HOUSE-CHCC): CEA (CHCC-In House): 1.17 ng/mL (ref 0.00–5.00)

## 2019-02-24 MED ORDER — SODIUM CHLORIDE 0.9% FLUSH
10.0000 mL | INTRAVENOUS | Status: DC | PRN
  Administered 2019-02-24: 10 mL
  Filled 2019-02-24: qty 10

## 2019-02-24 MED ORDER — HEPARIN SOD (PORK) LOCK FLUSH 100 UNIT/ML IV SOLN
500.0000 [IU] | Freq: Once | INTRAVENOUS | Status: AC | PRN
  Administered 2019-02-24: 11:00:00 500 [IU]
  Filled 2019-02-24: qty 5

## 2019-02-25 ENCOUNTER — Telehealth: Payer: Self-pay

## 2019-02-25 NOTE — Telephone Encounter (Signed)
TC from [name] stating that he received a call about lab results, informed him that  the call was in reference to his CEA results, Per Cira Rue NP his CEA results are normal. Pt. Verbalized understanding, wanted to know next appointment time, Informed him his appointment was on 04/15/19 at 915am.

## 2019-02-25 NOTE — Telephone Encounter (Signed)
-----   Message from [name], NP sent at 02/24/2019  4:44 PM EDT ----- Please let him know CEA is normal. Thanks, lacie

## 2019-02-25 NOTE — Telephone Encounter (Signed)
Left VM msg for pt to c/b in regards to note below Per Cira Rue NP:  Please let him know CEA is normal.  Thanks,  lacie

## 2019-03-02 ENCOUNTER — Ambulatory Visit: Payer: Medicare Other | Admitting: Family Medicine

## 2019-03-09 ENCOUNTER — Encounter: Payer: Self-pay | Admitting: Family Medicine

## 2019-03-09 ENCOUNTER — Other Ambulatory Visit: Payer: Self-pay

## 2019-03-09 ENCOUNTER — Ambulatory Visit (INDEPENDENT_AMBULATORY_CARE_PROVIDER_SITE_OTHER): Payer: Medicare Other | Admitting: Family Medicine

## 2019-03-09 VITALS — BP 114/62 | HR 67 | Temp 98.4°F | Resp 12 | Wt 163.2 lb

## 2019-03-09 DIAGNOSIS — E785 Hyperlipidemia, unspecified: Secondary | ICD-10-CM

## 2019-03-09 DIAGNOSIS — E119 Type 2 diabetes mellitus without complications: Secondary | ICD-10-CM | POA: Diagnosis not present

## 2019-03-09 DIAGNOSIS — G4700 Insomnia, unspecified: Secondary | ICD-10-CM | POA: Diagnosis not present

## 2019-03-09 DIAGNOSIS — IMO0001 Reserved for inherently not codable concepts without codable children: Secondary | ICD-10-CM

## 2019-03-09 DIAGNOSIS — E1165 Type 2 diabetes mellitus with hyperglycemia: Secondary | ICD-10-CM | POA: Diagnosis not present

## 2019-03-09 LAB — GLUCOSE, POCT (MANUAL RESULT ENTRY): POC Glucose: 268 mg/dl — AB (ref 70–99)

## 2019-03-09 MED ORDER — ATORVASTATIN CALCIUM 10 MG PO TABS: 10.0000 mg | ORAL_TABLET | Freq: Every day | ORAL | 2 refills | Status: DC

## 2019-03-09 MED ORDER — TRAZODONE HCL 100 MG PO TABS: 50.0000 mg | ORAL_TABLET | Freq: Every day | ORAL | 1 refills | Status: DC

## 2019-03-09 MED ORDER — METFORMIN HCL 1000 MG PO TABS: 1000.0000 mg | ORAL_TABLET | Freq: Two times a day (BID) | ORAL | 1 refills | Status: DC

## 2019-03-09 MED ORDER — LANTUS SOLOSTAR 100 UNIT/ML ~~LOC~~ SOPN: 5.0000 [IU] | PEN_INJECTOR | Freq: Every day | SUBCUTANEOUS | 3 refills | Status: DC

## 2019-03-09 MED ORDER — SITAGLIPTIN PHOSPHATE 100 MG PO TABS: 100.0000 mg | ORAL_TABLET | Freq: Every day | ORAL | 1 refills | Status: DC

## 2019-03-09 NOTE — Patient Instructions (Addendum)
Blood sugar is elevated in the office here today.  You may need to be on a higher dose of insulin but I will look at the hemoglobin A1c.  Continue metformin, Januvia same doses, and if possible check your blood sugars at home and let me know those readings in the next week or 2.  Recheck in 3 months, possibly sooner depending on your A1c reading.  Thank you for coming in today, and let me know if there are questions.   If you have lab work done today you will be contacted with your lab results within the next 2 weeks.  If you have not heard from [place] then please contact us. The fastest way to get your results is to register for My Chart.   IF you received an x-ray today, you will receive an invoice from [facility]. Please contact [facility] at [phone] with questions or concerns regarding your invoice.   IF you received labwork today, you will receive an invoice from [facility]. Please contact [facility] at [phone] with questions or concerns regarding your invoice.   Our billing staff will not be able to assist you with questions regarding bills from these companies.  You will be contacted with the lab results as soon as they are available. The fastest way to get your results is to activate your My Chart account. Instructions are located on the last page of this paperwork. If you have not heard from [place] regarding the results in 2 weeks, please contact this office.
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
## 2019-03-13 ENCOUNTER — Encounter: Payer: Self-pay | Admitting: Family Medicine

## 2019-04-05 ENCOUNTER — Telehealth: Payer: Self-pay | Admitting: Hematology

## 2019-04-05 NOTE — Telephone Encounter (Signed)
Called pt per 8/28 sch message - no answer - unable to reach pt to reschedule appt - left as is . Message sent to Dr. Burr Medico .
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
## 2019-04-13 ENCOUNTER — Inpatient Hospital Stay (HOSPITAL_BASED_OUTPATIENT_CLINIC_OR_DEPARTMENT_OTHER): Payer: Medicare Other | Admitting: Hematology

## 2019-04-13 ENCOUNTER — Telehealth: Payer: Self-pay | Admitting: Hematology

## 2019-04-13 ENCOUNTER — Inpatient Hospital Stay: Payer: Medicare Other

## 2019-04-13 ENCOUNTER — Other Ambulatory Visit: Payer: Self-pay

## 2019-04-13 ENCOUNTER — Inpatient Hospital Stay: Payer: Medicare Other | Attending: Hematology

## 2019-04-13 ENCOUNTER — Encounter: Payer: Self-pay | Admitting: Hematology

## 2019-04-13 VITALS — BP 149/72 | HR 59 | Temp 98.7°F | Resp 17 | Ht 66.0 in | Wt 169.8 lb

## 2019-04-13 DIAGNOSIS — Z9221 Personal history of antineoplastic chemotherapy: Secondary | ICD-10-CM | POA: Insufficient documentation

## 2019-04-13 DIAGNOSIS — D649 Anemia, unspecified: Secondary | ICD-10-CM | POA: Insufficient documentation

## 2019-04-13 DIAGNOSIS — E119 Type 2 diabetes mellitus without complications: Secondary | ICD-10-CM | POA: Insufficient documentation

## 2019-04-13 DIAGNOSIS — I700 Atherosclerosis of aorta: Secondary | ICD-10-CM | POA: Insufficient documentation

## 2019-04-13 DIAGNOSIS — Z794 Long term (current) use of insulin: Secondary | ICD-10-CM | POA: Diagnosis not present

## 2019-04-13 DIAGNOSIS — E7800 Pure hypercholesterolemia, unspecified: Secondary | ICD-10-CM | POA: Insufficient documentation

## 2019-04-13 DIAGNOSIS — Z79899 Other long term (current) drug therapy: Secondary | ICD-10-CM | POA: Insufficient documentation

## 2019-04-13 DIAGNOSIS — I2510 Atherosclerotic heart disease of native coronary artery without angina pectoris: Secondary | ICD-10-CM | POA: Diagnosis not present

## 2019-04-13 DIAGNOSIS — I4891 Unspecified atrial fibrillation: Secondary | ICD-10-CM | POA: Insufficient documentation

## 2019-04-13 DIAGNOSIS — K8020 Calculus of gallbladder without cholecystitis without obstruction: Secondary | ICD-10-CM | POA: Insufficient documentation

## 2019-04-13 DIAGNOSIS — Z85038 Personal history of other malignant neoplasm of large intestine: Secondary | ICD-10-CM | POA: Diagnosis not present

## 2019-04-13 DIAGNOSIS — C180 Malignant neoplasm of cecum: Secondary | ICD-10-CM

## 2019-04-13 DIAGNOSIS — Z791 Long term (current) use of non-steroidal anti-inflammatories (NSAID): Secondary | ICD-10-CM | POA: Insufficient documentation

## 2019-04-13 DIAGNOSIS — Z95828 Presence of other vascular implants and grafts: Secondary | ICD-10-CM

## 2019-04-13 DIAGNOSIS — Z9049 Acquired absence of other specified parts of digestive tract: Secondary | ICD-10-CM | POA: Insufficient documentation

## 2019-04-13 LAB — CBC WITH DIFFERENTIAL (CANCER CENTER ONLY)
Abs Immature Granulocytes: 0.03 10*3/uL (ref 0.00–0.07)
Basophils Absolute: 0.1 10*3/uL (ref 0.0–0.1)
Basophils Relative: 1 %
Eosinophils Absolute: 0.4 10*3/uL (ref 0.0–0.5)
Eosinophils Relative: 5 %
HCT: 39.8 % (ref 39.0–52.0)
Hemoglobin: 13.9 g/dL (ref 13.0–17.0)
Immature Granulocytes: 0 %
Lymphocytes Relative: 27 %
Lymphs Abs: 2 10*3/uL (ref 0.7–4.0)
MCH: 31.4 pg (ref 26.0–34.0)
MCHC: 34.9 g/dL (ref 30.0–36.0)
MCV: 90 fL (ref 80.0–100.0)
Monocytes Absolute: 0.7 10*3/uL (ref 0.1–1.0)
Monocytes Relative: 10 %
Neutro Abs: 4 10*3/uL (ref 1.7–7.7)
Neutrophils Relative %: 57 %
Platelet Count: 191 10*3/uL (ref 150–400)
RBC: 4.42 MIL/uL (ref 4.22–5.81)
RDW: 12.1 % (ref 11.5–15.5)
WBC Count: 7.2 10*3/uL (ref 4.0–10.5)
nRBC: 0 % (ref 0.0–0.2)

## 2019-04-13 LAB — CMP (CANCER CENTER ONLY)
ALT: 17 U/L (ref 0–44)
AST: 16 U/L (ref 15–41)
Albumin: 3.9 g/dL (ref 3.5–5.0)
Alkaline Phosphatase: 111 U/L (ref 38–126)
Anion gap: 8 (ref 5–15)
BUN: 19 mg/dL (ref 8–23)
CO2: 26 mmol/L (ref 22–32)
Calcium: 8.8 mg/dL — ABNORMAL LOW (ref 8.9–10.3)
Chloride: 105 mmol/L (ref 98–111)
Creatinine: 0.92 mg/dL (ref 0.61–1.24)
GFR, Est AFR Am: >60 mL/min (ref >60)
GFR, Estimated: >60 mL/min (ref >60)
Glucose, Bld: 224 mg/dL — ABNORMAL HIGH (ref 70–99)
Potassium: 4 mmol/L (ref 3.5–5.1)
Sodium: 139 mmol/L (ref 135–145)
Total Bilirubin: 1.4 mg/dL — ABNORMAL HIGH (ref 0.3–1.2)
Total Protein: 6.6 g/dL (ref 6.5–8.1)

## 2019-04-13 MED ORDER — HEPARIN SOD (PORK) LOCK FLUSH 100 UNIT/ML IV SOLN
500.0000 [IU] | Freq: Once | INTRAVENOUS | Status: AC | PRN
  Administered 2019-04-13: 15:00:00 500 [IU]
  Filled 2019-04-13: qty 5

## 2019-04-13 MED ORDER — SODIUM CHLORIDE 0.9% FLUSH
10.0000 mL | INTRAVENOUS | Status: DC | PRN
  Administered 2019-04-13: 15:00:00 10 mL
  Filled 2019-04-13: qty 10

## 2019-04-13 NOTE — Telephone Encounter (Signed)
Scheduled appt per 9/9 los - gave pt AVS and calender per los.

## 2019-04-14 LAB — CEA (IN HOUSE-CHCC): CEA (CHCC-In House): 1.04 ng/mL (ref 0.00–5.00)

## 2019-04-15 ENCOUNTER — Other Ambulatory Visit: Payer: Medicare Other

## 2019-04-15 ENCOUNTER — Ambulatory Visit: Payer: Medicare Other | Admitting: Hematology

## 2019-04-15 ENCOUNTER — Telehealth: Payer: Self-pay

## 2019-04-15 NOTE — Telephone Encounter (Signed)
TC to pt per Lacie to let him know that the CEA tumor marker is normal. Pt verbalized understanding

## 2019-05-26 LAB — HM COLONOSCOPY

## 2019-06-02 ENCOUNTER — Telehealth: Payer: Self-pay | Admitting: *Deleted

## 2019-06-02 NOTE — Telephone Encounter (Signed)
Schedule AWV.  

## 2019-06-03 ENCOUNTER — Encounter: Payer: Self-pay | Admitting: Family Medicine

## 2019-06-08 ENCOUNTER — Other Ambulatory Visit: Payer: Self-pay

## 2019-06-08 ENCOUNTER — Ambulatory Visit (INDEPENDENT_AMBULATORY_CARE_PROVIDER_SITE_OTHER): Payer: Medicare Other | Admitting: Family Medicine

## 2019-06-08 ENCOUNTER — Encounter: Payer: Self-pay | Admitting: Family Medicine

## 2019-06-08 VITALS — BP 118/68 | HR 93 | Temp 98.6°F | Resp 16 | Ht 65.0 in | Wt 170.8 lb

## 2019-06-08 VITALS — BP 118/68 | HR 63 | Temp 98.6°F | Resp 16 | Ht 65.0 in | Wt 169.0 lb

## 2019-06-08 DIAGNOSIS — E119 Type 2 diabetes mellitus without complications: Secondary | ICD-10-CM | POA: Diagnosis not present

## 2019-06-08 DIAGNOSIS — G4700 Insomnia, unspecified: Secondary | ICD-10-CM

## 2019-06-08 DIAGNOSIS — Z23 Encounter for immunization: Secondary | ICD-10-CM | POA: Diagnosis not present

## 2019-06-08 DIAGNOSIS — E1165 Type 2 diabetes mellitus with hyperglycemia: Secondary | ICD-10-CM | POA: Diagnosis not present

## 2019-06-08 DIAGNOSIS — Z0000 Encounter for general adult medical examination without abnormal findings: Secondary | ICD-10-CM

## 2019-06-08 DIAGNOSIS — E785 Hyperlipidemia, unspecified: Secondary | ICD-10-CM | POA: Diagnosis not present

## 2019-06-08 DIAGNOSIS — Z794 Long term (current) use of insulin: Secondary | ICD-10-CM

## 2019-06-08 LAB — POCT GLYCOSYLATED HEMOGLOBIN (HGB A1C): Hemoglobin A1C: 8.4 % — AB (ref 4.0–5.6)

## 2019-06-08 LAB — GLUCOSE, POCT (MANUAL RESULT ENTRY): POC Glucose: 234 mg/dl — AB (ref 70–99)

## 2019-06-08 MED ORDER — METFORMIN HCL 1000 MG PO TABS: 1000.0000 mg | ORAL_TABLET | Freq: Two times a day (BID) | ORAL | 1 refills | Status: DC

## 2019-06-08 MED ORDER — LANTUS SOLOSTAR 100 UNIT/ML ~~LOC~~ SOPN: 7.0000 [IU] | PEN_INJECTOR | Freq: Every day | SUBCUTANEOUS | 3 refills | Status: DC

## 2019-06-08 MED ORDER — SITAGLIPTIN PHOSPHATE 100 MG PO TABS: 100.0000 mg | ORAL_TABLET | Freq: Every day | ORAL | 1 refills | Status: DC

## 2019-06-08 MED ORDER — TRAZODONE HCL 100 MG PO TABS: 50.0000 mg | ORAL_TABLET | Freq: Every day | ORAL | 1 refills | Status: DC

## 2019-06-08 MED ORDER — ATORVASTATIN CALCIUM 10 MG PO TABS: 10.0000 mg | ORAL_TABLET | Freq: Every day | ORAL | 2 refills | Status: DC
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
## 2019-06-08 NOTE — Patient Instructions (Addendum)
Increase lantus to 7 units for now, then in 1 week if you are still getting readings in 200's - increase to 9 units. Watch for low blood sugar symptoms and have sugar readily available if you drop low as we adjust your meds.  Continue Metformin, Januvia at same doses. Recheck in 3 months.   Trazodone 1/2 to 1 pill at night as needed for sleep.  If you are having more difficulty with sleep, please let me know.   Type 2 Diabetes Mellitus, Self Care, Adult When you have type 2 diabetes (type 2 diabetes mellitus), you must make sure your blood sugar (glucose) stays in a healthy range. You can do this with:  Nutrition.  Exercise.  Lifestyle changes.  Medicines or insulin, if needed.  Support from your doctors and others. How to stay aware of blood sugar   Check your blood sugar level every day, as often as told.  Have your A1c (hemoglobin A1c) level checked two or more times a year. Have it checked more often if your doctor tells you to. Your doctor will set personal treatment goals for you. Generally, you should have these blood sugar levels:  Before meals (preprandial): 80-130 mg/dL (4.4-7.2 mmol/L).  After meals (postprandial): below 180 mg/dL (10 mmol/L).  A1c level: less than 7%. How to manage high and low blood sugar Signs of high blood sugar High blood sugar is called hyperglycemia. Know the signs of high blood sugar. Signs may include:  Feeling: ? Thirsty. ? Hungry. ? Very tired.  Needing to pee (urinate) more than usual.  Blurry vision. Signs of low blood sugar Low blood sugar is called hypoglycemia. This is when blood sugar is at or below 70 mg/dL (3.9 mmol/L). Signs may include:  Feeling: ? Hungry. ? Worried or nervous (anxious). ? Sweaty and clammy. ? Confused. ? Dizzy. ? Sleepy. ? Sick to your stomach (nauseous).  Having: ? A fast heartbeat. ? A headache. ? A change in your vision. ? Jerky movements that you cannot control (seizure). ? Tingling or  no feeling (numbness) around your mouth, lips, or tongue.  Having trouble with: ? Moving (coordination). ? Sleeping. ? Passing out (fainting). ? Getting upset easily (irritability). Treating low blood sugar To treat low blood sugar, eat or drink something sugary right away. If you can think clearly and swallow safely, follow the 15:15 rule:  Take 15 grams of a fast-acting carb (carbohydrate). Talk with your doctor about how much you should take.  Some fast-acting carbs are: ? Sugar tablets (glucose pills). Take 3-4 pills. ? 6-8 pieces of hard candy. ? 4-6 oz (120-150 mL) of fruit juice. ? 4-6 oz (120-150 mL) of regular (not diet) soda. ? 1 Tbsp (15 mL) honey or sugar.  Check your blood sugar 15 minutes after you take the carb.  If your blood sugar is still at or below 70 mg/dL (3.9 mmol/L), take 15 grams of a carb again.  If your blood sugar does not go above 70 mg/dL (3.9 mmol/L) after 3 tries, get help right away.  After your blood sugar goes back to normal, eat a meal or a snack within 1 hour. Treating very low blood sugar If your blood sugar is at or below 54 mg/dL (3 mmol/L), you have very low blood sugar (severe hypoglycemia). This is an emergency. Do not wait to see if the symptoms will go away. Get medical help right away. Call your local emergency services (911 in the [place] If you have very low  blood sugar and you cannot eat or drink, you may need a glucagon shot (injection). A family member or friend should learn how to check your blood sugar and how to give you a glucagon shot. Ask your doctor if you need to have a glucagon shot kit at home. Follow these instructions at home: Medicine  Take insulin and diabetes medicines as told.  If your doctor says you should take more or less insulin and medicines, do this exactly as told.  Do not run out of insulin or medicines. Having diabetes can raise your risk for other long-term conditions. These include heart disease and  kidney disease. Your doctor may prescribe medicines to help you not have these problems. Food   Make healthy food choices. These include: ? Chicken, fish, egg whites, and beans. ? Oats, whole wheat, bulgur, brown rice, quinoa, and millet. ? Fresh fruits and vegetables. ? Low-fat dairy products. ? Nuts, avocado, olive oil, and canola oil.  Meet with a food specialist (dietitian). He or she can help you make an eating plan that is right for you.  Follow instructions from your doctor about what you cannot eat or drink.  Drink enough fluid to keep your pee (urine) pale yellow.  Keep track of carbs that you eat. Do this by reading food labels and learning food serving sizes.  Follow your sick day plan when you cannot eat or drink normally. Make this plan with your doctor so it is ready to use. Activity  Exercise 3 or more times a week.  Do not go more than 2 days without exercising.  Talk with your doctor before you start a new exercise. Your doctor may need to tell you to change: ? How much insulin or medicines you take. ? How much food you eat. Lifestyle  Do not use any tobacco products. These include cigarettes, chewing tobacco, and e-cigarettes. If you need help quitting, ask your doctor.  Ask your doctor how much alcohol is safe for you.  Learn to deal with stress. If you need help with this, ask your doctor. Body care   Stay up to date with your shots (immunizations).  Have your eyes and feet checked by a doctor as often as told.  Check your skin and feet every day. Check for cuts, bruises, redness, blisters, or sores.  Brush your teeth and gums two times a day. Floss one or more times a day.  Go to the dentist one or more times every 6 months.  Stay at a healthy weight. General instructions  Take over-the-counter and prescription medicines only as told by your doctor.  Share your diabetes care plan with: ? Your work or school. ? People you live with.  Carry  a card or wear jewelry that says you have diabetes.  Keep all follow-up visits as told by your doctor. This is important. Questions to ask your doctor  Do I need to meet with a diabetes educator?  Where can I find a support group for people with diabetes? Where to find more information To learn more about diabetes, visit:  American Diabetes Association: www.diabetes.org  American Association of Diabetes Educators: www.diabeteseducator.org Summary  When you have type 2 diabetes, you must make sure your blood sugar (glucose) stays in a healthy range.  Check your blood sugar every day, as often as told.  Having diabetes can raise your risk for other conditions. Your doctor may prescribe medicines to help you not have these problems.  Keep all follow-up visits  as told by your doctor. This is important. This information is not intended to replace advice given to you by your health care provider. Make sure you discuss any questions you have with your health care provider. Document Released: 11/12/2015 Document Revised: 01/11/2018 Document Reviewed: 08/24/2015 Elsevier Patient Education  [employer].  If you have lab work done today you will be contacted with your lab results within the next 2 weeks.  If you have not heard from [place] then please contact us. The fastest way to get your results is to register for My Chart.   IF you received an x-ray today, you will receive an invoice from [facility]. Please contact [facility] Radiology at [phone] with questions or concerns regarding your invoice.   IF you received labwork today, you will receive an invoice from [place]. Please contact [facility] at [phone] with questions or concerns regarding your invoice.   Our billing staff will not be able to assist you with questions regarding bills from these companies.  You will be contacted with the lab results as soon as they are available. The fastest way to get your results  is to activate your My Chart account. Instructions are located on the last page of this paperwork. If you have not heard from [place] regarding the results in 2 weeks, please contact this office.

## 2019-06-08 NOTE — Addendum Note (Signed)
Addended by: Norton Blizzard R on: [id number] 03:54 PM   Modules accepted: Orders

## 2019-06-08 NOTE — Patient Instructions (Signed)
Thank you for taking time to come for your Medicare Wellness Visit. I appreciate your ongoing commitment to your health goals. Please review the following plan we discussed and let me know if I can assist you in the future.  William Kennedy LPN  Preventive Care 64 Years and Older, Male Preventive care refers to lifestyle choices and visits with your health care provider that can promote health and wellness. This includes:  A yearly physical exam. This is also called an annual well check.  Regular dental and eye exams.  Immunizations.  Screening for certain conditions.  Healthy lifestyle choices, such as diet and exercise. What can I expect for my preventive care visit? Physical exam Your health care provider will check:  Height and weight. These may be used to calculate body mass index (BMI), which is a measurement that tells if you are at a healthy weight.  Heart rate and blood pressure.  Your skin for abnormal spots. Counseling Your health care provider may ask you questions about:  Alcohol, tobacco, and drug use.  Emotional well-being.  Home and relationship well-being.  Sexual activity.  Eating habits.  History of falls.  Memory and ability to understand (cognition).  Work and work Statistician. What immunizations do I need?  Influenza (flu) vaccine  This is recommended every year. Tetanus, diphtheria, and pertussis (Tdap) vaccine  You may need a Td booster every 10 years. Varicella (chickenpox) vaccine  You may need this vaccine if you have not already been vaccinated. Zoster (shingles) vaccine  You may need this after age 66. Pneumococcal conjugate (PCV13) vaccine  One dose is recommended after age 84. Pneumococcal polysaccharide (PPSV23) vaccine  One dose is recommended after age 88. Measles, mumps, and rubella (MMR) vaccine  You may need at least one dose of MMR if you were born in 1957 or later. You may also need a second dose. Meningococcal  conjugate (MenACWY) vaccine  You may need this if you have certain conditions. Hepatitis A vaccine  You may need this if you have certain conditions or if you travel or work in places where you may be exposed to hepatitis A. Hepatitis B vaccine  You may need this if you have certain conditions or if you travel or work in places where you may be exposed to hepatitis B. Haemophilus influenzae type b (Hib) vaccine  You may need this if you have certain conditions. You may receive vaccines as individual doses or as more than one vaccine together in one shot (combination vaccines). Talk with your health care provider about the risks and benefits of combination vaccines. What tests do I need? Blood tests  Lipid and cholesterol levels. These may be checked every 5 years, or more frequently depending on your overall health.  Hepatitis C test.  Hepatitis B test. Screening  Lung cancer screening. You may have this screening every year starting at age 24 if you have a 30-pack-year history of smoking and currently smoke or have quit within the past 15 years.  Colorectal cancer screening. All adults should have this screening starting at age 52 and continuing until age 61. Your health care provider may recommend screening at age 57 if you are at increased risk. You will have tests every 1-10 years, depending on your results and the type of screening test.  Prostate cancer screening. Recommendations will vary depending on your family history and other risks.  Diabetes screening. This is done by checking your blood sugar (glucose) after you have not eaten for  a while (fasting). You may have this done every 1-3 years.  Abdominal aortic aneurysm (AAA) screening. You may need this if you are a current or former smoker.  Sexually transmitted disease (STD) testing. Follow these instructions at home: Eating and drinking  Eat a diet that includes fresh fruits and vegetables, whole grains, lean  protein, and low-fat dairy products. Limit your intake of foods with high amounts of sugar, saturated fats, and salt.  Take vitamin and mineral supplements as recommended by your health care provider.  Do not drink alcohol if your health care provider tells you not to drink.  If you drink alcohol: ? Limit how much you have to 0-2 drinks a day. ? Be aware of how much alcohol is in your drink. In the [place] one drink equals one 12 oz bottle of beer (355 mL), one 5 oz glass of wine (148 mL), or one 1 oz glass of hard liquor (44 mL). Lifestyle  Take daily care of your teeth and gums.  Stay active. Exercise for at least 30 minutes on 5 or more days each week.  Do not use any products that contain nicotine or tobacco, such as cigarettes, e-cigarettes, and chewing tobacco. If you need help quitting, ask your health care provider.  If you are sexually active, practice safe sex. Use a condom or other form of protection to prevent STIs (sexually transmitted infections).  Talk with your health care provider about taking a low-dose aspirin or statin. What's next?  Visit your health care provider once a year for a well check visit.  Ask your health care provider how often you should have your eyes and teeth checked.  Stay up to date on all vaccines. This information is not intended to replace advice given to you by your health care provider. Make sure you discuss any questions you have with your health care provider. Document Released: 08/17/2015 Document Revised: 07/15/2018 Document Reviewed: 07/15/2018 Elsevier Patient Education  2020 [employer].

## 2019-06-09 LAB — COMPREHENSIVE METABOLIC PANEL
ALT: 20 IU/L (ref 0–44)
AST: 16 IU/L (ref 0–40)
Albumin/Globulin Ratio: 1.8 (ref 1.2–2.2)
Albumin: 4.2 g/dL (ref 3.7–4.7)
Alkaline Phosphatase: 132 IU/L — ABNORMAL HIGH (ref 39–117)
BUN/Creatinine Ratio: 28 — ABNORMAL HIGH (ref 10–24)
BUN: 21 mg/dL (ref 8–27)
Bilirubin Total: 2.3 mg/dL — ABNORMAL HIGH (ref 0.0–1.2)
CO2: 24 mmol/L (ref 20–29)
Calcium: 9.3 mg/dL (ref 8.6–10.2)
Chloride: 99 mmol/L (ref 96–106)
Creatinine, Ser: 0.75 mg/dL — ABNORMAL LOW (ref 0.76–1.27)
GFR calc Af Amer: 106 mL/min/{1.73_m2} (ref >59)
GFR calc non Af Amer: 92 mL/min/{1.73_m2} (ref >59)
Globulin, Total: 2.4 g/dL (ref 1.5–4.5)
Glucose: 233 mg/dL — ABNORMAL HIGH (ref 65–99)
Potassium: 4.6 mmol/L (ref 3.5–5.2)
Sodium: 137 mmol/L (ref 134–144)
Total Protein: 6.6 g/dL (ref 6.0–8.5)

## 2019-06-09 LAB — LIPID PANEL
Chol/HDL Ratio: 3.8 ratio (ref 0.0–5.0)
Cholesterol, Total: 210 mg/dL — ABNORMAL HIGH (ref 100–199)
HDL: 55 mg/dL (ref >39)
LDL Chol Calc (NIH): 132 mg/dL — ABNORMAL HIGH (ref 0–99)
Triglycerides: 127 mg/dL (ref 0–149)
VLDL Cholesterol Cal: 23 mg/dL (ref 5–40)

## 2019-06-09 LAB — MICROALBUMIN / CREATININE URINE RATIO
Creatinine, Urine: 63.8 mg/dL
Microalb/Creat Ratio: 33 mg/g creat — ABNORMAL HIGH (ref 0–29)
Microalbumin, Urine: 21.3 ug/mL

## 2019-06-11 ENCOUNTER — Other Ambulatory Visit: Payer: Self-pay

## 2019-06-11 ENCOUNTER — Encounter (HOSPITAL_COMMUNITY): Payer: Self-pay | Admitting: Emergency Medicine

## 2019-06-11 ENCOUNTER — Emergency Department (HOSPITAL_COMMUNITY)
Admission: EM | Admit: 2019-06-11 | Discharge: 2019-06-11 | Disposition: A | Discharge status: Home / Self Care | Payer: Medicare Other | Attending: Emergency Medicine | Admitting: Emergency Medicine

## 2019-06-11 ENCOUNTER — Emergency Department (HOSPITAL_COMMUNITY): Payer: Medicare Other

## 2019-06-11 DIAGNOSIS — Z7984 Long term (current) use of oral hypoglycemic drugs: Secondary | ICD-10-CM | POA: Insufficient documentation

## 2019-06-11 DIAGNOSIS — Z79899 Other long term (current) drug therapy: Secondary | ICD-10-CM | POA: Diagnosis not present

## 2019-06-11 DIAGNOSIS — Z20822 Contact with and (suspected) exposure to covid-19: Secondary | ICD-10-CM

## 2019-06-11 DIAGNOSIS — E119 Type 2 diabetes mellitus without complications: Secondary | ICD-10-CM | POA: Diagnosis not present

## 2019-06-11 DIAGNOSIS — R05 Cough: Secondary | ICD-10-CM | POA: Diagnosis present

## 2019-06-11 DIAGNOSIS — I10 Essential (primary) hypertension: Secondary | ICD-10-CM | POA: Insufficient documentation

## 2019-06-11 DIAGNOSIS — Z20828 Contact with and (suspected) exposure to other viral communicable diseases: Secondary | ICD-10-CM

## 2019-06-11 DIAGNOSIS — J069 Acute upper respiratory infection, unspecified: Secondary | ICD-10-CM

## 2019-06-11 DIAGNOSIS — U071 COVID-19: Secondary | ICD-10-CM | POA: Diagnosis not present

## 2019-06-11 DIAGNOSIS — Z85038 Personal history of other malignant neoplasm of large intestine: Secondary | ICD-10-CM | POA: Insufficient documentation

## 2019-06-11 LAB — COMPREHENSIVE METABOLIC PANEL
ALT: 25 U/L (ref 0–44)
AST: 20 U/L (ref 15–41)
Albumin: 3.9 g/dL (ref 3.5–5.0)
Alkaline Phosphatase: 107 U/L (ref 38–126)
Anion gap: 7 (ref 5–15)
BUN: 24 mg/dL — ABNORMAL HIGH (ref 8–23)
CO2: 27 mmol/L (ref 22–32)
Calcium: 8.4 mg/dL — ABNORMAL LOW (ref 8.9–10.3)
Chloride: 104 mmol/L (ref 98–111)
Creatinine, Ser: 0.75 mg/dL (ref 0.61–1.24)
GFR calc Af Amer: >60 mL/min (ref >60)
GFR calc non Af Amer: >60 mL/min (ref >60)
Glucose, Bld: 250 mg/dL — ABNORMAL HIGH (ref 70–99)
Potassium: 4.1 mmol/L (ref 3.5–5.1)
Sodium: 138 mmol/L (ref 135–145)
Total Bilirubin: 1.5 mg/dL — ABNORMAL HIGH (ref 0.3–1.2)
Total Protein: 6.7 g/dL (ref 6.5–8.1)

## 2019-06-11 LAB — CBC WITH DIFFERENTIAL/PLATELET
Abs Immature Granulocytes: 0.03 10*3/uL (ref 0.00–0.07)
Basophils Absolute: 0 10*3/uL (ref 0.0–0.1)
Basophils Relative: 1 %
Eosinophils Absolute: 0.1 10*3/uL (ref 0.0–0.5)
Eosinophils Relative: 2 %
HCT: 42.1 % (ref 39.0–52.0)
Hemoglobin: 14.2 g/dL (ref 13.0–17.0)
Immature Granulocytes: 1 %
Lymphocytes Relative: 22 %
Lymphs Abs: 1.1 10*3/uL (ref 0.7–4.0)
MCH: 31.6 pg (ref 26.0–34.0)
MCHC: 33.7 g/dL (ref 30.0–36.0)
MCV: 93.8 fL (ref 80.0–100.0)
Monocytes Absolute: 0.9 10*3/uL (ref 0.1–1.0)
Monocytes Relative: 19 %
Neutro Abs: 2.7 10*3/uL (ref 1.7–7.7)
Neutrophils Relative %: 55 %
Platelets: 170 10*3/uL (ref 150–400)
RBC: 4.49 MIL/uL (ref 4.22–5.81)
RDW: 12.7 % (ref 11.5–15.5)
WBC: 4.9 10*3/uL (ref 4.0–10.5)
nRBC: 0 % (ref 0.0–0.2)

## 2019-06-11 NOTE — ED Triage Notes (Signed)
Per pt, states he received a flu shot on Wednesday-since then he has had nasal congestion, cough and clearing his throat a lot-states he has not been around anyone with Covid

## 2019-06-11 NOTE — Discharge Instructions (Signed)
See your doctor   You were tested for COVID. Stay home until results come back   Return to ER if you have worse shortness of breath, cough, fever      Person Under Monitoring Name: William Oneill  Location: [address] [place]  [address]   Infection Prevention Recommendations for Individuals Confirmed to have, or Being Evaluated for, 2019 Novel Coronavirus (COVID-19) Infection Who Receive Care at Home  Individuals who are confirmed to have, or are being evaluated for, COVID-19 should follow the prevention steps below until a healthcare provider or local or state health department says they can return to normal activities.  Stay home except to get medical care You should restrict activities outside your home, except for getting medical care. Do not go to work, school, or public areas, and do not use public transportation or taxis.  Call ahead before visiting your doctor Before your medical appointment, call the healthcare provider and tell them that you have, or are being evaluated for, COVID-19 infection. This will help the healthcare providers office take steps to keep other people from getting infected. Ask your healthcare provider to call the local or state health department.  Monitor your symptoms Seek prompt medical attention if your illness is worsening (e.g., difficulty breathing). Before going to your medical appointment, call the healthcare provider and tell them that you have, or are being evaluated for, COVID-19 infection. Ask your healthcare provider to call the local or state health department.  Wear a facemask You should wear a facemask that covers your nose and mouth when you are in the same room with other people and when you visit a healthcare provider. People who live with or visit you should also wear a facemask while they are in the same room with you.  Separate yourself from other people in your home As much as possible, you should stay in a  different room from other people in your home. Also, you should use a separate bathroom, if available.  Avoid sharing household items You should not share dishes, drinking glasses, cups, eating utensils, towels, bedding, or other items with other people in your home. After using these items, you should wash them thoroughly with soap and water.  Cover your coughs and sneezes Cover your mouth and nose with a tissue when you cough or sneeze, or you can cough or sneeze into your sleeve. Throw used tissues in a lined trash can, and immediately wash your hands with soap and water for at least 20 seconds or use an alcohol-based hand rub.  Wash your [facility] your hands often and thoroughly with soap and water for at least 20 seconds. You can use an alcohol-based hand sanitizer if soap and water are not available and if your hands are not visibly dirty. Avoid touching your eyes, nose, and mouth with unwashed hands.   Prevention Steps for Caregivers and Household Members of Individuals Confirmed to have, or Being Evaluated for, COVID-19 Infection Being Cared for in the Home  If you live with, or provide care at home for, a person confirmed to have, or being evaluated for, COVID-19 infection please follow these guidelines to prevent infection:  Follow healthcare providers instructions Make sure that you understand and can help the patient follow any healthcare provider instructions for all care.  Provide for the patients basic needs You should help the patient with basic needs in the home and provide support for getting groceries, prescriptions, and other personal needs.  Monitor the patients  symptoms If they are getting sicker, call his or her medical provider and tell them that the patient has, or is being evaluated for, COVID-19 infection. This will help the healthcare providers office take steps to keep other people from getting infected. Ask the healthcare provider to call the local  or state health department.  Limit the number of people who have contact with the patient If possible, have only one caregiver for the patient. Other household members should stay in another home or place of residence. If this is not possible, they should stay in another room, or be separated from the patient as much as possible. Use a separate bathroom, if available. Restrict visitors who do not have an essential need to be in the home.  Keep older adults, very young children, and other sick people away from the patient Keep older adults, very young children, and those who have compromised immune systems or chronic health conditions away from the patient. This includes people with chronic heart, lung, or kidney conditions, diabetes, and cancer.  Ensure good ventilation Make sure that shared spaces in the home have good air flow, such as from an air conditioner or an opened window, weather permitting.  Wash your hands often Wash your hands often and thoroughly with soap and water for at least 20 seconds. You can use an alcohol based hand sanitizer if soap and water are not available and if your hands are not visibly dirty. Avoid touching your eyes, nose, and mouth with unwashed hands. Use disposable paper towels to dry your hands. If not available, use dedicated cloth towels and replace them when they become wet.  Wear a facemask and gloves Wear a disposable facemask at all times in the room and gloves when you touch or have contact with the patients blood, body fluids, and/or secretions or excretions, such as sweat, saliva, sputum, nasal mucus, vomit, urine, or feces.  Ensure the mask fits over your nose and mouth tightly, and do not touch it during use. Throw out disposable facemasks and gloves after using them. Do not reuse. Wash your hands immediately after removing your facemask and gloves. If your personal clothing becomes contaminated, carefully remove clothing and launder. Wash your  hands after handling contaminated clothing. Place all used disposable facemasks, gloves, and other waste in a lined container before disposing them with other household waste. Remove gloves and wash your hands immediately after handling these items.  Do not share dishes, glasses, or other household items with the patient Avoid sharing household items. You should not share dishes, drinking glasses, cups, eating utensils, towels, bedding, or other items with a patient who is confirmed to have, or being evaluated for, COVID-19 infection. After the person uses these items, you should wash them thoroughly with soap and water.  Wash laundry thoroughly Immediately remove and wash clothes or bedding that have blood, body fluids, and/or secretions or excretions, such as sweat, saliva, sputum, nasal mucus, vomit, urine, or feces, on them. Wear gloves when handling laundry from the patient. Read and follow directions on labels of laundry or clothing items and detergent. In general, wash and dry with the warmest temperatures recommended on the label.  Clean all areas the individual has used often Clean all touchable surfaces, such as counters, tabletops, doorknobs, bathroom fixtures, toilets, phones, keyboards, tablets, and bedside tables, every day. Also, clean any surfaces that may have blood, body fluids, and/or secretions or excretions on them. Wear gloves when cleaning surfaces the patient has come in contact  with. Use a diluted bleach solution (e.g., dilute bleach with 1 part bleach and 10 parts water) or a household disinfectant with a label that says EPA-registered for coronaviruses. To make a bleach solution at home, add 1 tablespoon of bleach to 1 quart (4 cups) of water. For a larger supply, add  cup of bleach to 1 gallon (16 cups) of water. Read labels of cleaning products and follow recommendations provided on product labels. Labels contain instructions for safe and effective use of the cleaning  product including precautions you should take when applying the product, such as wearing gloves or eye protection and making sure you have good ventilation during use of the product. Remove gloves and wash hands immediately after cleaning.  Monitor yourself for signs and symptoms of illness Caregivers and household members are considered close contacts, should monitor their health, and will be asked to limit movement outside of the home to the extent possible. Follow the monitoring steps for close contacts listed on the symptom monitoring form.   ? If you have additional questions, contact your local health department or call the epidemiologist on call at [phone] (available 24/7). ? This guidance is subject to change. For the most up-to-date guidance from [facility], please refer to their website: YouB[facility].pl
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
## 2019-06-12 LAB — SARS CORONAVIRUS 2 (TAT 6-24 HRS): SARS Coronavirus 2: POSITIVE — AB

## 2019-06-13 ENCOUNTER — Other Ambulatory Visit: Payer: Self-pay

## 2019-06-13 ENCOUNTER — Inpatient Hospital Stay: Payer: Medicare Other | Attending: Hematology

## 2019-06-13 ENCOUNTER — Telehealth: Payer: Self-pay | Admitting: Family Medicine

## 2019-06-13 VITALS — BP 130/55 | HR 68 | Temp 98.7°F | Resp 16

## 2019-06-13 DIAGNOSIS — Z85038 Personal history of other malignant neoplasm of large intestine: Secondary | ICD-10-CM | POA: Insufficient documentation

## 2019-06-13 DIAGNOSIS — Z95828 Presence of other vascular implants and grafts: Secondary | ICD-10-CM

## 2019-06-13 DIAGNOSIS — C180 Malignant neoplasm of cecum: Secondary | ICD-10-CM

## 2019-06-13 DIAGNOSIS — Z452 Encounter for adjustment and management of vascular access device: Secondary | ICD-10-CM | POA: Diagnosis present

## 2019-06-13 MED ORDER — HEPARIN SOD (PORK) LOCK FLUSH 100 UNIT/ML IV SOLN
500.0000 [IU] | Freq: Once | INTRAVENOUS | Status: AC | PRN
  Administered 2019-06-13: 500 [IU]
  Filled 2019-06-13: qty 5

## 2019-06-13 MED ORDER — SODIUM CHLORIDE 0.9% FLUSH
10.0000 mL | INTRAVENOUS | Status: DC | PRN
  Administered 2019-06-13: 10 mL
  Filled 2019-06-13: qty 10

## 2019-06-13 NOTE — Patient Instructions (Signed)

## 2019-06-13 NOTE — Telephone Encounter (Signed)
Discussed results of Covid test results.   Feeling fine today, resting this weekend and feeling better. Drinking and eating ok. Denies confusion, no dyspnea. Min cough - has improved. No change in taste/smell. No fevers.  Initial day of symptoms 11/6. Did go to cancer center today but wore mask.  Symptomatic care discussed. telemed visit planned later this week, but ER precautions discussed with understanding expressed.

## 2019-06-15 NOTE — Telephone Encounter (Signed)
Called to schedule no answer Tried both numbers on file

## 2019-06-20 ENCOUNTER — Encounter: Payer: Self-pay | Admitting: Radiology

## 2019-07-25 LAB — HM DIABETES EYE EXAM

## 2019-08-02 ENCOUNTER — Encounter: Payer: Self-pay | Admitting: Family Medicine

## 2019-08-10 ENCOUNTER — Telehealth: Payer: Self-pay | Admitting: Hematology

## 2019-08-10 NOTE — Telephone Encounter (Signed)
Returned patient's phone call regarding rescheduling an appointment, left a voicemail. 

## 2019-08-12 ENCOUNTER — Inpatient Hospital Stay: Payer: Medicare Other

## 2019-08-12 ENCOUNTER — Inpatient Hospital Stay: Payer: Medicare Other | Admitting: Hematology

## 2019-08-12 ENCOUNTER — Telehealth: Payer: Self-pay | Admitting: Hematology

## 2019-08-12 NOTE — Telephone Encounter (Signed)
R/s appt per 1/8 sch message- unable to reach pt . Left message with appt date and time   
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
## 2019-08-25 ENCOUNTER — Inpatient Hospital Stay (HOSPITAL_BASED_OUTPATIENT_CLINIC_OR_DEPARTMENT_OTHER): Payer: Medicare Other | Admitting: Hematology

## 2019-08-25 ENCOUNTER — Telehealth: Payer: Self-pay | Admitting: *Deleted

## 2019-08-25 ENCOUNTER — Encounter: Payer: Self-pay | Admitting: Hematology

## 2019-08-25 ENCOUNTER — Inpatient Hospital Stay: Payer: Medicare Other

## 2019-08-25 ENCOUNTER — Inpatient Hospital Stay: Payer: Medicare Other | Attending: Hematology

## 2019-08-25 ENCOUNTER — Other Ambulatory Visit: Payer: Self-pay

## 2019-08-25 VITALS — BP 130/81 | HR 60 | Temp 98.2°F | Ht 65.0 in | Wt 177.1 lb

## 2019-08-25 DIAGNOSIS — Z9221 Personal history of antineoplastic chemotherapy: Secondary | ICD-10-CM | POA: Diagnosis not present

## 2019-08-25 DIAGNOSIS — Z85038 Personal history of other malignant neoplasm of large intestine: Secondary | ICD-10-CM | POA: Insufficient documentation

## 2019-08-25 DIAGNOSIS — Z794 Long term (current) use of insulin: Secondary | ICD-10-CM | POA: Insufficient documentation

## 2019-08-25 DIAGNOSIS — E119 Type 2 diabetes mellitus without complications: Secondary | ICD-10-CM | POA: Diagnosis not present

## 2019-08-25 DIAGNOSIS — Z9049 Acquired absence of other specified parts of digestive tract: Secondary | ICD-10-CM | POA: Diagnosis not present

## 2019-08-25 DIAGNOSIS — Z79899 Other long term (current) drug therapy: Secondary | ICD-10-CM | POA: Insufficient documentation

## 2019-08-25 DIAGNOSIS — E7800 Pure hypercholesterolemia, unspecified: Secondary | ICD-10-CM | POA: Insufficient documentation

## 2019-08-25 DIAGNOSIS — C180 Malignant neoplasm of cecum: Secondary | ICD-10-CM

## 2019-08-25 DIAGNOSIS — Z8616 Personal history of COVID-19: Secondary | ICD-10-CM | POA: Diagnosis not present

## 2019-08-25 DIAGNOSIS — G4700 Insomnia, unspecified: Secondary | ICD-10-CM | POA: Insufficient documentation

## 2019-08-25 DIAGNOSIS — I4891 Unspecified atrial fibrillation: Secondary | ICD-10-CM | POA: Diagnosis not present

## 2019-08-25 DIAGNOSIS — K769 Liver disease, unspecified: Secondary | ICD-10-CM | POA: Diagnosis not present

## 2019-08-25 DIAGNOSIS — Z95828 Presence of other vascular implants and grafts: Secondary | ICD-10-CM

## 2019-08-25 LAB — CBC WITH DIFFERENTIAL (CANCER CENTER ONLY)
Abs Immature Granulocytes: 0.04 10*3/uL (ref 0.00–0.07)
Basophils Absolute: 0.1 10*3/uL (ref 0.0–0.1)
Basophils Relative: 1 %
Eosinophils Absolute: 0.3 10*3/uL (ref 0.0–0.5)
Eosinophils Relative: 5 %
HCT: 39.4 % (ref 39.0–52.0)
Hemoglobin: 13.6 g/dL (ref 13.0–17.0)
Immature Granulocytes: 1 %
Lymphocytes Relative: 21 %
Lymphs Abs: 1.3 10*3/uL (ref 0.7–4.0)
MCH: 31.3 pg (ref 26.0–34.0)
MCHC: 34.5 g/dL (ref 30.0–36.0)
MCV: 90.8 fL (ref 80.0–100.0)
Monocytes Absolute: 0.7 10*3/uL (ref 0.1–1.0)
Monocytes Relative: 12 %
Neutro Abs: 3.8 10*3/uL (ref 1.7–7.7)
Neutrophils Relative %: 60 %
Platelet Count: 227 10*3/uL (ref 150–400)
RBC: 4.34 MIL/uL (ref 4.22–5.81)
RDW: 12.9 % (ref 11.5–15.5)
WBC Count: 6.2 10*3/uL (ref 4.0–10.5)
nRBC: 0 % (ref 0.0–0.2)

## 2019-08-25 LAB — CMP (CANCER CENTER ONLY)
ALT: 15 U/L (ref 0–44)
AST: 16 U/L (ref 15–41)
Albumin: 3.7 g/dL (ref 3.5–5.0)
Alkaline Phosphatase: 98 U/L (ref 38–126)
Anion gap: 7 (ref 5–15)
BUN: 17 mg/dL (ref 8–23)
CO2: 24 mmol/L (ref 22–32)
Calcium: 8.4 mg/dL — ABNORMAL LOW (ref 8.9–10.3)
Chloride: 108 mmol/L (ref 98–111)
Creatinine: 0.79 mg/dL (ref 0.61–1.24)
GFR, Est AFR Am: >60 mL/min (ref >60)
GFR, Estimated: >60 mL/min (ref >60)
Glucose, Bld: 190 mg/dL — ABNORMAL HIGH (ref 70–99)
Potassium: 4.1 mmol/L (ref 3.5–5.1)
Sodium: 139 mmol/L (ref 135–145)
Total Bilirubin: 1.8 mg/dL — ABNORMAL HIGH (ref 0.3–1.2)
Total Protein: 6.4 g/dL — ABNORMAL LOW (ref 6.5–8.1)

## 2019-08-25 LAB — CEA (IN HOUSE-CHCC): CEA (CHCC-In House): 1.19 ng/mL (ref 0.00–5.00)

## 2019-08-25 MED ORDER — SODIUM CHLORIDE 0.9% FLUSH
10.0000 mL | INTRAVENOUS | Status: DC | PRN
  Administered 2019-08-25: 10 mL
  Filled 2019-08-25: qty 10

## 2019-08-25 MED ORDER — HEPARIN SOD (PORK) LOCK FLUSH 100 UNIT/ML IV SOLN
500.0000 [IU] | Freq: Once | INTRAVENOUS | Status: AC | PRN
  Administered 2019-08-25: 500 [IU]
  Filled 2019-08-25: qty 5

## 2019-08-25 NOTE — Telephone Encounter (Signed)
Per Cira Rue, NP, called to make pt aware of normal CEA results. Message was left on pt vmail, advised if there were questions or concerns to call office

## 2019-08-25 NOTE — Telephone Encounter (Signed)
-----   Message from [name], NP sent at 08/25/2019  3:14 PM EST ----- Please let him know CEA is normal.  Thanks, Regan Rakers

## 2019-08-26 ENCOUNTER — Telehealth: Payer: Self-pay | Admitting: Hematology

## 2019-08-26 NOTE — Telephone Encounter (Signed)
Scheduled appt per 1/21 los.  Was not able to reach pt. Sent a message to HIM pool to get a calendar mailed out.

## 2019-09-07 ENCOUNTER — Encounter: Payer: Self-pay | Admitting: Family Medicine

## 2019-09-07 ENCOUNTER — Other Ambulatory Visit: Payer: Self-pay

## 2019-09-07 ENCOUNTER — Ambulatory Visit (INDEPENDENT_AMBULATORY_CARE_PROVIDER_SITE_OTHER): Payer: Medicare Other | Admitting: Family Medicine

## 2019-09-07 VITALS — BP 142/65 | HR 72 | Temp 98.8°F | Ht 66.0 in | Wt 175.0 lb

## 2019-09-07 DIAGNOSIS — Z794 Long term (current) use of insulin: Secondary | ICD-10-CM

## 2019-09-07 DIAGNOSIS — R030 Elevated blood-pressure reading, without diagnosis of hypertension: Secondary | ICD-10-CM

## 2019-09-07 DIAGNOSIS — R809 Proteinuria, unspecified: Secondary | ICD-10-CM | POA: Diagnosis not present

## 2019-09-07 DIAGNOSIS — E785 Hyperlipidemia, unspecified: Secondary | ICD-10-CM

## 2019-09-07 DIAGNOSIS — E1165 Type 2 diabetes mellitus with hyperglycemia: Secondary | ICD-10-CM | POA: Diagnosis not present

## 2019-09-07 DIAGNOSIS — E1129 Type 2 diabetes mellitus with other diabetic kidney complication: Secondary | ICD-10-CM

## 2019-09-07 DIAGNOSIS — E119 Type 2 diabetes mellitus without complications: Secondary | ICD-10-CM

## 2019-09-07 LAB — GLUCOSE, POCT (MANUAL RESULT ENTRY): POC Glucose: 63 mg/dl — AB (ref 70–99)

## 2019-09-07 LAB — POCT GLYCOSYLATED HEMOGLOBIN (HGB A1C): Hemoglobin A1C: 7.7 % — AB (ref 4.0–5.6)

## 2019-09-07 MED ORDER — SITAGLIPTIN PHOSPHATE 100 MG PO TABS: 100.0000 mg | ORAL_TABLET | Freq: Every day | ORAL | 1 refills | Status: DC

## 2019-09-07 MED ORDER — LISINOPRIL 5 MG PO TABS: 2.5000 mg | ORAL_TABLET | Freq: Every day | ORAL | 1 refills | Status: DC

## 2019-09-07 MED ORDER — METFORMIN HCL 1000 MG PO TABS: 1000.0000 mg | ORAL_TABLET | Freq: Two times a day (BID) | ORAL | 1 refills | Status: DC

## 2019-09-07 MED ORDER — LANTUS SOLOSTAR 100 UNIT/ML ~~LOC~~ SOPN: 7.0000 [IU] | PEN_INJECTOR | Freq: Every day | SUBCUTANEOUS | 3 refills | Status: DC

## 2019-09-07 MED ORDER — ATORVASTATIN CALCIUM 10 MG PO TABS: 10.0000 mg | ORAL_TABLET | Freq: Every day | ORAL | 2 refills | Status: DC

## 2019-09-07 NOTE — Patient Instructions (Addendum)
  Blood pressure borderline elevated today.  Test for protein in the urine was slightly elevated last year as well.  For these 2 reasons I will start you on a medicine that can help lower blood pressure as well as protect kidneys, lisinopril 1/2 pill/day.  If any new cough or any side effects let me know. Stop it if any lightheadedness/dizziness.   No change in meds for now, but make sure to take lantus every night. If any low blood sugars, let me know right away.   Recheck in 3 months for repeat testing. If you notice readings are still in the 200's in the next few weeks, schedule follow up visit sooner.   Return to the clinic or go to the nearest emergency room if any of your symptoms worsen or new symptoms occur.     If you have lab work done today you will be contacted with your lab results within the next 2 weeks.  If you have not heard from [place] then please contact us. The fastest way to get your results is to register for My Chart.   IF you received an x-ray today, you will receive an invoice from [facility]. Please contact [facility] at [phone] with questions or concerns regarding your invoice.   IF you received labwork today, you will receive an invoice from [facility]. Please contact [facility] at [phone] with questions or concerns regarding your invoice.   Our billing staff will not be able to assist you with questions regarding bills from these companies.  You will be contacted with the lab results as soon as they are available. The fastest way to get your results is to activate your My Chart account. Instructions are located on the last page of this paperwork. If you have not heard from [place] regarding the results in 2 weeks, please contact this office.        If you have lab work done today you will be contacted with your lab results within the next 2 weeks.  If you have not heard from [place] then please contact us. The fastest way to get your results is to  register for My Chart.   IF you received an x-ray today, you will receive an invoice from [facility]iology. Please contact [facility] Radiology at [phone] with questions or concerns regarding your invoice.   IF you received labwork today, you will receive an invoice from [facility]. Please contact [facility] at [phone] with questions or concerns regarding your invoice.   Our billing staff will not be able to assist you with questions regarding bills from these companies.  You will be contacted with the lab results as soon as they are available. The fastest way to get your results is to activate your My Chart account. Instructions are located on the last page of this paperwork. If you have not heard from [place] regarding the results in 2 weeks, please contact this office.
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
## 2019-09-08 LAB — LIPID PANEL
Chol/HDL Ratio: 2.4 ratio (ref 0.0–5.0)
Cholesterol, Total: 144 mg/dL (ref 100–199)
HDL: 59 mg/dL (ref >39)
LDL Chol Calc (NIH): 70 mg/dL (ref 0–99)
Triglycerides: 79 mg/dL (ref 0–149)
VLDL Cholesterol Cal: 15 mg/dL (ref 5–40)

## 2019-09-08 LAB — COMPREHENSIVE METABOLIC PANEL
ALT: 17 IU/L (ref 0–44)
AST: 20 IU/L (ref 0–40)
Albumin/Globulin Ratio: 1.8 (ref 1.2–2.2)
Albumin: 4.3 g/dL (ref 3.7–4.7)
Alkaline Phosphatase: 88 IU/L (ref 39–117)
BUN/Creatinine Ratio: 22 (ref 10–24)
BUN: 19 mg/dL (ref 8–27)
Bilirubin Total: 1.4 mg/dL — ABNORMAL HIGH (ref 0.0–1.2)
CO2: 22 mmol/L (ref 20–29)
Calcium: 9.2 mg/dL (ref 8.6–10.2)
Chloride: 107 mmol/L — ABNORMAL HIGH (ref 96–106)
Creatinine, Ser: 0.87 mg/dL (ref 0.76–1.27)
GFR calc Af Amer: 100 mL/min/{1.73_m2} (ref >59)
GFR calc non Af Amer: 86 mL/min/{1.73_m2} (ref >59)
Globulin, Total: 2.4 g/dL (ref 1.5–4.5)
Glucose: 67 mg/dL (ref 65–99)
Potassium: 4.2 mmol/L (ref 3.5–5.2)
Sodium: 141 mmol/L (ref 134–144)
Total Protein: 6.7 g/dL (ref 6.0–8.5)

## 2019-09-21 ENCOUNTER — Emergency Department (HOSPITAL_COMMUNITY): Payer: Medicare Other

## 2019-09-21 ENCOUNTER — Encounter: Payer: Self-pay | Admitting: Radiology

## 2019-09-21 ENCOUNTER — Encounter (HOSPITAL_COMMUNITY): Payer: Self-pay

## 2019-09-21 ENCOUNTER — Emergency Department (HOSPITAL_COMMUNITY)
Admission: EM | Admit: 2019-09-21 | Discharge: 2019-09-21 | Disposition: A | Discharge status: Home / Self Care | Payer: Medicare Other | Attending: Emergency Medicine | Admitting: Emergency Medicine

## 2019-09-21 DIAGNOSIS — E119 Type 2 diabetes mellitus without complications: Secondary | ICD-10-CM | POA: Diagnosis not present

## 2019-09-21 DIAGNOSIS — T148XXA Other injury of unspecified body region, initial encounter: Secondary | ICD-10-CM

## 2019-09-21 DIAGNOSIS — Y998 Other external cause status: Secondary | ICD-10-CM | POA: Diagnosis not present

## 2019-09-21 DIAGNOSIS — Y92410 Unspecified street and highway as the place of occurrence of the external cause: Secondary | ICD-10-CM | POA: Diagnosis not present

## 2019-09-21 DIAGNOSIS — I10 Essential (primary) hypertension: Secondary | ICD-10-CM | POA: Insufficient documentation

## 2019-09-21 DIAGNOSIS — Y9389 Activity, other specified: Secondary | ICD-10-CM | POA: Insufficient documentation

## 2019-09-21 DIAGNOSIS — Z794 Long term (current) use of insulin: Secondary | ICD-10-CM | POA: Insufficient documentation

## 2019-09-21 DIAGNOSIS — S0990XA Unspecified injury of head, initial encounter: Secondary | ICD-10-CM | POA: Diagnosis not present

## 2019-09-21 DIAGNOSIS — S3992XA Unspecified injury of lower back, initial encounter: Secondary | ICD-10-CM | POA: Diagnosis present

## 2019-09-21 DIAGNOSIS — M542 Cervicalgia: Secondary | ICD-10-CM | POA: Diagnosis not present

## 2019-09-21 DIAGNOSIS — Z79899 Other long term (current) drug therapy: Secondary | ICD-10-CM | POA: Diagnosis not present

## 2019-09-21 DIAGNOSIS — Z85038 Personal history of other malignant neoplasm of large intestine: Secondary | ICD-10-CM | POA: Insufficient documentation

## 2019-09-21 DIAGNOSIS — Z23 Encounter for immunization: Secondary | ICD-10-CM | POA: Diagnosis not present

## 2019-09-21 DIAGNOSIS — S32020A Wedge compression fracture of second lumbar vertebra, initial encounter for closed fracture: Secondary | ICD-10-CM | POA: Insufficient documentation

## 2019-09-21 LAB — COMPREHENSIVE METABOLIC PANEL
ALT: 27 U/L (ref 0–44)
AST: 64 U/L — ABNORMAL HIGH (ref 15–41)
Albumin: 3.8 g/dL (ref 3.5–5.0)
Alkaline Phosphatase: 85 U/L (ref 38–126)
Anion gap: 12 (ref 5–15)
BUN: 26 mg/dL — ABNORMAL HIGH (ref 8–23)
CO2: 21 mmol/L — ABNORMAL LOW (ref 22–32)
Calcium: 9 mg/dL (ref 8.9–10.3)
Chloride: 104 mmol/L (ref 98–111)
Creatinine, Ser: 0.86 mg/dL (ref 0.61–1.24)
GFR calc Af Amer: >60 mL/min (ref >60)
GFR calc non Af Amer: >60 mL/min (ref >60)
Glucose, Bld: 124 mg/dL — ABNORMAL HIGH (ref 70–99)
Potassium: 5.5 mmol/L — ABNORMAL HIGH (ref 3.5–5.1)
Sodium: 137 mmol/L (ref 135–145)
Total Bilirubin: 1.8 mg/dL — ABNORMAL HIGH (ref 0.3–1.2)
Total Protein: 6.4 g/dL — ABNORMAL LOW (ref 6.5–8.1)

## 2019-09-21 LAB — CBC WITH DIFFERENTIAL/PLATELET
Abs Immature Granulocytes: 0.13 10*3/uL — ABNORMAL HIGH (ref 0.00–0.07)
Basophils Absolute: 0.1 10*3/uL (ref 0.0–0.1)
Basophils Relative: 1 %
Eosinophils Absolute: 0.6 10*3/uL — ABNORMAL HIGH (ref 0.0–0.5)
Eosinophils Relative: 6 %
HCT: 42 % (ref 39.0–52.0)
Hemoglobin: 14.1 g/dL (ref 13.0–17.0)
Immature Granulocytes: 2 %
Lymphocytes Relative: 20 %
Lymphs Abs: 1.7 10*3/uL (ref 0.7–4.0)
MCH: 31.5 pg (ref 26.0–34.0)
MCHC: 33.6 g/dL (ref 30.0–36.0)
MCV: 93.8 fL (ref 80.0–100.0)
Monocytes Absolute: 0.9 10*3/uL (ref 0.1–1.0)
Monocytes Relative: 10 %
Neutro Abs: 5.2 10*3/uL (ref 1.7–7.7)
Neutrophils Relative %: 61 %
Platelets: 233 10*3/uL (ref 150–400)
RBC: 4.48 MIL/uL (ref 4.22–5.81)
RDW: 12.7 % (ref 11.5–15.5)
WBC: 8.5 10*3/uL (ref 4.0–10.5)
nRBC: 0 % (ref 0.0–0.2)

## 2019-09-21 LAB — RAPID URINE DRUG SCREEN, HOSP PERFORMED
Amphetamines: NOT DETECTED
Barbiturates: NOT DETECTED
Benzodiazepines: NOT DETECTED
Cocaine: NOT DETECTED
Opiates: NOT DETECTED
Tetrahydrocannabinol: NOT DETECTED

## 2019-09-21 MED ORDER — TIZANIDINE HCL 2 MG PO CAPS: 2.0000 mg | ORAL_CAPSULE | Freq: Two times a day (BID) | ORAL | 0 refills | Status: AC | PRN

## 2019-09-21 MED ORDER — ONDANSETRON HCL 4 MG/2ML IJ SOLN
4.0000 mg | Freq: Once | INTRAMUSCULAR | Status: AC
  Administered 2019-09-21: 4 mg via INTRAVENOUS
  Filled 2019-09-21: qty 2

## 2019-09-21 MED ORDER — HYDROCODONE-ACETAMINOPHEN 5-325 MG PO TABS
1.0000 | ORAL_TABLET | Freq: Once | ORAL | Status: AC
  Administered 2019-09-21: 22:00:00 1 via ORAL
  Filled 2019-09-21: qty 1

## 2019-09-21 MED ORDER — HYDROCODONE-ACETAMINOPHEN 5-325 MG PO TABS: 1.0000 | ORAL_TABLET | Freq: Four times a day (QID) | ORAL | 0 refills | Status: DC | PRN

## 2019-09-21 MED ORDER — BACITRACIN ZINC 500 UNIT/GM EX OINT
TOPICAL_OINTMENT | Freq: Once | CUTANEOUS | Status: AC
  Administered 2019-09-21: 1 via TOPICAL
  Filled 2019-09-21: qty 0.9

## 2019-09-21 MED ORDER — FENTANYL CITRATE (PF) 100 MCG/2ML IJ SOLN
50.0000 ug | Freq: Once | INTRAMUSCULAR | Status: AC
  Administered 2019-09-21: 20:00:00 50 ug via INTRAVENOUS
  Filled 2019-09-21: qty 2

## 2019-09-21 MED ORDER — TETANUS-DIPHTH-ACELL PERTUSSIS 5-2.5-18.5 LF-MCG/0.5 IM SUSP
0.5000 mL | Freq: Once | INTRAMUSCULAR | Status: AC
  Administered 2019-09-21: 0.5 mL via INTRAMUSCULAR
  Filled 2019-09-21: qty 0.5
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
## 2019-09-21 NOTE — Progress Notes (Signed)
Orthopedic Tech Progress Note Patient Details:  William Oneill 1946/04/05 [id number]  Patient ID: William Oneill, male   DOB: 1946/03/30, 74 y.o.   MRN: [id number] Applied tlso brace. Routed order to [facility].  Karolee Stamps 09/21/2019, 10:29 PM

## 2019-09-21 NOTE — ED Notes (Signed)
Medications given and charted per [facility]. Ortho tech at bedside applying TLSO brace. Pt ambulated in room with no problems.

## 2019-09-21 NOTE — ED Notes (Signed)
Pt was discharged from the ED. Pt read and understood discharge paperwork. Pt had vital signs completed. Pt conscious, breathing, and A&Ox4. No distress noted. Pt speaking in complete sentences. Pt brought out of the ED via wheelchair.  
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
## 2019-09-30 ENCOUNTER — Telehealth: Payer: Self-pay | Admitting: Family Medicine
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
## 2019-09-30 NOTE — Telephone Encounter (Signed)
Pts sister called stating that the pt has a compression fracture of L2 vertebrae. She states that the pt is in a lot of pain and that the ibprofen he has been taking is not helping him. She is requesting to have something sent in stronger for him. Please advise.     [place], [place] - [address]  [address] [place] [address]  Phone: [phone] Fax: [phone]  Not a 24 hour pharmacy; exact hours not known.
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
## 2019-10-04 NOTE — Telephone Encounter (Signed)
Called pt to check status.no answer on home phone or  mobile, left VM.  2/26 phone call noted. Has appt with me in 3 days, but I can send in temporary med sooner if still needed. Left message for him to call back with status and if stronger med still needed.

## 2019-10-05 NOTE — Telephone Encounter (Signed)
I also called patient to check on him as well no answer and left a voicemail for him to return call

## 2019-10-06 DIAGNOSIS — S32020A Wedge compression fracture of second lumbar vertebra, initial encounter for closed fracture: Secondary | ICD-10-CM | POA: Insufficient documentation

## 2019-10-06 DIAGNOSIS — R030 Elevated blood-pressure reading, without diagnosis of hypertension: Secondary | ICD-10-CM | POA: Insufficient documentation

## 2019-10-21 ENCOUNTER — Telehealth: Payer: Self-pay | Admitting: Hematology

## 2019-10-21 NOTE — Telephone Encounter (Signed)
Called pt per 3/19 sch message - no answer . Left message for pt to call back to reschedule appt

## 2019-10-24 ENCOUNTER — Inpatient Hospital Stay: Payer: Medicare Other | Attending: Hematology

## 2019-11-22 ENCOUNTER — Other Ambulatory Visit: Payer: Self-pay | Admitting: Student

## 2019-11-22 DIAGNOSIS — S32020A Wedge compression fracture of second lumbar vertebra, initial encounter for closed fracture: Secondary | ICD-10-CM

## 2019-11-23 ENCOUNTER — Telehealth (INDEPENDENT_AMBULATORY_CARE_PROVIDER_SITE_OTHER): Payer: Medicare Other | Admitting: Family Medicine

## 2019-11-23 ENCOUNTER — Other Ambulatory Visit: Payer: Self-pay

## 2019-11-23 ENCOUNTER — Encounter: Payer: Self-pay | Admitting: Family Medicine

## 2019-11-23 VITALS — Ht 66.0 in | Wt 160.0 lb

## 2019-11-23 DIAGNOSIS — R059 Cough, unspecified: Secondary | ICD-10-CM

## 2019-11-23 DIAGNOSIS — R05 Cough: Secondary | ICD-10-CM | POA: Diagnosis not present

## 2019-11-23 MED ORDER — BENZONATATE 100 MG PO CAPS: 100.0000 mg | ORAL_CAPSULE | Freq: Three times a day (TID) | ORAL | 0 refills | Status: DC | PRN

## 2019-11-23 NOTE — Patient Instructions (Signed)
° ° ° °  If you have lab work done today you will be contacted with your lab results within the next 2 weeks.  If you have not heard from us then please contact us. The fastest way to get your results is to register for My Chart. ° ° °IF you received an x-ray today, you will receive an invoice from [facility]. Please contact [facility] at [phone] with questions or concerns regarding your invoice.  ° °IF you received labwork today, you will receive an invoice from [facility]. Please contact [facility] at [phone] with questions or concerns regarding your invoice.  ° °Our billing staff will not be able to assist you with questions regarding bills from these companies. ° °You will be contacted with the lab results as soon as they are available. The fastest way to get your results is to activate your My Chart account. Instructions are located on the last page of this paperwork. If you have not heard from us regarding the results in 2 weeks, please contact this office. °  ° ° ° °
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
## 2019-12-05 ENCOUNTER — Ambulatory Visit
Admission: RE | Admit: 2019-12-05 | Discharge: 2019-12-05 | Disposition: A | Discharge status: Home / Self Care | Payer: Medicare Other | Source: Ambulatory Visit | Attending: Student | Admitting: Student

## 2019-12-05 ENCOUNTER — Other Ambulatory Visit: Payer: Self-pay

## 2019-12-05 ENCOUNTER — Inpatient Hospital Stay: Admission: RE | Admit: 2019-12-05 | Payer: Medicare Other | Source: Ambulatory Visit

## 2019-12-05 DIAGNOSIS — S32020A Wedge compression fracture of second lumbar vertebra, initial encounter for closed fracture: Secondary | ICD-10-CM

## 2019-12-07 ENCOUNTER — Other Ambulatory Visit: Payer: Self-pay

## 2019-12-07 ENCOUNTER — Encounter: Payer: Self-pay | Admitting: Family Medicine

## 2019-12-07 ENCOUNTER — Ambulatory Visit (INDEPENDENT_AMBULATORY_CARE_PROVIDER_SITE_OTHER): Payer: Medicare Other | Admitting: Family Medicine

## 2019-12-07 VITALS — BP 130/60 | HR 69 | Temp 98.1°F | Ht 66.0 in | Wt 162.0 lb

## 2019-12-07 DIAGNOSIS — E785 Hyperlipidemia, unspecified: Secondary | ICD-10-CM | POA: Diagnosis not present

## 2019-12-07 DIAGNOSIS — R3121 Asymptomatic microscopic hematuria: Secondary | ICD-10-CM | POA: Diagnosis not present

## 2019-12-07 DIAGNOSIS — E1165 Type 2 diabetes mellitus with hyperglycemia: Secondary | ICD-10-CM

## 2019-12-07 DIAGNOSIS — Z794 Long term (current) use of insulin: Secondary | ICD-10-CM

## 2019-12-07 LAB — POCT URINALYSIS DIP (MANUAL ENTRY)
Bilirubin, UA: NEGATIVE
Glucose, UA: NEGATIVE mg/dL
Ketones, POC UA: NEGATIVE mg/dL
Nitrite, UA: POSITIVE — AB
Spec Grav, UA: >=1.03 — AB (ref 1.010–1.025)
Urobilinogen, UA: 1 E.U./dL
pH, UA: 6 (ref 5.0–8.0)
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
## 2019-12-07 NOTE — Patient Instructions (Addendum)
  No med changes for now.  Keep up the good work with watching diet.  I will let you know if there are any concerns on your labs.  Recheck in 3 months.    If you have lab work done today you will be contacted with your lab results within the next 2 weeks.  If you have not heard from [place] then please contact us. The fastest way to get your results is to register for My Chart.   IF you received an x-ray today, you will receive an invoice from [facility]. Please contact [facility] at [phone] with questions or concerns regarding your invoice.   IF you received labwork today, you will receive an invoice from [facility]. Please contact [facility] at [phone] with questions or concerns regarding your invoice.   Our billing staff will not be able to assist you with questions regarding bills from these companies.  You will be contacted with the lab results as soon as they are available. The fastest way to get your results is to activate your My Chart account. Instructions are located on the last page of this paperwork. If you have not heard from [place] regarding the results in 2 weeks, please contact this office.

## 2019-12-08 ENCOUNTER — Telehealth: Payer: Self-pay | Admitting: Family Medicine

## 2019-12-08 ENCOUNTER — Encounter: Payer: Self-pay | Admitting: Family Medicine

## 2019-12-08 LAB — CMP14+EGFR
ALT: 18 IU/L (ref 0–44)
AST: 21 IU/L (ref 0–40)
Albumin/Globulin Ratio: 1.9 (ref 1.2–2.2)
Albumin: 4.3 g/dL (ref 3.7–4.7)
Alkaline Phosphatase: 112 IU/L (ref 39–117)
BUN/Creatinine Ratio: 23 (ref 10–24)
BUN: 19 mg/dL (ref 8–27)
Bilirubin Total: 1.7 mg/dL — ABNORMAL HIGH (ref 0.0–1.2)
CO2: 24 mmol/L (ref 20–29)
Calcium: 9.2 mg/dL (ref 8.6–10.2)
Chloride: 107 mmol/L — ABNORMAL HIGH (ref 96–106)
Creatinine, Ser: 0.84 mg/dL (ref 0.76–1.27)
GFR calc Af Amer: 101 mL/min/{1.73_m2} (ref >59)
GFR calc non Af Amer: 87 mL/min/{1.73_m2} (ref >59)
Globulin, Total: 2.3 g/dL (ref 1.5–4.5)
Glucose: 111 mg/dL — ABNORMAL HIGH (ref 65–99)
Potassium: 4.3 mmol/L (ref 3.5–5.2)
Sodium: 143 mmol/L (ref 134–144)
Total Protein: 6.6 g/dL (ref 6.0–8.5)

## 2019-12-08 LAB — HEMOGLOBIN A1C
Est. average glucose Bld gHb Est-mCnc: 128 mg/dL
Hgb A1c MFr Bld: 6.1 % — ABNORMAL HIGH (ref 4.8–5.6)

## 2019-12-08 NOTE — Telephone Encounter (Signed)
Patient missed a call from [place] this morning. No documentation .

## 2019-12-08 NOTE — Telephone Encounter (Signed)
Patient was informed that it was Dr Carlota Raspberry whom tried to call patient. Dr Carlota Raspberry spoke with the patient about his urine sample and his labs

## 2019-12-09 ENCOUNTER — Ambulatory Visit
Admission: RE | Admit: 2019-12-09 | Discharge: 2019-12-09 | Disposition: A | Discharge status: Home / Self Care | Payer: Medicare Other | Source: Ambulatory Visit | Attending: Hematology | Admitting: Hematology

## 2019-12-09 ENCOUNTER — Other Ambulatory Visit: Payer: Self-pay

## 2019-12-09 DIAGNOSIS — C180 Malignant neoplasm of cecum: Secondary | ICD-10-CM

## 2019-12-09 MED ORDER — IOPAMIDOL (ISOVUE-300) INJECTION 61%
100.0000 mL | Freq: Once | INTRAVENOUS | Status: AC | PRN
  Administered 2019-12-09: 09:00:00 100 mL via INTRAVENOUS

## 2019-12-12 ENCOUNTER — Telehealth: Payer: Self-pay | Admitting: Hematology

## 2019-12-12 NOTE — Telephone Encounter (Signed)
R/s appt per 5/10 sch message - unable to reach pt . Left message for patient with appt date and time

## 2019-12-19 ENCOUNTER — Other Ambulatory Visit: Payer: Medicare Other

## 2019-12-20 DIAGNOSIS — Z6825 Body mass index (BMI) 25.0-25.9, adult: Secondary | ICD-10-CM | POA: Insufficient documentation
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
## 2019-12-22 ENCOUNTER — Telehealth: Payer: Self-pay | Admitting: Hematology

## 2019-12-22 ENCOUNTER — Ambulatory Visit (INDEPENDENT_AMBULATORY_CARE_PROVIDER_SITE_OTHER): Payer: Medicare Other | Admitting: Family Medicine

## 2019-12-22 ENCOUNTER — Other Ambulatory Visit: Payer: Self-pay

## 2019-12-22 ENCOUNTER — Inpatient Hospital Stay: Payer: Medicare Other | Attending: Hematology

## 2019-12-22 ENCOUNTER — Inpatient Hospital Stay (HOSPITAL_BASED_OUTPATIENT_CLINIC_OR_DEPARTMENT_OTHER): Payer: Medicare Other | Admitting: Hematology

## 2019-12-22 ENCOUNTER — Inpatient Hospital Stay: Payer: Medicare Other

## 2019-12-22 ENCOUNTER — Ambulatory Visit: Payer: Medicare Other | Admitting: Hematology

## 2019-12-22 VITALS — BP 137/70 | HR 60 | Temp 97.9°F | Resp 20 | Ht 66.0 in | Wt 162.9 lb

## 2019-12-22 DIAGNOSIS — R3121 Asymptomatic microscopic hematuria: Secondary | ICD-10-CM

## 2019-12-22 DIAGNOSIS — Z08 Encounter for follow-up examination after completed treatment for malignant neoplasm: Secondary | ICD-10-CM | POA: Insufficient documentation

## 2019-12-22 DIAGNOSIS — G4700 Insomnia, unspecified: Secondary | ICD-10-CM | POA: Insufficient documentation

## 2019-12-22 DIAGNOSIS — I2510 Atherosclerotic heart disease of native coronary artery without angina pectoris: Secondary | ICD-10-CM | POA: Diagnosis not present

## 2019-12-22 DIAGNOSIS — I700 Atherosclerosis of aorta: Secondary | ICD-10-CM | POA: Insufficient documentation

## 2019-12-22 DIAGNOSIS — E119 Type 2 diabetes mellitus without complications: Secondary | ICD-10-CM | POA: Insufficient documentation

## 2019-12-22 DIAGNOSIS — C180 Malignant neoplasm of cecum: Secondary | ICD-10-CM

## 2019-12-22 DIAGNOSIS — E7800 Pure hypercholesterolemia, unspecified: Secondary | ICD-10-CM | POA: Insufficient documentation

## 2019-12-22 DIAGNOSIS — Z9221 Personal history of antineoplastic chemotherapy: Secondary | ICD-10-CM | POA: Diagnosis not present

## 2019-12-22 DIAGNOSIS — Z85038 Personal history of other malignant neoplasm of large intestine: Secondary | ICD-10-CM | POA: Diagnosis present

## 2019-12-22 DIAGNOSIS — I4891 Unspecified atrial fibrillation: Secondary | ICD-10-CM | POA: Insufficient documentation

## 2019-12-22 DIAGNOSIS — Z79899 Other long term (current) drug therapy: Secondary | ICD-10-CM | POA: Diagnosis not present

## 2019-12-22 DIAGNOSIS — Z794 Long term (current) use of insulin: Secondary | ICD-10-CM | POA: Insufficient documentation

## 2019-12-22 DIAGNOSIS — Z95828 Presence of other vascular implants and grafts: Secondary | ICD-10-CM

## 2019-12-22 LAB — CMP (CANCER CENTER ONLY)
ALT: 18 U/L (ref 0–44)
AST: 19 U/L (ref 15–41)
Albumin: 3.7 g/dL (ref 3.5–5.0)
Alkaline Phosphatase: 102 U/L (ref 38–126)
Anion gap: 6 (ref 5–15)
BUN: 24 mg/dL — ABNORMAL HIGH (ref 8–23)
CO2: 25 mmol/L (ref 22–32)
Calcium: 8.6 mg/dL — ABNORMAL LOW (ref 8.9–10.3)
Chloride: 109 mmol/L (ref 98–111)
Creatinine: 0.8 mg/dL (ref 0.61–1.24)
GFR, Est AFR Am: >60 mL/min (ref >60)
GFR, Estimated: >60 mL/min (ref >60)
Glucose, Bld: 131 mg/dL — ABNORMAL HIGH (ref 70–99)
Potassium: 4.1 mmol/L (ref 3.5–5.1)
Sodium: 140 mmol/L (ref 135–145)
Total Bilirubin: 1.9 mg/dL — ABNORMAL HIGH (ref 0.3–1.2)
Total Protein: 6.4 g/dL — ABNORMAL LOW (ref 6.5–8.1)

## 2019-12-22 LAB — CBC WITH DIFFERENTIAL (CANCER CENTER ONLY)
Abs Immature Granulocytes: 0.04 10*3/uL (ref 0.00–0.07)
Basophils Absolute: 0.1 10*3/uL (ref 0.0–0.1)
Basophils Relative: 2 %
Eosinophils Absolute: 0.3 10*3/uL (ref 0.0–0.5)
Eosinophils Relative: 5 %
HCT: 39.4 % (ref 39.0–52.0)
Hemoglobin: 13.5 g/dL (ref 13.0–17.0)
Immature Granulocytes: 1 %
Lymphocytes Relative: 23 %
Lymphs Abs: 1.3 10*3/uL (ref 0.7–4.0)
MCH: 31.3 pg (ref 26.0–34.0)
MCHC: 34.3 g/dL (ref 30.0–36.0)
MCV: 91.4 fL (ref 80.0–100.0)
Monocytes Absolute: 0.7 10*3/uL (ref 0.1–1.0)
Monocytes Relative: 12 %
Neutro Abs: 3.3 10*3/uL (ref 1.7–7.7)
Neutrophils Relative %: 57 %
Platelet Count: 191 10*3/uL (ref 150–400)
RBC: 4.31 MIL/uL (ref 4.22–5.81)
RDW: 13.2 % (ref 11.5–15.5)
WBC Count: 5.7 10*3/uL (ref 4.0–10.5)
nRBC: 0 % (ref 0.0–0.2)

## 2019-12-22 LAB — POCT URINALYSIS DIP (MANUAL ENTRY)
Bilirubin, UA: NEGATIVE
Glucose, UA: NEGATIVE mg/dL
Ketones, POC UA: NEGATIVE mg/dL
Nitrite, UA: POSITIVE — AB
Spec Grav, UA: 1.025 (ref 1.010–1.025)
Urobilinogen, UA: >=8 E.U./dL — AB
pH, UA: 6.5 (ref 5.0–8.0)

## 2019-12-22 LAB — POC MICROSCOPIC URINALYSIS (UMFC): Mucus: ABSENT

## 2019-12-22 LAB — CEA (IN HOUSE-CHCC): CEA (CHCC-In House): <1 ng/mL (ref 0.00–5.00)

## 2019-12-22 MED ORDER — HEPARIN SOD (PORK) LOCK FLUSH 100 UNIT/ML IV SOLN
500.0000 [IU] | Freq: Once | INTRAVENOUS | Status: AC | PRN
  Administered 2019-12-22: 500 [IU]
  Filled 2019-12-22: qty 5

## 2019-12-22 MED ORDER — SODIUM CHLORIDE 0.9% FLUSH
10.0000 mL | INTRAVENOUS | Status: DC | PRN
  Administered 2019-12-22: 10 mL
  Filled 2019-12-22: qty 10

## 2019-12-22 NOTE — Progress Notes (Signed)
Lab visit only. 

## 2019-12-22 NOTE — Telephone Encounter (Signed)
Scheduled appt per 5/20 los - gave pt AVS and calender .

## 2019-12-25 ENCOUNTER — Encounter: Payer: Self-pay | Admitting: Hematology

## 2019-12-25 LAB — URINE CULTURE

## 2019-12-26 ENCOUNTER — Telehealth: Payer: Self-pay | Admitting: Family Medicine

## 2019-12-26 MED ORDER — CEPHALEXIN 500 MG PO CAPS
500.0000 mg | ORAL_CAPSULE | Freq: Two times a day (BID) | ORAL | 0 refills | Status: DC
Start: 2019-12-26 — End: 2020-02-23

## 2019-12-26 MED ORDER — NITROFURANTOIN MONOHYD MACRO 100 MG PO CAPS
100.0000 mg | ORAL_CAPSULE | Freq: Two times a day (BID) | ORAL | 0 refills | Status: DC
Start: 2019-12-26 — End: 2019-12-26

## 2019-12-26 NOTE — Telephone Encounter (Signed)
See lab notes. Called pt with results/plan.   initially sent Macrobid to [facility]- called and left VM to cancel.   Keflex 500mg  BID for 7 days.  to [facility] (phone in Rx).

## 2020-01-04 ENCOUNTER — Telehealth: Payer: Self-pay

## 2020-01-04 ENCOUNTER — Telehealth: Payer: Self-pay | Admitting: Family Medicine

## 2020-01-04 NOTE — Telephone Encounter (Signed)
Pt came in and the medication that was sent in By Dr. Carlota Raspberry on 12/26/19 The Keflex 500mg  BID for 7 days, was suppose to be sent to   [place], [place] - [address] Please advise.

## 2020-01-04 NOTE — Telephone Encounter (Signed)
With Dr. Vonna Kotyk permission, I called [facility] to do a verbal order. When I originally called they said that have not received the request for the Keflex Rx

## 2020-01-04 NOTE — Telephone Encounter (Signed)
LVM to let pt know that his medication has been sent to pharmacy

## 2020-01-05 ENCOUNTER — Other Ambulatory Visit: Payer: Self-pay | Admitting: General Surgery

## 2020-01-27 LAB — HM DIABETES EYE EXAM

## 2020-02-15 ENCOUNTER — Other Ambulatory Visit: Payer: Self-pay | Admitting: Family Medicine

## 2020-02-15 NOTE — Telephone Encounter (Deleted)
Requested medication (s) are due for refill today: no  Requested medication (s) are on the active medication list: yes  Future visit scheduled:no  Notes to clinic:  patient last seen by office 10/2018 Should patient still be taking medication    Requested Prescriptions  Pending Prescriptions Disp Refills   glipiZIDE (GLUCOTROL) 10 MG tablet [Pharmacy Med Name: GLIPIZIDE 10 MG TAB] 180 tablet     Sig: TAKE ONE TABLET TWICE DAILY BEFORE A MEAL      Endocrinology:  Diabetes - Sulfonylureas Failed - 02/15/2020 11:14 AM      Failed - HBA1C is between 0 and 7.9 and within 180 days    No results found for: HGBA1C, LABA1C        Failed - Valid encounter within last 6 months    Recent Outpatient Visits   None

## 2020-02-16 ENCOUNTER — Other Ambulatory Visit: Payer: Self-pay

## 2020-02-16 ENCOUNTER — Encounter (HOSPITAL_COMMUNITY)
Admission: RE | Admit: 2020-02-16 | Discharge: 2020-02-16 | Disposition: A | Discharge status: Home / Self Care | Payer: Medicare Other | Source: Ambulatory Visit | Attending: General Surgery | Admitting: General Surgery

## 2020-02-16 ENCOUNTER — Encounter (HOSPITAL_COMMUNITY): Payer: Self-pay

## 2020-02-16 ENCOUNTER — Telehealth: Payer: Self-pay | Admitting: Emergency Medicine

## 2020-02-16 DIAGNOSIS — Z01812 Encounter for preprocedural laboratory examination: Secondary | ICD-10-CM | POA: Insufficient documentation

## 2020-02-16 LAB — BASIC METABOLIC PANEL
Anion gap: 7 (ref 5–15)
BUN: 17 mg/dL (ref 8–23)
CO2: 27 mmol/L (ref 22–32)
Calcium: 9 mg/dL (ref 8.9–10.3)
Chloride: 107 mmol/L (ref 98–111)
Creatinine, Ser: 0.95 mg/dL (ref 0.61–1.24)
GFR calc Af Amer: >60 mL/min (ref >60)
GFR calc non Af Amer: >60 mL/min (ref >60)
Glucose, Bld: 130 mg/dL — ABNORMAL HIGH (ref 70–99)
Potassium: 3.9 mmol/L (ref 3.5–5.1)
Sodium: 141 mmol/L (ref 135–145)

## 2020-02-16 LAB — GLUCOSE, CAPILLARY: Glucose-Capillary: 154 mg/dL — ABNORMAL HIGH (ref 70–99)

## 2020-02-16 LAB — CBC
HCT: 42.2 % (ref 39.0–52.0)
Hemoglobin: 13.9 g/dL (ref 13.0–17.0)
MCH: 30.8 pg (ref 26.0–34.0)
MCHC: 32.9 g/dL (ref 30.0–36.0)
MCV: 93.4 fL (ref 80.0–100.0)
Platelets: 240 10*3/uL (ref 150–400)
RBC: 4.52 MIL/uL (ref 4.22–5.81)
RDW: 12.2 % (ref 11.5–15.5)
WBC: 8.3 10*3/uL (ref 4.0–10.5)
nRBC: 0 % (ref 0.0–0.2)

## 2020-02-16 LAB — HEMOGLOBIN A1C
Hgb A1c MFr Bld: 6.5 % — ABNORMAL HIGH (ref 4.8–5.6)
Mean Plasma Glucose: 139.85 mg/dL

## 2020-02-16 MED ORDER — LACTATED RINGERS IV SOLN
INTRAVENOUS | Status: DC
Start: 2020-02-16 — End: 2020-02-16

## 2020-02-16 MED ORDER — CHLORHEXIDINE GLUCONATE 0.12 % MT SOLN
15.0000 mL | Freq: Once | OROMUCOSAL | Status: DC
Start: 2020-02-16 — End: 2020-02-16

## 2020-02-16 MED ORDER — ORAL CARE MOUTH RINSE: 15.0000 mL | Freq: Once | OROMUCOSAL | Status: DC

## 2020-02-16 NOTE — Telephone Encounter (Addendum)
Request was sent over for the right person but wrong dob and chart.Solmon Ice at PCP has updated the request to the correct patient chart. I have contact the pharmacy and made them aware

## 2020-02-16 NOTE — Telephone Encounter (Signed)
This is the wrong patient. The correct person is William Oneill account number [id number]. I put a note in the correct patient chart. Not sure what steps you need to take to take this msg out of the [id number] chart.

## 2020-02-16 NOTE — Progress Notes (Signed)
PCP -  Dr. Cindee Lame  Cardiologist -   Chest x-ray - 06/11/2019  EKG - 02/16/2020  Stress Test -   ECHO -   Cardiac Cath -   Sleep Study - 2016- negative for sleep apena CPAP - na  LABS- CBC, BMP, Hemoglobin A1C  ASA-na  ERAS-yes, clears until 0430  HA1C- Fasting Blood Sugar -  Checks Blood Sugar _____ times a day  Anesthesia-  Pt denies having chest pain, sob, or fever at this time. All instructions explained to the pt, with a verbal understanding of the material. Pt agrees to go over the instructions while at home for a better understanding. Pt also instructed to self quarantine after being tested for COVID-19. The opportunity to ask questions was provided.

## 2020-02-16 NOTE — Telephone Encounter (Signed)
Spoke to patient he had a question which was sent by the [facility] center but place into wrong account number [id number]. This is the correct patient. Patient want to know is he supposed to still be taking Glipizied? Looking through patient's chart this was a med that was stopped back in 2019 by Dr. Carlota Raspberry. So the answer is No he is not to be still taking this medication.

## 2020-02-16 NOTE — Pre-Procedure Instructions (Addendum)
Tripton Ned  02/16/2020       Your procedure is scheduled on Thursday, July 22..  Report to [facility], Main Entrance or Entrance "A" at 5:30 AM                      Your surgery or procedure is scheduled to be 7:30 A.M.   Call this number if you have problems the morning of surgery: [phone]  This is the number for the Pre- Surgical Desk.                  For any other questions, please call [phone], Monday - Friday 8 AM - 4 PM.   Remember:  Do not eat  after midnight Wednesday, July 21.  You may drink clear liquids until 4:30 AM   Clear liquids allowed are:  Water, Juice (non-citric and without pulp - diabetics please choose diet or no sugar options), Carbonated beverages - (diabetics please choose diet or no sugar options), Clear Tea, Black Coffee only (no creamer, milk or cream including half and half), Plain Jell-O only (diabetics please choose diet or no sugar options), Gatorade (diabetics please choose diet or no sugar options) and Plain Popsicles only  Nothing else to drink after 4:30 AM   Take these medicines the morning of surgery with A SIP OF WATER: None  STOP taking Aspirin, Aspirin Products (Goody Powder, Excedrin Migraine), Ibuprofen (Advil), Naproxen (Aleve), Vitamins and Herbal Products (ie Fish Oil). WHAT DO I DO ABOUT MY DIABETES MEDICATION?  DO NOT take metFORMIN (GLUCOPHAGE) the morning of surgery. DO NOT take Januvia  the morning of surgery   How to Manage Your Diabetes  Before and After Surgery  Why is it important to control my blood sugar before and after surgery? . Improving blood sugar levels before and after surgery helps healing and can limit problems. . A way of improving blood sugar control is eating a healthy diet by: o  Eating less sugar and carbohydrates o  Increasing activity/exercise o  Talking with your doctor about reaching your blood sugar goals . High blood sugars (greater than 180 mg/dL) can raise your risk of  infections and slow your recovery, so you will need to focus on controlling your diabetes during the weeks before surgery. . Make sure that the doctor who takes care of your diabetes knows about your planned surgery including the date and location.  How do I manage my blood sugar before surgery? . Check your blood sugar at least 4 times a day, starting 2 days before surgery, to make sure that the level is not too high or low. o Check your blood sugar the morning of your surgery when you wake up and every 2 hours until you get to the Short Stay unit. . If your blood sugar is less than 70 mg/dL, you will need to treat for low blood sugar: o Do not take insulin. o Treat a low blood sugar (less than 70 mg/dL) with  cup of clear juice (cranberry or apple), 4 glucose tablets, OR glucose gel. Recheck blood sugar in 15 minutes after treatment (to make sure it is greater than 70 mg/dL). If your blood sugar is not greater than 70 mg/dL on recheck, call [phone] for further instructions. . Report your blood sugar to the short stay nurse when you get to Short Stay.  . If you are admitted to the hospital after surgery: o Your blood sugar will  be checked by the staff and you will probably be given insulin after surgery (instead of oral diabetes medicines) to make sure you have good blood sugar levels. o The goal for blood sugar control after surgery is 80-180 mg/dL.   [facility]- Preparing For Surgery  Before surgery, you can play an important role. Because skin is not sterile, your skin needs to be as free of germs as possible. You can reduce the number of germs on your skin by washing with CHG (chlorahexidine gluconate) Soap before surgery.  CHG is an antiseptic cleaner which kills germs and bonds with the skin to continue killing germs even after washing.    Oral Hygiene is also important to reduce your risk of infection.  Remember - BRUSH YOUR TEETH THE MORNING OF SURGERY WITH YOUR REGULAR  TOOTHPASTE  Please do not use if you have an allergy to CHG or antibacterial soaps. If your skin becomes reddened/irritated stop using the CHG.  Do not shave (including legs and underarms) for at least 48 hours prior to first CHG shower. It is OK to shave your face.  Please follow these instructions carefully.   1. Shower the NIGHT BEFORE SURGERY and the MORNING OF SURGERY with CHG.   2. If you chose to wash your hair, wash your hair first as usual with your normal shampoo.  3. After you shampoo, wash your face and private area with the soap you use at home, then rinse your hair and body thoroughly to remove the shampoo and soap.  4. Use CHG as you would any other liquid soap. You can apply CHG directly to the skin and wash gently with a scrungie or a clean washcloth.   5. Apply the CHG Soap to your body ONLY FROM THE NECK DOWN.  Do not use on open wounds or open sores. Avoid contact with your eyes, ears, mouth and genitals (private parts).   6. Wash thoroughly, paying special attention to the area where your surgery will be performed.  7. Thoroughly rinse your body with warm water from the neck down.  8. DO NOT shower/wash with your normal soap after using and rinsing off the CHG Soap.  9. Pat yourself dry with a CLEAN TOWEL.  10. Wear CLEAN PAJAMAS to bed the night before surgery, wear comfortable clothes the morning of surgery  11. Place CLEAN SHEETS on your bed the night of your first shower and DO NOT SLEEP WITH PETS. Day of Surgery: Shower as instructed above. Do not apply any deodorants/lotions, powders or colognes.  Please wear clean clothes to the hospital/surgery center.   Remember to brush your teeth WITH YOUR REGULAR TOOTHPASTE  Do not wear jewelry, make-up or nail polish.  Do not bring valuables to the hospital.  [facility] is not responsible for any belongings or valuables.  Contacts, dentures or bridgework may not be worn into surgery.  Leave your suitcase in the  car.  After surgery it may be brought to your room.  For patients admitted to the hospital, discharge time will be determined by your treatment team.  Patients discharged the day of surgery will not be allowed to drive home.   Please read over the following fact sheets that you were given: Coughing and Deep Breathing, Pain Booklet and Surgical Site Infections

## 2020-02-16 NOTE — Progress Notes (Addendum)
PCP -  Dr. Cindee Lame  Cardiologist -   Chest x-ray - 06/11/2019  EKG - DOS   Stress Test - no  ECHO - no  Cardiac Cath - no  Sleep Study - 2016- negative for sleep apena CPAP - na  LABS- CBC, BMP, Hemoglobin A1C  ASA-na  ERAS-yes, clears until 0430  HA1C-6.5- 02/16/2020 Fasting Blood Sugar - 140 Checks Blood Sugar ____2_ times a day  Anesthesia-  Pt denies having chest pain, sob, or fever at this time. All instructions explained to the pt, with a verbal understanding of the material. Pt agrees to go over the instructions while at home for a better understanding. Pt also instructed to self quarantine after being tested for COVID-19. The opportunity to ask questions was provided.  Mr. Robar has Afib listed in history - patient said that it was before 2009, I felt funny , I came to [facility] and stayed 2 days.  Patient said that he was not put on medications, was not cardioverted and did not have to follow up with a cardiologist; "it just went away and has not come back."

## 2020-02-16 NOTE — Telephone Encounter (Signed)
Patient msg came through patient wanting to know if he still need to be on medication glipizide. I was calling to see if this patient was the one called about this request. They patient's chart that this msg came from do not have any records and looks like someone made a brand new chart so I was trying top find out which patient actually called.

## 2020-02-16 NOTE — Progress Notes (Signed)
PCP - Dr Chinita Pester  Cardiologist - none  Chest x-ray - 06/11/2019  EKG - DOS  Stress Test - none  ECHO - none  Cardiac Cath - none  Sleep Study -  CPAP -   LABS- CBC, BMP, A1C  ASA-na  ERAS-yes - no beverage  HA1C-6.5- 02/16/2020 Fasting Blood Sugar - 140 Checks Blood Sugar ___2__ times a day  Anesthesia-  Pt denies having chest pain, sob, or fever at this time. All instructions explained to the pt, with a verbal understanding of the material. Pt agrees to go over the instructions while at home for a better understanding. Pt also instructed to self quarantine after being tested for COVID-19. The opportunity to ask questions was provided.

## 2020-02-16 NOTE — Pre-Procedure Instructions (Signed)
Margaret Staggs  02/16/2020       Your procedure is scheduled on Thursday, July 22..  Report to [facility], Main Entrance or Entrance "A" at 5:30 AM                      Your surgery or procedure is scheduled to be 7:30 A.M.   Call this number if you have problems the morning of surgery: [phone]  This is the number for the Pre- Surgical Desk.                  For any other questions, please call [phone], Monday - Friday 8 AM - 4 PM.   Remember:  Do not eat  after midnight Wednesday, July 21.  You may drink clear liquids until 4:30 AM   Clear liquids allowed are:  Water, Juice (non-citric and without pulp - diabetics please choose diet or no sugar options), Carbonated beverages - (diabetics please choose diet or no sugar options), Clear Tea, Black Coffee only (no creamer, milk or cream including half and half), Plain Jell-O only (diabetics please choose diet or no sugar options), Gatorade (diabetics please choose diet or no sugar options) and Plain Popsicles only  Nothing else to drink after 4:30 AM   Take these medicines the morning of surgery with A SIP OF WATER: None  STOP taking Aspirin, Aspirin Products (Goody Powder, Excedrin Migraine), Ibuprofen (Advil), Naproxen (Aleve), Vitamins and Herbal Products (ie Fish Oil). WHAT DO I DO ABOUT MY DIABETES MEDICATION?  DO NOT take glipiZIDE (GLUCOTROL) the evening before surgery or the morning of surgery DO NOT take metFORMIN (GLUCOPHAGE) the morning of surgery.  How to Manage Your Diabetes Before and After Surgery  Why is it important to control my blood sugar before and after surgery? . Improving blood sugar levels before and after surgery helps healing and can limit problems. . A way of improving blood sugar control is eating a healthy diet by: o  Eating less sugar and carbohydrates o  Increasing activity/exercise o  Talking with your doctor about reaching your blood sugar goals . High blood sugars (greater  than 180 mg/dL) can raise your risk of infections and slow your recovery, so you will need to focus on controlling your diabetes during the weeks before surgery. . Make sure that the doctor who takes care of your diabetes knows about your planned surgery including the date and location.  How do I manage my blood sugar before surgery? . Check your blood sugar at least 4 times a day, starting 2 days before surgery, to make sure that the level is not too high or low. o Check your blood sugar the morning of your surgery when you wake up and every 2 hours until you get to the Short Stay unit. . If your blood sugar is less than 70 mg/dL, you will need to treat for low blood sugar: o Do not take insulin. o Treat a low blood sugar (less than 70 mg/dL) with  cup of clear juice (cranberry or apple), 4 glucose tablets, OR glucose gel. Recheck blood sugar in 15 minutes after treatment (to make sure it is greater than 70 mg/dL). If your blood sugar is not greater than 70 mg/dL on recheck, call [phone] for further instructions. . Report your blood sugar to the short stay nurse when you get to Short Stay.  . If you are admitted to the hospital after surgery: o Your  blood sugar will be checked by the staff and you will probably be given insulin after surgery (instead of oral diabetes medicines) to make sure you have good blood sugar levels. o The goal for blood sugar control after surgery is 80-180 mg/dL.   [place]- Preparing For Surgery  Before surgery, you can play an important role. Because skin is not sterile, your skin needs to be as free of germs as possible. You can reduce the number of germs on your skin by washing with CHG (chlorahexidine gluconate) Soap before surgery.  CHG is an antiseptic cleaner which kills germs and bonds with the skin to continue killing germs even after washing.    Oral Hygiene is also important to reduce your risk of infection.  Remember - BRUSH YOUR TEETH THE MORNING  OF SURGERY WITH YOUR REGULAR TOOTHPASTE  Please do not use if you have an allergy to CHG or antibacterial soaps. If your skin becomes reddened/irritated stop using the CHG.  Do not shave (including legs and underarms) for at least 48 hours prior to first CHG shower. It is OK to shave your face.  Please follow these instructions carefully.   1. Shower the NIGHT BEFORE SURGERY and the MORNING OF SURGERY with CHG.   2. If you chose to wash your hair, wash your hair first as usual with your normal shampoo.  3. After you shampoo, wash your face and private area with the soap you use at home, then rinse your hair and body thoroughly to remove the shampoo and soap.  4. Use CHG as you would any other liquid soap. You can apply CHG directly to the skin and wash gently with a scrungie or a clean washcloth.   5. Apply the CHG Soap to your body ONLY FROM THE NECK DOWN.  Do not use on open wounds or open sores. Avoid contact with your eyes, ears, mouth and genitals (private parts).   6. Wash thoroughly, paying special attention to the area where your surgery will be performed.  7. Thoroughly rinse your body with warm water from the neck down.  8. DO NOT shower/wash with your normal soap after using and rinsing off the CHG Soap.  9. Pat yourself dry with a CLEAN TOWEL.  10. Wear CLEAN PAJAMAS to bed the night before surgery, wear comfortable clothes the morning of surgery  11. Place CLEAN SHEETS on your bed the night of your first shower and DO NOT SLEEP WITH PETS. Day of Surgery: Shower as instructed above. Do not apply any deodorants/lotions, powders or colognes.  Please wear clean clothes to the hospital/surgery center.   Remember to brush your teeth WITH YOUR REGULAR TOOTHPASTE  Do not wear jewelry, make-up or nail polish.  Do not bring valuables to the hospital.  [facility] is not responsible for any belongings or valuables.  Contacts, dentures or bridgework may not be worn into surgery.   Leave your suitcase in the car.  After surgery it may be brought to your room.  For patients admitted to the hospital, discharge time will be determined by your treatment team.  Patients discharged the day of surgery will not be allowed to drive home.   Please read over the following fact sheets that you were given: Coughing and Deep Breathing, Pain Booklet and Surgical Site Infections

## 2020-02-16 NOTE — Telephone Encounter (Signed)
This patient you just sent to the [facility] clinic pool do not have any visit attached to this account number. Is this the correct patient?

## 2020-02-20 ENCOUNTER — Other Ambulatory Visit (HOSPITAL_COMMUNITY)
Admission: RE | Admit: 2020-02-20 | Discharge: 2020-02-20 | Disposition: A | Discharge status: Home / Self Care | Payer: Medicare Other | Source: Ambulatory Visit | Attending: General Surgery | Admitting: General Surgery

## 2020-02-20 DIAGNOSIS — Z01812 Encounter for preprocedural laboratory examination: Secondary | ICD-10-CM | POA: Insufficient documentation

## 2020-02-20 DIAGNOSIS — Z20822 Contact with and (suspected) exposure to covid-19: Secondary | ICD-10-CM | POA: Diagnosis not present

## 2020-02-20 LAB — SARS CORONAVIRUS 2 (TAT 6-24 HRS): SARS Coronavirus 2: NEGATIVE

## 2020-02-23 ENCOUNTER — Encounter (HOSPITAL_COMMUNITY)
Admission: RE | Disposition: A | Discharge status: Home / Self Care | Payer: Self-pay | Source: Home / Self Care | Attending: General Surgery

## 2020-02-23 ENCOUNTER — Other Ambulatory Visit: Payer: Self-pay

## 2020-02-23 ENCOUNTER — Encounter (HOSPITAL_COMMUNITY): Payer: Self-pay | Admitting: General Surgery

## 2020-02-23 ENCOUNTER — Ambulatory Visit (HOSPITAL_COMMUNITY): Payer: Medicare Other | Admitting: Registered Nurse

## 2020-02-23 ENCOUNTER — Ambulatory Visit (HOSPITAL_COMMUNITY)
Admission: RE | Admit: 2020-02-23 | Discharge: 2020-02-23 | Disposition: A | Discharge status: Home / Self Care | Payer: Medicare Other | Attending: General Surgery | Admitting: General Surgery

## 2020-02-23 DIAGNOSIS — E7800 Pure hypercholesterolemia, unspecified: Secondary | ICD-10-CM | POA: Diagnosis not present

## 2020-02-23 DIAGNOSIS — Z7984 Long term (current) use of oral hypoglycemic drugs: Secondary | ICD-10-CM | POA: Diagnosis not present

## 2020-02-23 DIAGNOSIS — G4700 Insomnia, unspecified: Secondary | ICD-10-CM | POA: Insufficient documentation

## 2020-02-23 DIAGNOSIS — I10 Essential (primary) hypertension: Secondary | ICD-10-CM | POA: Diagnosis not present

## 2020-02-23 DIAGNOSIS — Z833 Family history of diabetes mellitus: Secondary | ICD-10-CM | POA: Insufficient documentation

## 2020-02-23 DIAGNOSIS — Z9049 Acquired absence of other specified parts of digestive tract: Secondary | ICD-10-CM | POA: Diagnosis not present

## 2020-02-23 DIAGNOSIS — Z8249 Family history of ischemic heart disease and other diseases of the circulatory system: Secondary | ICD-10-CM | POA: Insufficient documentation

## 2020-02-23 DIAGNOSIS — E119 Type 2 diabetes mellitus without complications: Secondary | ICD-10-CM | POA: Insufficient documentation

## 2020-02-23 DIAGNOSIS — I4891 Unspecified atrial fibrillation: Secondary | ICD-10-CM | POA: Diagnosis not present

## 2020-02-23 DIAGNOSIS — Z452 Encounter for adjustment and management of vascular access device: Secondary | ICD-10-CM | POA: Diagnosis present

## 2020-02-23 DIAGNOSIS — Z9221 Personal history of antineoplastic chemotherapy: Secondary | ICD-10-CM | POA: Diagnosis not present

## 2020-02-23 DIAGNOSIS — Z85038 Personal history of other malignant neoplasm of large intestine: Secondary | ICD-10-CM | POA: Diagnosis not present

## 2020-02-23 DIAGNOSIS — Z79899 Other long term (current) drug therapy: Secondary | ICD-10-CM | POA: Diagnosis not present

## 2020-02-23 HISTORY — PX: PORT-A-CATH REMOVAL: SHX5289

## 2020-02-23 LAB — GLUCOSE, CAPILLARY
Glucose-Capillary: 146 mg/dL — ABNORMAL HIGH (ref 70–99)
Glucose-Capillary: 187 mg/dL — ABNORMAL HIGH (ref 70–99)

## 2020-02-23 SURGERY — REMOVAL PORT-A-CATH
Anesthesia: Monitor Anesthesia Care | Site: Chest | Laterality: Left

## 2020-02-23 MED ORDER — ORAL CARE MOUTH RINSE: 15.0000 mL | Freq: Once | OROMUCOSAL | Status: AC

## 2020-02-23 MED ORDER — FENTANYL CITRATE (PF) 250 MCG/5ML IJ SOLN
INTRAMUSCULAR | Status: AC
  Filled 2020-02-23: qty 5

## 2020-02-23 MED ORDER — LIDOCAINE 2% (20 MG/ML) 5 ML SYRINGE
INTRAMUSCULAR | Status: AC
  Filled 2020-02-23: qty 5

## 2020-02-23 MED ORDER — ONDANSETRON HCL 4 MG/2ML IJ SOLN
INTRAMUSCULAR | Status: AC
  Filled 2020-02-23: qty 2

## 2020-02-23 MED ORDER — CHLORHEXIDINE GLUCONATE 0.12 % MT SOLN: 15.0000 mL | Freq: Once | OROMUCOSAL | Status: AC

## 2020-02-23 MED ORDER — LIDOCAINE 2% (20 MG/ML) 5 ML SYRINGE
INTRAMUSCULAR | Status: DC | PRN
  Administered 2020-02-23: 60 mg via INTRAVENOUS

## 2020-02-23 MED ORDER — ACETAMINOPHEN 500 MG PO TABS: 1000.0000 mg | ORAL_TABLET | ORAL | Status: AC

## 2020-02-23 MED ORDER — HYDROCODONE-ACETAMINOPHEN 5-325 MG PO TABS: 1.0000 | ORAL_TABLET | Freq: Four times a day (QID) | ORAL | 0 refills | Status: DC | PRN

## 2020-02-23 MED ORDER — BUPIVACAINE HCL (PF) 0.25 % IJ SOLN
INTRAMUSCULAR | Status: AC
  Filled 2020-02-23: qty 30

## 2020-02-23 MED ORDER — LACTATED RINGERS IV SOLN: INTRAVENOUS | Status: DC

## 2020-02-23 MED ORDER — PROPOFOL 10 MG/ML IV BOLUS
INTRAVENOUS | Status: DC | PRN
  Administered 2020-02-23: 20 mg via INTRAVENOUS

## 2020-02-23 MED ORDER — PROPOFOL 1000 MG/100ML IV EMUL
INTRAVENOUS | Status: AC
  Filled 2020-02-23: qty 100

## 2020-02-23 MED ORDER — LIDOCAINE-EPINEPHRINE 1 %-1:100000 IJ SOLN
INTRAMUSCULAR | Status: DC | PRN
  Administered 2020-02-23: 10 mL via INTRAMUSCULAR

## 2020-02-23 MED ORDER — EPHEDRINE SULFATE 50 MG/ML IJ SOLN
INTRAMUSCULAR | Status: DC | PRN
  Administered 2020-02-23: 5 mg via INTRAVENOUS

## 2020-02-23 MED ORDER — DEXAMETHASONE SODIUM PHOSPHATE 10 MG/ML IJ SOLN
INTRAMUSCULAR | Status: AC
  Filled 2020-02-23: qty 1

## 2020-02-23 MED ORDER — CHLORHEXIDINE GLUCONATE 0.12 % MT SOLN
OROMUCOSAL | Status: AC
  Administered 2020-02-23: 15 mL via OROMUCOSAL
  Filled 2020-02-23: qty 15

## 2020-02-23 MED ORDER — 0.9 % SODIUM CHLORIDE (POUR BTL) OPTIME
TOPICAL | Status: DC | PRN
  Administered 2020-02-23: 1000 mL

## 2020-02-23 MED ORDER — CHLORHEXIDINE GLUCONATE CLOTH 2 % EX PADS: 6.0000 | MEDICATED_PAD | Freq: Once | CUTANEOUS | Status: DC

## 2020-02-23 MED ORDER — PROPOFOL 500 MG/50ML IV EMUL
INTRAVENOUS | Status: DC | PRN
  Administered 2020-02-23: 50 ug/kg/min via INTRAVENOUS

## 2020-02-23 MED ORDER — FENTANYL CITRATE (PF) 100 MCG/2ML IJ SOLN: 25.0000 ug | INTRAMUSCULAR | Status: DC | PRN

## 2020-02-23 MED ORDER — CEFAZOLIN SODIUM-DEXTROSE 2-4 GM/100ML-% IV SOLN
INTRAVENOUS | Status: AC
  Filled 2020-02-23: qty 100

## 2020-02-23 MED ORDER — FENTANYL CITRATE (PF) 100 MCG/2ML IJ SOLN
INTRAMUSCULAR | Status: DC | PRN
  Administered 2020-02-23 (×2): 25 ug via INTRAVENOUS

## 2020-02-23 MED ORDER — ACETAMINOPHEN 500 MG PO TABS
ORAL_TABLET | ORAL | Status: AC
  Administered 2020-02-23: 1000 mg via ORAL
  Filled 2020-02-23: qty 2

## 2020-02-23 MED ORDER — LIDOCAINE-EPINEPHRINE 1 %-1:100000 IJ SOLN
INTRAMUSCULAR | Status: AC
  Filled 2020-02-23: qty 1

## 2020-02-23 MED ORDER — PROPOFOL 10 MG/ML IV BOLUS
INTRAVENOUS | Status: AC
  Filled 2020-02-23: qty 20

## 2020-02-23 MED ORDER — ONDANSETRON HCL 4 MG/2ML IJ SOLN
INTRAMUSCULAR | Status: DC | PRN
  Administered 2020-02-23: 4 mg via INTRAVENOUS

## 2020-02-23 MED ORDER — ONDANSETRON HCL 4 MG/2ML IJ SOLN: 4.0000 mg | Freq: Once | INTRAMUSCULAR | Status: DC | PRN

## 2020-02-23 MED ORDER — CEFAZOLIN SODIUM-DEXTROSE 2-4 GM/100ML-% IV SOLN
2.0000 g | INTRAVENOUS | Status: AC
  Administered 2020-02-23: 2 g via INTRAVENOUS

## 2020-02-23 MED ORDER — DEXAMETHASONE SODIUM PHOSPHATE 10 MG/ML IJ SOLN
INTRAMUSCULAR | Status: DC | PRN
  Administered 2020-02-23: 10 mg via INTRAVENOUS

## 2020-02-23 SURGICAL SUPPLY — 30 items
ADH SKN CLS APL DERMABOND .7 (GAUZE/BANDAGES/DRESSINGS) ×1
CHLORAPREP W/TINT 10.5 ML (MISCELLANEOUS) ×3 IMPLANT
COVER SURGICAL LIGHT HANDLE (MISCELLANEOUS) ×3 IMPLANT
COVER WAND RF STERILE (DRAPES) ×3 IMPLANT
DECANTER SPIKE VIAL GLASS SM (MISCELLANEOUS) ×6 IMPLANT
DERMABOND ADVANCED (GAUZE/BANDAGES/DRESSINGS) ×2
DERMABOND ADVANCED .7 DNX12 (GAUZE/BANDAGES/DRESSINGS) ×1 IMPLANT
DRAPE LAPAROTOMY 100X72 PEDS (DRAPES) ×3 IMPLANT
ELECT CAUTERY BLADE 6.4 (BLADE) ×3 IMPLANT
ELECT REM PT RETURN 9FT ADLT (ELECTROSURGICAL) ×3
ELECTRODE REM PT RTRN 9FT ADLT (ELECTROSURGICAL) ×1 IMPLANT
GAUZE 4X4 16PLY RFD (DISPOSABLE) ×3 IMPLANT
GLOVE BIO SURGEON STRL SZ 6 (GLOVE) ×3 IMPLANT
GLOVE INDICATOR 6.5 STRL GRN (GLOVE) ×3 IMPLANT
GOWN STRL REUS W/ TWL LRG LVL3 (GOWN DISPOSABLE) ×1 IMPLANT
GOWN STRL REUS W/TWL 2XL LVL3 (GOWN DISPOSABLE) ×3 IMPLANT
GOWN STRL REUS W/TWL LRG LVL3 (GOWN DISPOSABLE) ×3
KIT BASIN OR (CUSTOM PROCEDURE TRAY) ×3 IMPLANT
KIT TURNOVER KIT B (KITS) ×3 IMPLANT
NDL HYPO 25GX1X1/2 BEV (NEEDLE) ×1 IMPLANT
NEEDLE HYPO 25GX1X1/2 BEV (NEEDLE) ×3 IMPLANT
NS IRRIG 1000ML POUR BTL (IV SOLUTION) ×3 IMPLANT
PACK GENERAL/GYN (CUSTOM PROCEDURE TRAY) ×3 IMPLANT
PAD ARMBOARD 7.5X6 YLW CONV (MISCELLANEOUS) ×6 IMPLANT
SUT MON AB 4-0 PC3 18 (SUTURE) ×3 IMPLANT
SUT VIC AB 3-0 SH 27 (SUTURE) ×3
SUT VIC AB 3-0 SH 27X BRD (SUTURE) ×1 IMPLANT
SYR CONTROL 10ML LL (SYRINGE) ×3 IMPLANT
TOWEL GREEN STERILE (TOWEL DISPOSABLE) ×3 IMPLANT
TOWEL GREEN STERILE FF (TOWEL DISPOSABLE) ×3 IMPLANT

## 2020-02-23 NOTE — Op Note (Signed)
  PRE-OPERATIVE DIAGNOSIS:  un-needed Port-A-Cath for colon cancer  POST-OPERATIVE DIAGNOSIS:  Same   PROCEDURE:  Procedure(s):  REMOVAL PORT-A-CATH  SURGEON:  Surgeon(s):  Stark Klein, MD  ANESTHESIA:   MAC + local  EBL:   Minimal  SPECIMEN:  None  Complications : none known  Procedure:   Pt was  identified in the holding area and taken to the operating room where she was placed supine on the operating room table.  MAC anesthesia was induced.  The left upper chest was prepped and draped.  The prior incision was anesthetized with local anesthetic.  The incision was opened with a #15 blade.  The subcutaneous tissue was divided with the cautery.  The port was identified and the capsule opened.  The four 2-0 prolene sutures were removed.  The port was then removed and pressure held on the tract.  The catheter appeared intact without evidence of breakage, length was 23.5 cm.  The wound was inspected for hemostasis, which was achieved with cautery.  The wound was closed with 3-0 vicryl deep dermal interrupted sutures and 4-0 Monocryl running subcuticular suture.  The wound was cleaned, dried, and dressed with dermabond.  The patient was awakened from anesthesia and taken to the PACU in stable condition.  Needle, sponge, and instrument counts are correct.

## 2020-02-23 NOTE — Anesthesia Procedure Notes (Signed)
Procedure Name: MAC Date/Time: 02/23/2020 7:41 AM Performed by: Trinna Post., CRNA Pre-anesthesia Checklist: Patient identified, Emergency Drugs available, Suction available, Patient being monitored and Timeout performed Patient Re-evaluated:Patient Re-evaluated prior to induction Oxygen Delivery Method: Simple face mask Preoxygenation: Pre-oxygenation with 100% oxygen Induction Type: IV induction Placement Confirmation: positive ETCO2

## 2020-02-23 NOTE — Transfer of Care (Signed)
Immediate Anesthesia Transfer of Care Note  Patient: Tywaun Hiltner  Procedure(s) Performed: REMOVAL PORT-A-CATH (Left Chest)  Patient Location: PACU  Anesthesia Type:MAC  Level of Consciousness: awake, alert  and oriented  Airway & Oxygen Therapy: Patient Spontanous Breathing and Patient connected to face mask oxygen  Post-op Assessment: Report given to RN and Post -op Vital signs reviewed and stable  Post vital signs: Reviewed and stable  Last Vitals:  Vitals Value Taken Time  BP 112/60 02/23/20 0828  Temp    Pulse 58 02/23/20 0828  Resp 18 02/23/20 0828  SpO2 96 % 02/23/20 0828    Last Pain:  Vitals:   02/23/20 0621  PainSc: 0-No pain      Patients Stated Pain Goal: 3 (91/79/15 0569)  Complications: No complications documented.

## 2020-02-23 NOTE — Anesthesia Preprocedure Evaluation (Addendum)
Anesthesia Evaluation  Patient identified by MRN, date of birth, ID band Patient awake    Reviewed: Allergy & Precautions, NPO status , Patient's Chart, lab work & pertinent test results  Airway Mallampati: II  TM Distance: >3 FB Neck ROM: Full    Dental  (+) Teeth Intact, Dental Advisory Given   Pulmonary neg pulmonary ROS,    Pulmonary exam normal breath sounds clear to auscultation       Cardiovascular hypertension, Pt. on medications Normal cardiovascular exam+ dysrhythmias Atrial Fibrillation  Rhythm:Regular Rate:Normal     Neuro/Psych negative neurological ROS  negative psych ROS   GI/Hepatic negative GI ROS, Neg liver ROS, Colon cancer    Endo/Other  diabetes, Type 2, Oral Hypoglycemic Agents, Insulin Dependent  Renal/GU negative Renal ROS     Musculoskeletal negative musculoskeletal ROS (+)   Abdominal   Peds  Hematology negative hematology ROS (+)   Anesthesia Other Findings Day of surgery medications reviewed with the patient.  Reproductive/Obstetrics                             Anesthesia Physical Anesthesia Plan  ASA: III  Anesthesia Plan: MAC   Post-op Pain Management:    Induction: Intravenous  PONV Risk Score and Plan: 1 and Propofol infusion and Treatment may vary due to age or medical condition  Airway Management Planned: Nasal Cannula and Natural Airway  Additional Equipment:   Intra-op Plan:   Post-operative Plan:   Informed Consent: I have reviewed the patients History and Physical, chart, labs and discussed the procedure including the risks, benefits and alternatives for the proposed anesthesia with the patient or authorized representative who has indicated his/her understanding and acceptance.     Dental advisory given  Plan Discussed with: CRNA and Anesthesiologist  Anesthesia Plan Comments:         Anesthesia Quick Evaluation

## 2020-02-23 NOTE — H&P (Signed)
William Oneill is an 74 y.o. male.   Chief Complaint: colon cancer HPI:  Pt is s/p colectomy and chemo for right sided colon cancer.  He desires port removal.  He is 2 years out without recurrence.    Past Medical History:  Diagnosis Date  . A-fib ([place])    "years ago, for a short period, never came back"  . Anemia   . Cancer ([place])   . Cancer [facility])    colon cancer  . Colon cancer ([place])   . Diabetes mellitus without complication ([place])    type 2  . Diabetes mellitus without complication ([place])   . High cholesterol   . History of kidney stones    passed  . Insomnia   . Pneumonia    x1    Past Surgical History:  Procedure Laterality Date  . COLON SURGERY     laparoscopic ascending colectomy  Dr. Barry Dienes 02-02-18  . COLONOSCOPY    . LAPAROSCOPIC PARTIAL COLECTOMY N/A 02/02/2018   Procedure: LAPAROSCOPIC ASCENDING COLECTOMY;  Surgeon: Stark Klein, MD;  Location: [facility];  Service: General;  Laterality: N/A;  . PORTACATH PLACEMENT Left 03/03/2018   Procedure: INSERTION PORT-A-CATH;  Surgeon: Stark Klein, MD;  Location: [facility];  Service: General;  Laterality: Left;  . TONSILLECTOMY     as a child    Family History  Problem Relation Age of Onset  . CAD Father   . CAD Sister   . CAD Brother   . Heart disease Father   . Heart attack Father   . Heart disease Sister   . Diabetes Sister   . Stroke Mother    Social History:  reports that he has never smoked. He has never used smokeless tobacco. He reports previous alcohol use. He reports that he does not use drugs.  Allergies: No Known Allergies  Medications Prior to Admission  Medication Sig Dispense Refill  . glipiZIDE (GLUCOTROL) 10 MG tablet Take 10 mg by mouth 2 (two) times daily before a meal.    . metFORMIN (GLUCOPHAGE) 1000 MG tablet Take 1 tablet (1,000 mg total) by mouth 2 (two) times daily with a meal. 180 tablet 1  . Multiple Vitamin (MULTIVITAMIN WITH MINERALS) TABS tablet Take 1 tablet by  mouth daily.    . sitaGLIPtin (JANUVIA) 100 MG tablet Take 1 tablet (100 mg total) by mouth daily. 90 tablet 1  . traZODone (DESYREL) 100 MG tablet Take 0.5-1 tablets (50-100 mg total) by mouth at bedtime. (Patient taking differently: Take 100 mg by mouth at bedtime as needed for sleep. ) 90 tablet 1  . acetaminophen (TYLENOL) 325 MG tablet Take 2 tablets (650 mg total) by mouth every 6 (six) hours as needed for mild pain (or Fever >/= 101). (Patient not taking: Reported on 02/10/2020)    . atorvastatin (LIPITOR) 10 MG tablet Take 1 tablet (10 mg total) by mouth daily. (Patient not taking: Reported on 02/10/2020) 90 tablet 2  . blood glucose meter kit and supplies Dispense based on patient and insurance preference. Use up to four times daily as directed. (FOR ICD-9 250.00, 250.01). 1 each 0  . cephALEXin (KEFLEX) 500 MG capsule Take 1 capsule (500 mg total) by mouth 2 (two) times daily. (Patient not taking: Reported on 02/10/2020) 14 capsule 0  . glucose blood (ONE TOUCH ULTRA TEST) test strip USE AS DIRECTED TO TEST UP TO 4 TIMES A DAY 100 each 6  . HYDROcodone-acetaminophen (NORCO/VICODIN) 5-325 MG tablet Take 1-2  tablets by mouth every 6 (six) hours as needed. (Patient not taking: Reported on 02/10/2020) 6 tablet 0  . hydrOXYzine (ATARAX/VISTARIL) 25 MG tablet Take 0.5-1 tablets (12.5-25 mg total) by mouth at bedtime as needed (sleep). (Patient not taking: Reported on 02/10/2020) 30 tablet 0  . Insulin Glargine (LANTUS SOLOSTAR) 100 UNIT/ML Solostar Pen Inject 7 Units into the skin daily. (Patient not taking: Reported on 02/10/2020) 1 pen 3  . Insulin Pen Needle (PEN NEEDLES) 32G X 4 MM MISC 1 application by Does not apply route daily. 90 each 3  . Lancets (ACCU-CHEK SOFT TOUCH) lancets Use as instructed 100 each 12  . lisinopril (ZESTRIL) 5 MG tablet Take 0.5 tablets (2.5 mg total) by mouth daily. (Patient not taking: Reported on 02/10/2020) 45 tablet 1    Results for orders placed or performed during the  hospital encounter of 02/23/20 (from the past 48 hour(s))  Glucose, capillary     Status: Abnormal   Collection Time: 02/23/20  6:02 AM  Result Value Ref Range   Glucose-Capillary 146 (H) 70 - 99 mg/dL    Comment: Glucose reference range applies only to samples taken after fasting for at least 8 hours.   No results found.  Review of Systems  All other systems reviewed and are negative.   Blood pressure (!) 148/74, pulse (!) 58, temperature 97.9 F (36.6 C), resp. rate 20, height '5\' 6"'$  (1.676 m), weight 77.1 kg, SpO2 99 %. Physical Exam Vitals reviewed.  Constitutional:      Appearance: Normal appearance.  Eyes:     General: No scleral icterus.    Pupils: Pupils are equal, round, and reactive to light.  Cardiovascular:     Rate and Rhythm: Normal rate and regular rhythm.     Pulses: Normal pulses.  Pulmonary:     Effort: Pulmonary effort is normal.     Comments: Left sided port Abdominal:     General: Abdomen is flat.  Musculoskeletal:        General: No swelling.     Cervical back: Normal range of motion and neck supple.  Skin:    General: Skin is warm and dry.     Capillary Refill: Capillary refill takes less than 2 seconds.  Neurological:     General: No focal deficit present.     Mental Status: He is alert and oriented to person, place, and time.  Psychiatric:        Mood and Affect: Mood normal.        Behavior: Behavior normal.        Thought Content: Thought content normal.        Judgment: Judgment normal.      Assessment/Plan Colon cancer S/p chemo  Plan port removal. Reviewed risks.    Stark Klein, MD 02/23/2020, 7:29 AM

## 2020-02-23 NOTE — Anesthesia Postprocedure Evaluation (Signed)
Anesthesia Post Note  Patient: William Oneill  Procedure(s) Performed: REMOVAL PORT-A-CATH (Left Chest)     Patient location during evaluation: PACU Anesthesia Type: MAC Level of consciousness: awake and alert Pain management: pain level controlled Vital Signs Assessment: post-procedure vital signs reviewed and stable Respiratory status: spontaneous breathing, nonlabored ventilation, respiratory function stable and patient connected to nasal cannula oxygen Cardiovascular status: stable and blood pressure returned to baseline Postop Assessment: no apparent nausea or vomiting Anesthetic complications: no   No complications documented.  Last Vitals:  Vitals:   02/23/20 0850 02/23/20 0853  BP:  125/68  Pulse: (!) 54 (!) 55  Resp: 14 13  Temp:  36.6 C  SpO2: 99% 98%    Last Pain:  Vitals:   02/23/20 0853  PainSc: 0-No pain                 Catalina Gravel

## 2020-02-24 ENCOUNTER — Encounter (HOSPITAL_COMMUNITY): Payer: Self-pay | Admitting: General Surgery

## 2020-03-08 ENCOUNTER — Ambulatory Visit: Payer: Medicare Other | Admitting: Family Medicine

## 2020-03-14 ENCOUNTER — Other Ambulatory Visit: Payer: Self-pay

## 2020-03-14 ENCOUNTER — Ambulatory Visit (INDEPENDENT_AMBULATORY_CARE_PROVIDER_SITE_OTHER): Payer: Medicare Other | Admitting: Family Medicine

## 2020-03-14 ENCOUNTER — Encounter: Payer: Self-pay | Admitting: Family Medicine

## 2020-03-14 VITALS — BP 126/70 | HR 64 | Temp 98.3°F | Ht 66.0 in | Wt 170.0 lb

## 2020-03-14 DIAGNOSIS — Z794 Long term (current) use of insulin: Secondary | ICD-10-CM | POA: Diagnosis not present

## 2020-03-14 DIAGNOSIS — E1165 Type 2 diabetes mellitus with hyperglycemia: Secondary | ICD-10-CM

## 2020-03-14 DIAGNOSIS — G4700 Insomnia, unspecified: Secondary | ICD-10-CM

## 2020-03-14 DIAGNOSIS — R319 Hematuria, unspecified: Secondary | ICD-10-CM

## 2020-03-14 LAB — POC MICROSCOPIC URINALYSIS (UMFC): Mucus: ABSENT

## 2020-03-14 LAB — POCT URINALYSIS DIP (MANUAL ENTRY)
Bilirubin, UA: NEGATIVE
Glucose, UA: 250 mg/dL — AB
Ketones, POC UA: NEGATIVE mg/dL
Nitrite, UA: POSITIVE — AB
Protein Ur, POC: NEGATIVE mg/dL
Spec Grav, UA: 1.025 (ref 1.010–1.025)
Urobilinogen, UA: 4 E.U./dL — AB
pH, UA: 6.5 (ref 5.0–8.0)

## 2020-03-14 MED ORDER — ATORVASTATIN CALCIUM 10 MG PO TABS
10.0000 mg | ORAL_TABLET | Freq: Every day | ORAL | 1 refills | Status: DC
Start: 2020-03-14 — End: 2020-07-05

## 2020-03-14 MED ORDER — LISINOPRIL 5 MG PO TABS: 2.5000 mg | ORAL_TABLET | Freq: Every day | ORAL | 1 refills | Status: DC

## 2020-03-14 MED ORDER — LANTUS SOLOSTAR 100 UNIT/ML ~~LOC~~ SOPN: 7.0000 [IU] | PEN_INJECTOR | Freq: Every day | SUBCUTANEOUS | 2 refills | Status: DC

## 2020-03-14 MED ORDER — TRAZODONE HCL 100 MG PO TABS: 50.0000 mg | ORAL_TABLET | Freq: Every day | ORAL | 1 refills | Status: DC

## 2020-03-14 MED ORDER — SITAGLIPTIN PHOSPHATE 100 MG PO TABS: 100.0000 mg | ORAL_TABLET | Freq: Every day | ORAL | 1 refills | Status: DC

## 2020-03-14 MED ORDER — METFORMIN HCL 1000 MG PO TABS: 1000.0000 mg | ORAL_TABLET | Freq: Two times a day (BID) | ORAL | 1 refills | Status: DC

## 2020-03-14 NOTE — Patient Instructions (Addendum)
Continue Lantus 7 units every day as well as your Metformin twice per day and Januvia once per day.  If you have any low blood sugar readings or you are concerned that those readings are going lower, let me know and we can adjust your insulin if needed.   Restart lisinopril 2.5mg  per day - helps protect kidneys.  If any new side effects with that medication including lightheadedness, dizziness or low blood pressure stop it right away and let me know.   Continue atorvastatin for cholesterol and trazodone for sleep.  Follow-up in 3 months but let me know if there are questions in the meantime.  If any medications on your list today are not what you take at home or there are additional medications, please let us know.      If you have lab work done today you will be contacted with your lab results within the next 2 weeks.  If you have not heard from [place] then please contact us. The fastest way to get your results is to register for My Chart.   IF you received an x-ray today, you will receive an invoice from [facility]. Please contact [facility] at [phone] with questions or concerns regarding your invoice.   IF you received labwork today, you will receive an invoice from [place]. Please contact [facility] at [phone] with questions or concerns regarding your invoice.   Our billing staff will not be able to assist you with questions regarding bills from these companies.  You will be contacted with the lab results as soon as they are available. The fastest way to get your results is to activate your My Chart account. Instructions are located on the last page of this paperwork. If you have not heard from [place] regarding the results in 2 weeks, please contact this office.
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
## 2020-03-15 LAB — COMPREHENSIVE METABOLIC PANEL
ALT: 15 IU/L (ref 0–44)
AST: 16 IU/L (ref 0–40)
Albumin/Globulin Ratio: 1.8 (ref 1.2–2.2)
Albumin: 4.4 g/dL (ref 3.7–4.7)
Alkaline Phosphatase: 106 IU/L (ref 48–121)
BUN/Creatinine Ratio: 28 — ABNORMAL HIGH (ref 10–24)
BUN: 22 mg/dL (ref 8–27)
Bilirubin Total: 1.6 mg/dL — ABNORMAL HIGH (ref 0.0–1.2)
CO2: 26 mmol/L (ref 20–29)
Calcium: 9.2 mg/dL (ref 8.6–10.2)
Chloride: 101 mmol/L (ref 96–106)
Creatinine, Ser: 0.79 mg/dL (ref 0.76–1.27)
GFR calc Af Amer: 104 mL/min/{1.73_m2} (ref >59)
GFR calc non Af Amer: 90 mL/min/{1.73_m2} (ref >59)
Globulin, Total: 2.5 g/dL (ref 1.5–4.5)
Glucose: 160 mg/dL — ABNORMAL HIGH (ref 65–99)
Potassium: 4.2 mmol/L (ref 3.5–5.2)
Sodium: 139 mmol/L (ref 134–144)
Total Protein: 6.9 g/dL (ref 6.0–8.5)

## 2020-03-15 LAB — LIPID PANEL
Chol/HDL Ratio: 3 ratio (ref 0.0–5.0)
Cholesterol, Total: 177 mg/dL (ref 100–199)
HDL: 59 mg/dL (ref >39)
LDL Chol Calc (NIH): 93 mg/dL (ref 0–99)
Triglycerides: 145 mg/dL (ref 0–149)
VLDL Cholesterol Cal: 25 mg/dL (ref 5–40)
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
## 2020-04-23 ENCOUNTER — Inpatient Hospital Stay: Payer: Medicare Other | Attending: Nurse Practitioner | Admitting: Nurse Practitioner

## 2020-04-23 ENCOUNTER — Other Ambulatory Visit: Payer: Self-pay

## 2020-04-23 ENCOUNTER — Inpatient Hospital Stay: Payer: Medicare Other

## 2020-04-23 ENCOUNTER — Encounter: Payer: Self-pay | Admitting: Nurse Practitioner

## 2020-04-23 ENCOUNTER — Telehealth: Payer: Self-pay | Admitting: Nurse Practitioner

## 2020-04-23 VITALS — BP 158/69 | HR 61 | Temp 98.2°F | Resp 20 | Ht 66.0 in | Wt 175.4 lb

## 2020-04-23 DIAGNOSIS — K8020 Calculus of gallbladder without cholecystitis without obstruction: Secondary | ICD-10-CM | POA: Diagnosis not present

## 2020-04-23 DIAGNOSIS — I700 Atherosclerosis of aorta: Secondary | ICD-10-CM | POA: Diagnosis not present

## 2020-04-23 DIAGNOSIS — C180 Malignant neoplasm of cecum: Secondary | ICD-10-CM | POA: Diagnosis not present

## 2020-04-23 DIAGNOSIS — Z79899 Other long term (current) drug therapy: Secondary | ICD-10-CM | POA: Insufficient documentation

## 2020-04-23 DIAGNOSIS — K769 Liver disease, unspecified: Secondary | ICD-10-CM | POA: Insufficient documentation

## 2020-04-23 DIAGNOSIS — G4700 Insomnia, unspecified: Secondary | ICD-10-CM | POA: Insufficient documentation

## 2020-04-23 DIAGNOSIS — I4891 Unspecified atrial fibrillation: Secondary | ICD-10-CM | POA: Diagnosis not present

## 2020-04-23 DIAGNOSIS — Z9221 Personal history of antineoplastic chemotherapy: Secondary | ICD-10-CM | POA: Diagnosis not present

## 2020-04-23 DIAGNOSIS — E7800 Pure hypercholesterolemia, unspecified: Secondary | ICD-10-CM | POA: Insufficient documentation

## 2020-04-23 DIAGNOSIS — E1165 Type 2 diabetes mellitus with hyperglycemia: Secondary | ICD-10-CM | POA: Insufficient documentation

## 2020-04-23 DIAGNOSIS — Z794 Long term (current) use of insulin: Secondary | ICD-10-CM | POA: Diagnosis not present

## 2020-04-23 DIAGNOSIS — I2510 Atherosclerotic heart disease of native coronary artery without angina pectoris: Secondary | ICD-10-CM | POA: Diagnosis not present

## 2020-04-23 DIAGNOSIS — Z08 Encounter for follow-up examination after completed treatment for malignant neoplasm: Secondary | ICD-10-CM | POA: Diagnosis not present

## 2020-04-23 DIAGNOSIS — Z9049 Acquired absence of other specified parts of digestive tract: Secondary | ICD-10-CM | POA: Diagnosis present

## 2020-04-23 LAB — CMP (CANCER CENTER ONLY)
ALT: 20 U/L (ref 0–44)
AST: 18 U/L (ref 15–41)
Albumin: 3.7 g/dL (ref 3.5–5.0)
Alkaline Phosphatase: 97 U/L (ref 38–126)
Anion gap: 5 (ref 5–15)
BUN: 18 mg/dL (ref 8–23)
CO2: 26 mmol/L (ref 22–32)
Calcium: 8.8 mg/dL — ABNORMAL LOW (ref 8.9–10.3)
Chloride: 106 mmol/L (ref 98–111)
Creatinine: 0.85 mg/dL (ref 0.61–1.24)
GFR, Est AFR Am: >60 mL/min (ref >60)
GFR, Estimated: >60 mL/min (ref >60)
Glucose, Bld: 284 mg/dL — ABNORMAL HIGH (ref 70–99)
Potassium: 4.6 mmol/L (ref 3.5–5.1)
Sodium: 137 mmol/L (ref 135–145)
Total Bilirubin: 1.8 mg/dL — ABNORMAL HIGH (ref 0.3–1.2)
Total Protein: 6.7 g/dL (ref 6.5–8.1)

## 2020-04-23 LAB — CBC WITH DIFFERENTIAL (CANCER CENTER ONLY)
Abs Immature Granulocytes: 0.03 10*3/uL (ref 0.00–0.07)
Basophils Absolute: 0.1 10*3/uL (ref 0.0–0.1)
Basophils Relative: 1 %
Eosinophils Absolute: 0.3 10*3/uL (ref 0.0–0.5)
Eosinophils Relative: 5 %
HCT: 41.2 % (ref 39.0–52.0)
Hemoglobin: 14.3 g/dL (ref 13.0–17.0)
Immature Granulocytes: 1 %
Lymphocytes Relative: 22 %
Lymphs Abs: 1.4 10*3/uL (ref 0.7–4.0)
MCH: 31.2 pg (ref 26.0–34.0)
MCHC: 34.7 g/dL (ref 30.0–36.0)
MCV: 90 fL (ref 80.0–100.0)
Monocytes Absolute: 0.6 10*3/uL (ref 0.1–1.0)
Monocytes Relative: 10 %
Neutro Abs: 3.7 10*3/uL (ref 1.7–7.7)
Neutrophils Relative %: 61 %
Platelet Count: 207 10*3/uL (ref 150–400)
RBC: 4.58 MIL/uL (ref 4.22–5.81)
RDW: 12.4 % (ref 11.5–15.5)
WBC Count: 6.1 10*3/uL (ref 4.0–10.5)
nRBC: 0 % (ref 0.0–0.2)

## 2020-04-23 LAB — CEA (IN HOUSE-CHCC): CEA (CHCC-In House): 1.16 ng/mL (ref 0.00–5.00)

## 2020-04-23 NOTE — Telephone Encounter (Signed)
Scheduled per 9/20 los. Printed avs and calendar for pt.  

## 2020-05-03 ENCOUNTER — Encounter: Payer: Self-pay | Admitting: Family Medicine

## 2020-05-10 ENCOUNTER — Telehealth: Payer: Self-pay

## 2020-05-10 ENCOUNTER — Other Ambulatory Visit: Payer: Self-pay

## 2020-05-10 ENCOUNTER — Ambulatory Visit (INDEPENDENT_AMBULATORY_CARE_PROVIDER_SITE_OTHER): Payer: Medicare Other | Admitting: Family Medicine

## 2020-05-10 DIAGNOSIS — Z23 Encounter for immunization: Secondary | ICD-10-CM

## 2020-05-10 NOTE — Telephone Encounter (Signed)
Was in office 05/10/2020 for flu shot, asked about possibly changing from trazodone back to Ambien, states he isn't liking the trazodone anymore as it takes him a very long time to fall asleep some nights and the Ambien he didn't have the same problems.  Pt is aware you do not return until 11th and was okay with this.

## 2020-05-10 NOTE — Patient Instructions (Signed)
° ° ° °  If you have lab work done today you will be contacted with your lab results within the next 2 weeks.  If you have not heard from us then please contact us. The fastest way to get your results is to register for My Chart. ° ° °IF you received an x-ray today, you will receive an invoice from [facility]. Please contact [facility] at [phone] with questions or concerns regarding your invoice.  ° °IF you received labwork today, you will receive an invoice from [facility]. Please contact [facility] at [phone] with questions or concerns regarding your invoice.  ° °Our billing staff will not be able to assist you with questions regarding bills from these companies. ° °You will be contacted with the lab results as soon as they are available. The fastest way to get your results is to activate your My Chart account. Instructions are located on the last page of this paperwork. If you have not heard from us regarding the results in 2 weeks, please contact this office. °  ° ° ° °

## 2020-05-11 MED ORDER — ZOLPIDEM TARTRATE 5 MG PO TABS
5.0000 mg | ORAL_TABLET | Freq: Every evening | ORAL | 0 refills | Status: DC | PRN
Start: 2020-05-11 — End: 2020-07-05

## 2020-05-11 NOTE — Telephone Encounter (Signed)
Controlled substance database (PDMP) reviewed. No concerns appreciated.  Okay to temporarily stop trazodone, and I have sent in some Ambien for now but would like to discuss treatment of sleep further at office visit. I do see he has an appointment on November 10 and can discuss medication at that time. Let me know if there are questions prior to that appointment.

## 2020-05-11 NOTE — Telephone Encounter (Signed)
Called patient and let him know that Ambien was sent to the pharmacy.

## 2020-06-13 ENCOUNTER — Ambulatory Visit: Payer: Medicare Other | Admitting: Family Medicine

## 2020-07-05 ENCOUNTER — Ambulatory Visit (INDEPENDENT_AMBULATORY_CARE_PROVIDER_SITE_OTHER): Payer: Medicare Other | Admitting: Family Medicine

## 2020-07-05 ENCOUNTER — Encounter: Payer: Self-pay | Admitting: Family Medicine

## 2020-07-05 ENCOUNTER — Other Ambulatory Visit: Payer: Self-pay

## 2020-07-05 VITALS — BP 137/61 | HR 60 | Temp 98.4°F | Ht 66.0 in | Wt 173.0 lb

## 2020-07-05 DIAGNOSIS — Z7185 Encounter for immunization safety counseling: Secondary | ICD-10-CM

## 2020-07-05 DIAGNOSIS — Z794 Long term (current) use of insulin: Secondary | ICD-10-CM

## 2020-07-05 DIAGNOSIS — G4700 Insomnia, unspecified: Secondary | ICD-10-CM

## 2020-07-05 DIAGNOSIS — E1165 Type 2 diabetes mellitus with hyperglycemia: Secondary | ICD-10-CM | POA: Diagnosis not present

## 2020-07-05 MED ORDER — TRAZODONE HCL 100 MG PO TABS: 100.0000 mg | ORAL_TABLET | Freq: Every day | ORAL | 1 refills | Status: DC

## 2020-07-05 MED ORDER — SITAGLIPTIN PHOSPHATE 100 MG PO TABS: 100.0000 mg | ORAL_TABLET | Freq: Every day | ORAL | 1 refills | Status: DC

## 2020-07-05 MED ORDER — METFORMIN HCL 1000 MG PO TABS: 1000.0000 mg | ORAL_TABLET | Freq: Two times a day (BID) | ORAL | 1 refills | Status: DC

## 2020-07-05 MED ORDER — ATORVASTATIN CALCIUM 10 MG PO TABS: 10.0000 mg | ORAL_TABLET | Freq: Every day | ORAL | 1 refills | Status: DC

## 2020-07-05 MED ORDER — LISINOPRIL 5 MG PO TABS: 2.5000 mg | ORAL_TABLET | Freq: Every day | ORAL | 1 refills | Status: DC

## 2020-07-05 MED ORDER — LANTUS SOLOSTAR 100 UNIT/ML ~~LOC~~ SOPN: 7.0000 [IU] | PEN_INJECTOR | Freq: Every day | SUBCUTANEOUS | 2 refills | Status: DC
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
## 2020-07-05 NOTE — Patient Instructions (Addendum)
Try restarting trazodone, but take full pill each night. If that does not help sleep let me know and we can go back to [place] if needed.   Continue same diabetes medicines for now.  Great work on watching diet.  I will let you know if any concerns on your blood work or urine test from today.  Follow-up with me in 3 months but please let me know if there are questions sooner.  Take care.    If you have lab work done today you will be contacted with your lab results within the next 2 weeks.  If you have not heard from [place] then please contact us. The fastest way to get your results is to register for My Chart.   IF you received an x-ray today, you will receive an invoice from [facility]. Please contact [facility] at [phone] with questions or concerns regarding your invoice.   IF you received labwork today, you will receive an invoice from [place]. Please contact [facility] at [phone] with questions or concerns regarding your invoice.   Our billing staff will not be able to assist you with questions regarding bills from these companies.  You will be contacted with the lab results as soon as they are available. The fastest way to get your results is to activate your My Chart account. Instructions are located on the last page of this paperwork. If you have not heard from [place] regarding the results in 2 weeks, please contact this office.

## 2020-07-06 LAB — HEMOGLOBIN A1C
Est. average glucose Bld gHb Est-mCnc: 189 mg/dL
Hgb A1c MFr Bld: 8.2 % — ABNORMAL HIGH (ref 4.8–5.6)

## 2020-07-06 LAB — MICROALBUMIN / CREATININE URINE RATIO
Creatinine, Urine: 105.5 mg/dL
Microalb/Creat Ratio: 41 mg/g creat — ABNORMAL HIGH (ref 0–29)
Microalbumin, Urine: 43.5 ug/mL
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
## 2020-07-16 ENCOUNTER — Telehealth (INDEPENDENT_AMBULATORY_CARE_PROVIDER_SITE_OTHER): Payer: Medicare Other | Admitting: Registered Nurse

## 2020-07-16 ENCOUNTER — Encounter: Payer: Self-pay | Admitting: Registered Nurse

## 2020-07-16 ENCOUNTER — Other Ambulatory Visit: Payer: Self-pay

## 2020-07-16 DIAGNOSIS — R059 Cough, unspecified: Secondary | ICD-10-CM

## 2020-07-16 DIAGNOSIS — J069 Acute upper respiratory infection, unspecified: Secondary | ICD-10-CM

## 2020-07-16 MED ORDER — PREDNISONE 10 MG PO TABS: 30.0000 mg | ORAL_TABLET | Freq: Every day | ORAL | 0 refills | Status: DC

## 2020-07-16 MED ORDER — AZELASTINE HCL 0.1 % NA SOLN
1.0000 | Freq: Two times a day (BID) | NASAL | 2 refills | Status: DC
Start: 2020-07-16 — End: 2020-10-11

## 2020-07-16 MED ORDER — GUAIFENESIN-DM 100-10 MG/5ML PO SYRP
5.0000 mL | ORAL_SOLUTION | ORAL | 0 refills | Status: DC | PRN
Start: 2020-07-16 — End: 2020-11-09

## 2020-07-16 NOTE — Patient Instructions (Signed)
° ° ° °  If you have lab work done today you will be contacted with your lab results within the next 2 weeks.  If you have not heard from us then please contact us. The fastest way to get your results is to register for My Chart. ° ° °IF you received an x-ray today, you will receive an invoice from [facility]. Please contact [facility] at [phone] with questions or concerns regarding your invoice.  ° °IF you received labwork today, you will receive an invoice from [facility]. Please contact [facility] at [phone] with questions or concerns regarding your invoice.  ° °Our billing staff will not be able to assist you with questions regarding bills from these companies. ° °You will be contacted with the lab results as soon as they are available. The fastest way to get your results is to activate your My Chart account. Instructions are located on the last page of this paperwork. If you have not heard from us regarding the results in 2 weeks, please contact this office. °  ° ° ° °
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
## 2020-07-19 ENCOUNTER — Ambulatory Visit (INDEPENDENT_AMBULATORY_CARE_PROVIDER_SITE_OTHER): Payer: Medicare Other

## 2020-07-19 ENCOUNTER — Ambulatory Visit (INDEPENDENT_AMBULATORY_CARE_PROVIDER_SITE_OTHER): Payer: Medicare Other | Admitting: Family Medicine

## 2020-07-19 ENCOUNTER — Other Ambulatory Visit: Payer: Self-pay

## 2020-07-19 ENCOUNTER — Encounter: Payer: Self-pay | Admitting: Family Medicine

## 2020-07-19 VITALS — BP 139/68 | HR 75 | Temp 98.7°F | Ht 66.0 in | Wt 169.0 lb

## 2020-07-19 DIAGNOSIS — R059 Cough, unspecified: Secondary | ICD-10-CM

## 2020-07-19 DIAGNOSIS — J3489 Other specified disorders of nose and nasal sinuses: Secondary | ICD-10-CM

## 2020-07-19 DIAGNOSIS — R067 Sneezing: Secondary | ICD-10-CM

## 2020-07-19 LAB — COVID-19, FLU A+B AND RSV
Influenza A, NAA: NOT DETECTED
Influenza B, NAA: NOT DETECTED
RSV, NAA: NOT DETECTED
SARS-CoV-2, NAA: NOT DETECTED

## 2020-07-19 MED ORDER — BENZONATATE 100 MG PO CAPS: 100.0000 mg | ORAL_CAPSULE | Freq: Three times a day (TID) | ORAL | 0 refills | Status: DC | PRN

## 2020-07-19 MED ORDER — FLUTICASONE PROPIONATE 50 MCG/ACT NA SUSP: 2.0000 | Freq: Every day | NASAL | 1 refills | Status: DC
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
## 2020-07-19 NOTE — Patient Instructions (Addendum)
Try claritin over the counter once per day, and flonase as may have some allergies as cause. Tessalon perles for cough if needed. Continue to drink fluids,  If fever, shortness of breath or worsening - be seen here, ER, or urgent care.    Return to the clinic or go to the nearest emergency room if any of your symptoms worsen or new symptoms occur.   Cough, Adult A cough helps to clear your throat and lungs. A cough may be a sign of an illness or another medical condition. An acute cough may only last 2-3 weeks, while a chronic cough may last 8 or more weeks. Many things can cause a cough. They include:  Germs (viruses or bacteria) that attack the airway.  Breathing in things that bother (irritate) your lungs.  Allergies.  Asthma.  Mucus that runs down the back of your throat (postnasal drip).  Smoking.  Acid backing up from the stomach into the tube that moves food from the mouth to the stomach (gastroesophageal reflux).  Some medicines.  Lung problems.  Other medical conditions, such as heart failure or a blood clot in the lung (pulmonary embolism). Follow these instructions at home: Medicines  Take over-the-counter and prescription medicines only as told by your doctor.  Talk with your doctor before you take medicines that stop a cough (coughsuppressants). Lifestyle   Do not smoke, and try not to be around smoke. Do not use any products that contain nicotine or tobacco, such as cigarettes, e-cigarettes, and chewing tobacco. If you need help quitting, ask your doctor.  Drink enough fluid to keep your pee (urine) pale yellow.  Avoid caffeine.  Do not drink alcohol if your doctor tells you not to drink. General instructions   Watch for any changes in your cough. Tell your doctor about them.  Always cover your mouth when you cough.  Stay away from things that make you cough, such as perfume, candles, campfire smoke, or cleaning products.  If the air is dry,  use a cool mist vaporizer or humidifier in your home.  If your cough is worse at night, try using extra pillows to raise your head up higher while you sleep.  Rest as needed.  Keep all follow-up visits as told by your doctor. This is important. Contact a doctor if:  You have new symptoms.  You cough up pus.  Your cough does not get better after 2-3 weeks, or your cough gets worse.  Cough medicine does not help your cough and you are not sleeping well.  You have pain that gets worse or pain that is not helped with medicine.  You have a fever.  You are losing weight and you do not know why.  You have night sweats. Get help right away if:  You cough up blood.  You have trouble breathing.  Your heartbeat is very fast. These symptoms may be an emergency. Do not wait to see if the symptoms will go away. Get medical help right away. Call your local emergency services (911 in the [place] Do not drive yourself to the hospital. Summary  A cough helps to clear your throat and lungs. Many things can cause a cough.  Take over-the-counter and prescription medicines only as told by your doctor.  Always cover your mouth when you cough.  Contact a doctor if you have new symptoms or you have a cough that does not get better or gets worse. This information is not intended to replace advice given  to you by your health care provider. Make sure you discuss any questions you have with your health care provider. Document Revised: 08/09/2018 Document Reviewed: 08/09/2018 Elsevier Patient Education  [employer].   If you have lab work done today you will be contacted with your lab results within the next 2 weeks.  If you have not heard from [place] then please contact us. The fastest way to get your results is to register for My Chart.   IF you received an x-ray today, you will receive an invoice from [facility]. Please contact [facility] at [phone] with questions or  concerns regarding your invoice.   IF you received labwork today, you will receive an invoice from [facility]. Please contact [facility] at [phone] with questions or concerns regarding your invoice.   Our billing staff will not be able to assist you with questions regarding bills from these companies.  You will be contacted with the lab results as soon as they are available. The fastest way to get your results is to activate your My Chart account. Instructions are located on the last page of this paperwork. If you have not heard from [place] regarding the results in 2 weeks, please contact this office.

## 2020-07-20 NOTE — Progress Notes (Signed)
Please call patient -   Negative for rsv, covid, and flu  Thanks,  Denice Paradise
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
## 2020-08-23 ENCOUNTER — Inpatient Hospital Stay: Payer: Medicare Other | Attending: Hematology | Admitting: Hematology

## 2020-08-23 ENCOUNTER — Other Ambulatory Visit: Payer: Self-pay

## 2020-08-23 ENCOUNTER — Encounter: Payer: Self-pay | Admitting: Hematology

## 2020-08-23 ENCOUNTER — Inpatient Hospital Stay: Payer: Medicare Other

## 2020-08-23 VITALS — BP 130/71 | HR 55 | Temp 97.3°F | Resp 16 | Ht 66.0 in | Wt 176.2 lb

## 2020-08-23 DIAGNOSIS — C180 Malignant neoplasm of cecum: Secondary | ICD-10-CM | POA: Diagnosis not present

## 2020-08-23 DIAGNOSIS — I4891 Unspecified atrial fibrillation: Secondary | ICD-10-CM | POA: Diagnosis not present

## 2020-08-23 DIAGNOSIS — Z7984 Long term (current) use of oral hypoglycemic drugs: Secondary | ICD-10-CM | POA: Insufficient documentation

## 2020-08-23 DIAGNOSIS — Z9221 Personal history of antineoplastic chemotherapy: Secondary | ICD-10-CM | POA: Insufficient documentation

## 2020-08-23 DIAGNOSIS — E1165 Type 2 diabetes mellitus with hyperglycemia: Secondary | ICD-10-CM | POA: Insufficient documentation

## 2020-08-23 DIAGNOSIS — Z79899 Other long term (current) drug therapy: Secondary | ICD-10-CM | POA: Diagnosis not present

## 2020-08-23 DIAGNOSIS — E7800 Pure hypercholesterolemia, unspecified: Secondary | ICD-10-CM | POA: Diagnosis not present

## 2020-08-23 DIAGNOSIS — Z8616 Personal history of COVID-19: Secondary | ICD-10-CM | POA: Diagnosis not present

## 2020-08-23 DIAGNOSIS — Z794 Long term (current) use of insulin: Secondary | ICD-10-CM | POA: Insufficient documentation

## 2020-08-23 DIAGNOSIS — Z9049 Acquired absence of other specified parts of digestive tract: Secondary | ICD-10-CM | POA: Insufficient documentation

## 2020-08-23 LAB — CMP (CANCER CENTER ONLY)
ALT: 16 U/L (ref 0–44)
AST: 15 U/L (ref 15–41)
Albumin: 3.7 g/dL (ref 3.5–5.0)
Alkaline Phosphatase: 99 U/L (ref 38–126)
Anion gap: 7 (ref 5–15)
BUN: 24 mg/dL — ABNORMAL HIGH (ref 8–23)
CO2: 25 mmol/L (ref 22–32)
Calcium: 8.7 mg/dL — ABNORMAL LOW (ref 8.9–10.3)
Chloride: 106 mmol/L (ref 98–111)
Creatinine: 1 mg/dL (ref 0.61–1.24)
GFR, Estimated: >60 mL/min
Glucose, Bld: 274 mg/dL — ABNORMAL HIGH (ref 70–99)
Potassium: 4.6 mmol/L (ref 3.5–5.1)
Sodium: 138 mmol/L (ref 135–145)
Total Bilirubin: 2 mg/dL — ABNORMAL HIGH (ref 0.3–1.2)
Total Protein: 6.2 g/dL — ABNORMAL LOW (ref 6.5–8.1)

## 2020-08-23 LAB — CBC WITH DIFFERENTIAL (CANCER CENTER ONLY)
Abs Immature Granulocytes: 0.04 10*3/uL (ref 0.00–0.07)
Basophils Absolute: 0.1 10*3/uL (ref 0.0–0.1)
Basophils Relative: 1 %
Eosinophils Absolute: 0.2 10*3/uL (ref 0.0–0.5)
Eosinophils Relative: 4 %
HCT: 43.1 % (ref 39.0–52.0)
Hemoglobin: 14.5 g/dL (ref 13.0–17.0)
Immature Granulocytes: 1 %
Lymphocytes Relative: 25 %
Lymphs Abs: 1.5 10*3/uL (ref 0.7–4.0)
MCH: 30.5 pg (ref 26.0–34.0)
MCHC: 33.6 g/dL (ref 30.0–36.0)
MCV: 90.7 fL (ref 80.0–100.0)
Monocytes Absolute: 0.6 10*3/uL (ref 0.1–1.0)
Monocytes Relative: 10 %
Neutro Abs: 3.6 10*3/uL (ref 1.7–7.7)
Neutrophils Relative %: 59 %
Platelet Count: 228 10*3/uL (ref 150–400)
RBC: 4.75 MIL/uL (ref 4.22–5.81)
RDW: 12.4 % (ref 11.5–15.5)
WBC Count: 6 10*3/uL (ref 4.0–10.5)
nRBC: 0 % (ref 0.0–0.2)

## 2020-08-23 LAB — CEA (IN HOUSE-CHCC): CEA (CHCC-In House): 1.28 ng/mL (ref 0.00–5.00)

## 2020-08-24 ENCOUNTER — Telehealth: Payer: Self-pay

## 2020-08-24 NOTE — Telephone Encounter (Signed)
-----   Message from [name], NP sent at 08/23/2020  3:57 PM EST ----- Please let him know tumor marker is normal, no concerns.  Thanks, Regan Rakers, NP

## 2020-08-24 NOTE — Telephone Encounter (Signed)
Message left with pt lab result;  CEA normal encouraged  To follow up as scheduled

## 2020-10-04 ENCOUNTER — Ambulatory Visit: Payer: Medicare Other | Admitting: Family Medicine

## 2020-10-11 ENCOUNTER — Encounter: Payer: Self-pay | Admitting: Family Medicine

## 2020-10-11 ENCOUNTER — Ambulatory Visit (INDEPENDENT_AMBULATORY_CARE_PROVIDER_SITE_OTHER): Payer: Medicare Other | Admitting: Family Medicine

## 2020-10-11 ENCOUNTER — Other Ambulatory Visit: Payer: Self-pay

## 2020-10-11 VITALS — BP 133/77 | HR 68 | Temp 98.5°F | Resp 16 | Ht 66.0 in | Wt 174.0 lb

## 2020-10-11 DIAGNOSIS — Z794 Long term (current) use of insulin: Secondary | ICD-10-CM

## 2020-10-11 DIAGNOSIS — G4700 Insomnia, unspecified: Secondary | ICD-10-CM

## 2020-10-11 DIAGNOSIS — E1165 Type 2 diabetes mellitus with hyperglycemia: Secondary | ICD-10-CM | POA: Diagnosis not present

## 2020-10-11 DIAGNOSIS — R809 Proteinuria, unspecified: Secondary | ICD-10-CM

## 2020-10-11 MED ORDER — SITAGLIPTIN PHOSPHATE 100 MG PO TABS: 100.0000 mg | ORAL_TABLET | Freq: Every day | ORAL | 1 refills | Status: DC

## 2020-10-11 MED ORDER — LANTUS SOLOSTAR 100 UNIT/ML ~~LOC~~ SOPN
8.0000 [IU] | PEN_INJECTOR | Freq: Every day | SUBCUTANEOUS | 2 refills | Status: DC
Start: 2020-10-11 — End: 2020-10-21

## 2020-10-11 MED ORDER — LISINOPRIL 2.5 MG PO TABS
2.5000 mg | ORAL_TABLET | Freq: Every day | ORAL | 1 refills | Status: DC
Start: 2020-10-11 — End: 2020-11-09

## 2020-10-11 MED ORDER — METFORMIN HCL 1000 MG PO TABS: 1000.0000 mg | ORAL_TABLET | Freq: Two times a day (BID) | ORAL | 1 refills | Status: DC

## 2020-10-11 MED ORDER — ATORVASTATIN CALCIUM 10 MG PO TABS: 10.0000 mg | ORAL_TABLET | Freq: Every day | ORAL | 1 refills | Status: DC

## 2020-10-11 MED ORDER — TRAZODONE HCL 100 MG PO TABS: 100.0000 mg | ORAL_TABLET | Freq: Every day | ORAL | 1 refills | Status: DC
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
## 2020-10-11 NOTE — Patient Instructions (Addendum)
  Depending on A1c we may need to increase Lantus slightly.  No changes in medications for now. Restart lisinopril once per day. Recheck in 63months. Keep up the good work with watching the diet!   If you have lab work done today you will be contacted with your lab results within the next 2 weeks.  If you have not heard from [place] then please contact us. The fastest way to get your results is to register for My Chart.   IF you received an x-ray today, you will receive an invoice from [facility]. Please contact [facility] at [phone] with questions or concerns regarding your invoice.   IF you received labwork today, you will receive an invoice from [facility]. Please contact [facility] at [phone] with questions or concerns regarding your invoice.   Our billing staff will not be able to assist you with questions regarding bills from these companies.  You will be contacted with the lab results as soon as they are available. The fastest way to get your results is to activate your My Chart account. Instructions are located on the last page of this paperwork. If you have not heard from [place] regarding the results in 2 weeks, please contact this office.

## 2020-10-12 LAB — COMPREHENSIVE METABOLIC PANEL
ALT: 17 IU/L (ref 0–44)
AST: 16 IU/L (ref 0–40)
Albumin/Globulin Ratio: 1.9 (ref 1.2–2.2)
Albumin: 4.4 g/dL (ref 3.7–4.7)
Alkaline Phosphatase: 97 IU/L (ref 44–121)
BUN/Creatinine Ratio: 20 (ref 10–24)
BUN: 17 mg/dL (ref 8–27)
Bilirubin Total: 0.9 mg/dL (ref 0.0–1.2)
CO2: 21 mmol/L (ref 20–29)
Calcium: 8.9 mg/dL (ref 8.6–10.2)
Chloride: 104 mmol/L (ref 96–106)
Creatinine, Ser: 0.85 mg/dL (ref 0.76–1.27)
Globulin, Total: 2.3 g/dL (ref 1.5–4.5)
Glucose: 151 mg/dL — ABNORMAL HIGH (ref 65–99)
Potassium: 4.8 mmol/L (ref 3.5–5.2)
Sodium: 140 mmol/L (ref 134–144)
Total Protein: 6.7 g/dL (ref 6.0–8.5)
eGFR: 92 mL/min/{1.73_m2} (ref >59)

## 2020-10-12 LAB — LIPID PANEL
Chol/HDL Ratio: 3.2 ratio (ref 0.0–5.0)
Cholesterol, Total: 172 mg/dL (ref 100–199)
HDL: 53 mg/dL (ref >39)
LDL Chol Calc (NIH): 100 mg/dL — ABNORMAL HIGH (ref 0–99)
Triglycerides: 104 mg/dL (ref 0–149)
VLDL Cholesterol Cal: 19 mg/dL (ref 5–40)

## 2020-10-12 LAB — HEMOGLOBIN A1C
Est. average glucose Bld gHb Est-mCnc: 206 mg/dL
Hgb A1c MFr Bld: 8.8 % — ABNORMAL HIGH (ref 4.8–5.6)

## 2020-10-21 ENCOUNTER — Other Ambulatory Visit: Payer: Self-pay | Admitting: Family Medicine

## 2020-10-21 DIAGNOSIS — Z794 Long term (current) use of insulin: Secondary | ICD-10-CM

## 2020-10-21 DIAGNOSIS — E1165 Type 2 diabetes mellitus with hyperglycemia: Secondary | ICD-10-CM

## 2020-10-21 MED ORDER — LANTUS SOLOSTAR 100 UNIT/ML ~~LOC~~ SOPN
10.0000 [IU] | PEN_INJECTOR | Freq: Every day | SUBCUTANEOUS | 2 refills | Status: DC
Start: 2020-10-21 — End: 2020-11-09

## 2020-10-21 NOTE — Progress Notes (Signed)
See lab notes

## 2020-10-22 ENCOUNTER — Telehealth: Payer: Self-pay | Admitting: Family Medicine

## 2020-10-22 NOTE — Telephone Encounter (Signed)
Patient saw missed call on his caller id this morning; returning call. No missed call documented.

## 2020-11-07 ENCOUNTER — Emergency Department (HOSPITAL_COMMUNITY): Payer: Medicare Other

## 2020-11-07 ENCOUNTER — Encounter (HOSPITAL_COMMUNITY): Payer: Self-pay

## 2020-11-07 ENCOUNTER — Observation Stay (HOSPITAL_COMMUNITY)
Admission: EM | Admit: 2020-11-07 | Discharge: 2020-11-09 | Disposition: A | Discharge status: Home / Self Care | Payer: Medicare Other | Attending: Internal Medicine | Admitting: Internal Medicine

## 2020-11-07 DIAGNOSIS — I10 Essential (primary) hypertension: Secondary | ICD-10-CM | POA: Diagnosis present

## 2020-11-07 DIAGNOSIS — Z79899 Other long term (current) drug therapy: Secondary | ICD-10-CM | POA: Diagnosis not present

## 2020-11-07 DIAGNOSIS — H4902 Third [oculomotor] nerve palsy, left eye: Secondary | ICD-10-CM | POA: Insufficient documentation

## 2020-11-07 DIAGNOSIS — Z20822 Contact with and (suspected) exposure to covid-19: Secondary | ICD-10-CM | POA: Diagnosis not present

## 2020-11-07 DIAGNOSIS — R42 Dizziness and giddiness: Secondary | ICD-10-CM | POA: Diagnosis not present

## 2020-11-07 DIAGNOSIS — Z7984 Long term (current) use of oral hypoglycemic drugs: Secondary | ICD-10-CM | POA: Diagnosis not present

## 2020-11-07 DIAGNOSIS — Z794 Long term (current) use of insulin: Secondary | ICD-10-CM | POA: Insufficient documentation

## 2020-11-07 DIAGNOSIS — H538 Other visual disturbances: Secondary | ICD-10-CM | POA: Diagnosis not present

## 2020-11-07 DIAGNOSIS — Z85038 Personal history of other malignant neoplasm of large intestine: Secondary | ICD-10-CM | POA: Diagnosis not present

## 2020-11-07 DIAGNOSIS — E119 Type 2 diabetes mellitus without complications: Secondary | ICD-10-CM | POA: Diagnosis not present

## 2020-11-07 LAB — URINALYSIS, ROUTINE W REFLEX MICROSCOPIC
Bilirubin Urine: NEGATIVE
Glucose, UA: >=500 mg/dL — AB
Ketones, ur: 5 mg/dL — AB
Nitrite: NEGATIVE
Protein, ur: NEGATIVE mg/dL
Specific Gravity, Urine: >1.046 — ABNORMAL HIGH (ref 1.005–1.030)
WBC, UA: >50 WBC/hpf — ABNORMAL HIGH (ref 0–5)
pH: 6 (ref 5.0–8.0)

## 2020-11-07 LAB — CBC
HCT: 43.2 % (ref 39.0–52.0)
Hemoglobin: 14.8 g/dL (ref 13.0–17.0)
MCH: 31.2 pg (ref 26.0–34.0)
MCHC: 34.3 g/dL (ref 30.0–36.0)
MCV: 90.9 fL (ref 80.0–100.0)
Platelets: 251 10*3/uL (ref 150–400)
RBC: 4.75 MIL/uL (ref 4.22–5.81)
RDW: 12.4 % (ref 11.5–15.5)
WBC: 7 10*3/uL (ref 4.0–10.5)
nRBC: 0 % (ref 0.0–0.2)

## 2020-11-07 LAB — DIFFERENTIAL
Abs Immature Granulocytes: 0.04 10*3/uL (ref 0.00–0.07)
Basophils Absolute: 0.1 10*3/uL (ref 0.0–0.1)
Basophils Relative: 2 %
Eosinophils Absolute: 0.3 10*3/uL (ref 0.0–0.5)
Eosinophils Relative: 4 %
Immature Granulocytes: 1 %
Lymphocytes Relative: 20 %
Lymphs Abs: 1.4 10*3/uL (ref 0.7–4.0)
Monocytes Absolute: 0.6 10*3/uL (ref 0.1–1.0)
Monocytes Relative: 9 %
Neutro Abs: 4.5 10*3/uL (ref 1.7–7.7)
Neutrophils Relative %: 64 %

## 2020-11-07 LAB — I-STAT CHEM 8, ED
BUN: 19 mg/dL (ref 8–23)
Calcium, Ion: 1.11 mmol/L — ABNORMAL LOW (ref 1.15–1.40)
Chloride: 106 mmol/L (ref 98–111)
Creatinine, Ser: 0.8 mg/dL (ref 0.61–1.24)
Glucose, Bld: 233 mg/dL — ABNORMAL HIGH (ref 70–99)
HCT: 43 % (ref 39.0–52.0)
Hemoglobin: 14.6 g/dL (ref 13.0–17.0)
Potassium: 3.9 mmol/L (ref 3.5–5.1)
Sodium: 141 mmol/L (ref 135–145)
TCO2: 23 mmol/L (ref 22–32)

## 2020-11-07 LAB — RAPID URINE DRUG SCREEN, HOSP PERFORMED
Amphetamines: NOT DETECTED
Barbiturates: NOT DETECTED
Benzodiazepines: NOT DETECTED
Cocaine: NOT DETECTED
Opiates: NOT DETECTED
Tetrahydrocannabinol: NOT DETECTED

## 2020-11-07 LAB — COMPREHENSIVE METABOLIC PANEL
ALT: 17 U/L (ref 0–44)
AST: 20 U/L (ref 15–41)
Albumin: 3.7 g/dL (ref 3.5–5.0)
Alkaline Phosphatase: 72 U/L (ref 38–126)
Anion gap: 8 (ref 5–15)
BUN: 17 mg/dL (ref 8–23)
CO2: 23 mmol/L (ref 22–32)
Calcium: 8.5 mg/dL — ABNORMAL LOW (ref 8.9–10.3)
Chloride: 107 mmol/L (ref 98–111)
Creatinine, Ser: 0.86 mg/dL (ref 0.61–1.24)
GFR, Estimated: >60 mL/min (ref >60)
Glucose, Bld: 242 mg/dL — ABNORMAL HIGH (ref 70–99)
Potassium: 3.9 mmol/L (ref 3.5–5.1)
Sodium: 138 mmol/L (ref 135–145)
Total Bilirubin: 1.4 mg/dL — ABNORMAL HIGH (ref 0.3–1.2)
Total Protein: 6.2 g/dL — ABNORMAL LOW (ref 6.5–8.1)

## 2020-11-07 LAB — CBG MONITORING, ED: Glucose-Capillary: 221 mg/dL — ABNORMAL HIGH (ref 70–99)

## 2020-11-07 LAB — PROTIME-INR
INR: 1.1 (ref 0.8–1.2)
Prothrombin Time: 13.4 seconds (ref 11.4–15.2)

## 2020-11-07 LAB — APTT: aPTT: 25 seconds (ref 24–36)

## 2020-11-07 MED ORDER — IOHEXOL 350 MG/ML SOLN
75.0000 mL | Freq: Once | INTRAVENOUS | Status: AC | PRN
  Administered 2020-11-07: 75 mL via INTRAVENOUS

## 2020-11-07 MED ORDER — SODIUM CHLORIDE 0.9% FLUSH
3.0000 mL | Freq: Once | INTRAVENOUS | Status: AC
  Administered 2020-11-07: 3 mL via INTRAVENOUS
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
## 2020-11-07 NOTE — ED Notes (Signed)
Pt to MRI

## 2020-11-07 NOTE — ED Triage Notes (Signed)
Pt comes via [facility] for blurred vision, dizziness, SOB, and feeling like he was going to pass out while at [place] around 430pm, pt drove home and then was unable to get out of the car. Blurred vision continues but is better.

## 2020-11-08 ENCOUNTER — Encounter (HOSPITAL_COMMUNITY): Payer: Self-pay | Admitting: Family Medicine

## 2020-11-08 DIAGNOSIS — R42 Dizziness and giddiness: Secondary | ICD-10-CM

## 2020-11-08 DIAGNOSIS — I10 Essential (primary) hypertension: Secondary | ICD-10-CM | POA: Diagnosis not present

## 2020-11-08 DIAGNOSIS — Z794 Long term (current) use of insulin: Secondary | ICD-10-CM | POA: Diagnosis not present

## 2020-11-08 DIAGNOSIS — E119 Type 2 diabetes mellitus without complications: Secondary | ICD-10-CM | POA: Diagnosis not present

## 2020-11-08 LAB — COMPREHENSIVE METABOLIC PANEL
ALT: 17 U/L (ref 0–44)
AST: 20 U/L (ref 15–41)
Albumin: 3.3 g/dL — ABNORMAL LOW (ref 3.5–5.0)
Alkaline Phosphatase: 65 U/L (ref 38–126)
Anion gap: 5 (ref 5–15)
BUN: 17 mg/dL (ref 8–23)
CO2: 26 mmol/L (ref 22–32)
Calcium: 8.2 mg/dL — ABNORMAL LOW (ref 8.9–10.3)
Chloride: 107 mmol/L (ref 98–111)
Creatinine, Ser: 0.92 mg/dL (ref 0.61–1.24)
GFR, Estimated: >60 mL/min (ref >60)
Glucose, Bld: 267 mg/dL — ABNORMAL HIGH (ref 70–99)
Potassium: 3.6 mmol/L (ref 3.5–5.1)
Sodium: 138 mmol/L (ref 135–145)
Total Bilirubin: 1.8 mg/dL — ABNORMAL HIGH (ref 0.3–1.2)
Total Protein: 5.8 g/dL — ABNORMAL LOW (ref 6.5–8.1)

## 2020-11-08 LAB — CBG MONITORING, ED: Glucose-Capillary: 190 mg/dL — ABNORMAL HIGH (ref 70–99)

## 2020-11-08 LAB — CBC
HCT: 40.4 % (ref 39.0–52.0)
Hemoglobin: 14 g/dL (ref 13.0–17.0)
MCH: 31.9 pg (ref 26.0–34.0)
MCHC: 34.7 g/dL (ref 30.0–36.0)
MCV: 92 fL (ref 80.0–100.0)
Platelets: 216 10*3/uL (ref 150–400)
RBC: 4.39 MIL/uL (ref 4.22–5.81)
RDW: 12.6 % (ref 11.5–15.5)
WBC: 6.9 10*3/uL (ref 4.0–10.5)
nRBC: 0 % (ref 0.0–0.2)

## 2020-11-08 LAB — RESP PANEL BY RT-PCR (FLU A&B, COVID) ARPGX2
Influenza A by PCR: NEGATIVE
Influenza B by PCR: NEGATIVE
SARS Coronavirus 2 by RT PCR: NEGATIVE

## 2020-11-08 LAB — LIPID PANEL
Cholesterol: 179 mg/dL (ref 0–200)
HDL: 49 mg/dL (ref >40)
LDL Cholesterol: 115 mg/dL — ABNORMAL HIGH (ref 0–99)
Total CHOL/HDL Ratio: 3.7 RATIO
Triglycerides: 77 mg/dL (ref <150)
VLDL: 15 mg/dL (ref 0–40)

## 2020-11-08 LAB — GLUCOSE, CAPILLARY
Glucose-Capillary: 272 mg/dL — ABNORMAL HIGH (ref 70–99)
Glucose-Capillary: 236 mg/dL — ABNORMAL HIGH (ref 70–99)
Glucose-Capillary: 139 mg/dL — ABNORMAL HIGH (ref 70–99)
Glucose-Capillary: 183 mg/dL — ABNORMAL HIGH (ref 70–99)

## 2020-11-08 MED ORDER — INSULIN GLARGINE 100 UNIT/ML ~~LOC~~ SOLN
5.0000 [IU] | Freq: Every day | SUBCUTANEOUS | Status: DC
  Administered 2020-11-08 (×2): 5 [IU] via SUBCUTANEOUS
  Filled 2020-11-08 (×3): qty 0.05

## 2020-11-08 MED ORDER — ASPIRIN 325 MG PO TABS
325.0000 mg | ORAL_TABLET | Freq: Once | ORAL | Status: AC
  Administered 2020-11-08: 325 mg via ORAL
  Filled 2020-11-08: qty 1

## 2020-11-08 MED ORDER — MECLIZINE HCL 12.5 MG PO TABS: 25.0000 mg | ORAL_TABLET | Freq: Three times a day (TID) | ORAL | Status: DC | PRN

## 2020-11-08 MED ORDER — ACETAMINOPHEN 650 MG RE SUPP: 650.0000 mg | Freq: Four times a day (QID) | RECTAL | Status: DC | PRN

## 2020-11-08 MED ORDER — ATORVASTATIN CALCIUM 10 MG PO TABS
10.0000 mg | ORAL_TABLET | Freq: Every day | ORAL | Status: DC
  Administered 2020-11-08 – 2020-11-09 (×2): 10 mg via ORAL
  Filled 2020-11-08 (×2): qty 1

## 2020-11-08 MED ORDER — ASPIRIN EC 81 MG PO TBEC
81.0000 mg | DELAYED_RELEASE_TABLET | Freq: Every day | ORAL | Status: DC
  Administered 2020-11-09: 81 mg via ORAL
  Filled 2020-11-08: qty 1

## 2020-11-08 MED ORDER — ACETAMINOPHEN 325 MG PO TABS: 650.0000 mg | ORAL_TABLET | Freq: Four times a day (QID) | ORAL | Status: DC | PRN

## 2020-11-08 MED ORDER — INSULIN ASPART 100 UNIT/ML ~~LOC~~ SOLN: 0.0000 [IU] | Freq: Every day | SUBCUTANEOUS | Status: DC

## 2020-11-08 MED ORDER — ONDANSETRON HCL 4 MG PO TABS: 4.0000 mg | ORAL_TABLET | Freq: Four times a day (QID) | ORAL | Status: DC | PRN

## 2020-11-08 MED ORDER — INSULIN ASPART 100 UNIT/ML ~~LOC~~ SOLN
0.0000 [IU] | Freq: Three times a day (TID) | SUBCUTANEOUS | Status: DC
  Administered 2020-11-08: 3 [IU] via SUBCUTANEOUS
  Administered 2020-11-08: 1 [IU] via SUBCUTANEOUS
  Administered 2020-11-08: 5 [IU] via SUBCUTANEOUS
  Administered 2020-11-09: 3 [IU] via SUBCUTANEOUS
  Administered 2020-11-09: 1 [IU] via SUBCUTANEOUS

## 2020-11-08 MED ORDER — SENNOSIDES-DOCUSATE SODIUM 8.6-50 MG PO TABS: 1.0000 | ORAL_TABLET | Freq: Every evening | ORAL | Status: DC | PRN

## 2020-11-08 MED ORDER — CHLORHEXIDINE GLUCONATE CLOTH 2 % EX PADS
6.0000 | MEDICATED_PAD | Freq: Every day | CUTANEOUS | Status: DC
  Administered 2020-11-08: 6 via TOPICAL

## 2020-11-08 MED ORDER — ONDANSETRON HCL 4 MG/2ML IJ SOLN: 4.0000 mg | Freq: Four times a day (QID) | INTRAMUSCULAR | Status: DC | PRN

## 2020-11-08 MED ORDER — ENOXAPARIN SODIUM 40 MG/0.4ML ~~LOC~~ SOLN
40.0000 mg | Freq: Every day | SUBCUTANEOUS | Status: DC
  Administered 2020-11-08 – 2020-11-09 (×2): 40 mg via SUBCUTANEOUS
  Filled 2020-11-08 (×2): qty 0.4

## 2020-11-08 MED ORDER — CHLORHEXIDINE GLUCONATE CLOTH 2 % EX PADS: 6.0000 | MEDICATED_PAD | Freq: Every day | CUTANEOUS | Status: DC

## 2020-11-08 NOTE — Consult Note (Signed)
Neurology Consultation  Reason for Consult: Dizziness, gait instability Referring Physician: Dr. Algis Liming  CC: Dizziness  History is obtained from:, Chart, patient's sister at bedside  HPI: William Oneill is a 75 y.o. male past medical history of diabetes, hypertension, colon cancer status post colectomy and chemotherapy, reported history of atrial fibrillation many years ago without recurrence not on anticoagulation, presented to the emergency room with dizziness-he describes room spinning and also the fact that he might lose consciousness.  He was unable to stand up unassisted.  He was also unsteady on his gait. Brought into the emergency room, MRI of the brain was performed that was negative for acute process. Morning assessment by telemedicine noticed nystagmus as well as incomplete adduction of the left eye. Neurological consultation was obtained due to the presentation, negative MRI and exam findings. 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  She also felt that his left eye movement is restricted more than usual. Denies any chest pain shortness of breath nausea vomiting.  Denies any fevers chills.  Denies any lateralized weakness tingling numbness.   LKW: Sometime 4:30 PM yesterday  tpa given?: no, outside the window and likely not a stroke Premorbid modified Rankin scale (mRS): 0  ROS: Full ROS was performed and is negative except as noted in the HPI.   Past Medical History:  Diagnosis Date  . A-fib ([place])    "years ago, for a short period, never came back"  . Anemia   . Cancer ([place])   . Cancer [facility])    colon cancer  . Colon cancer ([place])   . Diabetes mellitus without complication ([place])    type 2  . Diabetes mellitus without complication ([place])   . High cholesterol   . History of kidney stones    passed  . Insomnia   . Pneumonia    x1    Family History  Problem  Relation Age of Onset  . CAD Father   . CAD Sister   . CAD Brother   . Heart disease Father   . Heart attack Father   . Heart disease Sister   . Diabetes Sister   . Stroke Mother      Social History:   reports that he has never smoked. He has never used smokeless tobacco. He reports previous alcohol use. He reports that he does not use drugs.  Medications  Current Facility-Administered Medications:  .  acetaminophen (TYLENOL) tablet 650 mg, 650 mg, Oral, Q6H PRN **OR** acetaminophen (TYLENOL) suppository 650 mg, 650 mg, Rectal, Q6H PRN, Opyd, Ilene Qua, MD .  atorvastatin (LIPITOR) tablet 10 mg, 10 mg, Oral, Daily, Opyd, Timothy S, MD .  enoxaparin (LOVENOX) injection 40 mg, 40 mg, Subcutaneous, Daily, Opyd, Timothy S, MD .  insulin aspart (novoLOG) injection 0-5 Units, 0-5 Units, Subcutaneous, QHS, Opyd, Timothy S, MD .  insulin aspart (novoLOG) injection 0-9 Units, 0-9 Units, Subcutaneous, TID WC, Opyd, Ilene Qua, MD, 5 Units at 11/08/20 [id number] .  insulin glargine (LANTUS) injection 5 Units, 5 Units, Subcutaneous, QHS, Opyd, Ilene Qua, MD, 5 Units at 11/08/20 0125 .  meclizine (ANTIVERT) tablet 25 mg, 25 mg, Oral, TID PRN, Opyd, Timothy S, MD .  ondansetron (ZOFRAN) tablet 4 mg, 4 mg, Oral, Q6H PRN **OR** ondansetron (ZOFRAN) injection 4 mg, 4 mg, Intravenous, Q6H PRN, Opyd, Timothy S, MD .  senna-docusate (Senokot-S) tablet 1 tablet, 1 tablet, Oral, QHS  PRN, Vianne Bulls, MD   Exam: Current vital signs: BP 133/61 (BP Location: Left Arm)   Pulse (!) 57   Temp 98.5 F (36.9 C) (Oral)   Resp 18   Ht 5\' 6"  (1.676 m)   Wt 83 kg   SpO2 97%   BMI 29.54 kg/m  Vital signs in last 24 hours: Temp:  [97.5 F (36.4 C)-99.1 F (37.3 C)] 98.5 F (36.9 C) (04/07 1137) Pulse Rate:  [55-67] 57 (04/07 1137) Resp:  [9-24] 18 (04/07 1137) BP: (133-163)/(61-87) 133/61 (04/07 1137) SpO2:  [94 %-98 %] 97 % (04/07 1137) Weight:  [83 kg] 83 kg (04/06 1943) GENERAL: Awake, alert in  NAD HEENT: - Normocephalic and atraumatic, dry mm, no LN++, no Thyromegally LUNGS - Clear to auscultation bilaterally with no wheezes CV - S1S2 RRR, no m/r/g, equal pulses bilaterally. ABDOMEN - Soft, nontender, nondistended with normoactive BS Ext: warm, well perfused, intact peripheral pulses, no edema  NEURO:  Mental Status: AA&Ox3  Language: speech is mildly dysarthric-sister says he has a lisp at baseline and this is normal for him.  Naming, repetition, fluency, and comprehension intact. Cranial Nerves: Right pupil is round reactive to light, left pupil is mildly sluggish to react directly but reacts normally consensually,  Disconjugate gaze with left eye slightly and out extraocular movement examination reveals inability to adduct left eye past midline and mild rotatory nystagmus in all fields, visual fields full, no facial asymmetry, facial sensation intact, hearing intact, tongue/uvula/soft palate midline, normal sternocleidomastoid and trapezius muscle strength. No evidence of tongue atrophy or fibrillations Motor: 5/5 without drift Tone: is normal and bulk is normal Sensation- Intact to light touch bilaterally Coordination: FTN intact bilateral Gait- deferred  NIHSS-1   Labs I have reviewed labs in epic and the results pertinent to this consultation are:   CBC    Component Value Date/Time   WBC 6.9 11/08/2020 0331   RBC 4.39 11/08/2020 0331   HGB 14.0 11/08/2020 0331   HGB 14.5 08/23/2020 1245   HGB 12.6 (L) 09/29/2018 1517   HCT 40.4 11/08/2020 0331   HCT 37.6 09/29/2018 1517   PLT 216 11/08/2020 0331   PLT 228 08/23/2020 1245   PLT 186 09/29/2018 1517   MCV 92.0 11/08/2020 0331   MCV 95 09/29/2018 1517   MCH 31.9 11/08/2020 0331   MCHC 34.7 11/08/2020 0331   RDW 12.6 11/08/2020 0331   RDW 13.2 09/29/2018 1517   LYMPHSABS 1.4 11/07/2020 1949   LYMPHSABS 1.2 11/06/2017 1530   MONOABS 0.6 11/07/2020 1949   EOSABS 0.3 11/07/2020 1949   EOSABS 0.4 11/06/2017 1530    BASOSABS 0.1 11/07/2020 1949   BASOSABS 0.1 11/06/2017 1530    CMP     Component Value Date/Time   NA 138 11/08/2020 0331   NA 140 10/11/2020 1646   K 3.6 11/08/2020 0331   CL 107 11/08/2020 0331   CO2 26 11/08/2020 0331   GLUCOSE 267 (H) 11/08/2020 0331   BUN 17 11/08/2020 0331   BUN 17 10/11/2020 1646   CREATININE 0.92 11/08/2020 0331   CREATININE 1.00 08/23/2020 1245   CREATININE 0.73 01/10/2016 1400   CALCIUM 8.2 (L) 11/08/2020 0331   PROT 5.8 (L) 11/08/2020 0331   PROT 6.7 10/11/2020 1646   ALBUMIN 3.3 (L) 11/08/2020 0331   ALBUMIN 4.4 10/11/2020 1646   AST 20 11/08/2020 0331   AST 15 08/23/2020 1245   ALT 17 11/08/2020 0331   ALT 16 08/23/2020 1245   ALKPHOS  65 11/08/2020 0331   BILITOT 1.8 (H) 11/08/2020 0331   BILITOT 0.9 10/11/2020 1646   BILITOT 2.0 (H) 08/23/2020 1245   GFRNONAA >60 11/08/2020 0331   GFRNONAA >60 08/23/2020 1245   GFRNONAA >89 01/10/2016 1400   GFRAA >60 04/23/2020 1155   GFRAA >89 01/10/2016 1400    Lipid Panel     Component Value Date/Time   CHOL 179 11/08/2020 0331   CHOL 172 10/11/2020 1646   TRIG 77 11/08/2020 0331   HDL 49 11/08/2020 0331   HDL 53 10/11/2020 1646   CHOLHDL 3.7 11/08/2020 0331   VLDL 15 11/08/2020 0331   LDLCALC 115 (H) 11/08/2020 0331   LDLCALC 100 (H) 10/11/2020 1646     Imaging I have reviewed the images obtained:  CT-scan of the brain-no acute changes CT angiography shows an age-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  Further investigation with MRI recommended.  No evidence of any aneurysm. MRI brain with no acute infarction, few remote cerebellar infarcts bilaterally.  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  Assessment:  75 year old presenting with complaints of dizziness and vertiginous symptoms-describes room spinning and also some feelings of lightheadedness. Has a history of diabetes, and also  atrial fibrillation at some time in the past. Reports her symptoms are significantly improved right now. Physical therapy is on board for evaluation for peripheral vertigo. 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 His nystagmus also has compensatory but there is a rotational component to it making me suspect a peripheral vertigo such as BPPV being the cause of current presentation. Another consideration is paroxysmal atrial fibrillation which might cause dizziness, and near syncope.  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  Evaluate for paroxysmal atrial fibrillation  Recommendations:  At this time, I do not foresee any need for inpatient neurological work-up.  Vestibular rehabilitation will be the mainstay of treatment for now.  Meclizine for symptomatic management.  Diazepam can also be used for symptomatic vertigo management.  I would recommend an outpatient 30-day cardiac monitor to evaluate for proximal atrial fibrillation.  Strict glucose control with goal A1c of less than 7  ASA 81 mg   Outpatient neurology follow-up if continues to have dizziness and neurological symptoms.  Plan d/w Dr Algis Liming over the phone.  -- Amie Portland, MD Neurologist [facility] Pager: [phone]

## 2020-11-08 NOTE — Progress Notes (Signed)
Inpatient Diabetes Program Recommendations  AACE/ADA: New Consensus Statement on Inpatient Glycemic Control (2015)  Target Ranges:  Prepandial:   less than 140 mg/dL      Peak postprandial:   less than 180 mg/dL (1-2 hours)      Critically ill patients:  140 - 180 mg/dL   Lab Results  Component Value Date   GLUCAP 272 (H) 11/08/2020   HGBA1C 8.8 (H) 10/11/2020    Review of Glycemic Control Results for [name], [name] (MRN [id number]) as of 11/08/2020 10:32  Ref. Range 11/07/2020 19:44 11/08/2020 01:28 11/08/2020 06:10  Glucose-Capillary Latest Ref Range: 70 - 99 mg/dL 221 (H) 190 (H) 272 (H)   Diabetes history: DM 2 Outpatient Diabetes medications:  Lantus 10 units daily, Metformin 1000 mg bid, Januvia 100 mg daily Current orders for Inpatient glycemic control:  Novolog sensitive tid with meals and HS Lantus 5 units q HS Inpatient Diabetes Program Recommendations:   Please increase Lantus to 14 units q HS.  Will follow.   Thanks, Adah Perl, RN, BC-ADM Inpatient Diabetes Coordinator Pager [phone]

## 2020-11-08 NOTE — Progress Notes (Signed)
Patient arrived in the unit at 0145 am from [facility] A&Ox4, no s/s distress, denied of pain, resting in a bed, initiated telemetry monitoring, and will continue to monitor closely.

## 2020-11-08 NOTE — Plan of Care (Signed)

## 2020-11-08 NOTE — Evaluation (Signed)
Physical Therapy Evaluation Patient Details Name: William Oneill MRN: [id number] DOB: [birth year] Today's Date: 11/08/2020   History of Present Illness  William Oneill is a 75 y.o. male presented to the emergency room with dizziness-he describes room spinning and also the fact that he might lose consciousness.  He was unable to stand up unassisted.  He was also unsteady on his gait.  MRI negative for acute issue. PMH:  diabetes, hypertension, colon cancer status post colectomy and chemotherapy, atrial fibrillation  Clinical Impression  Pt admitted with above diagnosis. Difficult vestibular assessment as pt with limitations in neck movement as well as pt with left eye dysfunction for years.  Pt did appear to have left rotary upbeating nystagmus therefore did perform a left posterior canal repositioning.  Could not ilicit any dizziness with any aspect of mobility today.  Pt with very poor balance once he stood with pt needing mod assist to stand at EOB and to pivot to the chair with RW.  Pt losing balance posteriorly and to his right. Will continue to progress pt as able.   Pt currently with functional limitations due to the deficits listed below (see PT Problem List). Pt will benefit from skilled PT to increase their independence and safety with mobility to allow discharge to the venue listed below.      Follow Up Recommendations SNF    Equipment Recommendations  Rolling walker with 5" wheels    Recommendations for Other Services       Precautions / Restrictions Precautions Precautions: Fall Restrictions Weight Bearing Restrictions: No      Mobility  Bed Mobility Overal bed mobility: Needs Assistance Bed Mobility: Supine to Sit     Supine to sit: Mod assist     General bed mobility comments: Needed assist to come to eOB.    Transfers Overall transfer level: Needs assistance Equipment used: Rolling walker (2 wheeled);None Transfers: Sit to/from [employer] Sit to Stand: Mod assist;From elevated surface Stand pivot transfers: Mod assist       General transfer comment: Pt needed mod assist to power up initially needing assist to stay in standing as he had heavy posterior lean and right lateral lean upon standing. Once pt had a hold of RW, was able to get slightly steadier but still needing mod assist for transfer to chair with RW with pt leaning right and was unaware.  Ambulation/Gait                Stairs            Wheelchair Mobility    Modified Rankin (Stroke Patients Only) Modified Rankin (Stroke Patients Only) Pre-Morbid Rankin Score: No significant disability Modified Rankin: Moderately severe disability     Balance Overall balance assessment: Needs assistance Sitting-balance support: No upper extremity supported;Feet supported Sitting balance-Leahy Scale: Fair   Postural control: Posterior lean;Right lateral lean Standing balance support: Bilateral upper extremity supported;During functional activity Standing balance-Leahy Scale: Poor Standing balance comment: Pt relies on external support as well as bil UE support for balance in standing.                             Pertinent Vitals/Pain Pain Assessment: No/denies pain    Home Living Family/patient expects to be discharged to:: Private residence Living Arrangements: Children;Other relatives (sister) Available Help at Discharge: Family;Available 24 hours/day Type of Home: House Home Access: Level entry     Home Layout: One  level Home Equipment: Shower seat - built in;Toilet riser      Prior Function Level of Independence: Independent               Hand Dominance   Dominant Hand: Right    Extremity/Trunk Assessment   Upper Extremity Assessment Upper Extremity Assessment: Defer to OT evaluation    Lower Extremity Assessment Lower Extremity Assessment: RLE deficits/detail RLE Coordination: decreased gross motor     Cervical / Trunk Assessment Cervical / Trunk Assessment: Normal  Communication   Communication: No difficulties  Cognition Arousal/Alertness: Awake/alert Behavior During Therapy: WFL for tasks assessed/performed Overall Cognitive Status: Within Functional Limits for tasks assessed                                        General Comments General comments (skin integrity, edema, etc.): Pt with no reports of dizziness today however did appear to have left rotary nystagmus that is upbeating when looking left or right.  Pt has a disconjugate gaze that he has had for some time.  Difficult to fully assess pt.  Did perform a left Epley manuever prior to sitting up as this would not hurt to attempt.  Could not elicit dizziness/spinning today with any movements completed.    Exercises     Assessment/Plan    PT Assessment Patient needs continued PT services  PT Problem List Decreased balance;Decreased activity tolerance;Decreased mobility;Decreased coordination;Decreased knowledge of use of DME;Decreased safety awareness;Decreased knowledge of precautions       PT Treatment Interventions Gait training;DME instruction;Functional mobility training;Therapeutic activities;Therapeutic exercise;Balance training;Patient/family education    PT Goals (Current goals can be found in the Care Plan section)  Acute Rehab PT Goals Patient Stated Goal: to get stronger PT Goal Formulation: With patient Time For Goal Achievement: 11/22/20 Potential to Achieve Goals: Good    Frequency Min 3X/week   Barriers to discharge        Co-evaluation               AM-PAC PT "6 Clicks" Mobility  Outcome Measure Help needed turning from your back to your side while in a flat bed without using bedrails?: A Lot Help needed moving from lying on your back to sitting on the side of a flat bed without using bedrails?: A Lot Help needed moving to and from a bed to a chair (including a  wheelchair)?: Total Help needed standing up from a chair using your arms (e.g., wheelchair or bedside chair)?: A Lot Help needed to walk in hospital room?: Total Help needed climbing 3-5 steps with a railing? : Total 6 Click Score: 9    End of Session Equipment Utilized During Treatment: Gait belt Activity Tolerance: Patient limited by fatigue Patient left: in chair;with call bell/phone within reach;with chair alarm set Nurse Communication: Mobility status PT Visit Diagnosis: Unsteadiness on feet (R26.81);Muscle weakness (generalized) (M62.81)    Time: 9371-6967 PT Time Calculation (min) (ACUTE ONLY): 28 min   Charges:   PT Evaluation $PT Eval Moderate Complexity: 1 Mod PT Treatments $Therapeutic Activity: 8-22 mins        Chemeka Filice M,PT Acute Rehab Services [phone] (pager)  Alvira Philips 11/08/2020, 2:05 PM

## 2020-11-08 NOTE — Progress Notes (Signed)
PROGRESS NOTE   Albaraa Swingle  JSH:[id number]    DOB: May 21, 1946    DOA: 11/07/2020  PCP: [name], MD   I have briefly reviewed patients previous medical records in [facility].  Chief Complaint  Patient presents with  . Blurred Vision    Brief Narrative:  75 year old male, lives with his sister, independent, medical history significant for but not limited to insulin-dependent type 2 DM, hypertension, colon cancer s/p colectomy and chemotherapy, reported history of A. fib many years ago without recurrence and not on anticoagulation, presented to the ED via EMS for evaluation of dizziness, near syncope, vertigo and transient diplopia.  Symptoms were severe enough where he was unable to ambulate well.  MRI brain negative for acute process.  Noted incomplete abduction of left eye and consulted Neurology for assistance.   Assessment & Plan:  Principal Problem:   Vertigo Active Problems:   Type 2 diabetes mellitus without complication (HCC)   Essential hypertension   Vertigo, possibly BPPV  MRI negative for acute stroke.  Had some nystagmus which could be compensatory from left 3rd nerve palsy but there is also a rotational component that could be due to peripheral vertigo.  Neurology consultation appreciated.  Discussed with Dr. Rory Percy, agrees with vestibular rehab by PT for peripheral vertigo, as needed meclizine and may even consider diazepam if needed.  PT evaluated and recommend SNF.  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  Etiology likely DM and not likely to be aneurysm by CTA was unremarkable.  DR. Rory Percy recommends 30-day cardiac monitor to evaluate for paroxysmal A. fib-I have contacted [place] card master for  same.  Strict DM control, eyepatch if has issues again with diplopia and aspirin 81 mg daily.  Outpatient neurology follow-up if continues to have dizziness or neurological symptoms.  Type II DM, uncontrolled  A1c 8.82 weeks ago.  Continue Lantus and Metformin at discharge but will need close PCP follow-up for tighter control.    Continue Lantus and SSI.  Hyperlipidemia  Continue statins.  Essential hypertension  Mildly uncontrolled at times.  Resume home dose of lisinopril.  Body mass index is 29.54 kg/m.    DVT prophylaxis: enoxaparin (LOVENOX) injection 40 mg Start: 11/08/20 1000     Code Status: Full Code Family Communication: None at bedside this morning. Disposition:  Status is: Observation  Patient is medically optimized for discharge but per PT requires SNF.  There is no medical intensity to change him to inpatient status.  Dispo: The patient is from: Home              Anticipated d/c is to: SNF              Patient currently is medically stable for DC.   Difficult to place patient No        Consultants:   Neurology  Procedures:   None  Antimicrobials:    Anti-infectives (From admission, onward)   None        Subjective:  Apart from generalized weakness, patient reports feeling much better.  No dizziness, vertigo or any strokelike symptoms.  Patient not a great historian.  At times gives conflicting reports  Objective:   Vitals:   11/08/20 0545  11/08/20 0823 11/08/20 1137 11/08/20 1553  BP: (!) 156/73 (!) 143/74 133/61 (!) 150/84  Pulse: (!) 57 62 (!) 57 (!) 59  Resp: 18 18 18 18   Temp: 97.9 F (36.6 C) 98.1 F (36.7 C) 98.5 F (36.9 C) 98 F (36.7 C)  TempSrc: Axillary Oral Oral Oral  SpO2: 97% 98% 97% 98%  Weight:      Height:        General exam: Elderly male, moderately built and nourished lying comfortably propped up in bed without distress Respiratory system: Clear to auscultation. Respiratory effort  normal. Cardiovascular system: S1 & S2 heard, RRR. No JVD, murmurs, rubs, gallops or clicks. No pedal edema.  Telemetry personally reviewed: SB in the 50s-SR in the 60s. Gastrointestinal system: Abdomen is nondistended, soft and nontender. No organomegaly or masses felt. Normal bowel sounds heard. Central nervous system: Alert and oriented. No facial asymmetry.  Although mild dysarthria noted by patient's RN and neurology, this is his baseline per patient and his sister.  Disconjugate gaze with left eye slightly deviated to the left and unable to adduct left eye beyond midline.  Mild rotatory nystagmus in all fields. Extremities: Symmetric 5 x 5 power. Skin: No rashes, lesions or ulcers Psychiatry: Judgement and insight somewhat impaired. Mood & affect appropriate.     Data Reviewed:   I have personally reviewed following labs and imaging studies   CBC: Recent Labs  Lab 11/07/20 1949 11/07/20 1958 11/08/20 0331  WBC 7.0  --  6.9  NEUTROABS 4.5  --   --   HGB 14.8 14.6 14.0  HCT 43.2 43.0 40.4  MCV 90.9  --  92.0  PLT 251  --  834    Basic Metabolic Panel: Recent Labs  Lab 11/07/20 1949 11/07/20 1958 11/08/20 0331  NA 138 141 138  K 3.9 3.9 3.6  CL 107 106 107  CO2 23  --  26  GLUCOSE 242* 233* 267*  BUN 17 19 17   CREATININE 0.86 0.80 0.92  CALCIUM 8.5*  --  8.2*    Liver Function Tests: Recent Labs  Lab 11/07/20 1949 11/08/20 0331  AST 20 20  ALT 17 17  ALKPHOS 72 65  BILITOT 1.4* 1.8*  PROT 6.2* 5.8*  ALBUMIN 3.7 3.3*    CBG: Recent Labs  Lab 11/08/20 0610 11/08/20 1136 11/08/20 1552  GLUCAP 272* 236* 139*    Microbiology Studies:   Recent Results (from the past 240 hour(s))  Resp Panel by RT-PCR (Flu A&B, Covid) Urine, Clean Catch     Status: None   Collection Time: 11/07/20 10:24 PM   Specimen: Urine, Clean Catch; Nasopharyngeal(NP) swabs in vial transport medium  Result Value Ref Range Status   SARS Coronavirus 2 by RT PCR NEGATIVE NEGATIVE  Final    Comment: (NOTE) SARS-CoV-2 target nucleic acids are NOT DETECTED.  The SARS-CoV-2 RNA is generally detectable in upper respiratory specimens during the acute phase of infection. The lowest concentration of SARS-CoV-2 viral copies this assay can detect is 138 copies/mL. A negative result does not preclude SARS-Cov-2 infection and should not be used as the sole basis for treatment or other patient management decisions. A negative result may occur with  improper specimen collection/handling, submission of specimen other than nasopharyngeal swab, presence of viral mutation(s) within the areas targeted by this assay, and inadequate number of viral copies(<138 copies/mL). A negative result must be combined with clinical observations, patient history, and epidemiological information. The expected result is Negative.  Fact Sheet  for Patients:  EntrepreneurPulse.com.au  Fact Sheet for Healthcare Providers:  IncredibleEmployment.be  This test is no t yet approved or cleared by the [place] FDA and  has been authorized for detection and/or diagnosis of SARS-CoV-2 by FDA under an Emergency Use Authorization (EUA). This EUA will remain  in effect (meaning this test can be used) for the duration of the COVID-19 declaration under Section 564(b)(1) of the Act, 21 U.S.C.section 360bbb-3(b)(1), unless the authorization is terminated  or revoked sooner.       Influenza A by PCR NEGATIVE NEGATIVE Final   Influenza B by PCR NEGATIVE NEGATIVE Final    Comment: (NOTE) The Xpert Xpress SARS-CoV-2/FLU/RSV plus assay is intended as an aid in the diagnosis of influenza from Nasopharyngeal swab specimens and should not be used as a sole basis for treatment. Nasal washings and aspirates are unacceptable for Xpert Xpress SARS-CoV-2/FLU/RSV testing.  Fact Sheet for Patients: EntrepreneurPulse.com.au  Fact Sheet for Healthcare  Providers: IncredibleEmployment.be  This test is not yet approved or cleared by the [place] FDA and has been authorized for detection and/or diagnosis of SARS-CoV-2 by FDA under an Emergency Use Authorization (EUA). This EUA will remain in effect (meaning this test can be used) for the duration of the COVID-19 declaration under Section 564(b)(1) of the Act, 21 U.S.C. section 360bbb-3(b)(1), unless the authorization is terminated or revoked.  Performed at [facility], [place] [address], [place], [place] [address]      Radiology Studies:  CT Angio Head W or Wo Contrast  Result Date: 11/07/2020 CLINICAL DATA:  Initial evaluation for acute dizziness, blurry vision. EXAM: CT ANGIOGRAPHY HEAD AND NECK TECHNIQUE: Multidetector CT imaging of the head and neck was performed using the standard protocol during bolus administration of intravenous contrast. Multiplanar CT image reconstructions and MIPs were obtained to evaluate the vascular anatomy. Carotid stenosis measurements (when applicable) are obtained utilizing NASCET criteria, using the distal internal carotid diameter as the denominator. CONTRAST:  3mL OMNIPAQUE IOHEXOL 350 MG/ML SOLN COMPARISON:  Prior study from 09/21/2019 FINDINGS: CT HEAD FINDINGS Brain: Generalized cerebral atrophy with mild chronic small vessel ischemic disease. Small remote left cerebellar infarct. No acute intracranial hemorrhage. No acute large vessel territory infarct. No mass lesion, mass effect or midline shift. No hydrocephalus or extra-axial fluid collection. Vascular: No hyperdense vessel. Calcified atherosclerosis at the skull base. Skull: 1.5 cm group well-circumscribed hypodense lesion at the left suboccipital scalp, likely a sebaceous cyst. Scalp soft tissues demonstrate no other acute finding. Calvarium intact. Sinuses: Chronic right maxillary sinusitis noted. Paranasal sinuses are otherwise clear. No mastoid effusion. Orbits: Globes  orbital soft tissues within normal limits. CTA NECK FINDINGS Aortic arch: Visualized aortic arch normal in caliber with normal branch pattern. Mild atheromatous change about the aortic arch and origin of the great vessels without hemodynamically significant stenosis. Right carotid system: Right CCA patent from its origin to the bifurcation without stenosis. Mild scattered calcified plaque about the proximal right ICA without significant stenosis. Right ICA widely patent distally without stenosis, dissection or occlusion. Left carotid system: Left CCA patent from its origin to the bifurcation without stenosis. Mild atheromatous plaque about the left carotid bulb/proximal left ICA without significant stenosis. Left ICA widely patent distally without stenosis, dissection or occlusion. Vertebral arteries: Both vertebral arteries arise from the subclavian arteries. No proximal subclavian artery stenosis. Left vertebral artery dominant, with a diffusely hypoplastic right vertebral artery. Mixed plaque at the origin of the left vertebral artery with associated severe ostial stenosis (  series 11, image 183). Vertebral arteries otherwise patent without stenosis, dissection or occlusion. Skeleton: No visible acute osseous finding. No discrete or worrisome osseous lesions. Other neck: No other acute soft tissue abnormality within the neck. No mass or adenopathy. Upper chest: Visualized upper chest demonstrates no acute finding. Partially visualized lungs are grossly clear. Review of the MIP images confirms the above findings CTA HEAD FINDINGS Anterior circulation: Petrous segments patent bilaterally. Scattered atheromatous plaque within the carotid siphons bilaterally. Associated short-segment moderate approximate 50% stenosis at the cavernous right ICA. No significant stenosis about the contralateral left ICA. A1 segments patent bilaterally. Normal anterior communicating artery complex. Anterior cerebral arteries patent to  their distal aspects without stenosis. No M1 stenosis or occlusion. Normal MCA bifurcations. Distal MCA branches well perfused and symmetric. Scattered small vessel atheromatous irregularity noted. Posterior circulation: Dominant left V4 segment tortuous but patent to the vertebrobasilar junction without stenosis. Hypoplastic right vertebral artery patent as well. Left PICA origin patent and normal. Right PICA not seen. Basilar diminutive patent to its distal aspect without stenosis. Superior cerebral arteries patent bilaterally. Predominant fetal type origin of the PCAs supplied via a robust bilateral posterior communicating arteries. Small hypoplastic P1 segments are noted, with a focal severe stenosis at the hypoplastic left P1 segment (series 13, image 29). Left PCA well perfused to its distal aspect. There is age indeterminate occlusion of a distal right P3 segment (series 15, image 22). Right PCA otherwise perfused to its distal aspect. Venous sinuses: Grossly patent allowing for timing the contrast bolus. Anatomic variants: Prominent fetal type origin of the PCAs with overall diminutive vertebrobasilar system. Review of the MIP images confirms the above findings IMPRESSION: CT HEAD IMPRESSION: 1. No acute intracranial abnormality. 2. Generalized cerebral atrophy with mild chronic small vessel ischemic disease. 3. Chronic right maxillary sinusitis. 4. 1.5 cm probable sebaceous cyst at the left suboccipital scalp. Correlation with physical exam recommended. CTA HEAD AND NECK IMPRESSION: 1. 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 No signs of evolving ischemia on corresponding noncontrast head CT at this time. Finding could be further assessed with dedicated brain MRI as clinically warranted. 2. Atheromatous plaque at the origin of the dominant left vertebral artery with associated severe ostial stenosis. 3. Predominant fetal type origin of the PCAs with overall diminutive vertebrobasilar system.  Electronically Signed   By: Jeannine Boga M.D.   On: 11/07/2020 21:36   CT Angio Neck W and/or Wo Contrast  Result Date: 11/07/2020 CLINICAL DATA:  Initial evaluation for acute dizziness, blurry vision. EXAM: CT ANGIOGRAPHY HEAD AND NECK TECHNIQUE: Multidetector CT imaging of the head and neck was performed using the standard protocol during bolus administration of intravenous contrast. Multiplanar CT image reconstructions and MIPs were obtained to evaluate the vascular anatomy. Carotid stenosis measurements (when applicable) are obtained utilizing NASCET criteria, using the distal internal carotid diameter as the denominator. CONTRAST:  10mL OMNIPAQUE IOHEXOL 350 MG/ML SOLN COMPARISON:  Prior study from 09/21/2019 FINDINGS: CT HEAD FINDINGS Brain: Generalized cerebral atrophy with mild chronic small vessel ischemic disease. Small remote left cerebellar infarct. No acute intracranial hemorrhage. No acute large vessel territory infarct. No mass lesion, mass effect or midline shift. No hydrocephalus or extra-axial fluid collection. Vascular: No hyperdense vessel. Calcified atherosclerosis at the skull base. Skull: 1.5 cm group well-circumscribed hypodense lesion at the left suboccipital scalp, likely a sebaceous cyst. Scalp soft tissues demonstrate no other acute finding. Calvarium intact. Sinuses: Chronic right maxillary sinusitis noted. Paranasal sinuses are otherwise clear. No mastoid effusion.  Orbits: Globes orbital soft tissues within normal limits. CTA NECK FINDINGS Aortic arch: Visualized aortic arch normal in caliber with normal branch pattern. Mild atheromatous change about the aortic arch and origin of the great vessels without hemodynamically significant stenosis. Right carotid system: Right CCA patent from its origin to the bifurcation without stenosis. Mild scattered calcified plaque about the proximal right ICA without significant stenosis. Right ICA widely patent distally without stenosis,  dissection or occlusion. Left carotid system: Left CCA patent from its origin to the bifurcation without stenosis. Mild atheromatous plaque about the left carotid bulb/proximal left ICA without significant stenosis. Left ICA widely patent distally without stenosis, dissection or occlusion. Vertebral arteries: Both vertebral arteries arise from the subclavian arteries. No proximal subclavian artery stenosis. Left vertebral artery dominant, with a diffusely hypoplastic right vertebral artery. Mixed plaque at the origin of the left vertebral artery with associated severe ostial stenosis (series 11, image 183). Vertebral arteries otherwise patent without stenosis, dissection or occlusion. Skeleton: No visible acute osseous finding. No discrete or worrisome osseous lesions. Other neck: No other acute soft tissue abnormality within the neck. No mass or adenopathy. Upper chest: Visualized upper chest demonstrates no acute finding. Partially visualized lungs are grossly clear. Review of the MIP images confirms the above findings CTA HEAD FINDINGS Anterior circulation: Petrous segments patent bilaterally. Scattered atheromatous plaque within the carotid siphons bilaterally. Associated short-segment moderate approximate 50% stenosis at the cavernous right ICA. No significant stenosis about the contralateral left ICA. A1 segments patent bilaterally. Normal anterior communicating artery complex. Anterior cerebral arteries patent to their distal aspects without stenosis. No M1 stenosis or occlusion. Normal MCA bifurcations. Distal MCA branches well perfused and symmetric. Scattered small vessel atheromatous irregularity noted. Posterior circulation: Dominant left V4 segment tortuous but patent to the vertebrobasilar junction without stenosis. Hypoplastic right vertebral artery patent as well. Left PICA origin patent and normal. Right PICA not seen. Basilar diminutive patent to its distal aspect without stenosis. Superior  cerebral arteries patent bilaterally. Predominant fetal type origin of the PCAs supplied via a robust bilateral posterior communicating arteries. Small hypoplastic P1 segments are noted, with a focal severe stenosis at the hypoplastic left P1 segment (series 13, image 29). Left PCA well perfused to its distal aspect. There is age indeterminate occlusion of a distal right P3 segment (series 15, image 22). Right PCA otherwise perfused to its distal aspect. Venous sinuses: Grossly patent allowing for timing the contrast bolus. Anatomic variants: Prominent fetal type origin of the PCAs with overall diminutive vertebrobasilar system. Review of the MIP images confirms the above findings IMPRESSION: CT HEAD IMPRESSION: 1. No acute intracranial abnormality. 2. Generalized cerebral atrophy with mild chronic small vessel ischemic disease. 3. Chronic right maxillary sinusitis. 4. 1.5 cm probable sebaceous cyst at the left suboccipital scalp. Correlation with physical exam recommended. CTA HEAD AND NECK IMPRESSION: 1. 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 No signs of evolving ischemia on corresponding noncontrast head CT at this time. Finding could be further assessed with dedicated brain MRI as clinically warranted. 2. Atheromatous plaque at the origin of the dominant left vertebral artery with associated severe ostial stenosis. 3. Predominant fetal type origin of the PCAs with overall diminutive vertebrobasilar system. Electronically Signed   By: Jeannine Boga M.D.   On: 11/07/2020 21:36   MR BRAIN WO CONTRAST  Result Date: 11/08/2020 CLINICAL DATA:  Initial evaluation for neuro deficit, stroke suspected. EXAM: MRI HEAD WITHOUT CONTRAST TECHNIQUE: Multiplanar, multiecho pulse sequences of the brain and surrounding structures  were obtained without intravenous contrast. COMPARISON:  Prior CTA from earlier the same day. FINDINGS: Brain: Generalized age-related cerebral atrophy. Mild chronic microvascular  ischemic disease noted involving the periventricular and deep white matter both cerebral hemispheres. Few small remote bilateral cerebellar infarcts noted. Small focus of associated chronic hemosiderin staining noted at the left cerebellum. No abnormal foci of restricted diffusion to suggest acute or subacute ischemia. Gray-white matter differentiation maintained. No encephalomalacia to suggest chronic cortical infarction elsewhere within the brain. No other evidence for acute or chronic intracranial hemorrhage. No mass lesion, midline shift or mass effect. No hydrocephalus or extra-axial fluid collection. Pituitary gland and suprasellar region within normal limits. Midline structures intact. Vascular: Hypoplastic right vertebral artery not well seen. Major intracranial vascular flow voids are otherwise maintained. Skull and upper cervical spine: Craniocervical junction within normal limits. Bone marrow signal intensity normal. Probable sebaceous cyst noted at the left suboccipital scalp. Scalp soft tissues otherwise unremarkable. Sinuses/Orbits: Globes and orbital soft tissues demonstrate no acute finding. Chronic right maxillary sinusitis. Additional scattered mucosal thickening throughout the remainder of the paranasal sinuses. Trace bilateral mastoid effusions, of doubtful significance. Inner ear structures grossly normal. Visualized nasopharynx within normal limits. Other: None. IMPRESSION: 1. No acute intracranial abnormality. 2. Few small remote bilateral cerebellar infarcts. 3. Underlying mild chronic microvascular ischemic disease. 4. Chronic right maxillary sinusitis. Electronically Signed   By: Jeannine Boga M.D.   On: 11/08/2020 01:05   DG Chest Portable 1 View  Result Date: 11/07/2020 CLINICAL DATA:  Weakness, blurred/double vision since 1600 hours today EXAM: PORTABLE CHEST 1 VIEW COMPARISON:  Portable exam 2017 hours compared to 07/19/2020 FINDINGS: Normal heart size, mediastinal contours,  and pulmonary vascularity. Atherosclerotic calcification aorta. Lungs clear. No infiltrate, pleural effusion or pneumothorax. Bones demineralized. IMPRESSION: No acute abnormalities. Electronically Signed   By: Lavonia Dana M.D.   On: 11/07/2020 20:58     Scheduled Meds:   . atorvastatin  10 mg Oral Daily  . Chlorhexidine Gluconate Cloth  6 each Topical Daily  . enoxaparin (LOVENOX) injection  40 mg Subcutaneous Daily  . insulin aspart  0-5 Units Subcutaneous QHS  . insulin aspart  0-9 Units Subcutaneous TID WC  . insulin glargine  5 Units Subcutaneous QHS    Continuous Infusions:     LOS: 0 days     Vernell Leep, MD, [place], [facility]. [facility]    To contact the attending provider between 7A-7P or the covering provider during after hours 7P-7A, please log into the web site [email or web address] and access using universal [place] password for that web site. If you do not have the password, please call the hospital operator.  11/08/2020, 4:54 PM

## 2020-11-08 NOTE — H&P (Signed)
History and Physical    William Oneill:[id number] DOB: 09/08/1945 DOA: 11/07/2020  PCP: Shade Flood, MD   Patient coming from: Home   Chief Complaint: Dizzy, near-syncope   HPI: William Oneill is a 75 y.o. male with medical history significant for insulin-dependent diabetes mellitus, hypertension, and history of colon cancer status post colectomy and chemotherapy, now presenting to the emergency department for evaluation of dizziness and near syncope.  Patient reports that he was in his usual state of health and until approximately 4:30 PM when he developed dizziness, described as a sensation that the room was spinning, felt as though he might lose consciousness, and was unable to stand up unassisted.  He was in his car after returning home from a store and was unable to get out of his car.  His sister helped him get out of the car and inside where he laid down.  Symptoms persisted, eventually prompting him to come into the ED with EMS.  Symptoms have been improving but not yet resolved.  He denies any associated headache.  Denies any recent fall or trauma.  Denies fevers, chills, cough, chest pain, or palpitations.  He has never experienced this previously.  ED Course: Upon arrival to the ED, patient is found to be afebrile, saturating mid 90s on room air, with blood pressure 147/79.  EKG features sinus rhythm.  Chest x-ray negative for acute cardiopulmonary disease.  Noncontrast head CT negative for acute intracranial abnormality.  CTA head and neck notable for age-indeterminate distal right P3 occlusion without signs of evolving ischemia.  Chemistry panel notable for glucose of 242.  CBC unremarkable.  COVID-19 screening test negative.  MRI brain ordered but not yet resulted.  Review of Systems:  All other systems reviewed and apart from HPI, are negative.  Past Medical History:  Diagnosis Date  . A-fib (HCC)    "years ago, for a short period, never came back"  . Anemia   .  Cancer (HCC)   . Cancer [facility])    colon cancer  . Colon cancer (HCC)   . Diabetes mellitus without complication (HCC)    type 2  . Diabetes mellitus without complication (HCC)   . High cholesterol   . History of kidney stones    passed  . Insomnia   . Pneumonia    x1    Past Surgical History:  Procedure Laterality Date  . COLON SURGERY     laparoscopic ascending colectomy  Dr. Donell Beers 02-02-18  . COLONOSCOPY    . LAPAROSCOPIC PARTIAL COLECTOMY N/A 02/02/2018   Procedure: LAPAROSCOPIC ASCENDING COLECTOMY;  Surgeon: Almond Lint, MD;  Location: [facility];  Service: General;  Laterality: N/A;  . PORT-A-CATH REMOVAL Left 02/23/2020   Procedure: REMOVAL PORT-A-CATH;  Surgeon: Almond Lint, MD;  Location: [facility] OR;  Service: General;  Laterality: Left;  . PORTACATH PLACEMENT Left 03/03/2018   Procedure: INSERTION PORT-A-CATH;  Surgeon: Almond Lint, MD;  Location: [facility];  Service: General;  Laterality: Left;  . TONSILLECTOMY     as a child    Social History:   reports that he has never smoked. He has never used smokeless tobacco. He reports previous alcohol use. He reports that he does not use drugs.  No Known Allergies  Family History  Problem Relation Age of Onset  . CAD Father   . CAD Sister   . CAD Brother   . Heart disease Father   . Heart attack Father   . Heart  disease Sister   . Diabetes Sister   . Stroke Mother      Prior to Admission medications   Medication Sig Start Date End Date Taking? Authorizing Provider  atorvastatin (LIPITOR) 10 MG tablet Take 1 tablet (10 mg total) by mouth daily. 10/11/20   [name], MD  blood glucose meter kit and supplies Dispense based on patient and insurance preference. Use up to four times daily as directed. (FOR ICD-9 250.00, 250.01). 12/11/14   [name], MD  glucose blood (ONE TOUCH ULTRA TEST) test strip USE AS DIRECTED TO TEST UP TO 4 TIMES A DAY 08/12/18   [name], MD   guaiFENesin-dextromethorphan (ROBITUSSIN DM) 100-10 MG/5ML syrup Take 5 mLs by mouth every 4 (four) hours as needed for cough. 07/16/20   Maximiano Coss, NP  insulin glargine (LANTUS SOLOSTAR) 100 UNIT/ML Solostar Pen Inject 10 Units into the skin daily. 10/21/20   [name], MD  Insulin Pen Needle (PEN NEEDLES) 32G X 4 MM MISC 1 application by Does not apply route daily. 08/12/18   [name], MD  Lancets (ACCU-CHEK SOFT TOUCH) lancets Use as instructed 08/12/18   [name], MD  lisinopril (ZESTRIL) 2.5 MG tablet Take 1 tablet (2.5 mg total) by mouth daily. 10/11/20   [name], MD  metFORMIN (GLUCOPHAGE) 1000 MG tablet Take 1 tablet (1,000 mg total) by mouth 2 (two) times daily with a meal. 10/11/20   [name], MD  Multiple Vitamin (MULTIVITAMIN WITH MINERALS) TABS tablet Take 1 tablet by mouth daily.    [provider]  sitaGLIPtin (JANUVIA) 100 MG tablet Take 1 tablet (100 mg total) by mouth daily. 10/11/20   [name], MD  traZODone (DESYREL) 100 MG tablet Take 1 tablet (100 mg total) by mouth at bedtime. 10/11/20   [name], MD    Physical Exam: Vitals:   11/07/20 2200 11/07/20 2215 11/07/20 2230 11/07/20 2249  BP: (!) 147/80 (!) 149/87 (!) 163/82   Pulse: 60 63 62   Resp: $Remo'18 19 17   'KOaUS$ Temp:    98.4 F (36.9 C)  TempSrc:    Rectal  SpO2: 96% 96% 96%   Weight:      Height:        Constitutional: NAD, calm  Eyes: PERTLA, lids and conjunctivae normal ENMT: Mucous membranes are moist. Posterior pharynx clear of any exudate or lesions.   Neck: normal, supple, no masses, no thyromegaly Respiratory:  no wheezing, no crackles. No accessory muscle use.  Cardiovascular: S1 & S2 heard, regular rate and rhythm. No extremity edema.   Abdomen: No distension, no tenderness, soft. Bowel sounds active.  Musculoskeletal: no clubbing / cyanosis. No joint deformity upper and lower extremities.   Skin: no significant rashes, lesions, ulcers.  Warm, dry, well-perfused. Neurologic: Left eye gaze palsy, CN II-XII otherwise grossly intact. Sensation intact. Strength 5/5 in all 4 limbs.  Psychiatric: Alert and oriented to person, place, and situation. Pleasant and cooperative.    Labs and Imaging on Admission: I have personally reviewed following labs and imaging studies  CBC: Recent Labs  Lab 11/07/20 1949 11/07/20 1958  WBC 7.0  --   NEUTROABS 4.5  --   HGB 14.8 14.6  HCT 43.2 43.0  MCV 90.9  --   PLT 251  --    Basic Metabolic Panel: Recent Labs  Lab 11/07/20 1949 11/07/20 1958  NA 138 141  K 3.9 3.9  CL 107 106  CO2 23  --   GLUCOSE 242* 233*  BUN 17 19  CREATININE 0.86 0.80  CALCIUM 8.5*  --    GFR: Estimated Creatinine Clearance: 83.2 mL/min (by C-G formula based on SCr of 0.8 mg/dL). Liver Function Tests: Recent Labs  Lab 11/07/20 1949  AST 20  ALT 17  ALKPHOS 72  BILITOT 1.4*  PROT 6.2*  ALBUMIN 3.7   No results for input(s): LIPASE, AMYLASE in the last 168 hours. No results for input(s): AMMONIA in the last 168 hours. Coagulation Profile: Recent Labs  Lab 11/07/20 1949  INR 1.1   Cardiac Enzymes: No results for input(s): CKTOTAL, CKMB, CKMBINDEX, TROPONINI in the last 168 hours. BNP (last 3 results) No results for input(s): PROBNP in the last 8760 hours. HbA1C: No results for input(s): HGBA1C in the last 72 hours. CBG: Recent Labs  Lab 11/07/20 1944  GLUCAP 221*   Lipid Profile: No results for input(s): CHOL, HDL, LDLCALC, TRIG, CHOLHDL, LDLDIRECT in the last 72 hours. Thyroid Function Tests: No results for input(s): TSH, T4TOTAL, FREET4, T3FREE, THYROIDAB in the last 72 hours. Anemia Panel: No results for input(s): VITAMINB12, FOLATE, FERRITIN, TIBC, IRON, RETICCTPCT in the last 72 hours. Urine analysis:    Component Value Date/Time   COLORURINE YELLOW 11/07/2020 2151   APPEARANCEUR HAZY (A) 11/07/2020 2151   LABSPEC >1.046 (H) 11/07/2020 2151   PHURINE 6.0 11/07/2020 2151    GLUCOSEU >=500 (A) 11/07/2020 2151   HGBUR SMALL (A) 11/07/2020 2151   BILIRUBINUR NEGATIVE 11/07/2020 2151   BILIRUBINUR negative 03/14/2020 1636   BILIRUBINUR neg 09/04/2014 1623   KETONESUR 5 (A) 11/07/2020 2151   PROTEINUR NEGATIVE 11/07/2020 2151   UROBILINOGEN 4.0 (A) 03/14/2020 1636   UROBILINOGEN 4.0 (H) 04/11/2015 0658   NITRITE NEGATIVE 11/07/2020 2151   LEUKOCYTESUR SMALL (A) 11/07/2020 2151   Sepsis Labs: $RemoveBefo'@[email or web address]'[id number]$ (procalcitonin:4,lacticidven:4) ) Recent Results (from the past 240 hour(s))  Resp Panel by RT-PCR (Flu A&B, Covid) Urine, Clean Catch     Status: None   Collection Time: 11/07/20 10:24 PM   Specimen: Urine, Clean Catch; Nasopharyngeal(NP) swabs in vial transport medium  Result Value Ref Range Status   SARS Coronavirus 2 by RT PCR NEGATIVE NEGATIVE Final    Comment: (NOTE) SARS-CoV-2 target nucleic acids are NOT DETECTED.  The SARS-CoV-2 RNA is generally detectable in upper respiratory specimens during the acute phase of infection. The lowest concentration of SARS-CoV-2 viral copies this assay can detect is 138 copies/mL. A negative result does not preclude SARS-Cov-2 infection and should not be used as the sole basis for treatment or other patient management decisions. A negative result may occur with  improper specimen collection/handling, submission of specimen other than nasopharyngeal swab, presence of viral mutation(s) within the areas targeted by this assay, and inadequate number of viral copies(<138 copies/mL). A negative result must be combined with clinical observations, patient history, and epidemiological information. The expected result is Negative.  Fact Sheet for Patients:  EntrepreneurPulse.com.au  Fact Sheet for Healthcare Providers:  IncredibleEmployment.be  This test is no t yet approved or cleared by the [place] FDA and  has been authorized for detection and/or diagnosis of SARS-CoV-2  by FDA under an Emergency Use Authorization (EUA). This EUA will remain  in effect (meaning this test can be used) for the duration of the COVID-19 declaration under Section 564(b)(1) of the Act, 21 U.S.C.section 360bbb-3(b)(1), unless the authorization is terminated  or revoked sooner.       Influenza A by PCR NEGATIVE NEGATIVE  Final   Influenza B by PCR NEGATIVE NEGATIVE Final    Comment: (NOTE) The Xpert Xpress SARS-CoV-2/FLU/RSV plus assay is intended as an aid in the diagnosis of influenza from Nasopharyngeal swab specimens and should not be used as a sole basis for treatment. Nasal washings and aspirates are unacceptable for Xpert Xpress SARS-CoV-2/FLU/RSV testing.  Fact Sheet for Patients: EntrepreneurPulse.com.au  Fact Sheet for Healthcare Providers: IncredibleEmployment.be  This test is not yet approved or cleared by the [place] FDA and has been authorized for detection and/or diagnosis of SARS-CoV-2 by FDA under an Emergency Use Authorization (EUA). This EUA will remain in effect (meaning this test can be used) for the duration of the COVID-19 declaration under Section 564(b)(1) of the Act, 21 U.S.C. section 360bbb-3(b)(1), unless the authorization is terminated or revoked.  Performed at [facility], [place] [address] [place], [place] [address]      Radiological Exams on Admission: CT Angio Head W or Wo Contrast  Result Date: 11/07/2020 CLINICAL DATA:  Initial evaluation for acute dizziness, blurry vision. EXAM: CT ANGIOGRAPHY HEAD AND NECK TECHNIQUE: Multidetector CT imaging of the head and neck was performed using the standard protocol during bolus administration of intravenous contrast. Multiplanar CT image reconstructions and MIPs were obtained to evaluate the vascular anatomy. Carotid stenosis measurements (when applicable) are obtained utilizing NASCET criteria, using the distal internal carotid diameter as the  denominator. CONTRAST:  101mL OMNIPAQUE IOHEXOL 350 MG/ML SOLN COMPARISON:  Prior study from 09/21/2019 FINDINGS: CT HEAD FINDINGS Brain: Generalized cerebral atrophy with mild chronic small vessel ischemic disease. Small remote left cerebellar infarct. No acute intracranial hemorrhage. No acute large vessel territory infarct. No mass lesion, mass effect or midline shift. No hydrocephalus or extra-axial fluid collection. Vascular: No hyperdense vessel. Calcified atherosclerosis at the skull base. Skull: 1.5 cm group well-circumscribed hypodense lesion at the left suboccipital scalp, likely a sebaceous cyst. Scalp soft tissues demonstrate no other acute finding. Calvarium intact. Sinuses: Chronic right maxillary sinusitis noted. Paranasal sinuses are otherwise clear. No mastoid effusion. Orbits: Globes orbital soft tissues within normal limits. CTA NECK FINDINGS Aortic arch: Visualized aortic arch normal in caliber with normal branch pattern. Mild atheromatous change about the aortic arch and origin of the great vessels without hemodynamically significant stenosis. Right carotid system: Right CCA patent from its origin to the bifurcation without stenosis. Mild scattered calcified plaque about the proximal right ICA without significant stenosis. Right ICA widely patent distally without stenosis, dissection or occlusion. Left carotid system: Left CCA patent from its origin to the bifurcation without stenosis. Mild atheromatous plaque about the left carotid bulb/proximal left ICA without significant stenosis. Left ICA widely patent distally without stenosis, dissection or occlusion. Vertebral arteries: Both vertebral arteries arise from the subclavian arteries. No proximal subclavian artery stenosis. Left vertebral artery dominant, with a diffusely hypoplastic right vertebral artery. Mixed plaque at the origin of the left vertebral artery with associated severe ostial stenosis (series 11, image 183). Vertebral arteries  otherwise patent without stenosis, dissection or occlusion. Skeleton: No visible acute osseous finding. No discrete or worrisome osseous lesions. Other neck: No other acute soft tissue abnormality within the neck. No mass or adenopathy. Upper chest: Visualized upper chest demonstrates no acute finding. Partially visualized lungs are grossly clear. Review of the MIP images confirms the above findings CTA HEAD FINDINGS Anterior circulation: Petrous segments patent bilaterally. Scattered atheromatous plaque within the carotid siphons bilaterally. Associated short-segment moderate approximate 50% stenosis at the cavernous right ICA. No significant stenosis  about the contralateral left ICA. A1 segments patent bilaterally. Normal anterior communicating artery complex. Anterior cerebral arteries patent to their distal aspects without stenosis. No M1 stenosis or occlusion. Normal MCA bifurcations. Distal MCA branches well perfused and symmetric. Scattered small vessel atheromatous irregularity noted. Posterior circulation: Dominant left V4 segment tortuous but patent to the vertebrobasilar junction without stenosis. Hypoplastic right vertebral artery patent as well. Left PICA origin patent and normal. Right PICA not seen. Basilar diminutive patent to its distal aspect without stenosis. Superior cerebral arteries patent bilaterally. Predominant fetal type origin of the PCAs supplied via a robust bilateral posterior communicating arteries. Small hypoplastic P1 segments are noted, with a focal severe stenosis at the hypoplastic left P1 segment (series 13, image 29). Left PCA well perfused to its distal aspect. There is age indeterminate occlusion of a distal right P3 segment (series 15, image 22). Right PCA otherwise perfused to its distal aspect. Venous sinuses: Grossly patent allowing for timing the contrast bolus. Anatomic variants: Prominent fetal type origin of the PCAs with overall diminutive vertebrobasilar system.  Review of the MIP images confirms the above findings IMPRESSION: CT HEAD IMPRESSION: 1. No acute intracranial abnormality. 2. Generalized cerebral atrophy with mild chronic small vessel ischemic disease. 3. Chronic right maxillary sinusitis. 4. 1.5 cm probable sebaceous cyst at the left suboccipital scalp. Correlation with physical exam recommended. CTA HEAD AND NECK IMPRESSION: 1. 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 No signs of evolving ischemia on corresponding noncontrast head CT at this time. Finding could be further assessed with dedicated brain MRI as clinically warranted. 2. Atheromatous plaque at the origin of the dominant left vertebral artery with associated severe ostial stenosis. 3. Predominant fetal type origin of the PCAs with overall diminutive vertebrobasilar system. Electronically Signed   By: Jeannine Boga M.D.   On: 11/07/2020 21:36   CT Angio Neck W and/or Wo Contrast  Result Date: 11/07/2020 CLINICAL DATA:  Initial evaluation for acute dizziness, blurry vision. EXAM: CT ANGIOGRAPHY HEAD AND NECK TECHNIQUE: Multidetector CT imaging of the head and neck was performed using the standard protocol during bolus administration of intravenous contrast. Multiplanar CT image reconstructions and MIPs were obtained to evaluate the vascular anatomy. Carotid stenosis measurements (when applicable) are obtained utilizing NASCET criteria, using the distal internal carotid diameter as the denominator. CONTRAST:  35mL OMNIPAQUE IOHEXOL 350 MG/ML SOLN COMPARISON:  Prior study from 09/21/2019 FINDINGS: CT HEAD FINDINGS Brain: Generalized cerebral atrophy with mild chronic small vessel ischemic disease. Small remote left cerebellar infarct. No acute intracranial hemorrhage. No acute large vessel territory infarct. No mass lesion, mass effect or midline shift. No hydrocephalus or extra-axial fluid collection. Vascular: No hyperdense vessel. Calcified atherosclerosis at the skull base. Skull: 1.5 cm  group well-circumscribed hypodense lesion at the left suboccipital scalp, likely a sebaceous cyst. Scalp soft tissues demonstrate no other acute finding. Calvarium intact. Sinuses: Chronic right maxillary sinusitis noted. Paranasal sinuses are otherwise clear. No mastoid effusion. Orbits: Globes orbital soft tissues within normal limits. CTA NECK FINDINGS Aortic arch: Visualized aortic arch normal in caliber with normal branch pattern. Mild atheromatous change about the aortic arch and origin of the great vessels without hemodynamically significant stenosis. Right carotid system: Right CCA patent from its origin to the bifurcation without stenosis. Mild scattered calcified plaque about the proximal right ICA without significant stenosis. Right ICA widely patent distally without stenosis, dissection or occlusion. Left carotid system: Left CCA patent from its origin to the bifurcation without stenosis. Mild atheromatous plaque about the  left carotid bulb/proximal left ICA without significant stenosis. Left ICA widely patent distally without stenosis, dissection or occlusion. Vertebral arteries: Both vertebral arteries arise from the subclavian arteries. No proximal subclavian artery stenosis. Left vertebral artery dominant, with a diffusely hypoplastic right vertebral artery. Mixed plaque at the origin of the left vertebral artery with associated severe ostial stenosis (series 11, image 183). Vertebral arteries otherwise patent without stenosis, dissection or occlusion. Skeleton: No visible acute osseous finding. No discrete or worrisome osseous lesions. Other neck: No other acute soft tissue abnormality within the neck. No mass or adenopathy. Upper chest: Visualized upper chest demonstrates no acute finding. Partially visualized lungs are grossly clear. Review of the MIP images confirms the above findings CTA HEAD FINDINGS Anterior circulation: Petrous segments patent bilaterally. Scattered atheromatous plaque within  the carotid siphons bilaterally. Associated short-segment moderate approximate 50% stenosis at the cavernous right ICA. No significant stenosis about the contralateral left ICA. A1 segments patent bilaterally. Normal anterior communicating artery complex. Anterior cerebral arteries patent to their distal aspects without stenosis. No M1 stenosis or occlusion. Normal MCA bifurcations. Distal MCA branches well perfused and symmetric. Scattered small vessel atheromatous irregularity noted. Posterior circulation: Dominant left V4 segment tortuous but patent to the vertebrobasilar junction without stenosis. Hypoplastic right vertebral artery patent as well. Left PICA origin patent and normal. Right PICA not seen. Basilar diminutive patent to its distal aspect without stenosis. Superior cerebral arteries patent bilaterally. Predominant fetal type origin of the PCAs supplied via a robust bilateral posterior communicating arteries. Small hypoplastic P1 segments are noted, with a focal severe stenosis at the hypoplastic left P1 segment (series 13, image 29). Left PCA well perfused to its distal aspect. There is age indeterminate occlusion of a distal right P3 segment (series 15, image 22). Right PCA otherwise perfused to its distal aspect. Venous sinuses: Grossly patent allowing for timing the contrast bolus. Anatomic variants: Prominent fetal type origin of the PCAs with overall diminutive vertebrobasilar system. Review of the MIP images confirms the above findings IMPRESSION: CT HEAD IMPRESSION: 1. No acute intracranial abnormality. 2. Generalized cerebral atrophy with mild chronic small vessel ischemic disease. 3. Chronic right maxillary sinusitis. 4. 1.5 cm probable sebaceous cyst at the left suboccipital scalp. Correlation with physical exam recommended. CTA HEAD AND NECK IMPRESSION: 1. 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 No signs of evolving ischemia on corresponding noncontrast head CT at this time. Finding  could be further assessed with dedicated brain MRI as clinically warranted. 2. Atheromatous plaque at the origin of the dominant left vertebral artery with associated severe ostial stenosis. 3. Predominant fetal type origin of the PCAs with overall diminutive vertebrobasilar system. Electronically Signed   By: Jeannine Boga M.D.   On: 11/07/2020 21:36   DG Chest Portable 1 View  Result Date: 11/07/2020 CLINICAL DATA:  Weakness, blurred/double vision since 1600 hours today EXAM: PORTABLE CHEST 1 VIEW COMPARISON:  Portable exam 2017 hours compared to 07/19/2020 FINDINGS: Normal heart size, mediastinal contours, and pulmonary vascularity. Atherosclerotic calcification aorta. Lungs clear. No infiltrate, pleural effusion or pneumothorax. Bones demineralized. IMPRESSION: No acute abnormalities. Electronically Signed   By: Lavonia Dana M.D.   On: 11/07/2020 20:58    EKG: Independently reviewed. Sinus rhythm.   Assessment/Plan   1. Vertigo  - Presents with acute-onset vertigo with inability to stand unassisted, had no acute findings on head CT, and CTA head & neck with age-indeterminate occlusion involving distal right P3 without signs evolving infarction  - Check MRI brain, ASA 325  mg, continue symptomatic care for now     2. Insulin-dependent DM  - A1c was 8.8% two weeks ago  - Continue CBG checks and insulin    3. Hypertension  - BP modestly elevated in ED  - Hold antihypertensives pending MRI results     DVT prophylaxis: Lovenox  Code Status: Full  Level of Care: Level of care: Telemetry Medical Family Communication: Sister updated at bedside Disposition Plan:  Patient is from: Home  Anticipated d/c is to: home Anticipated d/c date is: 11/09/20 Patient currently: Pending MRI, ability to walk unassisted or PT eval  Consults called: none  Admission status: Observation     Vianne Bulls, MD [facility]  11/08/2020, 12:47 AM

## 2020-11-09 ENCOUNTER — Encounter: Payer: Self-pay | Admitting: *Deleted

## 2020-11-09 ENCOUNTER — Other Ambulatory Visit: Payer: Self-pay | Admitting: Student

## 2020-11-09 DIAGNOSIS — R42 Dizziness and giddiness: Secondary | ICD-10-CM

## 2020-11-09 DIAGNOSIS — I10 Essential (primary) hypertension: Secondary | ICD-10-CM | POA: Diagnosis not present

## 2020-11-09 LAB — GLUCOSE, CAPILLARY
Glucose-Capillary: 208 mg/dL — ABNORMAL HIGH (ref 70–99)
Glucose-Capillary: 145 mg/dL — ABNORMAL HIGH (ref 70–99)

## 2020-11-09 MED ORDER — ASPIRIN 81 MG PO TBEC: 81.0000 mg | DELAYED_RELEASE_TABLET | Freq: Every day | ORAL | 1 refills | Status: AC

## 2020-11-09 NOTE — Discharge Summary (Signed)
Physician Discharge Summary  William Oneill UVO:[id number] DOB: Dec 15, 1945  PCP: [name], MD  Admitted from: Home Discharged to: Home  Admit date: 11/07/2020 Discharge date: 11/09/2020  Recommendations for Outpatient Follow-up:    Follow-up Information    [name], MD. Schedule an appointment as soon as possible for a visit in 1 week(s).   Specialties: Family Medicine, Sports Medicine Why: To be seen with labs (CBC & BMP). Need better DIabetes control. May consider Neurology and Opthalmology consultation as outpatient. Contact information: [place] [address] [phone]        [facility] Follow up.   Specialty: Home Health Services Why: The home health agency will contact you for the first home visit. Contact information: [place] [address] [phone]                Home Health:  Home Health Orders (From admission, onward)    Start     Ordered   11/09/20 [place]  At discharge       Question Answer Comment  To provide the following care/treatments PT   To provide the following care/treatments OT      11/09/20 1147           Equipment/Devices: None.  Patient has walker at home.    Discharge Condition: Improved and stable   Code Status: Full Code Diet recommendation:  Discharge Diet Orders (From admission, onward)    Start     Ordered   11/09/20 0000  Diet - low sodium heart healthy        11/09/20 1147   11/09/20 0000  Diet Carb Modified        11/09/20 1147           Discharge Diagnoses:  Principal Problem:   Vertigo Active Problems:   Type 2 diabetes mellitus without complication (HCC)   Essential hypertension   Brief Summary: 75 year old male, lives with his sister, independent and functional, medical history significant for but not limited to type II DM (no longer on insulins), hypertension, colon cancer s/p colectomy and chemotherapy, reported history of  A. fib many years ago without recurrence and not on anticoagulation, presented to the ED via EMS for evaluation of dizziness, near syncope, vertigo and transient diplopia.  Symptoms were severe enough where he was unable to ambulate well.  MRI brain negative for acute process.  Noted incomplete abduction of left eye and consulted Neurology for assistance.   Assessment & Plan:   Vertigo, possibly BPPV  MRI negative for acute stroke.  Had some nystagmus which could be compensatory from left 3rd nerve palsy but there was also a rotational component that could be due to peripheral vertigo.  Neurology consultation appreciated.  Discussed with Dr. Rory Percy on 4/7, agrees with vestibular rehab by PT for peripheral vertigo, as needed meclizine and may even consider diazepam if needed.  PT evaluated yesterday and had recommended SNF but on reevaluation today, discussing with patient's sister, have recommended home health with PT, OT and sister is able to provide 24/7 assistance and supervision.  Vertigo has resolved.  Recommended Dramamine OTC as needed and if symptoms persist then could consider starting meclizine as needed.  Would like to avoid benzodiazepines due to advanced age and fall risk.  Sister in agreement with this plan.  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  Etiology likely DM and not likely to be aneurysm by CTA was unremarkable.  DR. Rory Percy recommends 30-day cardiac monitor to evaluate for paroxysmal A. fib-I have contacted [facility] card master for same on 4/7.  Strict DM control, eyepatch if has issues again with diplopia and aspirin 81 mg daily.  Outpatient neurology follow-up if continues to have dizziness or neurological symptoms.  May  consider outpatient ophthalmology consultation but unsure how much they will be able to add to his management.  Patient and sister were counseled that patient should not drive until he is cleared to do so by his physician during outpatient follow-up.  They verbalized understanding.  This was done in the presence of the therapy team.  Type II DM, uncontrolled  A1c 8.8 on 10/11/2020  Patient and sister clearly indicate that he has not been on Lantus PTA.  He only takes Metformin and Januvia.  Continue same.  Counseled both of them extensively for close follow-up with PCP to adjust meds for much better control of poorly controlled DM.  They verbalized understanding.    Hyperlipidemia  Continue statins.  Essential hypertension  Reasonable inpatient control.  Per patient and sister, patient not on lisinopril PTA.  Outpatient follow-up with PCP  Suspected BPH  They confirm that patient takes both Myrbetriq and Flomax and follows with outpatient urology.  Isolated hyperbilirubinemia  Unclear etiology.  Asymptomatic.  Appears to be chronic and has had this in labs January 2022 and even September 2021.  Outpatient follow-up.  Body mass index is 29.54 kg/m.    Consultants:   Neurology  Procedures:   None    Discharge Instructions  Discharge Instructions    Call MD for:   Complete by: As directed    Recurrent vertigo/place spinning sensation or strokelike symptoms.   Call MD for:  extreme fatigue   Complete by: As directed    Call MD for:  persistant dizziness or light-headedness   Complete by: As directed    Diet - low sodium heart healthy   Complete by: As directed    Diet Carb Modified   Complete by: As directed    Increase activity slowly   Complete by: As directed        Medication List    STOP taking these medications   guaiFENesin-dextromethorphan 100-10 MG/5ML syrup Commonly known as: ROBITUSSIN DM   Lantus SoloStar 100 UNIT/ML Solostar  Pen Generic drug: insulin glargine   lisinopril 2.5 MG tablet Commonly known as: Zestril   Pen Needles 32G X 4 MM Misc     TAKE these medications   accu-chek soft touch lancets Use as instructed   aspirin 81 MG EC tablet Take 1 tablet (81 mg total) by mouth daily. Swallow whole. Start taking on: November 10, 2020   atorvastatin 10 MG tablet Commonly known as: LIPITOR Take 1 tablet (10 mg total) by mouth daily.   blood glucose meter kit and supplies Dispense based on patient and insurance preference. Use up to four times daily as directed. (FOR ICD-9 250.00, 250.01).   glucose blood test strip Commonly known as: ONE TOUCH ULTRA TEST USE AS DIRECTED TO TEST UP TO 4 TIMES A DAY   metFORMIN 1000 MG tablet Commonly known as: GLUCOPHAGE Take 1 tablet (1,000 mg total) by mouth 2 (two) times daily with a  meal.   multivitamin with minerals Tabs tablet Take 1 tablet by mouth daily.   Myrbetriq 25 MG Tb24 tablet Generic drug: mirabegron ER Take 25 mg by mouth daily.   sitaGLIPtin 100 MG tablet Commonly known as: Januvia Take 1 tablet (100 mg total) by mouth daily.   tamsulosin 0.4 MG Caps capsule Commonly known as: FLOMAX Take 0.8 mg by mouth daily.   traZODone 100 MG tablet Commonly known as: DESYREL Take 1 tablet (100 mg total) by mouth at bedtime.      No Known Allergies    Procedures/Studies: CT Angio Head W or Wo Contrast  Result Date: 11/07/2020 CLINICAL DATA:  Initial evaluation for acute dizziness, blurry vision. EXAM: CT ANGIOGRAPHY HEAD AND NECK TECHNIQUE: Multidetector CT imaging of the head and neck was performed using the standard protocol during bolus administration of intravenous contrast. Multiplanar CT image reconstructions and MIPs were obtained to evaluate the vascular anatomy. Carotid stenosis measurements (when applicable) are obtained utilizing NASCET criteria, using the distal internal carotid diameter as the denominator. CONTRAST:  66m OMNIPAQUE  IOHEXOL 350 MG/ML SOLN COMPARISON:  Prior study from 09/21/2019 FINDINGS: CT HEAD FINDINGS Brain: Generalized cerebral atrophy with mild chronic small vessel ischemic disease. Small remote left cerebellar infarct. No acute intracranial hemorrhage. No acute large vessel territory infarct. No mass lesion, mass effect or midline shift. No hydrocephalus or extra-axial fluid collection. Vascular: No hyperdense vessel. Calcified atherosclerosis at the skull base. Skull: 1.5 cm group well-circumscribed hypodense lesion at the left suboccipital scalp, likely a sebaceous cyst. Scalp soft tissues demonstrate no other acute finding. Calvarium intact. Sinuses: Chronic right maxillary sinusitis noted. Paranasal sinuses are otherwise clear. No mastoid effusion. Orbits: Globes orbital soft tissues within normal limits. CTA NECK FINDINGS Aortic arch: Visualized aortic arch normal in caliber with normal branch pattern. Mild atheromatous change about the aortic arch and origin of the great vessels without hemodynamically significant stenosis. Right carotid system: Right CCA patent from its origin to the bifurcation without stenosis. Mild scattered calcified plaque about the proximal right ICA without significant stenosis. Right ICA widely patent distally without stenosis, dissection or occlusion. Left carotid system: Left CCA patent from its origin to the bifurcation without stenosis. Mild atheromatous plaque about the left carotid bulb/proximal left ICA without significant stenosis. Left ICA widely patent distally without stenosis, dissection or occlusion. Vertebral arteries: Both vertebral arteries arise from the subclavian arteries. No proximal subclavian artery stenosis. Left vertebral artery dominant, with a diffusely hypoplastic right vertebral artery. Mixed plaque at the origin of the left vertebral artery with associated severe ostial stenosis (series 11, image 183). Vertebral arteries otherwise patent without stenosis,  dissection or occlusion. Skeleton: No visible acute osseous finding. No discrete or worrisome osseous lesions. Other neck: No other acute soft tissue abnormality within the neck. No mass or adenopathy. Upper chest: Visualized upper chest demonstrates no acute finding. Partially visualized lungs are grossly clear. Review of the MIP images confirms the above findings CTA HEAD FINDINGS Anterior circulation: Petrous segments patent bilaterally. Scattered atheromatous plaque within the carotid siphons bilaterally. Associated short-segment moderate approximate 50% stenosis at the cavernous right ICA. No significant stenosis about the contralateral left ICA. A1 segments patent bilaterally. Normal anterior communicating artery complex. Anterior cerebral arteries patent to their distal aspects without stenosis. No M1 stenosis or occlusion. Normal MCA bifurcations. Distal MCA branches well perfused and symmetric. Scattered small vessel atheromatous irregularity noted. Posterior circulation: Dominant left V4 segment tortuous but patent to the vertebrobasilar junction without stenosis. Hypoplastic right  vertebral artery patent as well. Left PICA origin patent and normal. Right PICA not seen. Basilar diminutive patent to its distal aspect without stenosis. Superior cerebral arteries patent bilaterally. Predominant fetal type origin of the PCAs supplied via a robust bilateral posterior communicating arteries. Small hypoplastic P1 segments are noted, with a focal severe stenosis at the hypoplastic left P1 segment (series 13, image 29). Left PCA well perfused to its distal aspect. There is age indeterminate occlusion of a distal right P3 segment (series 15, image 22). Right PCA otherwise perfused to its distal aspect. Venous sinuses: Grossly patent allowing for timing the contrast bolus. Anatomic variants: Prominent fetal type origin of the PCAs with overall diminutive vertebrobasilar system. Review of the MIP images confirms the  above findings IMPRESSION: CT HEAD IMPRESSION: 1. No acute intracranial abnormality. 2. Generalized cerebral atrophy with mild chronic small vessel ischemic disease. 3. Chronic right maxillary sinusitis. 4. 1.5 cm probable sebaceous cyst at the left suboccipital scalp. Correlation with physical exam recommended. CTA HEAD AND NECK IMPRESSION: 1. 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 No signs of evolving ischemia on corresponding noncontrast head CT at this time. Finding could be further assessed with dedicated brain MRI as clinically warranted. 2. Atheromatous plaque at the origin of the dominant left vertebral artery with associated severe ostial stenosis. 3. Predominant fetal type origin of the PCAs with overall diminutive vertebrobasilar system. Electronically Signed   By: Jeannine Boga M.D.   On: 11/07/2020 21:36   CT Angio Neck W and/or Wo Contrast  Result Date: 11/07/2020 CLINICAL DATA:  Initial evaluation for acute dizziness, blurry vision. EXAM: CT ANGIOGRAPHY HEAD AND NECK TECHNIQUE: Multidetector CT imaging of the head and neck was performed using the standard protocol during bolus administration of intravenous contrast. Multiplanar CT image reconstructions and MIPs were obtained to evaluate the vascular anatomy. Carotid stenosis measurements (when applicable) are obtained utilizing NASCET criteria, using the distal internal carotid diameter as the denominator. CONTRAST:  52m OMNIPAQUE IOHEXOL 350 MG/ML SOLN COMPARISON:  Prior study from 09/21/2019 FINDINGS: CT HEAD FINDINGS Brain: Generalized cerebral atrophy with mild chronic small vessel ischemic disease. Small remote left cerebellar infarct. No acute intracranial hemorrhage. No acute large vessel territory infarct. No mass lesion, mass effect or midline shift. No hydrocephalus or extra-axial fluid collection. Vascular: No hyperdense vessel. Calcified atherosclerosis at the skull base. Skull: 1.5 cm group well-circumscribed hypodense  lesion at the left suboccipital scalp, likely a sebaceous cyst. Scalp soft tissues demonstrate no other acute finding. Calvarium intact. Sinuses: Chronic right maxillary sinusitis noted. Paranasal sinuses are otherwise clear. No mastoid effusion. Orbits: Globes orbital soft tissues within normal limits. CTA NECK FINDINGS Aortic arch: Visualized aortic arch normal in caliber with normal branch pattern. Mild atheromatous change about the aortic arch and origin of the great vessels without hemodynamically significant stenosis. Right carotid system: Right CCA patent from its origin to the bifurcation without stenosis. Mild scattered calcified plaque about the proximal right ICA without significant stenosis. Right ICA widely patent distally without stenosis, dissection or occlusion. Left carotid system: Left CCA patent from its origin to the bifurcation without stenosis. Mild atheromatous plaque about the left carotid bulb/proximal left ICA without significant stenosis. Left ICA widely patent distally without stenosis, dissection or occlusion. Vertebral arteries: Both vertebral arteries arise from the subclavian arteries. No proximal subclavian artery stenosis. Left vertebral artery dominant, with a diffusely hypoplastic right vertebral artery. Mixed plaque at the origin of the left vertebral artery with associated severe ostial stenosis (series 11, image  183). Vertebral arteries otherwise patent without stenosis, dissection or occlusion. Skeleton: No visible acute osseous finding. No discrete or worrisome osseous lesions. Other neck: No other acute soft tissue abnormality within the neck. No mass or adenopathy. Upper chest: Visualized upper chest demonstrates no acute finding. Partially visualized lungs are grossly clear. Review of the MIP images confirms the above findings CTA HEAD FINDINGS Anterior circulation: Petrous segments patent bilaterally. Scattered atheromatous plaque within the carotid siphons bilaterally.  Associated short-segment moderate approximate 50% stenosis at the cavernous right ICA. No significant stenosis about the contralateral left ICA. A1 segments patent bilaterally. Normal anterior communicating artery complex. Anterior cerebral arteries patent to their distal aspects without stenosis. No M1 stenosis or occlusion. Normal MCA bifurcations. Distal MCA branches well perfused and symmetric. Scattered small vessel atheromatous irregularity noted. Posterior circulation: Dominant left V4 segment tortuous but patent to the vertebrobasilar junction without stenosis. Hypoplastic right vertebral artery patent as well. Left PICA origin patent and normal. Right PICA not seen. Basilar diminutive patent to its distal aspect without stenosis. Superior cerebral arteries patent bilaterally. Predominant fetal type origin of the PCAs supplied via a robust bilateral posterior communicating arteries. Small hypoplastic P1 segments are noted, with a focal severe stenosis at the hypoplastic left P1 segment (series 13, image 29). Left PCA well perfused to its distal aspect. There is age indeterminate occlusion of a distal right P3 segment (series 15, image 22). Right PCA otherwise perfused to its distal aspect. Venous sinuses: Grossly patent allowing for timing the contrast bolus. Anatomic variants: Prominent fetal type origin of the PCAs with overall diminutive vertebrobasilar system. Review of the MIP images confirms the above findings IMPRESSION: CT HEAD IMPRESSION: 1. No acute intracranial abnormality. 2. Generalized cerebral atrophy with mild chronic small vessel ischemic disease. 3. Chronic right maxillary sinusitis. 4. 1.5 cm probable sebaceous cyst at the left suboccipital scalp. Correlation with physical exam recommended. CTA HEAD AND NECK IMPRESSION: 1. 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 No signs of evolving ischemia on corresponding noncontrast head CT at this time. Finding could be further assessed with  dedicated brain MRI as clinically warranted. 2. Atheromatous plaque at the origin of the dominant left vertebral artery with associated severe ostial stenosis. 3. Predominant fetal type origin of the PCAs with overall diminutive vertebrobasilar system. Electronically Signed   By: Jeannine Boga M.D.   On: 11/07/2020 21:36   MR BRAIN WO CONTRAST  Result Date: 11/08/2020 CLINICAL DATA:  Initial evaluation for neuro deficit, stroke suspected. EXAM: MRI HEAD WITHOUT CONTRAST TECHNIQUE: Multiplanar, multiecho pulse sequences of the brain and surrounding structures were obtained without intravenous contrast. COMPARISON:  Prior CTA from earlier the same day. FINDINGS: Brain: Generalized age-related cerebral atrophy. Mild chronic microvascular ischemic disease noted involving the periventricular and deep white matter both cerebral hemispheres. Few small remote bilateral cerebellar infarcts noted. Small focus of associated chronic hemosiderin staining noted at the left cerebellum. No abnormal foci of restricted diffusion to suggest acute or subacute ischemia. Gray-white matter differentiation maintained. No encephalomalacia to suggest chronic cortical infarction elsewhere within the brain. No other evidence for acute or chronic intracranial hemorrhage. No mass lesion, midline shift or mass effect. No hydrocephalus or extra-axial fluid collection. Pituitary gland and suprasellar region within normal limits. Midline structures intact. Vascular: Hypoplastic right vertebral artery not well seen. Major intracranial vascular flow voids are otherwise maintained. Skull and upper cervical spine: Craniocervical junction within normal limits. Bone marrow signal intensity normal. Probable sebaceous cyst noted at the left suboccipital scalp. Scalp  soft tissues otherwise unremarkable. Sinuses/Orbits: Globes and orbital soft tissues demonstrate no acute finding. Chronic right maxillary sinusitis. Additional scattered mucosal  thickening throughout the remainder of the paranasal sinuses. Trace bilateral mastoid effusions, of doubtful significance. Inner ear structures grossly normal. Visualized nasopharynx within normal limits. Other: None. IMPRESSION: 1. No acute intracranial abnormality. 2. Few small remote bilateral cerebellar infarcts. 3. Underlying mild chronic microvascular ischemic disease. 4. Chronic right maxillary sinusitis. Electronically Signed   By: Jeannine Boga M.D.   On: 11/08/2020 01:05   DG Chest Portable 1 View  Result Date: 11/07/2020 CLINICAL DATA:  Weakness, blurred/double vision since 1600 hours today EXAM: PORTABLE CHEST 1 VIEW COMPARISON:  Portable exam 2017 hours compared to 07/19/2020 FINDINGS: Normal heart size, mediastinal contours, and pulmonary vascularity. Atherosclerotic calcification aorta. Lungs clear. No infiltrate, pleural effusion or pneumothorax. Bones demineralized. IMPRESSION: No acute abnormalities. Electronically Signed   By: Lavonia Dana M.D.   On: 11/07/2020 20:58      Subjective: Patient interviewed and examined along with his sister, PT and OT at bedside.  I saw him walking with a walker in the halls along with PT, OT supervision and assistance.  As per therapies, patient has tendency to lean to the right and also issues.  He had some unsteadiness of gait but much improved compared to yesterday.  Sister clearly stated that they did not want SNF and she can provide the 24/7 supervision and assistance for him.  Patient reports feeling much better.  Both of them feel that he is back to his baseline.  No dizziness, vertigo, diplopia or any other complaints  Discharge Exam:  Vitals:   11/09/20 0334 11/09/20 0758 11/09/20 0800 11/09/20 1117  BP: (!) 145/78 139/69 (!) 142/75 (!) 159/78  Pulse: (!) 58 (!) 55 60 64  Resp: _0 Temp: 97.6 F (36.4 C) 97.8 F (36.6 C) 97.6 F (36.4 C) 98 F (36.7 C)  TempSrc: Oral Oral Oral Oral  SpO2: 97% 98% 97% 98%  Weight:       Height:         General exam: Elderly male, moderately built and nourished sitting up comfortably in chair after working with PT. Respiratory system: Clear to auscultation. Respiratory effort normal. Cardiovascular system: S1 & S2 heard, RRR. No JVD, murmurs, rubs, gallops or clicks. No pedal edema.  T telemetry personally reviewed: SB in the 50s-SR in the 60s Gastrointestinal system: Abdomen is nondistended, soft and nontender. No organomegaly or masses felt. Normal bowel sounds heard. Central nervous system: Alert and oriented. No facial asymmetry.  Although mild dysarthria noted by patient's RN and neurology, this is his baseline per patient and his sister.  Disconjugate gaze with left eye slightly deviated to the left and unable to adduct left eye beyond midline.  Mild rotatory nystagmus in all fields-did not notice it much today. Extremities: Symmetric 5 x 5 power. Skin: No rashes, lesions or ulcers Psychiatry: Judgement and insight somewhat impaired. Mood & affect appropriate.      The results of significant diagnostics from this hospitalization (including imaging, microbiology, ancillary and laboratory) are listed below for reference.     Microbiology: Recent Results (from the past 240 hour(s))  Resp Panel by RT-PCR (Flu A&B, Covid) Urine, Clean Catch     Status: None   Collection Time: 11/07/20 10:24 PM   Specimen: Urine, Clean Catch; Nasopharyngeal(NP) swabs in vial transport medium  Result Value Ref Range Status   SARS Coronavirus 2 by RT PCR NEGATIVE  NEGATIVE Final    Comment: (NOTE) SARS-CoV-2 target nucleic acids are NOT DETECTED.  The SARS-CoV-2 RNA is generally detectable in upper respiratory specimens during the acute phase of infection. The lowest concentration of SARS-CoV-2 viral copies this assay can detect is 138 copies/mL. A negative result does not preclude SARS-Cov-2 infection and should not be used as the sole basis for treatment or other patient  management decisions. A negative result may occur with  improper specimen collection/handling, submission of specimen other than nasopharyngeal swab, presence of viral mutation(s) within the areas targeted by this assay, and inadequate number of viral copies(<138 copies/mL). A negative result must be combined with clinical observations, patient history, and epidemiological information. The expected result is Negative.  Fact Sheet for Patients:  EntrepreneurPulse.com.au  Fact Sheet for Healthcare Providers:  IncredibleEmployment.be  This test is no t yet approved or cleared by the [place] FDA and  has been authorized for detection and/or diagnosis of SARS-CoV-2 by FDA under an Emergency Use Authorization (EUA). This EUA will remain  in effect (meaning this test can be used) for the duration of the COVID-19 declaration under Section 564(b)(1) of the Act, 21 U.S.C.section 360bbb-3(b)(1), unless the authorization is terminated  or revoked sooner.       Influenza A by PCR NEGATIVE NEGATIVE Final   Influenza B by PCR NEGATIVE NEGATIVE Final    Comment: (NOTE) The Xpert Xpress SARS-CoV-2/FLU/RSV plus assay is intended as an aid in the diagnosis of influenza from Nasopharyngeal swab specimens and should not be used as a sole basis for treatment. Nasal washings and aspirates are unacceptable for Xpert Xpress SARS-CoV-2/FLU/RSV testing.  Fact Sheet for Patients: EntrepreneurPulse.com.au  Fact Sheet for Healthcare Providers: IncredibleEmployment.be  This test is not yet approved or cleared by the [place] FDA and has been authorized for detection and/or diagnosis of SARS-CoV-2 by FDA under an Emergency Use Authorization (EUA). This EUA will remain in effect (meaning this test can be used) for the duration of the COVID-19 declaration under Section 564(b)(1) of the Act, 21 U.S.C. section 360bbb-3(b)(1),  unless the authorization is terminated or revoked.  Performed at [facility], [place] [address], [place], [place] [address]      Labs: CBC: Recent Labs  Lab 11/07/20 1949 11/07/20 1958 11/08/20 0331  WBC 7.0  --  6.9  NEUTROABS 4.5  --   --   HGB 14.8 14.6 14.0  HCT 43.2 43.0 40.4  MCV 90.9  --  92.0  PLT 251  --  007    Basic Metabolic Panel: Recent Labs  Lab 11/07/20 1949 11/07/20 1958 11/08/20 0331  NA 138 141 138  K 3.9 3.9 3.6  CL 107 106 107  CO2 23  --  26  GLUCOSE 242* 233* 267*  BUN _0 CREATININE 0.86 0.80 0.92  CALCIUM 8.5*  --  8.2*    Liver Function Tests: Recent Labs  Lab 11/07/20 1949 11/08/20 0331  AST 20 20  ALT 17 17  ALKPHOS 72 65  BILITOT 1.4* 1.8*  PROT 6.2* 5.8*  ALBUMIN 3.7 3.3*    CBG: Recent Labs  Lab 11/08/20 1136 11/08/20 1552 11/08/20 2125 11/09/20 0651 11/09/20 1218  GLUCAP 236* 139* 183* 208* 145*    Lipid Profile Recent Labs    11/08/20 0331  CHOL 179  HDL 49  LDLCALC 115*  TRIG 77  CHOLHDL 3.7    Urinalysis    Component Value Date/Time   COLORURINE YELLOW 11/07/2020 2151  APPEARANCEUR HAZY (A) 11/07/2020 2151   LABSPEC >1.046 (H) 11/07/2020 2151   PHURINE 6.0 11/07/2020 2151   GLUCOSEU >=500 (A) 11/07/2020 2151   HGBUR SMALL (A) 11/07/2020 2151   BILIRUBINUR NEGATIVE 11/07/2020 2151   BILIRUBINUR negative 03/14/2020 1636   BILIRUBINUR neg 09/04/2014 1623   KETONESUR 5 (A) 11/07/2020 2151   PROTEINUR NEGATIVE 11/07/2020 2151   UROBILINOGEN 4.0 (A) 03/14/2020 1636   UROBILINOGEN 4.0 (H) 04/11/2015 0658   NITRITE NEGATIVE 11/07/2020 2151   LEUKOCYTESUR SMALL (A) 11/07/2020 2151    Discussed in detail with patient's sister at bedside, updated care and answered all questions.  Time coordinating discharge: 35 minutes  SIGNED:  Vernell Leep, MD, [place], [facility]. [facility]  To contact the attending provider between 7A-7P or the covering provider during after hours 7P-7A,  please log into the web site [email or web address] and access using universal [place] password for that web site. If you do not have the password, please call the hospital operator.

## 2020-11-09 NOTE — Progress Notes (Addendum)
Physical Therapy Treatment Patient Details Name: William Oneill MRN: [id number] DOB: [birth year] Today's Date: 11/09/2020    History of Present Illness William Oneill is a 75 y.o. 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 PMH:  diabetes, hypertension, colon cancer status post colectomy and chemotherapy, atrial fibrillation    PT Comments    Pt did much better today than yesterday.  Sister (who he lives with) was present throughout the session.  She is aware of his deficits and feels she can care for him at home.   He is min assist without AD during gait, but can be as light as min guard assist with RW.  Sister reports they already have a RW.  Pt would be safe to go home with his sister and I emphasized that she needs to be by his side (his right side, specifically) any time he is up and moving right now.  PT will continue to follow acutely for safe mobility progression.   Follow Up Recommendations  Home health PT;Supervision for mobility/OOB     Equipment Recommendations  None recommended by PT (family already owns a RW)    Recommendations for Other Services       Precautions / Restrictions Precautions Precautions: Fall Precaution Comments: lists to thr R Restrictions Weight Bearing Restrictions: No    Mobility  Bed Mobility    11/09/20 1115  Bed Mobility  Overal bed mobility Needs Assistance  Bed Mobility Supine to Sit  Supine to sit Supervision  General bed mobility comments supervision for safety to get EOB.    Transfers Overall transfer level: Needs assistance Equipment used: Rolling walker (2 wheeled);None;1 person hand held assist Transfers: Sit to/from Stand Sit to Stand: Min assist;Min guard         General transfer comment: Pt was able to stand EOB to pull up his underwear with general staggering posteriorly upon standing (bil legs supported  against bed), however, he did better with RW use to come to standing with min guard assist.  Ambulation/Gait Ambulation/Gait assistance: Min assist;Min guard;+2 safety/equipment Gait Distance (Feet): 250 Feet Assistive device: Rolling walker (2 wheeled) Gait Pattern/deviations: Step-through pattern;Staggering right;Drifts right/left     General Gait Details: Pt needed cues for safe RW use, seems to list/stagger to the right, requiring min guard assist with RW and min assist without RW for gait.  Pt able to self report he likely needs to use the RW for now. Educated his sister who lives with him to guard him on the right side and a bit posterior during supervised gait and that he should not be up walking around without her there.   Stairs             Wheelchair Mobility    Modified Rankin (Stroke Patients Only) Modified Rankin (Stroke Patients Only) Pre-Morbid Rankin Score: No significant disability Modified Rankin: Moderately severe disability     Balance Overall balance assessment: Needs assistance Sitting-balance support: Feet supported;No upper extremity supported Sitting balance-Leahy Scale: Good     Standing balance support: No upper extremity supported Standing balance-Leahy Scale: Poor Standing balance comment: without UE supported pt needed min to min guard assist for posterior LOB and bil lower legs were supported by the bed, likely also for balance. I encouraged him to put his underwear on in sitting for this reason.  Does much better with both hands supported on the RW  Cognition   11/09/20 1115  Cognition  Arousal/Alertness Awake/alert  Behavior During Therapy WFL for tasks assessed/performed  Overall Cognitive Status Impaired/Different from baseline  Area of Impairment Following commands;Safety/judgement;Problem solving;Memory  Memory Decreased short-term memory  Following Commands Follows multi-step commands  inconsistently  Awareness Emergent  Problem Solving Requires verbal cues;Requires tactile cues (requires cueing for path finding activity in the hallway)  General Comments Pt is likely not at his baseline.  Multi step command following impaired as well as higher level problem solving (path finding acticvity in hallway required cues).  He was able to report back that it would be safer at this time for him to use a walker, so he is at least on the surface aware of his deficits.     Exercises      General Comments General comments (skin integrity, edema, etc.): No reports of dizziness, but still has the spontaneous L upbeating rotational nystagmus reguardless of his position.  This could be caused by two different things a peripheral hypofunction that is no longer BPPV related) or central pathology (there is a remote h/o bil cerebellar small strokes on his MRI scan).  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  He had difficulty reading signs in the hallway without moving closer and had difficulty finding things on his left when doing a path finding activity to find his room using context cues.  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      Pertinent Vitals/Pain Pain Assessment: No/denies pain    Home Living Family/patient expects to be discharged to:: Private residence Living Arrangements: Children;Other relatives (sister) Available Help at Discharge: Family;Available 24 hours/day Type of Home: House Home Access: Level entry   Home Layout: One level Home Equipment: Shower seat - built in;Toilet riser;Walker - 2 wheels;Grab bars - tub/shower Additional Comments: has access to [facility] from neighbor    Prior  Function Level of Independence: Independent      Comments: ADLs, limited iADLs (managing medication), driving   PT Goals (current goals can now be found in the care plan section) Acute Rehab PT Goals Patient Stated Goal: to get stronger Progress towards PT goals: Progressing toward goals    Frequency    Min 3X/week      PT Plan Discharge plan needs to be updated    Co-evaluation PT/OT/SLP Co-Evaluation/Treatment: Yes Reason for Co-Treatment: Complexity of the patient's impairments (multi-system involvement) PT goals addressed during session: Mobility/safety with mobility;Balance;Proper use of DME OT goals addressed during session: ADL's and self-care      AM-PAC PT "6 Clicks" Mobility   Outcome Measure  Help needed turning from your back to your side while in a flat bed without using bedrails?: A Little Help needed moving from lying on your back to sitting on the side of a flat bed without using bedrails?: A Little Help needed moving to and from a bed to a chair (including a wheelchair)?: A Little Help needed standing up from a chair using your arms (e.g., wheelchair or bedside chair)?: A Little Help needed to walk in hospital room?: A Little Help needed climbing 3-5 steps with a railing? : A Little 6 Click Score: 18    End of  Session Equipment Utilized During Treatment: Gait belt Activity Tolerance: Patient tolerated treatment well Patient left: in chair;with call bell/phone within reach;with family/visitor present;Other (comment) (working with OT)   PT Visit Diagnosis: Unsteadiness on feet (R26.81);Muscle weakness (generalized) (M62.81);Dizziness and giddiness (R42)     Time: 4270-6237 PT Time Calculation (min) (ACUTE ONLY): 27 min  Charges:  $Gait Training: 8-22 mins                     Verdene Lennert, PT, DPT  Acute Rehabilitation [phone] pager [phone]) [phone] office

## 2020-11-09 NOTE — Progress Notes (Signed)
Patient ID: William Oneill, male   DOB: 1946-04-29, 75 y.o.   MRN: [id number] Patient enrolled for [employer] to ship a 30 day cardiac event monitor to his home. Letter with instructions mailed to patient.

## 2020-11-09 NOTE — TOC Transition Note (Signed)
Transition of Care [facility]) - CM/SW Discharge Note   Patient Details  Name: William Oneill MRN: [id number] Date of Birth: 1946-02-11  [facility] CM/SW Contact:  Pollie Friar, RN Phone Number: 11/09/2020, 12:28 PM   Clinical Narrative:    Recommendations have changed and pt is discharging home with [facility] services. CM met with the patient and his sister. They have all needed DME at home. They have used [facility] in the past and would like to use them again. Amy with [facility] accepted the referral with a [place] for Monday. MD, pt and sister are in agreement that [facility] on Monday is ok.  Sister and pt deny any issues with home medications or transportation. Pt has transport home.    Final next level of care: Home w Home Health Services Barriers to Discharge: No Barriers Identified   Patient Goals and CMS Choice   CMS Medicare.gov Compare Post Acute Care list provided to:: Patient Choice offered to / list presented to : [place]  Discharge Placement                       Discharge Plan and Services                          HH Arranged: PT,OT [facility] Agency: [facility] Date [place]: 11/09/20   Representative spoke with at [place]: Amy  Social Determinants of Health ([place]) Interventions     Readmission Risk Interventions No flowsheet data found.

## 2020-11-09 NOTE — Progress Notes (Signed)
PT and sister were provided discharge instructions and were educated on medication. Pt and sister had not other questions and VSS.

## 2020-11-09 NOTE — Discharge Instructions (Addendum)
Blurred Vision, Adult     Having blurred vision means that you cannot see things clearly. Your vision may seem fuzzy or out of focus. It can involve your vision for objects that are close or far away. It may affect one or both eyes. There are many causes of blurred vision, including cataracts, macular degeneration, eye inflammation (uveitis), and diabetic retinopathy. In many cases, blurred vision has to do with the shape of your eye. An abnormal eye shape means you cannot focus well (refractive error). When this happens, it can cause:  Faraway objects to look blurry (nearsightedness).  Close objects to look blurry (farsightedness).  Blurry vision at any distance (astigmatism). Refractive errors are often corrected with glasses or contacts. Blurred vision can be diagnosed based on your symptoms and a physical exam. Tell your health care provider about any other health problems you have, any recent eye injury, and any prior surgeries. You may need to see a health care provider who specializes in eye problems (ophthalmologist). Your treatment will depend on what is causing your blurred vision. Follow these instructions at home:  Keep all follow-up visits as told by your health care provider. This is important. These include any visits to your eye specialists.  Do not drive or use heavy machinery if your vision is blurry.  Use eye drops only as told by your health care provider.  If you were prescribed glasses or contact lenses, wear the glasses or contacts as told by your health care provider.  Schedule eye exams regularly.  Pay attention to any changes in your symptoms. Contact a health care provider if:  Your symptoms do not improve or they get worse.  You have: ? New symptoms. ? A headache. ? Trouble seeing at night. ? Trouble noticing the difference between colors.  You notice: ? Drooping of your eyelids. ? Drainage coming from your eyes. ? A rash around your eyes. Get  help right away if:  You have: ? Severe eye pain. ? A severe headache. ? A sudden change in vision. ? A sudden loss of vision. ? A vision change after an injury.  You notice flashing lights in your field of vision. Your field of vision is the area that you can see without moving your eyes. Summary  Having blurred vision means that you cannot see things clearly. Your vision may seem fuzzy or out of focus.  There are many causes of blurred vision. In many cases, blurred vision has to do with an abnormal eye shape (refractive error), and it can be corrected with glasses or contact lenses.  Pay attention to any changes in your symptoms. Contact a health care provider if your symptoms do not improve or if you have any new symptoms. This information is not intended to replace advice given to you by your health care provider. Make sure you discuss any questions you have with your health care provider. Document Revised: 03/23/2020 Document Reviewed: 03/23/2020 Elsevier Patient Education  2021 [employer].   Blood Glucose Monitoring, Adult Monitoring your blood sugar (glucose) is an important part of managing your diabetes. Blood glucose monitoring involves checking your blood glucose as often as directed and keeping a log or record of your results over time. Checking your blood glucose regularly and keeping a blood glucose log can:  Help you and your health care provider adjust your diabetes management plan as needed, including your medicines or insulin.  Help you understand how food, exercise, illnesses, and medicines affect your blood  glucose.  Let you know what your blood glucose is at any time. You can quickly find out if you have low blood glucose (hypoglycemia) or high blood glucose (hyperglycemia). Your health care provider will set individualized treatment goals for you. Your goals will be based on your age, other medical conditions you have, and how you respond to diabetes  treatment. Generally, the goal of treatment is to maintain the following blood glucose levels:  Before meals (preprandial): 80-130 mg/dL (4.4-7.2 mmol/L).  After meals (postprandial): below 180 mg/dL (10 mmol/L).  A1C level: less than 7%. Supplies needed:  Blood glucose meter.  Test strips for your meter. Each meter has its own strips. You must use the strips that came with your meter.  A needle to prick your finger (lancet). Do not use a lancet more than one time.  A device that holds the lancet (lancing device).  A journal or log book to write down your results. How to check your blood glucose Checking your blood glucose 1. Wash your hands for at least 20 seconds with soap and water. 2. Prick the side of your finger (not the tip) with the lancet. Do not use the same finger consecutively. 3. Gently rub the finger until a small drop of blood appears. 4. Follow instructions that come with your meter for inserting the test strip, applying blood to the strip, and using your blood glucose meter. 5. Write down your result and any notes in your log.   Using alternative sites Some meters allow you to use areas of your body other than your finger (alternative sites) to test your blood. The most common alternative sites are the forearm, the thigh, and the palm of your hand. Alternative sites may not be as accurate as the fingers because blood flow is slower in those areas. This means that the result you get may be delayed, and it may be different from the result that you would get from your finger. Use the finger only, and do not use alternative sites, if:  You think you have hypoglycemia.  You sometimes do not know that your blood glucose is getting low (hypoglycemia unawareness). General tips and recommendations Blood glucose log  Every time you check your blood glucose, write down your result. Also write down any notes about things that may be affecting your blood glucose, such as your  diet and exercise for the day. This information can help you and your health care provider: ? Look for patterns in your blood glucose over time. ? Adjust your diabetes management plan as needed.  Check if your meter allows you to download your records to a computer or if there is an app for the meter. Most glucose meters store a record of glucose readings in the meter.   If you have type 1 diabetes:  Check your blood glucose 4 or more times a day if you are on intensive insulin therapy with multiple daily injections (MDI) or if you are using an insulin pump. Check your blood glucose: ? Before every meal and snack. ? Before bedtime.  Also check your blood glucose: ? If you have symptoms of hypoglycemia. ? After treating low blood glucose. ? Before doing activities that create a risk for injury, like driving or using machinery. ? Before and after exercise. ? Two hours after a meal. ? Occasionally between 2:00 a.m. and 3:00 a.m., as directed.  You may need to check your blood glucose more often, 6-10 times per day, if: ? You  have diabetes that is not well controlled. ? You are ill. ? You have a history of severe hypoglycemia. ? You have hypoglycemia unawareness. If you have type 2 diabetes:  Check your blood glucose 2 or more times a day if you take insulin or other diabetes medicines.  Check your blood glucose 4 or more times a day if you are on intensive insulin therapy. Occasionally, you may also need to check your glucose between 2:00 a.m. and 3:00 a.m., as directed.  Also check your blood glucose: ? Before and after exercise. ? Before doing activities that create a risk for injury, like driving or using machinery.  You may need to check your blood glucose more often if: ? Your medicine is being adjusted. ? Your diabetes is not well controlled. ? You are ill. General tips  Make sure you always have your supplies with you.  After you use a few boxes of test strips, adjust  (calibrate) your blood glucose meter by following instructions that came with your meter.  If you have questions or need help, all blood glucose meters have a 24-hour hotline phone number available that you can call. Also contact your health care provider with questions or concerns you may have. Where to find more information  The American Diabetes Association: www.diabetes.org  The Association of Diabetes Care & Education Specialists: www.diabeteseducator.org Contact a health care provider if:  Your blood glucose is at or above 240 mg/dL (13.3 mmol/L) for 2 days in a row.  You have been sick or have had a fever for 2 days or longer, and you are not getting better.  You have any of the following problems for more than 6 hours: ? You cannot eat or drink. ? You have nausea or vomiting. ? You have diarrhea. Get help right away if:  Your blood glucose is lower than 54 mg/dL (3 mmol/L).  You become confused, or you have trouble thinking clearly.  You have difficulty breathing.  You have moderate or large ketone levels in your urine. These symptoms may represent a serious problem that is an emergency. Do not wait to see if the symptoms will go away. Get medical help right away. Call your local emergency services (911 in the [place] Do not drive yourself to the hospital. Summary  Monitoring your blood glucose is an important part of managing your diabetes.  Blood glucose monitoring involves checking your blood glucose as often as directed and keeping a log or record of your results over time.  Your health care provider will set individualized treatment goals for you. Your goals will be based on your age, other medical conditions you have, and how you respond to diabetes treatment.  Every time you check your blood glucose, write down your result. Also, write down any notes about things that may be affecting your blood glucose, such as your diet and exercise for the day. This information  is not intended to replace advice given to you by your health care provider. Make sure you discuss any questions you have with your health care provider. Document Revised: 04/18/2020 Document Reviewed: 04/18/2020 Elsevier Patient Education  2021 [employer].   https://www.diabeteseducator.org/docs/default-source/living-with-diabetes/conquering-the-grocery-store-v1.pdf?sfvrsn=4">  Carbohydrate Counting for Diabetes Mellitus, Adult Carbohydrate counting is a method of keeping track of how many carbohydrates you eat. Eating carbohydrates naturally increases the amount of sugar (glucose) in the blood. Counting how many carbohydrates you eat improves your blood glucose control, which helps you manage your diabetes. It is important to know how many  carbohydrates you can safely have in each meal. This is different for every person. A dietitian can help you make a meal plan and calculate how many carbohydrates you should have at each meal and snack. What foods contain carbohydrates? Carbohydrates are found in the following foods:  Grains, such as breads and cereals.  Dried beans and soy products.  Starchy vegetables, such as potatoes, peas, and corn.  Fruit and fruit juices.  Milk and yogurt.  Sweets and snack foods, such as cake, cookies, candy, chips, and soft drinks.   How do I count carbohydrates in foods? There are two ways to count carbohydrates in food. You can read food labels or learn standard serving sizes of foods. You can use either of the methods or a combination of both. Using the Nutrition Facts label The Nutrition Facts list is included on the labels of almost all packaged foods and beverages in the [place] It includes:  The serving size.  Information about nutrients in each serving, including the grams (g) of carbohydrate per serving. To use the Nutrition Facts:  Decide how many servings you will have.  Multiply the number of servings by the number of carbohydrates per  serving.  The resulting number is the total amount of carbohydrates that you will be having. Learning the standard serving sizes of foods When you eat carbohydrate foods that are not packaged or do not include Nutrition Facts on the label, you need to measure the servings in order to count the amount of carbohydrates.  Measure the foods that you will eat with a food scale or measuring cup, if needed.  Decide how many standard-size servings you will eat.  Multiply the number of servings by 15. For foods that contain carbohydrates, one serving equals 15 g of carbohydrates. ? For example, if you eat 2 cups or 10 oz (300 g) of strawberries, you will have eaten 2 servings and 30 g of carbohydrates (2 servings x 15 g = 30 g).  For foods that have more than one food mixed, such as soups and casseroles, you must count the carbohydrates in each food that is included. The following list contains standard serving sizes of common carbohydrate-rich foods. Each of these servings has about 15 g of carbohydrates:  1 slice of bread.  1 six-inch (15 cm) tortilla.  ? cup or 2 oz (53 g) cooked rice or pasta.   cup or 3 oz (85 g) cooked or canned, drained and rinsed beans or lentils.   cup or 3 oz (85 g) starchy vegetable, such as peas, corn, or squash.   cup or 4 oz (120 g) hot cereal.   cup or 3 oz (85 g) boiled or mashed potatoes, or  or 3 oz (85 g) of a large baked potato.   cup or 4 fl oz (118 mL) fruit juice.  1 cup or 8 fl oz (237 mL) milk.  1 small or 4 oz (106 g) apple.   or 2 oz (63 g) of a medium banana.  1 cup or 5 oz (150 g) strawberries.  3 cups or 1 oz (24 g) popped popcorn. What is an example of carbohydrate counting? To calculate the number of carbohydrates in this sample meal, follow the steps shown below. Sample meal  3 oz (85 g) chicken breast.  ? cup or 4 oz (106 g) brown rice.   cup or 3 oz (85 g) corn.  1 cup or 8 fl oz (237 mL) milk.  1 cup  or 5 oz (150  g) strawberries with sugar-free whipped topping. Carbohydrate calculation 1. Identify the foods that contain carbohydrates: ? Rice. ? Corn. ? Milk. ? Strawberries. 2. Calculate how many servings you have of each food: ? 2 servings rice. ? 1 serving corn. ? 1 serving milk. ? 1 serving strawberries. 3. Multiply each number of servings by 15 g: ? 2 servings rice x 15 g = 30 g. ? 1 serving corn x 15 g = 15 g. ? 1 serving milk x 15 g = 15 g. ? 1 serving strawberries x 15 g = 15 g. 4. Add together all of the amounts to find the total grams of carbohydrates eaten: ? 30 g + 15 g + 15 g + 15 g = 75 g of carbohydrates total. What are tips for following this plan? Shopping  Develop a meal plan and then make a shopping list.  Buy fresh and frozen vegetables, fresh and frozen fruit, dairy, eggs, beans, lentils, and whole grains.  Look at food labels. Choose foods that have more fiber and less sugar.  Avoid processed foods and foods with added sugars. Meal planning  Aim to have the same amount of carbohydrates at each meal and for each snack time.  Plan to have regular, balanced meals and snacks. Where to find more information  American Diabetes Association: www.diabetes.org  Centers for Disease Control and Prevention: http://www.wolf.info/ Summary  Carbohydrate counting is a method of keeping track of how many carbohydrates you eat.  Eating carbohydrates naturally increases the amount of sugar (glucose) in the blood.  Counting how many carbohydrates you eat improves your blood glucose control, which helps you manage your diabetes.  A dietitian can help you make a meal plan and calculate how many carbohydrates you should have at each meal and snack. This information is not intended to replace advice given to you by your health care provider. Make sure you discuss any questions you have with your health care provider. Document Revised: 07/21/2019 Document Reviewed: 07/22/2019 Elsevier  Patient Education  2021 [employer].   Benign Positional Vertigo Vertigo is the feeling that you or your surroundings are moving when they are not. Benign positional vertigo is the most common form of vertigo. This is usually a harmless condition (benign). This condition is positional. This means that symptoms are triggered by certain movements and positions. This condition can be dangerous if it occurs while you are doing something that could cause harm to you or others. This includes activities such as driving or operating machinery. What are the causes? The inner ear has fluid-filled canals that help your brain sense movement and balance. When the fluid moves, the brain receives messages about your body's position. With benign positional vertigo, crystals in the inner ear break free and disturb the inner ear area. This causes your brain to receive confusing messages about your body's position. What increases the risk? You are more likely to develop this condition if:  You are a woman.  You are 58 years of age or older.  You have recently had a head injury.  You have an inner ear disease. What are the signs or symptoms? Symptoms of this condition usually happen when you move your head or your eyes in different directions. Symptoms may start suddenly, and usually last for less than a minute. They include:  Loss of balance and falling.  Feeling like you are spinning or moving.  Feeling like your surroundings are spinning or moving.  Nausea and  vomiting.  Blurred vision.  Dizziness.  Involuntary eye movement (nystagmus). Symptoms can be mild and cause only minor problems, or they can be severe and interfere with daily life. Episodes of benign positional vertigo may return (recur) over time. Symptoms may improve over time. How is this diagnosed? This condition may be diagnosed based on:  Your medical history.  Physical exam of the head, neck, and ears.  Positional tests to  check for or stimulate vertigo. You may be asked to turn your head and change positions, such as going from sitting to lying down. A health care provider will watch for symptoms of vertigo. You may be referred to a health care provider who specializes in ear, nose, and throat problems (ENT, or otolaryngologist) or a provider who specializes in disorders of the nervous system (neurologist). How is this treated? This condition may be treated in a session in which your health care provider moves your head in specific positions to help the displaced crystals in your inner ear move. Treatment for this condition may take several sessions. Surgery may be needed in severe cases, but this is rare. In some cases, benign positional vertigo may resolve on its own in 2-4 weeks.   Follow these instructions at home: Safety  Move slowly. Avoid sudden body or head movements or certain positions, as told by your health care provider.  Avoid driving until your health care provider says it is safe for you to do so.  Avoid operating heavy machinery until your health care provider says it is safe for you to do so.  Avoid doing any tasks that would be dangerous to you or others if vertigo occurs.  If you have trouble walking or keeping your balance, try using a cane for stability. If you feel dizzy or unstable, sit down right away.  Return to your normal activities as told by your health care provider. Ask your health care provider what activities are safe for you. General instructions  Take over-the-counter and prescription medicines only as told by your health care provider.  Drink enough fluid to keep your urine pale yellow.  Keep all follow-up visits as told by your health care provider. This is important. Contact a health care provider if:  You have a fever.  Your condition gets worse or you develop new symptoms.  Your family or friends notice any behavioral changes.  You have nausea or vomiting that  gets worse.  You have numbness or a prickling and tingling sensation. Get help right away if you:  Have difficulty speaking or moving.  Are always dizzy.  Faint.  Develop severe headaches.  Have weakness in your legs or arms.  Have changes in your hearing or vision.  Develop a stiff neck.  Develop sensitivity to light. Summary  Vertigo is the feeling that you or your surroundings are moving when they are not. Benign positional vertigo is the most common form of vertigo.  This condition is caused by crystals in the inner ear that become displaced. This causes a disturbance in an area of the inner ear that helps your brain sense movement and balance.  Symptoms include loss of balance and falling, feeling that you or your surroundings are moving, nausea and vomiting, and blurred vision.  This condition can be diagnosed based on symptoms, a physical exam, and positional tests.  Follow safety instructions as told by your health care provider. You will also be told when to contact your health care provider in case of problems. This  information is not intended to replace advice given to you by your health care provider. Make sure you discuss any questions you have with your health care provider. Document Revised: 06/14/2019 Document Reviewed: 12/30/2017 Elsevier Patient Education  [place]. Please get your medications reviewed and adjusted by your Primary MD.  Please request your Primary MD to go over all Hospital Tests and Procedure/Radiological results at the follow up, please get all Hospital records sent to your Prim MD by signing hospital release before you go home.  If you had Pneumonia of Lung problems at the Hospital: Please get a 2 view Chest X ray done in 6-8 weeks after hospital discharge or sooner if instructed by your Primary MD.  If you have Congestive Heart Failure: Please call your Cardiologist or Primary MD anytime you have any of the following symptoms:   1) 3 pound weight gain in 24 hours or 5 pounds in 1 week  2) shortness of breath, with or without a dry hacking cough  3) swelling in the hands, feet or stomach  4) if you have to sleep on extra pillows at night in order to breathe  Follow cardiac low salt diet and 1.5 lit/day fluid restriction.  If you have diabetes Accuchecks 4 times/day, Once in AM empty stomach and then before each meal. Log in all results and show them to your primary doctor at your next visit. If any glucose reading is under 80 or above 300 call your primary MD immediately.  If you have Seizure/Convulsions/Epilepsy: Please do not drive, operate heavy machinery, participate in activities at heights or participate in high speed sports until you have seen by Primary MD or a Neurologist and advised to do so again.  If you had Gastrointestinal Bleeding: Please ask your Primary MD to check a complete blood count within one week of discharge or at your next visit. Your endoscopic/colonoscopic biopsies that are pending at the time of discharge, will also need to followed by your Primary MD.  Get Medicines reviewed and adjusted. Please take all your medications with you for your next visit with your Primary MD  Please request your Primary MD to go over all hospital tests and procedure/radiological results at the follow up, please ask your Primary MD to get all Hospital records sent to his/her office.  If you experience worsening of your admission symptoms, develop shortness of breath, life threatening emergency, suicidal or homicidal thoughts you must seek medical attention immediately by calling 911 or calling your MD immediately  if symptoms less severe.  You must read complete instructions/literature along with all the possible adverse reactions/side effects for all the Medicines you take and that have been prescribed to you. Take any new Medicines after you have completely understood and accpet all the possible adverse  reactions/side effects.   Do not drive or operate heavy machinery when taking Pain medications.   Do not take more than prescribed Pain, Sleep and Anxiety Medications  Special Instructions: If you have smoked or chewed Tobacco  in the last 2 yrs please stop smoking, stop any regular Alcohol  and or any Recreational drug use.  Wear Seat belts while driving.  Please note You were cared for by a hospitalist during your hospital stay. If you have any questions about your discharge medications or the care you received while you were in the hospital after you are discharged, you can call the unit and asked to speak with the hospitalist on call if the hospitalist that took  care of you is not available. Once you are discharged, your primary care physician will handle any further medical issues. Please note that NO REFILLS for any discharge medications will be authorized once you are discharged, as it is imperative that you return to your primary care physician (or establish a relationship with a primary care physician if you do not have one) for your aftercare needs so that they can reassess your need for medications and monitor your lab values.  You can reach the hospitalist office at phone [phone] or fax [phone]   If you do not have a primary care physician, you can call [phone] for a physician referral.

## 2020-11-09 NOTE — Care Management Obs Status (Signed)
[facility] [place] NOTIFICATION   Patient Details  Name: William Oneill MRN: [id number] Date of Birth: 12-05-1945   Medicare Observation Status Notification Given:  Yes    Pollie Friar, RN 11/09/2020, 12:20 PM

## 2020-11-09 NOTE — Evaluation (Signed)
Occupational Therapy Evaluation Patient Details Name: William Oneill MRN: [id number] DOB: 25-Apr-1946 Today's Date: 11/09/2020    History of Present Illness William Oneill is a 75 y.o. 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 PMH:  diabetes, hypertension, colon cancer status post colectomy and chemotherapy, atrial fibrillation   Clinical Impression   PTA patient independent, driving, limited IADLs (sister completes).  Patient admitted for above and limited by problem list below, including impaired balance, decreased activity tolerance, impaired vision, and impaired cognition.  Patient requires min assist to min guard for mobility using RW, up to min assist for ADLs.  He presents with dysconjugate gaze with L eye not scanning during testing, requires cueing to attend to L side to locate/avoid obstacles in hallway/pathfind, but denies visual changes.  He has difficulty following multiple step commands and problem solving.  Discussed safety, fall prevention, ADL compensation, and home safety.  Recommended sister assist with all IADLS at this time (meds and driving), pt and sister agreeable.  Based on performance today, recommend continued OT services while admitted and after dc at [facility] OT level to optmize safety and independence with ADL participation.  Will follow acutely.     Follow Up Recommendations  Home health OT;Supervision/Assistance - 24 hour    Equipment Recommendations  None recommended by OT    Recommendations for Other Services       Precautions / Restrictions Precautions Precautions: Fall Precaution Comments: lists to thr R Restrictions Weight Bearing Restrictions: No      Mobility Bed Mobility Overal bed mobility: Needs Assistance Bed Mobility: Supine to Sit     Supine to sit: Supervision     General bed mobility comments: OOB with PT upon entry     Transfers Overall transfer level: Needs assistance Equipment used: Rolling walker (2 wheeled);None;1 person hand held assist Transfers: Sit to/from Stand Sit to Stand: Min assist;Min guard         General transfer comment: min assist from EOB, improved with RW to min guard    Balance Overall balance assessment: Needs assistance Sitting-balance support: Feet supported;No upper extremity supported Sitting balance-Leahy Scale: Good     Standing balance support: No upper extremity supported;During functional activity Standing balance-Leahy Scale: Poor Standing balance comment: relies on external support, min to min guard with posterior LOB without UE support                           ADL either performed or assessed with clinical judgement   ADL Overall ADL's : Needs assistance/impaired     Grooming: Min guard;Wash/dry hands;Standing       Lower Body Bathing: Minimal assistance;Sit to/from stand   Upper Body Dressing : Supervision/safety;Sitting   Lower Body Dressing: Minimal assistance;Sit to/from stand Lower Body Dressing Details (indicate cue type and reason): figure 4 technique to manage socks, min assist in standing for dynamic balance Toilet Transfer: Minimal assistance;Ambulation;RW Toilet Transfer Details (indicate cue type and reason): simulated         Functional mobility during ADLs: Minimal assistance;+2 for safety/equipment;Rolling walker;Cueing for safety General ADL Comments: patient limited by impaired balance (pull towards R side during mobility), decreased activity tolerance, vision, and cognition     Vision Baseline Vision/History: Wears glasses Wears Glasses: At all times Patient Visual Report: No change from baseline (reports initally had diplopia and blurry vision but back  to baseline) Vision Assessment?: Yes Eye Alignment: Impaired (comment) (dysconjugate gaze (L eye)) Ocular Range of Motion: Restricted on the left Alignment/Gaze  Preference: Within Defined Limits Tracking/Visual Pursuits: Left eye does not track laterally;Left eye does not track medially (Unable to track with L eye) Additional Comments: depth perception appears WFL functionally reaching for items, but unable to discern fields as pt cannot follow mulitple step commnads (turns and looks at number of fingers), Noted difficulty reading/locating room numbers/avoiding obstacles on L side functionally     Perception     Praxis      Pertinent Vitals/Pain Pain Assessment: No/denies pain     Hand Dominance Right   Extremity/Trunk Assessment Upper Extremity Assessment Upper Extremity Assessment: Generalized weakness   Lower Extremity Assessment Lower Extremity Assessment: Defer to PT evaluation   Cervical / Trunk Assessment Cervical / Trunk Assessment: Normal   Communication Communication Communication: No difficulties   Cognition Arousal/Alertness: Awake/alert Behavior During Therapy: WFL for tasks assessed/performed Overall Cognitive Status: Impaired/Different from baseline Area of Impairment: Following commands;Problem solving;Safety/judgement;Awareness;Memory                     Memory: Decreased short-term memory Following Commands: Follows one step commands consistently;Follows one step commands with increased time;Follows multi-step commands inconsistently Safety/Judgement: Decreased awareness of safety;Decreased awareness of deficits Awareness: Emergent Problem Solving: Slow processing;Difficulty sequencing;Requires verbal cues General Comments: decreased awareness to safety for ADLs, improving during session; decreased ability to follow multiple step commands and requires cueing for visual scanning to L side to avoid obstacles   General Comments  sister present and supportive, educated on fall prevention techniques, safety with home mobility (due to visual deficits) and safety with ADLs seated    Exercises     Shoulder  Instructions      Home Living Family/patient expects to be discharged to:: Private residence Living Arrangements: Other relatives Available Help at Discharge: Family;Available 24 hours/day Type of Home: House Home Access: Level entry     Home Layout: One level     Bathroom Shower/Tub: Walk-in shower;Tub/shower unit   [employer]: Standard     Home Equipment: Shower seat - built in;Toilet riser;Walker - 2 wheels;Grab bars - tub/shower   Additional Comments: has access to [facility] from neighbor      Prior Functioning/Environment Level of Independence: Independent        Comments: ADLs, limited iADLs (managing medication), driving        OT Problem List: Decreased activity tolerance;Impaired balance (sitting and/or standing);Impaired vision/perception;Decreased cognition;Decreased safety awareness;Decreased knowledge of use of DME or AE;Decreased knowledge of precautions      OT Treatment/Interventions: Self-care/ADL training;DME and/or AE instruction;Therapeutic activities;Balance training;Patient/family education;Visual/perceptual remediation/compensation;Cognitive remediation/compensation    OT Goals(Current goals can be found in the care plan section) Acute Rehab OT Goals Patient Stated Goal: to get stronger OT Goal Formulation: With patient Time For Goal Achievement: 11/23/20 Potential to Achieve Goals: Good  OT Frequency: Min 2X/week   Barriers to D/C:            Co-evaluation PT/OT/SLP Co-Evaluation/Treatment: Yes Reason for Co-Treatment: Complexity of the patient's impairments (multi-system involvement) PT goals addressed during session: Mobility/safety with mobility;Balance;Proper use of DME OT goals addressed during session: ADL's and self-care      AM-PAC OT "6 Clicks" Daily Activity     Outcome Measure Help from another person eating meals?: A Little Help from another person taking care of personal grooming?: A Little Help from another person  toileting, which includes using toliet,  bedpan, or urinal?: A Little Help from another person bathing (including washing, rinsing, drying)?: A Little Help from another person to put on and taking off regular upper body clothing?: A Little Help from another person to put on and taking off regular lower body clothing?: A Little 6 Click Score: 18   End of Session Equipment Utilized During Treatment: Gait belt;Rolling walker Nurse Communication: Mobility status;Precautions  Activity Tolerance: Patient tolerated treatment well Patient left: in chair;with call bell/phone within reach;with chair alarm set;with family/visitor present;with nursing/sitter in room  OT Visit Diagnosis: Other abnormalities of gait and mobility (R26.89);Muscle weakness (generalized) (M62.81);Other symptoms and signs involving cognitive function;Low vision, both eyes (H54.2)                Time: 3818-2993 OT Time Calculation (min): 27 min Charges:  OT General Charges $OT Visit: 1 Visit OT Evaluation $OT Eval Moderate Complexity: 1 Mod  Jolaine Artist, OT Acute Rehabilitation Services Pager [phone] Office [phone]   William Oneill 11/09/2020, 12:46 PM

## 2020-11-26 ENCOUNTER — Inpatient Hospital Stay: Payer: Medicare Other | Admitting: Family Medicine

## 2020-11-28 ENCOUNTER — Telehealth: Payer: Self-pay | Admitting: Family Medicine

## 2020-11-28 NOTE — Telephone Encounter (Signed)
Forms placed in bin to be filled 

## 2020-11-28 NOTE — Telephone Encounter (Signed)
..  Home Health Certification or Plan of Care Tracking  Is this a Certification or Plan of [place]  Order Number:  [id number] Has charge sheet been attached? yes Where has form been placed:  In Dr. Vonna Kotyk bin up front

## 2020-11-28 NOTE — Telephone Encounter (Signed)
..  Home Health Certification or Plan of Care Tracking  Is this a Certification or Plan of Care? Yes  Flasher:  [facility]  Order Number:  [id number]  Has charge sheet been attached? yes  Where has form been placed:  Dr. Vonna Kotyk bin up front

## 2020-11-29 ENCOUNTER — Ambulatory Visit (INDEPENDENT_AMBULATORY_CARE_PROVIDER_SITE_OTHER): Payer: Medicare Other | Admitting: Family Medicine

## 2020-11-29 ENCOUNTER — Other Ambulatory Visit: Payer: Self-pay

## 2020-11-29 ENCOUNTER — Encounter: Payer: Self-pay | Admitting: Family Medicine

## 2020-11-29 VITALS — BP 118/62 | HR 60 | Temp 98.1°F | Resp 16 | Ht 66.0 in | Wt 171.8 lb

## 2020-11-29 DIAGNOSIS — H532 Diplopia: Secondary | ICD-10-CM | POA: Diagnosis not present

## 2020-11-29 DIAGNOSIS — E119 Type 2 diabetes mellitus without complications: Secondary | ICD-10-CM | POA: Diagnosis not present

## 2020-11-29 DIAGNOSIS — R42 Dizziness and giddiness: Secondary | ICD-10-CM | POA: Diagnosis not present

## 2020-11-29 DIAGNOSIS — R809 Proteinuria, unspecified: Secondary | ICD-10-CM | POA: Diagnosis not present

## 2020-11-29 DIAGNOSIS — H814 Vertigo of central origin: Secondary | ICD-10-CM | POA: Diagnosis not present

## 2020-11-29 DIAGNOSIS — H4902 Third [oculomotor] nerve palsy, left eye: Secondary | ICD-10-CM | POA: Diagnosis not present

## 2020-11-29 DIAGNOSIS — Z794 Long term (current) use of insulin: Secondary | ICD-10-CM

## 2020-11-29 DIAGNOSIS — E1165 Type 2 diabetes mellitus with hyperglycemia: Secondary | ICD-10-CM

## 2020-11-29 DIAGNOSIS — R2681 Unsteadiness on feet: Secondary | ICD-10-CM

## 2020-11-29 DIAGNOSIS — I10 Essential (primary) hypertension: Secondary | ICD-10-CM | POA: Diagnosis not present

## 2020-11-29 DIAGNOSIS — Z7984 Long term (current) use of oral hypoglycemic drugs: Secondary | ICD-10-CM

## 2020-11-29 DIAGNOSIS — M6281 Muscle weakness (generalized): Secondary | ICD-10-CM
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
## 2020-11-29 NOTE — Patient Instructions (Addendum)
I am glad to hear that you are feeling better but if the dizziness or double vision returns I do recommend evaluation with neurology.  I am okay with [place] not starting lisinopril for now based on your blood pressure today, but we need to keep an eye on that and if possible would like to restart lisinopril low-dose in the future to help protect your kidneys with the diabetes and leaking of protein.   Continue to monitor your blood sugars, but if you start getting readings in the 200s I would like you to restart Lantus 5 units/day and let me know if you make that change.  Continue metformin and Januvia same dose for now.  Recheck with me in the next 6 weeks for repeat diabetes testing.    Return to the clinic or go to the nearest emergency room if any of your symptoms worsen or new symptoms occur.

## 2020-11-29 NOTE — Telephone Encounter (Signed)
No action required pt seen today no follow up at this time

## 2020-11-29 NOTE — Telephone Encounter (Signed)
Copied from [place] [id number]. Topic: General - Other >> Nov 29, 2020  2:51 PM William Oneill wrote: Reason for CRM: Patient has returned missed call from the [place] saw no relevant encounters at the time of call  Please contact to further advise when possible

## 2020-11-30 NOTE — Telephone Encounter (Signed)
I have faxed the forms to the # provided on the form and sent the original to scan.

## 2020-12-12 ENCOUNTER — Telehealth: Payer: Self-pay | Admitting: Hematology

## 2020-12-12 NOTE — Telephone Encounter (Signed)
R/s appt per 5/11 sch msg. Pt aware.  

## 2020-12-18 ENCOUNTER — Inpatient Hospital Stay: Payer: Medicare Other

## 2020-12-18 ENCOUNTER — Ambulatory Visit (HOSPITAL_COMMUNITY): Payer: Medicare Other

## 2020-12-20 ENCOUNTER — Inpatient Hospital Stay: Payer: Medicare Other | Admitting: Hematology

## 2020-12-21 ENCOUNTER — Inpatient Hospital Stay: Payer: Medicare Other | Admitting: Hematology

## 2021-01-09 ENCOUNTER — Ambulatory Visit: Payer: Medicare Other | Admitting: Family Medicine

## 2021-01-18 ENCOUNTER — Ambulatory Visit (HOSPITAL_COMMUNITY)
Admission: RE | Admit: 2021-01-18 | Discharge: 2021-01-18 | Disposition: A | Discharge status: Home / Self Care | Payer: Medicare Other | Source: Ambulatory Visit | Attending: Hematology | Admitting: Hematology

## 2021-01-18 ENCOUNTER — Inpatient Hospital Stay: Payer: Medicare Other | Attending: Hematology

## 2021-01-18 ENCOUNTER — Other Ambulatory Visit: Payer: Self-pay

## 2021-01-18 DIAGNOSIS — Z9221 Personal history of antineoplastic chemotherapy: Secondary | ICD-10-CM | POA: Insufficient documentation

## 2021-01-18 DIAGNOSIS — E7800 Pure hypercholesterolemia, unspecified: Secondary | ICD-10-CM | POA: Insufficient documentation

## 2021-01-18 DIAGNOSIS — Z7984 Long term (current) use of oral hypoglycemic drugs: Secondary | ICD-10-CM | POA: Diagnosis not present

## 2021-01-18 DIAGNOSIS — I4891 Unspecified atrial fibrillation: Secondary | ICD-10-CM | POA: Insufficient documentation

## 2021-01-18 DIAGNOSIS — E119 Type 2 diabetes mellitus without complications: Secondary | ICD-10-CM | POA: Diagnosis not present

## 2021-01-18 DIAGNOSIS — Z79899 Other long term (current) drug therapy: Secondary | ICD-10-CM | POA: Insufficient documentation

## 2021-01-18 DIAGNOSIS — G4700 Insomnia, unspecified: Secondary | ICD-10-CM | POA: Diagnosis not present

## 2021-01-18 DIAGNOSIS — I2510 Atherosclerotic heart disease of native coronary artery without angina pectoris: Secondary | ICD-10-CM | POA: Diagnosis not present

## 2021-01-18 DIAGNOSIS — Z85038 Personal history of other malignant neoplasm of large intestine: Secondary | ICD-10-CM | POA: Insufficient documentation

## 2021-01-18 DIAGNOSIS — Z7982 Long term (current) use of aspirin: Secondary | ICD-10-CM | POA: Insufficient documentation

## 2021-01-18 DIAGNOSIS — C180 Malignant neoplasm of cecum: Secondary | ICD-10-CM | POA: Insufficient documentation

## 2021-01-18 LAB — CMP (CANCER CENTER ONLY)
ALT: 20 U/L (ref 0–44)
AST: 18 U/L (ref 15–41)
Albumin: 4 g/dL (ref 3.5–5.0)
Alkaline Phosphatase: 70 U/L (ref 38–126)
Anion gap: 7 (ref 5–15)
BUN: 24 mg/dL — ABNORMAL HIGH (ref 8–23)
CO2: 27 mmol/L (ref 22–32)
Calcium: 9.1 mg/dL (ref 8.9–10.3)
Chloride: 105 mmol/L (ref 98–111)
Creatinine: 0.93 mg/dL (ref 0.61–1.24)
GFR, Estimated: >60 mL/min (ref >60)
Glucose, Bld: 153 mg/dL — ABNORMAL HIGH (ref 70–99)
Potassium: 4.7 mmol/L (ref 3.5–5.1)
Sodium: 139 mmol/L (ref 135–145)
Total Bilirubin: 1.9 mg/dL — ABNORMAL HIGH (ref 0.3–1.2)
Total Protein: 6.8 g/dL (ref 6.5–8.1)

## 2021-01-18 LAB — CBC WITH DIFFERENTIAL (CANCER CENTER ONLY)
Abs Immature Granulocytes: 0.09 10*3/uL — ABNORMAL HIGH (ref 0.00–0.07)
Basophils Absolute: 0.1 10*3/uL (ref 0.0–0.1)
Basophils Relative: 1 %
Eosinophils Absolute: 0.2 10*3/uL (ref 0.0–0.5)
Eosinophils Relative: 2 %
HCT: 42.2 % (ref 39.0–52.0)
Hemoglobin: 14.7 g/dL (ref 13.0–17.0)
Immature Granulocytes: 1 %
Lymphocytes Relative: 17 %
Lymphs Abs: 1.2 10*3/uL (ref 0.7–4.0)
MCH: 31.6 pg (ref 26.0–34.0)
MCHC: 34.8 g/dL (ref 30.0–36.0)
MCV: 90.8 fL (ref 80.0–100.0)
Monocytes Absolute: 0.7 10*3/uL (ref 0.1–1.0)
Monocytes Relative: 9 %
Neutro Abs: 4.9 10*3/uL (ref 1.7–7.7)
Neutrophils Relative %: 70 %
Platelet Count: 234 10*3/uL (ref 150–400)
RBC: 4.65 MIL/uL (ref 4.22–5.81)
RDW: 12.4 % (ref 11.5–15.5)
WBC Count: 7.1 10*3/uL (ref 4.0–10.5)
nRBC: 0 % (ref 0.0–0.2)

## 2021-01-18 MED ORDER — IOHEXOL 9 MG/ML PO SOLN: 500.0000 mL | ORAL | Status: AC

## 2021-01-18 MED ORDER — IOHEXOL 9 MG/ML PO SOLN
ORAL | Status: AC
  Administered 2021-01-18: 1000 mL
  Filled 2021-01-18: qty 1000

## 2021-01-18 MED ORDER — IOHEXOL 300 MG/ML  SOLN
100.0000 mL | Freq: Once | INTRAMUSCULAR | Status: AC | PRN
  Administered 2021-01-18: 100 mL via INTRAVENOUS

## 2021-01-18 MED ORDER — SODIUM CHLORIDE (PF) 0.9 % IJ SOLN
INTRAMUSCULAR | Status: AC
  Filled 2021-01-18: qty 50
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
## 2021-01-22 LAB — CEA (IN HOUSE-CHCC): CEA (CHCC-In House): 1.33 ng/mL (ref 0.00–5.00)

## 2021-01-23 ENCOUNTER — Inpatient Hospital Stay (HOSPITAL_BASED_OUTPATIENT_CLINIC_OR_DEPARTMENT_OTHER): Payer: Medicare Other | Admitting: Hematology

## 2021-01-23 ENCOUNTER — Other Ambulatory Visit: Payer: Self-pay

## 2021-01-23 VITALS — BP 137/60 | HR 61 | Temp 98.0°F | Resp 18 | Ht 66.0 in | Wt 171.5 lb

## 2021-01-23 DIAGNOSIS — C180 Malignant neoplasm of cecum: Secondary | ICD-10-CM

## 2021-01-23 DIAGNOSIS — Z85038 Personal history of other malignant neoplasm of large intestine: Secondary | ICD-10-CM | POA: Diagnosis not present

## 2021-01-30 ENCOUNTER — Ambulatory Visit: Payer: Medicare Other | Admitting: Family Medicine

## 2021-02-12 ENCOUNTER — Other Ambulatory Visit: Payer: Self-pay

## 2021-02-12 ENCOUNTER — Emergency Department
Admission: EM | Admit: 2021-02-12 | Discharge: 2021-02-12 | Disposition: A | Discharge status: Home / Self Care | Payer: Medicare Other | Attending: Student in an Organized Health Care Education/Training Program | Admitting: Student in an Organized Health Care Education/Training Program

## 2021-02-12 ENCOUNTER — Emergency Department: Payer: Medicare Other

## 2021-02-12 DIAGNOSIS — Z7984 Long term (current) use of oral hypoglycemic drugs: Secondary | ICD-10-CM | POA: Insufficient documentation

## 2021-02-12 DIAGNOSIS — R0789 Other chest pain: Secondary | ICD-10-CM | POA: Insufficient documentation

## 2021-02-12 DIAGNOSIS — Z85038 Personal history of other malignant neoplasm of large intestine: Secondary | ICD-10-CM | POA: Diagnosis not present

## 2021-02-12 DIAGNOSIS — K8020 Calculus of gallbladder without cholecystitis without obstruction: Secondary | ICD-10-CM | POA: Diagnosis not present

## 2021-02-12 DIAGNOSIS — Z7982 Long term (current) use of aspirin: Secondary | ICD-10-CM | POA: Insufficient documentation

## 2021-02-12 DIAGNOSIS — R1011 Right upper quadrant pain: Secondary | ICD-10-CM

## 2021-02-12 DIAGNOSIS — E119 Type 2 diabetes mellitus without complications: Secondary | ICD-10-CM | POA: Insufficient documentation

## 2021-02-12 DIAGNOSIS — I10 Essential (primary) hypertension: Secondary | ICD-10-CM | POA: Diagnosis not present

## 2021-02-12 DIAGNOSIS — Z79899 Other long term (current) drug therapy: Secondary | ICD-10-CM | POA: Insufficient documentation

## 2021-02-12 LAB — CBC WITH DIFFERENTIAL/PLATELET
Abs Immature Granulocytes: 0.04 10*3/uL (ref 0.00–0.07)
Basophils Absolute: 0.1 10*3/uL (ref 0.0–0.1)
Basophils Relative: 1 %
Eosinophils Absolute: 0.2 10*3/uL (ref 0.0–0.5)
Eosinophils Relative: 3 %
HCT: 43.2 % (ref 39.0–52.0)
Hemoglobin: 14.9 g/dL (ref 13.0–17.0)
Immature Granulocytes: 1 %
Lymphocytes Relative: 20 %
Lymphs Abs: 1.3 10*3/uL (ref 0.7–4.0)
MCH: 31.6 pg (ref 26.0–34.0)
MCHC: 34.5 g/dL (ref 30.0–36.0)
MCV: 91.7 fL (ref 80.0–100.0)
Monocytes Absolute: 0.6 10*3/uL (ref 0.1–1.0)
Monocytes Relative: 10 %
Neutro Abs: 4.3 10*3/uL (ref 1.7–7.7)
Neutrophils Relative %: 65 %
Platelets: 209 10*3/uL (ref 150–400)
RBC: 4.71 MIL/uL (ref 4.22–5.81)
RDW: 12.3 % (ref 11.5–15.5)
WBC: 6.5 10*3/uL (ref 4.0–10.5)
nRBC: 0 % (ref 0.0–0.2)

## 2021-02-12 LAB — COMPREHENSIVE METABOLIC PANEL
ALT: 19 U/L (ref 0–44)
AST: 20 U/L (ref 15–41)
Albumin: 4.1 g/dL (ref 3.5–5.0)
Alkaline Phosphatase: 62 U/L (ref 38–126)
Anion gap: 4 — ABNORMAL LOW (ref 5–15)
BUN: 20 mg/dL (ref 8–23)
CO2: 25 mmol/L (ref 22–32)
Calcium: 8.6 mg/dL — ABNORMAL LOW (ref 8.9–10.3)
Chloride: 105 mmol/L (ref 98–111)
Creatinine, Ser: 0.83 mg/dL (ref 0.61–1.24)
GFR, Estimated: >60 mL/min (ref >60)
Glucose, Bld: 150 mg/dL — ABNORMAL HIGH (ref 70–99)
Potassium: 3.8 mmol/L (ref 3.5–5.1)
Sodium: 134 mmol/L — ABNORMAL LOW (ref 135–145)
Total Bilirubin: 2.4 mg/dL — ABNORMAL HIGH (ref 0.3–1.2)
Total Protein: 6.8 g/dL (ref 6.5–8.1)

## 2021-02-12 LAB — TROPONIN I (HIGH SENSITIVITY)
Troponin I (High Sensitivity): 6 ng/L (ref <18)
Troponin I (High Sensitivity): 6 ng/L (ref <18)

## 2021-02-12 LAB — LIPASE, BLOOD: Lipase: 26 U/L (ref 11–51)

## 2021-02-12 MED ORDER — NITROGLYCERIN 0.4 MG SL SUBL
0.4000 mg | SUBLINGUAL_TABLET | SUBLINGUAL | Status: DC | PRN
  Administered 2021-02-12 (×3): 0.4 mg via SUBLINGUAL
  Filled 2021-02-12: qty 1

## 2021-02-12 MED ORDER — SODIUM CHLORIDE 0.9 % IV BOLUS
500.0000 mL | Freq: Once | INTRAVENOUS | Status: AC
  Administered 2021-02-12: 500 mL via INTRAVENOUS

## 2021-02-12 NOTE — ED Notes (Signed)
Patient updated on POC, and nitro use. Patient denies needs at this time.

## 2021-02-12 NOTE — ED Notes (Signed)
Patient ambulatory to nurse's station with steady gait. Patient denies chest pain, dizziness, and shortness of breath. Patient blood pressure WNL. Dr. Quentin Cornwall notified.

## 2021-02-12 NOTE — ED Notes (Signed)
Patient is resting comfortably. Warm blanket provided.

## 2021-02-12 NOTE — ED Triage Notes (Signed)
EMS reports picking patient up for complaints of chest pain after a movie. Patient reports generalized chest pain, and reports the pain has improved since arriving to ED. Patient reports no hx of afib, but EMS reports seeing afib en route. Patient was given 81mg  ASA prior to arrival. Patient reports chest pain worsens with palpation.
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
## 2021-02-12 NOTE — ED Notes (Signed)
ED Provider at bedside. 

## 2021-02-18 ENCOUNTER — Telehealth: Payer: Self-pay

## 2021-02-18 NOTE — Telephone Encounter (Signed)
Pt seen in ED needs f/u this visit as well as keep 8/17 visit for regular f/u

## 2021-02-19 NOTE — Telephone Encounter (Signed)
Pt is scheduled for 02/21/21 E[facility]

## 2021-02-21 ENCOUNTER — Other Ambulatory Visit: Payer: Self-pay

## 2021-02-21 ENCOUNTER — Ambulatory Visit (INDEPENDENT_AMBULATORY_CARE_PROVIDER_SITE_OTHER): Payer: Medicare Other | Admitting: Family Medicine

## 2021-02-21 VITALS — BP 128/74 | HR 57 | Temp 98.3°F | Resp 16 | Ht 66.0 in | Wt 167.2 lb

## 2021-02-21 DIAGNOSIS — R0989 Other specified symptoms and signs involving the circulatory and respiratory systems: Secondary | ICD-10-CM | POA: Diagnosis not present

## 2021-02-21 DIAGNOSIS — R918 Other nonspecific abnormal finding of lung field: Secondary | ICD-10-CM | POA: Diagnosis not present

## 2021-02-21 DIAGNOSIS — R1013 Epigastric pain: Secondary | ICD-10-CM | POA: Diagnosis not present

## 2021-02-21 DIAGNOSIS — R079 Chest pain, unspecified: Secondary | ICD-10-CM

## 2021-02-21 DIAGNOSIS — R17 Unspecified jaundice: Secondary | ICD-10-CM | POA: Diagnosis not present

## 2021-02-21 DIAGNOSIS — E871 Hypo-osmolality and hyponatremia: Secondary | ICD-10-CM

## 2021-02-21 LAB — COMPREHENSIVE METABOLIC PANEL
ALT: 16 U/L (ref 0–53)
AST: 16 U/L (ref 0–37)
Albumin: 4.3 g/dL (ref 3.5–5.2)
Alkaline Phosphatase: 69 U/L (ref 39–117)
BUN: 25 mg/dL — ABNORMAL HIGH (ref 6–23)
CO2: 26 mEq/L (ref 19–32)
Calcium: 9.2 mg/dL (ref 8.4–10.5)
Chloride: 102 mEq/L (ref 96–112)
Creatinine, Ser: 0.89 mg/dL (ref 0.40–1.50)
GFR: 84.82 mL/min (ref >60.00)
Glucose, Bld: 142 mg/dL — ABNORMAL HIGH (ref 70–99)
Potassium: 4.3 mEq/L (ref 3.5–5.1)
Sodium: 137 mEq/L (ref 135–145)
Total Bilirubin: 2.2 mg/dL — ABNORMAL HIGH (ref 0.2–1.2)
Total Protein: 6.5 g/dL (ref 6.0–8.3)

## 2021-02-21 NOTE — Patient Instructions (Signed)
Follow-up with general surgeon as recommended by your gastroenterologist.  If any acute worsening or changes of your abdominal pain, be seen right away.  If any return of chest pain be seen right away.  I am glad to hear that is better.  I will check some blood work.  I will refer you to cardiology.  Please have x-ray performed at [place] to the clinic or go to the nearest emergency room if any of your symptoms worsen or new symptoms occur.  Nonspecific Chest Pain, Adult Chest pain can be caused by many different conditions. It can be caused by a condition that is life-threatening and requires treatment right away. It can also be caused by something that is not life-threatening. If you have chest pain, it can be hard to know the difference, so it is important to get helpright away to make sure that you do not have a serious condition. Some life-threatening causes of chest pain include: Heart attack. A tear in the body's main blood vessel (aortic dissection). Inflammation around your heart (pericarditis). A problem in the lungs, such as a blood clot (pulmonary embolism) or a collapsed lung (pneumothorax). Some non life-threatening causes of chest pain include: Heartburn. Anxiety or stress. Damage to the bones, muscles, and cartilage that make up your chest wall. Pneumonia or bronchitis. Shingles infection (varicella-zoster virus). Chest pain can feel like: Pain or discomfort on the surface of your chest or deep in your chest. Crushing, pressure, aching, or squeezing pain. Burning or tingling. Dull or sharp pain that is worse when you move, cough, or take a deep breath. Pain or discomfort that is also felt in your back, neck, jaw, shoulder, or arm, or pain that spreads to any of these areas. Your chest pain may come and go. It may also be constant. Your health care provider will do lab tests and other studies to find the cause of your pain.Treatment will depend on the cause of your  chest pain. Follow these instructions at home: Medicines Take over-the-counter and prescription medicines only as told by your health care provider. If you were prescribed an antibiotic, take it as told by your health care provider. Do not stop taking the antibiotic even if you start to feel better. Lifestyle  Rest as directed by your health care provider. Do not use any products that contain nicotine or tobacco, such as cigarettes and e-cigarettes. If you need help quitting, ask your health care provider. Do not drink alcohol. Make healthy lifestyle choices as recommended. These may include: Getting regular exercise. Ask your health care provider to suggest some activities that are safe for you. Eating a heart-healthy diet. This includes plenty of fresh fruits and vegetables, whole grains, low-fat (lean) protein, and low-fat dairy products. A dietitian can help you find healthy eating options. Maintaining a healthy weight. Managing any other health conditions you have, such as high blood pressure (hypertension) or diabetes. Reducing stress, such as with yoga or relaxation techniques.  General instructions Pay attention to any changes in your symptoms. Tell your health care provider about them or any new symptoms. Avoid any activities that cause chest pain. Keep all follow-up visits as told by your health care provider. This is important. This includes visits for any further testing if your chest pain does not go away. Contact a health care provider if: Your chest pain does not go away. You feel depressed. You have a fever. Get help right away if: Your chest pain gets worse. You have a  cough that gets worse, or you cough up blood. You have severe pain in your abdomen. You faint. You have sudden, unexplained chest discomfort. You have sudden, unexplained discomfort in your arms, back, neck, or jaw. You have shortness of breath at any time. You suddenly start to sweat, or your skin  gets clammy. You feel nausea or you vomit. You suddenly feel lightheaded or dizzy. You have severe weakness, or unexplained weakness or fatigue. Your heart begins to beat quickly, or it feels like it is skipping beats. These symptoms may represent a serious problem that is an emergency. Do not wait to see if the symptoms will go away. Get medical help right away. Call your local emergency services (911 in the [place] Do not drive yourself to the hospital. Summary Chest pain can be caused by a condition that is serious and requires urgent treatment. It may also be caused by something that is not life-threatening. If you have chest pain, it is very important to see your health care provider. Your health care provider may do lab tests and other studies to find the cause of your pain. Follow your health care provider's instructions on taking medicines, making lifestyle changes, and getting emergency treatment if symptoms become worse. Keep all follow-up visits as told by your health care provider. This includes visits for any further testing if your chest pain does not go away. This information is not intended to replace advice given to you by your health care provider. Make sure you discuss any questions you have with your healthcare provider. Document Revised: 01/21/2018 Document Reviewed: 01/21/2018 Elsevier Patient Education  2022 [employer].
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
## 2021-02-22 ENCOUNTER — Ambulatory Visit (INDEPENDENT_AMBULATORY_CARE_PROVIDER_SITE_OTHER)
Admission: RE | Admit: 2021-02-22 | Discharge: 2021-02-22 | Disposition: A | Discharge status: Home / Self Care | Payer: Medicare Other | Source: Ambulatory Visit | Attending: Family Medicine | Admitting: Family Medicine

## 2021-02-22 DIAGNOSIS — R0989 Other specified symptoms and signs involving the circulatory and respiratory systems: Secondary | ICD-10-CM | POA: Diagnosis not present

## 2021-02-22 DIAGNOSIS — R079 Chest pain, unspecified: Secondary | ICD-10-CM | POA: Diagnosis not present

## 2021-02-23 LAB — PRO B NATRIURETIC PEPTIDE: NT-Pro BNP: 59 pg/mL (ref 0–376)

## 2021-03-08 ENCOUNTER — Other Ambulatory Visit: Payer: Self-pay | Admitting: Family Medicine

## 2021-03-08 ENCOUNTER — Telehealth: Payer: Self-pay

## 2021-03-08 DIAGNOSIS — E119 Type 2 diabetes mellitus without complications: Secondary | ICD-10-CM

## 2021-03-08 DIAGNOSIS — Z9189 Other specified personal risk factors, not elsewhere classified: Secondary | ICD-10-CM

## 2021-03-08 NOTE — Progress Notes (Signed)
[facility])                                            [place] Team                                        Statin Quality Measure Assessment    03/08/2021  William Oneill [birth year] [id number]  Per review of chart and payor information, this patient has been flagged for non-adherence to the following CMS Quality Measure:   '[x]'$  Statin Use in Persons with Diabetes  '[x]'$  Statin Use in Persons with Cardiovascular Disease  The 10-year ASCVD risk score [name] DC [name]., et al., 2013) is: 36.8%   Values used to calculate the score:     Age: 75 years     Sex: Male     Is Non-Hispanic African American: No     Diabetic: Yes     Tobacco smoker: No     Systolic Blood Pressure: 0000000 mmHg     Is BP treated: No     HDL Cholesterol: 49 mg/dL     Total Cholesterol: 179 mg/dL LDL 115 mg/dL 11/08/2020  Currently prescribed statin:  '[x]'$  Yes '[]'$  No     Comments: atorvastatin 10 mg daily    - Per Ov note w/ PCP on 10/11/2020, patient should be on atorvastatin and all recent AVS documents indicate this is an active medication.  - Atorvastatin last picked up from [facility] on 03/22/2020. Per pharmacy claims with insurance, patient has not filled medication at any other pharmacy this year.  - Per discussion with patient, initially he stated he thought Dr. Carlota Raspberry stopped atorvastatin a long time ago. However, after explaining to the pt I did not see that atorvastatin was discontinued he stated he was still taking atorvastatin. Denies any missed doses/stockpile, reported that he recently filled atorvastatin, and he uses a pillbox to help remember to take his medication. I am unsure if the patient is intentionally non-compliant since he was adamant he was compliant and genuinely confused that the medication was filled last year.  - I called the pharmacy again, with the patient on the phone, and was able to get atorvastatin refilled.     Please consider ONE of the following recommendations:  - If eligible, CCM referral to help ensure patient is compliant with medications and comprehensive medication review .   Thank you for your time,  Kristeen Miss, [place] Cell: [phone]

## 2021-03-20 ENCOUNTER — Other Ambulatory Visit: Payer: Self-pay

## 2021-03-20 ENCOUNTER — Ambulatory Visit (INDEPENDENT_AMBULATORY_CARE_PROVIDER_SITE_OTHER): Payer: Medicare Other | Admitting: Family Medicine

## 2021-03-20 VITALS — BP 124/66 | HR 61 | Temp 98.3°F | Resp 15 | Ht 66.0 in | Wt 169.6 lb

## 2021-03-20 DIAGNOSIS — E1165 Type 2 diabetes mellitus with hyperglycemia: Secondary | ICD-10-CM | POA: Diagnosis not present

## 2021-03-20 DIAGNOSIS — Z794 Long term (current) use of insulin: Secondary | ICD-10-CM

## 2021-03-20 DIAGNOSIS — R079 Chest pain, unspecified: Secondary | ICD-10-CM | POA: Diagnosis not present

## 2021-03-20 MED ORDER — METFORMIN HCL 1000 MG PO TABS
1000.0000 mg | ORAL_TABLET | Freq: Two times a day (BID) | ORAL | 1 refills | Status: DC
Start: 2021-03-20 — End: 2021-06-20

## 2021-03-20 MED ORDER — ATORVASTATIN CALCIUM 10 MG PO TABS: 10.0000 mg | ORAL_TABLET | Freq: Every day | ORAL | 1 refills | Status: DC

## 2021-03-20 MED ORDER — SITAGLIPTIN PHOSPHATE 100 MG PO TABS: 100.0000 mg | ORAL_TABLET | Freq: Every day | ORAL | 1 refills | Status: DC
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
## 2021-03-20 NOTE — Patient Instructions (Signed)
Keep follow up specialists. Call Dr. Katy Fitch for appointment.  No med changes for now. Thanks for coming in today.

## 2021-03-21 LAB — HEMOGLOBIN A1C: Hgb A1c MFr Bld: 6.7 % — ABNORMAL HIGH (ref 4.6–6.5)

## 2021-03-29 ENCOUNTER — Other Ambulatory Visit: Payer: Self-pay | Admitting: Surgery

## 2021-03-29 DIAGNOSIS — S32020A Wedge compression fracture of second lumbar vertebra, initial encounter for closed fracture: Secondary | ICD-10-CM | POA: Insufficient documentation

## 2021-04-04 LAB — HM DIABETES EYE EXAM

## 2021-04-10 NOTE — Progress Notes (Signed)
Monitor was never applied. Order will be cancelled

## 2021-04-14 ENCOUNTER — Telehealth: Payer: Self-pay

## 2021-04-14 NOTE — Telephone Encounter (Signed)
Left message on voicemail req call back to schedule appt with health advisor I am available Fri 9/16 2-5, 9/17 9-4, 9/18 10-3, 9/23 2-5

## 2021-04-23 NOTE — Pre-Procedure Instructions (Signed)
Surgical Instructions    Your procedure is scheduled on Thursday, September 29th, 2022.  Report to [facility] Main Entrance "A" at 05:30 A.M., then check in with the Admitting office.  Call this number if you have problems the morning of surgery:  [phone]   If you have any questions prior to your surgery date call [phone]: Open Monday-Friday 8am-4pm    Remember:  Do not eat after midnight the night before your surgery  You may drink clear liquids until 05:30 A.M. the morning of your surgery.   Clear liquids allowed are: Water, Non-Citrus Juices (without pulp), Carbonated Beverages, Clear Tea, Black Coffee ONLY (NO MILK, CREAM OR POWDERED CREAMER of any kind), and Gatorade    Take these medicines the morning of surgery with A SIP OF WATER: atorvastatin (LIPITOR) MYRBETRIQ tamsulosin (FLOMAX)    As of today, STOP taking any Aspirin (unless otherwise instructed by your surgeon) Aleve, Naproxen, Ibuprofen, Motrin, Advil, Goody's, BC's, all herbal medications, fish oil, and all vitamins.   WHAT DO I DO ABOUT MY DIABETES MEDICATION?   Do not take metFORMIN (GLUCOPHAGE) or sitaGLIPtin (JANUVIA) the morning of surgery   HOW TO MANAGE YOUR DIABETES BEFORE AND AFTER SURGERY  Why is it important to control my blood sugar before and after surgery? Improving blood sugar levels before and after surgery helps healing and can limit problems. A way of improving blood sugar control is eating a healthy diet by:  Eating less sugar and carbohydrates  Increasing activity/exercise  Talking with your doctor about reaching your blood sugar goals High blood sugars (greater than 180 mg/dL) can raise your risk of infections and slow your recovery, so you will need to focus on controlling your diabetes during the weeks before surgery. Make sure that the doctor who takes care of your diabetes knows about your planned surgery including the date and location.  How do I manage my blood sugar  before surgery? Check your blood sugar at least 4 times a day, starting 2 days before surgery, to make sure that the level is not too high or low.  Check your blood sugar the morning of your surgery when you wake up and every 2 hours until you get to the Short Stay unit.  If your blood sugar is less than 70 mg/dL, you will need to treat for low blood sugar: Do not take insulin. Treat a low blood sugar (less than 70 mg/dL) with  cup of clear juice (cranberry or apple), 4 glucose tablets, OR glucose gel. Recheck blood sugar in 15 minutes after treatment (to make sure it is greater than 70 mg/dL). If your blood sugar is not greater than 70 mg/dL on recheck, call [phone] for further instructions. Report your blood sugar to the short stay nurse when you get to Short Stay.  If you are admitted to the hospital after surgery: Your blood sugar will be checked by the staff and you will probably be given insulin after surgery (instead of oral diabetes medicines) to make sure you have good blood sugar levels. The goal for blood sugar control after surgery is 80-180 mg/dL.   Do not wear jewelry Do not wear lotions, powders, colognes, or deodorant. Men may shave face and neck. Do not bring valuables to the hospital.             [facility] is not responsible for any belongings or valuables.  Do NOT Smoke (Tobacco/Vaping)  24 hours prior to your procedure If you use a CPAP  at night, you may bring your mask for your overnight stay.   Contacts, glasses, dentures or bridgework may not be worn into surgery, please bring cases for these belongings   For patients admitted to the hospital, discharge time will be determined by your treatment team.   Patients discharged the day of surgery will not be allowed to drive home, and someone needs to stay with them for 24 hours.  NO VISITORS WILL BE ALLOWED IN PRE-OP WHERE PATIENTS GET READY FOR SURGERY.  ONLY 1 SUPPORT PERSON MAY BE PRESENT WHILE YOU ARE IN  SURGERY.  IF YOU ARE TO BE ADMITTED, ONCE YOU ARE IN YOUR ROOM YOU WILL BE ALLOWED TWO (2) VISITORS.  Minor children may have two parents present. Special consideration for safety and communication needs will be reviewed on a case by case basis.  Special instructions:    Oral Hygiene is also important to reduce your risk of infection.  Remember - BRUSH YOUR TEETH THE MORNING OF SURGERY WITH YOUR REGULAR TOOTHPASTE   [place]- Preparing For Surgery  Before surgery, you can play an important role. Because skin is not sterile, your skin needs to be as free of germs as possible. You can reduce the number of germs on your skin by washing with CHG (chlorahexidine gluconate) Soap before surgery.  CHG is an antiseptic cleaner which kills germs and bonds with the skin to continue killing germs even after washing.     Please do not use if you have an allergy to CHG or antibacterial soaps. If your skin becomes reddened/irritated stop using the CHG.  Do not shave (including legs and underarms) for at least 48 hours prior to first CHG shower. It is OK to shave your face.  Please follow these instructions carefully.     Shower the NIGHT BEFORE SURGERY and the MORNING OF SURGERY with CHG Soap.   If you chose to wash your hair, wash your hair first as usual with your normal shampoo. After you shampoo, rinse your hair and body thoroughly to remove the shampoo.  Then [employer] and genitals (private parts) with your normal soap and rinse thoroughly to remove soap.  After that Use CHG Soap as you would any other liquid soap. You can apply CHG directly to the skin and wash gently with a scrungie or a clean washcloth.   Apply the CHG Soap to your body ONLY FROM THE NECK DOWN.  Do not use on open wounds or open sores. Avoid contact with your eyes, ears, mouth and genitals (private parts). Wash Face and genitals (private parts)  with your normal soap.   Wash thoroughly, paying special attention to the area where  your surgery will be performed.  Thoroughly rinse your body with warm water from the neck down.  DO NOT shower/wash with your normal soap after using and rinsing off the CHG Soap.  Pat yourself dry with a CLEAN TOWEL.  Wear CLEAN PAJAMAS to bed the night before surgery  Place CLEAN SHEETS on your bed the night before your surgery  DO NOT SLEEP WITH PETS.   Day of Surgery:  Take a shower with CHG soap. Wear Clean/Comfortable clothing the morning of surgery Do not apply any deodorants/lotions.   Remember to brush your teeth WITH YOUR REGULAR TOOTHPASTE.   Please read over the following fact sheets that you were given.

## 2021-04-24 ENCOUNTER — Encounter (HOSPITAL_COMMUNITY): Payer: Self-pay

## 2021-04-24 ENCOUNTER — Other Ambulatory Visit: Payer: Self-pay

## 2021-04-24 ENCOUNTER — Encounter: Payer: Self-pay | Admitting: Family Medicine

## 2021-04-24 ENCOUNTER — Telehealth (INDEPENDENT_AMBULATORY_CARE_PROVIDER_SITE_OTHER): Payer: Medicare Other | Admitting: Family Medicine

## 2021-04-24 ENCOUNTER — Encounter (HOSPITAL_COMMUNITY)
Admission: RE | Admit: 2021-04-24 | Discharge: 2021-04-24 | Disposition: A | Discharge status: Home / Self Care | Payer: Medicare Other | Source: Ambulatory Visit | Attending: Surgery | Admitting: Surgery

## 2021-04-24 DIAGNOSIS — K8020 Calculus of gallbladder without cholecystitis without obstruction: Secondary | ICD-10-CM | POA: Diagnosis not present

## 2021-04-24 DIAGNOSIS — Z7982 Long term (current) use of aspirin: Secondary | ICD-10-CM | POA: Insufficient documentation

## 2021-04-24 DIAGNOSIS — R5383 Other fatigue: Secondary | ICD-10-CM | POA: Diagnosis not present

## 2021-04-24 DIAGNOSIS — Z7984 Long term (current) use of oral hypoglycemic drugs: Secondary | ICD-10-CM | POA: Insufficient documentation

## 2021-04-24 DIAGNOSIS — Z01812 Encounter for preprocedural laboratory examination: Secondary | ICD-10-CM | POA: Diagnosis not present

## 2021-04-24 DIAGNOSIS — E119 Type 2 diabetes mellitus without complications: Secondary | ICD-10-CM | POA: Insufficient documentation

## 2021-04-24 DIAGNOSIS — D649 Anemia, unspecified: Secondary | ICD-10-CM | POA: Insufficient documentation

## 2021-04-24 DIAGNOSIS — J029 Acute pharyngitis, unspecified: Secondary | ICD-10-CM | POA: Diagnosis not present

## 2021-04-24 DIAGNOSIS — E7800 Pure hypercholesterolemia, unspecified: Secondary | ICD-10-CM | POA: Diagnosis not present

## 2021-04-24 DIAGNOSIS — R0981 Nasal congestion: Secondary | ICD-10-CM | POA: Diagnosis not present

## 2021-04-24 DIAGNOSIS — Z79899 Other long term (current) drug therapy: Secondary | ICD-10-CM | POA: Diagnosis not present

## 2021-04-24 HISTORY — DX: Cardiac arrhythmia, unspecified: I49.9

## 2021-04-24 LAB — BASIC METABOLIC PANEL
Anion gap: 8 (ref 5–15)
BUN: 16 mg/dL (ref 8–23)
CO2: 24 mmol/L (ref 22–32)
Calcium: 8.7 mg/dL — ABNORMAL LOW (ref 8.9–10.3)
Chloride: 105 mmol/L (ref 98–111)
Creatinine, Ser: 0.95 mg/dL (ref 0.61–1.24)
GFR, Estimated: >60 mL/min (ref >60)
Glucose, Bld: 239 mg/dL — ABNORMAL HIGH (ref 70–99)
Potassium: 4 mmol/L (ref 3.5–5.1)
Sodium: 137 mmol/L (ref 135–145)

## 2021-04-24 LAB — CBC
HCT: 42.2 % (ref 39.0–52.0)
Hemoglobin: 14.6 g/dL (ref 13.0–17.0)
MCH: 32.2 pg (ref 26.0–34.0)
MCHC: 34.6 g/dL (ref 30.0–36.0)
MCV: 93 fL (ref 80.0–100.0)
Platelets: 214 10*3/uL (ref 150–400)
RBC: 4.54 MIL/uL (ref 4.22–5.81)
RDW: 12.2 % (ref 11.5–15.5)
WBC: 5.6 10*3/uL (ref 4.0–10.5)
nRBC: 0 % (ref 0.0–0.2)

## 2021-04-24 LAB — GLUCOSE, CAPILLARY: Glucose-Capillary: 250 mg/dL — ABNORMAL HIGH (ref 70–99)
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
## 2021-04-24 NOTE — Progress Notes (Signed)
IFX:[id number] William Raspberry, MD Cardiologist: [place]  EKG: 4/7/11f2 CXR: 02/22/21 ECHO: denies Stress Test: denies Cardiac Cath: denies  Fasting Blood Sugar- 130-250 Checks Blood Sugar__1-2_ times a day  OSA/CPAP: No  ASA: Patient will call office today for instructions Blood Thinner: No  Covid test not needed  Anesthesia Review: Yes, history afib.  Patient has appointment tomorrow 9/22 with cardiology for first time.  Patient denies shortness of breath, fever, cough, and chest pain at PAT appointment.  Patient verbalized understanding of instructions provided today at the PAT appointment.  Patient asked to review instructions at home and day of surgery.

## 2021-04-25 ENCOUNTER — Ambulatory Visit: Payer: Medicare Other | Admitting: Internal Medicine

## 2021-04-25 ENCOUNTER — Encounter (HOSPITAL_COMMUNITY): Payer: Self-pay | Admitting: Vascular Surgery

## 2021-04-25 ENCOUNTER — Telehealth: Payer: Self-pay

## 2021-04-25 NOTE — Telephone Encounter (Signed)
Noted. Thanks.

## 2021-04-25 NOTE — Progress Notes (Addendum)
Anesthesia Chart Review:  Case: [id number] Date/Time: 05/02/21 0715   Procedure: LAPAROSCOPIC CHOLECYSTECTOMY   Anesthesia type: General   Pre-op diagnosis: SYMPTOMATIC GALLSTONES   Location: [place] OR ROOM 09 / [place] [facility]   Surgeons: Coralie Keens, MD       DISCUSSION: Patient is a 75 year old male scheduled for the above procedure.   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  Admission 11/07/20-11/09/20 for vertigo/near syncope with transient diplopia. MRI brain negative for acute process. 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 Neurology recommended vestibular rehab for BPPV and 30 day event monitor for 3rd nerve palsy, although thought related to DM and not likely aneurysm by CTA.   He had ED visit 02/12/21 for midsternal chest pain radiating to both shoulder and associated with nausea. No exertional effect on symptoms. No pleuritic chest pain. Chest pain not responsive to Nitro, but resolved in ED. Troponin negative x2. Elevated bilirubin 2.4 (up from 1.9; normal AST/ALT), so RUQ [place] ordered which showed cholelithiasis, 8 mm common bile duct but likely normal for age. CXR did show vascular congestion concerning for CHF. He was felt stable for discharge home with out-patient follow-up PCP. He had PCP follow-up with Dr. Carlota Raspberry on 02/21/21. No further chest pain. Occasional upper abdominal pain, worse with spicy food. Had seen Dr. Benson Norway 02/13/21 and referred to general surgery as symptoms  thought due to gallbladder. Given pulmonary vascular congestion on CXR and chest/epigastric pain, Dr. Carlota Raspberry did refer to cardiology. 30 day event monitor never done. BNP was normal at 59 on 02/21/21. He had pending general surgery evaluation at that time.   Patient was scheduled for his cardiology evaluation on 04/25/21 with Cherlynn Kaiser, MD, but was rescheduled to 06/24/21. This may have been due to patient having URI symptoms with reported negative home COVID-19 test. No further chest pain at PCP follow-up on 03/20/21. He had video visit with Dr. Carlota Raspberry on 04/24/21 for 3 day history of sore though, congestion without fever and negative home COVID-19 test. He notes patient scheduled for cholecystectomy 05/02/21. Discussed with anesthesiologist Renold Don, MD. Will reach out to Dr. Carlota Raspberry for input on timing of cardiology evaluation. Staff message sent. Will leave chart for follow-up.   VS: BP 123/70   Pulse 73   Temp 36.8 C   Resp 19   Ht _0  (1.676 m)   Wt 77.1 kg   SpO2 97%   BMI 27.44 kg/m    PROVIDERS: [name], MD is PCP  [name], MD is HEM-ONC Carol Ada, MD is GI Cherlynn Kaiser, MD is cardiologist. New patient evaluation scheduled for 06/24/21.    LABS: Labs reviewed: Acceptable for surgery. A1c 6.7% 03/20/21.  (all labs ordered are listed, but only abnormal results are displayed)  Labs Reviewed  GLUCOSE, CAPILLARY - Abnormal; Notable for the following components:      Result Value   Glucose-Capillary 250 (*)    All other components within normal limits  BASIC METABOLIC PANEL - Abnormal; Notable for the following  components:   Glucose, Bld 239 (*)    Calcium 8.7 (*)    All other components within normal limits  CBC     IMAGES: CXR 02/22/21: FINDINGS: Lungs clear. Heart size normal. Aortic atherosclerosis. No pneumothorax or pleural fluid. No acute or focal bony abnormality IMPRESSION: No acute disease. Aortic Atherosclerosis  (ICD10-I70.0).  [place] Abd RUQ 02/12/21: IMPRESSION: Cholelithiasis without complicating factors.   1V PCXR 02/12/21: FINDINGS: Cardiac shadow is mildly enlarged but stable. Aortic calcifications are noted. Generalized vascular congestion is noted centrally with mild interstitial edema. No focal infiltrate or effusion is noted. No acute bony abnormality is seen. IMPRESSION: Changes consistent with CHF   CT Chest/abd/pelvis 01/18/21: IMPRESSION: No findings to suggest recurrent or metastatic disease within the chest, abdomen or pelvis.     EKG: EKG 02/12/21: Result did not cross over into CHL but per ED provider Merlyn Lot, MD note, "My review and personal interpretation at Time: 16:18   Indication: chest pain  Rate: 60  Rhythm: sinus with occasional pac Axis: normal Other: nonspecific st abn, no stemi"  EKG 11/07/20: Sinus rhythm Borderline prolonged PR interval (PR 216 ms} Abnormal R-wave progression, early transition Minimal ST depression, diffuse leads Confirmed by Davonna Belling [phone]) on 11/07/2020 10:19:27 PM   CV: Nuclear stress test 10/26/04  IMPRESSION:    1. No stress induced ischemia.  2. Ejection fraction 60 percent.  3. Fixed thinning of the anterior wall.    Past Medical History:  Diagnosis Date   A-fib [facility])    "years ago, for a short period, never came back"   Anemia    Cancer ([place])    Cancer ([place])    colon cancer   Colon cancer ([place])    Diabetes mellitus without complication ([place])    type 2   Diabetes mellitus without complication ([place])    Dysrhythmia    High cholesterol    History of kidney stones    passed   Insomnia    Pneumonia    x1    Past Surgical History:  Procedure Laterality Date   COLON SURGERY     laparoscopic ascending colectomy  Dr. Barry Dienes 02-02-18   COLONOSCOPY     LAPAROSCOPIC PARTIAL COLECTOMY N/A 02/02/2018   Procedure: LAPAROSCOPIC ASCENDING COLECTOMY;  Surgeon: Stark Klein, MD;  Location: [facility];  Service: General;   Laterality: N/A;   PORT-A-CATH REMOVAL Left 02/23/2020   Procedure: REMOVAL PORT-A-CATH;  Surgeon: Stark Klein, MD;  Location: [place];  Service: General;  Laterality: Left;   PORTACATH PLACEMENT Left 03/03/2018   Procedure: INSERTION PORT-A-CATH;  Surgeon: Stark Klein, MD;  Location: [place];  Service: General;  Laterality: Left;   TONSILLECTOMY     as a child    MEDICATIONS:  aspirin EC 81 MG EC tablet   atorvastatin (LIPITOR) 10 MG tablet   blood glucose meter kit and supplies   Lancets (ACCU-CHEK SOFT TOUCH) lancets   metFORMIN (GLUCOPHAGE) 1000 MG tablet   Multiple Vitamin (MULTIVITAMIN WITH MINERALS) TABS tablet   MYRBETRIQ 25 MG TB24 tablet   ONETOUCH ULTRA test strip   sitaGLIPtin (JANUVIA) 100 MG tablet   tamsulosin (FLOMAX) 0.4 MG CAPS capsule   traZODone (DESYREL) 100 MG tablet   No current facility-administered medications for this encounter.    Myra Gianotti, PA-C Surgical Short Stay/Anesthesiology [facility] Phone [phone] [facility] Phone [phone] 04/25/2021 4:57 PM

## 2021-04-25 NOTE — Telephone Encounter (Signed)
Patient called in wanting Dr. Carlota Raspberry to know that his at home COVID test was negative.

## 2021-04-28 NOTE — Progress Notes (Deleted)
Cardiology Office Note:   Date:  04/28/2021  NAME:  William Oneill    MRN: [id number] DOB:  [birth year]   PCP:  [name], MD  Cardiologist:  None  Electrophysiologist:  None   Referring MD: [name], MD   No chief complaint on file. ***  History of Present Illness:   William Oneill is a 75 y.o. male with a hx of DM, HLD who is being seen today for the evaluation of preoperative assessment at the request of [name], MD. Seen in ER for atypical CP 02/2021. Found to have gallstones which explain symptoms. Planned to have surgery on 05/02/2021. Admitted 11/2020 for dizziness attributed to vertigo.   Problem List DM -A1c 6.7 HLD -T chol 179, HDL 49, LDL 115, TG 77 Cecal CA s/p colectomy Paroxysmal Afib -remote episode, no recurrence, no AC  Past Medical History: Past Medical History:  Diagnosis Date   A-fib ([place])    "years ago, for a short period, never came back"   Anemia    Cancer ([place])    Cancer ([place])    colon cancer   Colon cancer ([place])    Diabetes mellitus without complication ([place])    type 2   Diabetes mellitus without complication ([place])    Dysrhythmia    High cholesterol    History of kidney stones    passed   Insomnia    Pneumonia    x1    Past Surgical History: Past Surgical History:  Procedure Laterality Date   COLON SURGERY     laparoscopic ascending colectomy  Dr. Barry Dienes 02-02-18   COLONOSCOPY     LAPAROSCOPIC PARTIAL COLECTOMY N/A 02/02/2018   Procedure: LAPAROSCOPIC ASCENDING COLECTOMY;  Surgeon: Stark Klein, MD;  Location[facility];  Service: General;  Laterality: N/A;   PORT-A-CATH REMOVAL Left 02/23/2020   Procedure: REMOVAL PORT-A-CATH;  Surgeon: Stark Klein, MD;  Location: [place];  Service: General;  Laterality: Left;   PORTACATH PLACEMENT Left 03/03/2018   Procedure: INSERTION PORT-A-CATH;  Surgeon: Stark Klein, MD;  Location: [place];  Service: General;  Laterality: Left;   TONSILLECTOMY     as  a child    Current Medications: No outpatient medications have been marked as taking for the 04/29/21 encounter (Appointment) with Geralynn Rile, MD.     Allergies:    Patient has no known allergies.   Social History: Social History   Socioeconomic History   Marital status: Single    Spouse name: Not on file   Number of children: Not on file   Years of education: Not on file   Highest education level: Not on file  Occupational History   Not on file  Tobacco Use   Smoking status: Never   Smokeless tobacco: Never  Vaping Use   Vaping Use: Never used  Substance and Sexual Activity   Alcohol use: Not Currently   Drug use: Never   Sexual activity: Yes  Other Topics Concern   Not on file  Social History Narrative   ** Merged History Encounter **       Social Determinants of Health   Financial Resource Strain: Not on file  Food Insecurity: Not on file  Transportation Needs: Not on file  Physical Activity: Not on file  Stress: Not on file  Social Connections: Not on file     Family History: The patient's ***family history includes CAD in his brother, father, and sister; Diabetes in his sister; Heart  attack in his father; Heart disease in his father and sister; Stroke in his mother.  ROS:   All other ROS reviewed and negative. Pertinent positives noted in the HPI.     EKGs/Labs/Other Studies Reviewed:   The following studies were personally reviewed by me today:  EKG:  EKG is *** ordered today.  The ekg ordered today demonstrates ***, and was personally reviewed by me.   Recent Labs: 02/21/2021: ALT 16; NT-Pro BNP 59 04/24/2021: BUN 16; Creatinine, Ser 0.95; Hemoglobin 14.6; Platelets 214; Potassium 4.0; Sodium 137   Recent Lipid Panel    Component Value Date/Time   CHOL 179 11/08/2020 0331   CHOL 172 10/11/2020 1646   TRIG 77 11/08/2020 0331   HDL 49 11/08/2020 0331   HDL 53 10/11/2020 1646   CHOLHDL 3.7 11/08/2020 0331   VLDL 15 11/08/2020 0331    LDLCALC 115 (H) 11/08/2020 0331   LDLCALC 100 (H) 10/11/2020 1646    Physical Exam:   VS:  There were no vitals taken for this visit.   Wt Readings from Last 3 Encounters:  04/24/21 170 lb (77.1 kg)  03/20/21 169 lb 9.6 oz (76.9 kg)  02/21/21 167 lb 3.2 oz (75.8 kg)    General: Well nourished, well developed, in no acute distress Head: Atraumatic, normal size  Eyes: PEERLA, EOMI  Neck: Supple, no JVD Endocrine: No thryomegaly Cardiac: Normal S1, S2; RRR; no murmurs, rubs, or gallops Lungs: Clear to auscultation bilaterally, no wheezing, rhonchi or rales  Abd: Soft, nontender, no hepatomegaly  Ext: No edema, pulses 2+ Musculoskeletal: No deformities, BUE and BLE strength normal and equal Skin: Warm and dry, no rashes   Neuro: Alert and oriented to person, place, time, and situation, CNII-XII grossly intact, no focal deficits  Psych: Normal mood and affect   ASSESSMENT:   William Oneill is a 75 y.o. male who presents for the following: No diagnosis found.  PLAN:   There are no diagnoses linked to this encounter.  {Are you ordering a CV Procedure (e.g. stress test, cath, DCCV, TEE, etc)?   Press F2        :[id number]}  Disposition: No follow-ups on file.  Medication Adjustments/Labs and Tests Ordered: Current medicines are reviewed at length with the patient today.  Concerns regarding medicines are outlined above.  No orders of the defined types were placed in this encounter.  No orders of the defined types were placed in this encounter.   There are no Patient Instructions on file for this visit.   Time Spent with Patient: I have spent a total of *** minutes with patient reviewing hospital notes, telemetry, EKGs, labs and examining the patient as well as establishing an assessment and plan that was discussed with the patient.  > 50% of time was spent in direct patient care.  Signed, Addison Naegeli. Audie Box, MD, [place]  [address] [place], [place] [address] [phone]  04/28/2021 9:52 AM

## 2021-04-29 ENCOUNTER — Ambulatory Visit: Payer: Medicare Other | Admitting: Cardiovascular Disease

## 2021-04-29 DIAGNOSIS — E782 Mixed hyperlipidemia: Secondary | ICD-10-CM

## 2021-04-29 DIAGNOSIS — Z01810 Encounter for preprocedural cardiovascular examination: Secondary | ICD-10-CM

## 2021-05-02 ENCOUNTER — Encounter (HOSPITAL_COMMUNITY): Admission: RE | Payer: Self-pay | Source: Home / Self Care

## 2021-05-02 ENCOUNTER — Ambulatory Visit (HOSPITAL_COMMUNITY): Admission: RE | Admit: 2021-05-02 | Payer: Medicare Other | Source: Home / Self Care | Admitting: Surgery

## 2021-05-02 SURGERY — LAPAROSCOPIC CHOLECYSTECTOMY
Anesthesia: General

## 2021-05-07 ENCOUNTER — Ambulatory Visit (INDEPENDENT_AMBULATORY_CARE_PROVIDER_SITE_OTHER): Payer: Medicare Other | Admitting: Cardiovascular Disease

## 2021-05-07 ENCOUNTER — Other Ambulatory Visit: Payer: Self-pay

## 2021-05-07 ENCOUNTER — Encounter: Payer: Self-pay | Admitting: Cardiovascular Disease

## 2021-05-07 DIAGNOSIS — Z01818 Encounter for other preprocedural examination: Secondary | ICD-10-CM | POA: Insufficient documentation

## 2021-05-07 DIAGNOSIS — E782 Mixed hyperlipidemia: Secondary | ICD-10-CM

## 2021-05-07 DIAGNOSIS — I10 Essential (primary) hypertension: Secondary | ICD-10-CM | POA: Diagnosis not present

## 2021-05-07 DIAGNOSIS — Z01810 Encounter for preprocedural cardiovascular examination: Secondary | ICD-10-CM | POA: Diagnosis not present

## 2021-05-07 DIAGNOSIS — E785 Hyperlipidemia, unspecified: Secondary | ICD-10-CM | POA: Insufficient documentation

## 2021-05-07 NOTE — Progress Notes (Signed)
05/07/2021 William Oneill   [birth year]  [id number]  Primary Physician William Agreste, MD Primary Cardiologist: William Harp MD William Oneill, [place], [place]  HPI:  William Oneill is a 75 y.o. 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  He was referred by Dr. Nyoka Oneill, his PCP for preoperative clearance prior to upcoming elective laparoscopic cholecystectomy by Dr. Ninfa Oneill.  His risk factors include treated hyperlipidemia and diabetes.  His sister did have CABG.  He is never had a heart attack or stroke.  Denies chest pain or shortness of breath.  He does walk and exercise without limitation.  He had stage I colon cancer back in 2019 and had partial colectomy that was uncomplicated.  Apparently he needs elective laparoscopic cholecystectomy in the near future.   Current Meds  Medication Sig   aspirin EC 81 MG EC tablet Take 1 tablet (81 mg total) by mouth daily. Swallow whole.   atorvastatin (LIPITOR) 10 MG tablet Take 1 tablet (10 mg total) by mouth daily.   blood glucose meter kit and supplies Dispense based on patient and insurance preference. Use up to four times daily as directed. (FOR ICD-9 250.00, 250.01).   Lancets (ACCU-CHEK SOFT TOUCH) lancets Use as instructed   metFORMIN (GLUCOPHAGE) 1000 MG tablet Take 1 tablet (1,000 mg total) by mouth 2 (two) times daily with a meal.   Multiple Vitamin (MULTIVITAMIN WITH MINERALS) TABS tablet Take 1 tablet by mouth daily.   MYRBETRIQ 25 MG TB24 tablet Take 25 mg by mouth daily.   ONETOUCH ULTRA test strip CHECK BLOOD GLUCOSE (SUGAR) UP TO 4 TIMES DAILY   sitaGLIPtin (JANUVIA) 100 MG tablet Take 1 tablet (100 mg total) by mouth daily.   tamsulosin (FLOMAX) 0.4 MG CAPS capsule Take 0.4 mg by mouth in the morning and at bedtime.   traZODone (DESYREL) 100 MG tablet Take 1 tablet (100 mg total) by mouth at bedtime.     No Known  Allergies  Social History   Socioeconomic History   Marital status: Single    Spouse name: Not on file   Number of children: Not on file   Years of education: Not on file   Highest education level: Not on file  Occupational History   Not on file  Tobacco Use   Smoking status: Never   Smokeless tobacco: Never  Vaping Use   Vaping Use: Never used  Substance and Sexual Activity   Alcohol use: Not Currently   Drug use: Never   Sexual activity: Yes  Other Topics Concern   Not on file  Social History Narrative   ** Merged History Encounter **       Social Determinants of Health   Financial Resource Strain: Not on file  Food Insecurity: Not on file  Transportation Needs: Not on file  Physical Activity: Not on file  Stress: Not on file  Social Connections: Not on file  Intimate Partner Violence: Not on file     Review of Systems: General: negative for chills, fever, night sweats or weight changes.  Cardiovascular: negative for chest pain, dyspnea on exertion, edema, orthopnea, palpitations, paroxysmal nocturnal dyspnea or shortness of breath Dermatological: negative for rash Respiratory: negative for cough or wheezing Urologic: negative for hematuria Abdominal: negative for nausea, vomiting, diarrhea, bright red blood per rectum, melena, or hematemesis Neurologic: negative for visual changes, syncope, or dizziness All other systems reviewed and  are otherwise negative except as noted above.    Blood pressure (!) 136/58, pulse 62, height $RemoveBe'5\' 6"'JEEFhzKNn$  (1.676 m), weight 175 lb (79.4 kg).  General appearance: alert and no distress Neck: no adenopathy, no carotid bruit, no JVD, supple, symmetrical, trachea midline, and thyroid not enlarged, symmetric, no tenderness/mass/nodules Lungs: clear to auscultation bilaterally Heart: regular rate and rhythm, S1, S2 normal, no murmur, click, rub or gallop Extremities: extremities normal, atraumatic, no cyanosis or edema Pulses: 2+ and  symmetric Skin: Skin color, texture, turgor normal. No rashes or lesions Neurologic: Grossly normal  EKG sinus rhythm at 62 without ST or T wave changes.  I personally reviewed this EKG.  ASSESSMENT AND PLAN:   Essential hypertension History of essential hypertension currently not on antihypertensive medications with blood pressure measured today at 136/58.  Hyperlipidemia History of hyperlipidemia on statin therapy lipid profile performed 11/08/2020 revealing total cholesterol 179, LDL 115 HDL 49.  Preoperative clearance [name] was sent over for preoperative clearance before elective laparoscopic cholecystectomy.  His risk factors include treated hyperlipidemia and diabetes.  His sister did have CABG.  He is never had a heart attack or stroke.  He denies chest pain or shortness of breath.  He did have a chest CT performed 01/18/2021 that did show coronary calcifications.  He had partial colectomy in 2019 for colon cancer and did well during the surgery.  I am clearing him for his upcoming cholecystectomy at low risk.     William Harp MD FACP,FACC,FAHA, William Oneill 05/07/2021 2:05 PM

## 2021-05-07 NOTE — Patient Instructions (Signed)
Medication Instructions:  Your physician recommends that you continue on your current medications as directed. Please refer to the Current Medication list given to you today.  *If you need a refill on your cardiac medications before your next appointment, please call your pharmacy*   Follow-Up: At [facility], you and your health needs are our priority.  As part of our continuing mission to provide you with exceptional heart care, we have created designated Provider Care Teams.  These Care Teams include your primary Cardiologist (physician) and Advanced Practice Providers (APPs -  Physician Assistants and Nurse Practitioners) who all work together to provide you with the care you need, when you need it.  We recommend signing up for the patient portal called "MyChart".  Sign up information is provided on this After Visit Summary.  MyChart is used to connect with patients for Virtual Visits (Telemedicine).  Patients are able to view lab/test results, encounter notes, upcoming appointments, etc.  Non-urgent messages can be sent to your provider as well.   To learn more about what you can do with MyChart, go to [email or web address].    Your next appointment:   We will see you on an as needed basis.   Provider:   Quay Burow, MD   Other Instructions You are cleared at low cardiac risk for laparoscopic cholecystectomy. Please have Dr. Trevor Mace office send a pre-op clearance over to our office.

## 2021-05-07 NOTE — Assessment & Plan Note (Signed)
History of essential hypertension currently not on antihypertensive medications with blood pressure measured today at 136/58.

## 2021-05-07 NOTE — Assessment & Plan Note (Signed)
William Oneill was sent over for preoperative clearance before elective laparoscopic cholecystectomy.  His risk factors include treated hyperlipidemia and diabetes.  His sister did have CABG.  He is never had a heart attack or stroke.  He denies chest pain or shortness of breath.  He did have a chest CT performed 01/18/2021 that did show coronary calcifications.  He had partial colectomy in 2019 for colon cancer and did well during the surgery.  I am clearing him for his upcoming cholecystectomy at low risk.

## 2021-05-07 NOTE — Assessment & Plan Note (Signed)
History of hyperlipidemia on statin therapy lipid profile performed 11/08/2020 revealing total cholesterol 179, LDL 115 HDL 49.

## 2021-05-09 NOTE — Progress Notes (Signed)
Anesthesia Follow-up:  Case: [id number] Date/Time: 05/22/21 1230   Procedure: LAPAROSCOPIC CHOLECYSTECTOMY   Anesthesia type: General   Pre-op diagnosis: SYMPTOMATIC GALLSTONES   Location: [place] OR ROOM 09 / [place] OR   Surgeons: William Keens, MD       DISCUSSION: Patient is a 75 year old male scheduled for the above procedure. Surgery was initially scheduled for 05/02/21, but rescheduled to 05/22/21 after his transportation developed COVID-19.    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  Admission 11/07/20-11/09/20 for vertigo/near syncope with transient diplopia. MRI brain negative for acute process. 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 Neurology recommended vestibular rehab for BPPV and future 30 day event monitor for 3rd nerve palsy, although thought related to DM and not likely aneurysm by CTA.   He had ED visit 02/12/21 for midsternal chest pain radiating to both shoulder and associated with nausea. No exertional effect on symptoms. No pleuritic chest pain. Chest pain not responsive to Nitro, but resolved in ED. Troponin negative x2. Elevated bilirubin 2.4 (up from 1.9; normal AST/ALT), so RUQ [place] ordered which showed cholelithiasis, 8 mm common bile duct but likely normal for age. CXR did show vascular congestion concerning for CHF. He was felt stable for discharge home with out-patient follow-up PCP.   He had PCP follow-up with Dr. Carlota Oneill on 02/21/21. No further chest pain.  Occasional upper abdominal pain, worse with spicy food. Had seen Dr. Benson Oneill 02/13/21 and referred to general surgery as symptoms thought due to gallbladder. Given pulmonary vascular congestion on CXR and chest/epigastric pain, Dr. Carlota Oneill did refer to cardiology. BNP was normal at 59 on 02/21/21. When I reached out to Dr. Carlota Oneill in September, his preference was for William Oneill to get his cardiology evaluation prior to cholecystectomy. Since then he had an evaluation by Dr. Gwenlyn Oneill on 05/07/21. Dr. Gwenlyn Oneill wrote, "Preoperative clearance William Oneill was sent over for preoperative clearance before elective laparoscopic cholecystectomy.  His risk factors include treated hyperlipidemia and diabetes.  His sister did have CABG.  He is never had a heart attack or stroke.  He denies chest pain or shortness of breath.  He did have a chest CT performed 01/18/2021 that did show coronary calcifications.  He had partial colectomy in 2019 for colon cancer and did well during the surgery.  I am clearing him for his upcoming cholecystectomy at low risk."  His 04/24/21 CBC and BMET are within 30 days of 05/22/21 surgery date. EKG is from 05/07/21. Anesthesia team to evaluate on the day of surgery.   VS: BP 123/70   Pulse 73   Temp 36.8 C   Resp 19   Ht 5\' 6"  (1.676 m)   Wt 77.1 kg   SpO2 97%   BMI 27.44 kg/m    PROVIDERS: William Agreste, MD is PCP  William Merle, MD is HEM-ONC William Ada, MD is GI William Burow, MD is cardiologist. As needed follow-up recommended after 05/07/21 preoperative evaluation visit.    LABS: Labs reviewed: Acceptable for surgery.  A1c 6.7% 03/20/21.  (all labs ordered are listed, but only abnormal results are displayed)  Labs Reviewed  GLUCOSE, CAPILLARY - Abnormal; Notable for the following components:      Result Value   Glucose-Capillary 250 (*)    All other components within normal limits  BASIC METABOLIC PANEL - Abnormal; Notable for the following components:   Glucose, Bld 239 (*)     Calcium 8.7 (*)    All other components within normal limits  CBC    IMAGES: CXR 02/22/21: FINDINGS: Lungs clear. Heart size normal. Aortic atherosclerosis. No pneumothorax or pleural fluid. No acute or focal bony abnormality IMPRESSION: No acute disease. Aortic Atherosclerosis (ICD10-I70.0).   [place] Abd RUQ 02/12/21: IMPRESSION: Cholelithiasis without complicating factors.   1V PCXR 02/12/21: FINDINGS: Cardiac shadow is mildly enlarged but stable. Aortic calcifications are noted. Generalized vascular congestion is noted centrally with mild interstitial edema. No focal infiltrate or effusion is noted. No acute bony abnormality is seen. IMPRESSION: Changes consistent with CHF   CT Chest/abd/pelvis 01/18/21: IMPRESSION: No findings to suggest recurrent or metastatic disease within the chest, abdomen or pelvis.     EKG: 05/07/21: Normal sinus rhythm Nonspecific ST abnormality    CV: Nuclear stress test 10/26/04  IMPRESSION:    1. No stress induced ischemia.  2. Ejection fraction 60 percent.  3. Fixed thinning of the anterior wall.     Past Medical History:  Diagnosis Date   A-fib [facility])    "years ago, for a short period, never came back"   Anemia    Cancer ([place])    Cancer ([place])    colon cancer   Colon cancer ([place])    Diabetes mellitus without complication ([place])    type 2   Diabetes mellitus without complication ([place])    Dysrhythmia    High cholesterol    History of kidney stones    passed   Insomnia    Pneumonia    x1    Past Surgical History:  Procedure Laterality Date   COLON SURGERY     laparoscopic ascending colectomy  Dr. Barry Dienes 02-02-18   COLONOSCOPY     LAPAROSCOPIC PARTIAL COLECTOMY N/A 02/02/2018   Procedure: LAPAROSCOPIC ASCENDING COLECTOMY;  Surgeon: Stark Klein, MD;  Location: [facility];  Service: General;  Laterality: N/A;   PORT-A-CATH REMOVAL Left 02/23/2020   Procedure: REMOVAL PORT-A-CATH;  Surgeon: Stark Klein, MD;  Location: [place];   Service: General;  Laterality: Left;   PORTACATH PLACEMENT Left 03/03/2018   Procedure: INSERTION PORT-A-CATH;  Surgeon: Stark Klein, MD;  Location: [place];  Service: General;  Laterality: Left;   TONSILLECTOMY     as a child    MEDICATIONS:  aspirin EC 81 MG EC tablet   atorvastatin (LIPITOR) 10 MG tablet   blood glucose meter kit and supplies   Lancets (ACCU-CHEK SOFT TOUCH) lancets   metFORMIN (GLUCOPHAGE) 1000 MG tablet   Multiple Vitamin (MULTIVITAMIN WITH MINERALS) TABS tablet   MYRBETRIQ 25 MG TB24 tablet   ONETOUCH ULTRA test strip   sitaGLIPtin (JANUVIA) 100 MG tablet   tamsulosin (FLOMAX) 0.4 MG CAPS capsule   traZODone (DESYREL) 100 MG tablet   No current facility-administered medications for this encounter.    Myra Gianotti, PA-C Surgical Short Stay/Anesthesiology [facility] Phone [phone] [facility] Phone [phone] 05/09/2021 3:56 PM

## 2021-05-09 NOTE — Anesthesia Preprocedure Evaluation (Addendum)
Anesthesia Evaluation  Patient identified by MRN, date of birth, ID band Patient awake    Reviewed: Allergy & Precautions, NPO status , Patient's Chart, lab work & pertinent test results  Airway Mallampati: III  TM Distance: >3 FB Neck ROM: Full    Dental  (+) Missing,    Pulmonary neg pulmonary ROS,    Pulmonary exam normal breath sounds clear to auscultation       Cardiovascular negative cardio ROS Normal cardiovascular exam Rhythm:Regular Rate:Normal  ECG: NSR, rate 62   Neuro/Psych negative neurological ROS  negative psych ROS   GI/Hepatic negative GI ROS, Neg liver ROS,   Endo/Other  diabetes, Oral Hypoglycemic Agents  Renal/GU negative Renal ROS     Musculoskeletal negative musculoskeletal ROS (+)   Abdominal   Peds  Hematology HLD   Anesthesia Other Findings SYMPTOMATIC GALLSTONES  Reproductive/Obstetrics                            Anesthesia Physical Anesthesia Plan  ASA: 2  Anesthesia Plan: General   Post-op Pain Management:    Induction: Intravenous  PONV Risk Score and Plan: 3 and Ondansetron, Dexamethasone and Treatment may vary due to age or medical condition  Airway Management Planned: Oral ETT  Additional Equipment:   Intra-op Plan:   Post-operative Plan: Extubation in OR  Informed Consent: I have reviewed the patients History and Physical, chart, labs and discussed the procedure including the risks, benefits and alternatives for the proposed anesthesia with the patient or authorized representative who has indicated his/her understanding and acceptance.     Dental advisory given  Plan Discussed with: CRNA  Anesthesia Plan Comments: (Reviewed PAT note written by Myra Gianotti, PA-C. Has preoperative cardiology input by Dr. Quay Burow from 05/07/21 visit.  )       Anesthesia Quick Evaluation

## 2021-05-21 ENCOUNTER — Encounter (HOSPITAL_COMMUNITY): Payer: Self-pay | Admitting: Surgery

## 2021-05-21 ENCOUNTER — Other Ambulatory Visit: Payer: Self-pay
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
## 2021-05-21 NOTE — Progress Notes (Signed)
Patient was instructed to be in short stay tomorrow, 05/22/2021 @ 10:15 o'clock for his surgery. Patient aware he must not eat anything after midnight, but he can have clear liquids until tomorrow morning @ 09:45. Patient was instructed to have a shower tonight and tomorrow morning.  MVH:QIONGEX Carlota Raspberry, MD Cardiologist: Quay Burow, MD   EKG: 05/07/2021 CXR: 02/22/21 ECHO: denies Stress Test: denies Cardiac Cath: denies   Fasting Blood Sugar- 167-182 Checks Blood Sugar - twice a day   OSA/CPAP: No   ASA: last day on 05/20/2021 per patient Blood Thinner: No  Patient was instructed: Patient was instructed: As of today, STOP taking any Aspirin (unless otherwise instructed by your surgeon) Aleve, Naproxen, Ibuprofen, Motrin, Advil, Goody's, BC's, all herbal medications, fish oil, and all vitamins.   Patient was instructed to take tomorrow morning Lipitor, Norvasc, and Myrbetriq, but not Metformin and Januvia.  Patient was instructed to check his CBG in the morning and every 2hrs until he will come in short stay. Treat a low blood sugar (less than 70 mg/dL) with  cup of clear juice (cranberry or apple), 4 glucose tablets, OR glucose gel. Recheck blood sugar in 15 minutes after treatment (to make sure it is greater than 70 mg/dL). If your blood sugar is not greater than 70 mg/dL on recheck, call [phone] for further instructions.  Covid test not needed (ambulatory surgery)   Anesthesia Review: Yes, history afib. Toni Arthurs will check patient's chart.   Patient denies shortness of breath, fever, cough, and chest pain at PAT appointment.   Patient verbalized understanding of instructions provided today. All questions were answered.

## 2021-05-22 ENCOUNTER — Encounter (HOSPITAL_COMMUNITY)
Admission: RE | Disposition: A | Discharge status: Home / Self Care | Payer: Self-pay | Source: Home / Self Care | Attending: Surgery

## 2021-05-22 ENCOUNTER — Ambulatory Visit (HOSPITAL_COMMUNITY)
Admission: RE | Admit: 2021-05-22 | Discharge: 2021-05-22 | Disposition: A | Discharge status: Home / Self Care | Payer: Medicare Other | Attending: Surgery | Admitting: Surgery

## 2021-05-22 ENCOUNTER — Ambulatory Visit (HOSPITAL_COMMUNITY): Payer: Medicare Other | Admitting: Vascular Surgery

## 2021-05-22 ENCOUNTER — Ambulatory Visit (HOSPITAL_COMMUNITY): Payer: Medicare Other | Admitting: Registered Nurse

## 2021-05-22 ENCOUNTER — Other Ambulatory Visit: Payer: Self-pay

## 2021-05-22 ENCOUNTER — Telehealth: Payer: Self-pay | Admitting: Family Medicine

## 2021-05-22 ENCOUNTER — Encounter (HOSPITAL_COMMUNITY): Payer: Self-pay | Admitting: Surgery

## 2021-05-22 DIAGNOSIS — Z7984 Long term (current) use of oral hypoglycemic drugs: Secondary | ICD-10-CM | POA: Insufficient documentation

## 2021-05-22 DIAGNOSIS — K8010 Calculus of gallbladder with chronic cholecystitis without obstruction: Secondary | ICD-10-CM | POA: Diagnosis not present

## 2021-05-22 DIAGNOSIS — E119 Type 2 diabetes mellitus without complications: Secondary | ICD-10-CM | POA: Diagnosis not present

## 2021-05-22 DIAGNOSIS — K828 Other specified diseases of gallbladder: Secondary | ICD-10-CM | POA: Diagnosis not present

## 2021-05-22 DIAGNOSIS — Z79899 Other long term (current) drug therapy: Secondary | ICD-10-CM | POA: Insufficient documentation

## 2021-05-22 HISTORY — PX: CHOLECYSTECTOMY: SHX55

## 2021-05-22 LAB — GLUCOSE, CAPILLARY
Glucose-Capillary: 171 mg/dL — ABNORMAL HIGH (ref 70–99)
Glucose-Capillary: 145 mg/dL — ABNORMAL HIGH (ref 70–99)
Glucose-Capillary: 172 mg/dL — ABNORMAL HIGH (ref 70–99)

## 2021-05-22 SURGERY — LAPAROSCOPIC CHOLECYSTECTOMY
Anesthesia: General | Site: Abdomen

## 2021-05-22 MED ORDER — EPHEDRINE 5 MG/ML INJ
INTRAVENOUS | Status: AC
  Filled 2021-05-22: qty 5

## 2021-05-22 MED ORDER — DEXAMETHASONE SODIUM PHOSPHATE 10 MG/ML IJ SOLN
INTRAMUSCULAR | Status: AC
  Filled 2021-05-22: qty 1

## 2021-05-22 MED ORDER — EPHEDRINE SULFATE-NACL 50-0.9 MG/10ML-% IV SOSY
PREFILLED_SYRINGE | INTRAVENOUS | Status: DC | PRN
Start: 2021-05-22 — End: 2021-05-22
  Administered 2021-05-22: 5 mg via INTRAVENOUS

## 2021-05-22 MED ORDER — ONDANSETRON HCL 4 MG/2ML IJ SOLN
INTRAMUSCULAR | Status: AC
  Filled 2021-05-22: qty 2

## 2021-05-22 MED ORDER — CHLORHEXIDINE GLUCONATE 0.12 % MT SOLN
OROMUCOSAL | Status: AC
  Administered 2021-05-22: 15 mL
  Filled 2021-05-22: qty 15

## 2021-05-22 MED ORDER — LIDOCAINE 2% (20 MG/ML) 5 ML SYRINGE
INTRAMUSCULAR | Status: DC | PRN
Start: 2021-05-22 — End: 2021-05-22
  Administered 2021-05-22: 60 mg via INTRAVENOUS

## 2021-05-22 MED ORDER — SODIUM CHLORIDE 0.9 % IR SOLN
Status: DC | PRN
  Administered 2021-05-22: 1000 mL

## 2021-05-22 MED ORDER — LIDOCAINE 2% (20 MG/ML) 5 ML SYRINGE
INTRAMUSCULAR | Status: AC
  Filled 2021-05-22: qty 5

## 2021-05-22 MED ORDER — BUPIVACAINE-EPINEPHRINE 0.25% -1:200000 IJ SOLN
INTRAMUSCULAR | Status: DC | PRN
  Administered 2021-05-22: 20 mL

## 2021-05-22 MED ORDER — PHENYLEPHRINE 40 MCG/ML (10ML) SYRINGE FOR IV PUSH (FOR BLOOD PRESSURE SUPPORT)
PREFILLED_SYRINGE | INTRAVENOUS | Status: AC
  Filled 2021-05-22: qty 10

## 2021-05-22 MED ORDER — FENTANYL CITRATE (PF) 250 MCG/5ML IJ SOLN
INTRAMUSCULAR | Status: AC
  Filled 2021-05-22: qty 5

## 2021-05-22 MED ORDER — CEFAZOLIN SODIUM-DEXTROSE 2-4 GM/100ML-% IV SOLN
2.0000 g | INTRAVENOUS | Status: AC
  Administered 2021-05-22: 2 g via INTRAVENOUS
  Filled 2021-05-22: qty 100

## 2021-05-22 MED ORDER — FENTANYL CITRATE (PF) 250 MCG/5ML IJ SOLN
INTRAMUSCULAR | Status: DC | PRN
  Administered 2021-05-22: 100 ug via INTRAVENOUS
  Administered 2021-05-22: 50 ug via INTRAVENOUS
  Administered 2021-05-22: 75 ug via INTRAVENOUS
  Administered 2021-05-22: 25 ug via INTRAVENOUS

## 2021-05-22 MED ORDER — TRAMADOL HCL 50 MG PO TABS: 50.0000 mg | ORAL_TABLET | Freq: Four times a day (QID) | ORAL | 0 refills | Status: DC | PRN

## 2021-05-22 MED ORDER — SUCCINYLCHOLINE CHLORIDE 200 MG/10ML IV SOSY
PREFILLED_SYRINGE | INTRAVENOUS | Status: AC
  Filled 2021-05-22: qty 10

## 2021-05-22 MED ORDER — ONDANSETRON HCL 4 MG/2ML IJ SOLN
INTRAMUSCULAR | Status: DC | PRN
  Administered 2021-05-22: 4 mg via INTRAVENOUS

## 2021-05-22 MED ORDER — DEXAMETHASONE SODIUM PHOSPHATE 10 MG/ML IJ SOLN
INTRAMUSCULAR | Status: DC | PRN
  Administered 2021-05-22: 10 mg via INTRAVENOUS

## 2021-05-22 MED ORDER — LACTATED RINGERS IV SOLN: INTRAVENOUS | Status: DC | PRN

## 2021-05-22 MED ORDER — ONDANSETRON HCL 4 MG/2ML IJ SOLN: 4.0000 mg | Freq: Once | INTRAMUSCULAR | Status: DC | PRN

## 2021-05-22 MED ORDER — FENTANYL CITRATE (PF) 100 MCG/2ML IJ SOLN
25.0000 ug | INTRAMUSCULAR | Status: DC | PRN
  Administered 2021-05-22: 25 ug via INTRAVENOUS

## 2021-05-22 MED ORDER — FENTANYL CITRATE (PF) 100 MCG/2ML IJ SOLN
INTRAMUSCULAR | Status: AC
  Filled 2021-05-22: qty 2

## 2021-05-22 MED ORDER — CHLORHEXIDINE GLUCONATE CLOTH 2 % EX PADS: 6.0000 | MEDICATED_PAD | Freq: Once | CUTANEOUS | Status: DC

## 2021-05-22 MED ORDER — ROCURONIUM BROMIDE 10 MG/ML (PF) SYRINGE
PREFILLED_SYRINGE | INTRAVENOUS | Status: AC
  Filled 2021-05-22: qty 10

## 2021-05-22 MED ORDER — LACTATED RINGERS IV SOLN: INTRAVENOUS | Status: DC

## 2021-05-22 MED ORDER — ACETAMINOPHEN 500 MG PO TABS
1000.0000 mg | ORAL_TABLET | ORAL | Status: AC
  Administered 2021-05-22: 1000 mg via ORAL
  Filled 2021-05-22: qty 2

## 2021-05-22 MED ORDER — SUGAMMADEX SODIUM 200 MG/2ML IV SOLN
INTRAVENOUS | Status: DC | PRN
  Administered 2021-05-22: 200 mg via INTRAVENOUS

## 2021-05-22 MED ORDER — PROPOFOL 10 MG/ML IV BOLUS
INTRAVENOUS | Status: AC
  Filled 2021-05-22: qty 20

## 2021-05-22 MED ORDER — HEMOSTATIC AGENTS (NO CHARGE) OPTIME
TOPICAL | Status: DC | PRN
  Administered 2021-05-22: 1 via TOPICAL

## 2021-05-22 MED ORDER — BUPIVACAINE-EPINEPHRINE (PF) 0.25% -1:200000 IJ SOLN
INTRAMUSCULAR | Status: AC
  Filled 2021-05-22: qty 30

## 2021-05-22 MED ORDER — AMISULPRIDE (ANTIEMETIC) 5 MG/2ML IV SOLN: 10.0000 mg | Freq: Once | INTRAVENOUS | Status: DC | PRN

## 2021-05-22 MED ORDER — ROCURONIUM BROMIDE 10 MG/ML (PF) SYRINGE
PREFILLED_SYRINGE | INTRAVENOUS | Status: DC | PRN
  Administered 2021-05-22: 10 mg via INTRAVENOUS
  Administered 2021-05-22: 50 mg via INTRAVENOUS

## 2021-05-22 MED ORDER — LIDOCAINE 2% (20 MG/ML) 5 ML SYRINGE
INTRAMUSCULAR | Status: AC
  Filled 2021-05-22: qty 10

## 2021-05-22 MED ORDER — PROPOFOL 10 MG/ML IV BOLUS
INTRAVENOUS | Status: DC | PRN
  Administered 2021-05-22: 150 mg via INTRAVENOUS

## 2021-05-22 SURGICAL SUPPLY — 38 items
ADH SKN CLS APL DERMABOND .7 (GAUZE/BANDAGES/DRESSINGS) ×1
APL PRP STRL LF DISP 70% ISPRP (MISCELLANEOUS) ×1
APPLIER CLIP 5 13 M/L LIGAMAX5 (MISCELLANEOUS) ×2
APR CLP MED LRG 5 ANG JAW (MISCELLANEOUS) ×1
BAG COUNTER SPONGE SURGICOUNT (BAG) ×2 IMPLANT
BAG SPEC RTRVL LRG 6X4 10 (ENDOMECHANICALS) ×1
BAG SPNG CNTER NS LX DISP (BAG) ×1
CANISTER SUCT 3000ML PPV (MISCELLANEOUS) ×2 IMPLANT
CHLORAPREP W/TINT 26 (MISCELLANEOUS) ×2 IMPLANT
CLIP APPLIE 5 13 M/L LIGAMAX5 (MISCELLANEOUS) ×1 IMPLANT
COVER SURGICAL LIGHT HANDLE (MISCELLANEOUS) ×2 IMPLANT
DERMABOND ADVANCED (GAUZE/BANDAGES/DRESSINGS) ×1
DERMABOND ADVANCED .7 DNX12 (GAUZE/BANDAGES/DRESSINGS) ×1 IMPLANT
ELECT REM PT RETURN 9FT ADLT (ELECTROSURGICAL) ×2
ELECTRODE REM PT RTRN 9FT ADLT (ELECTROSURGICAL) ×1 IMPLANT
GLOVE SURG SIGNA 7.5 PF LTX (GLOVE) ×2 IMPLANT
GOWN STRL REUS W/ TWL LRG LVL3 (GOWN DISPOSABLE) ×2 IMPLANT
GOWN STRL REUS W/ TWL XL LVL3 (GOWN DISPOSABLE) ×1 IMPLANT
GOWN STRL REUS W/TWL LRG LVL3 (GOWN DISPOSABLE) ×4
GOWN STRL REUS W/TWL XL LVL3 (GOWN DISPOSABLE) ×4
HEMOSTAT SNOW SURGICEL 2X4 (HEMOSTASIS) ×1 IMPLANT
KIT BASIN OR (CUSTOM PROCEDURE TRAY) ×2 IMPLANT
KIT TURNOVER KIT B (KITS) ×2 IMPLANT
NS IRRIG 1000ML POUR BTL (IV SOLUTION) ×2 IMPLANT
PAD ARMBOARD 7.5X6 YLW CONV (MISCELLANEOUS) ×2 IMPLANT
POUCH SPECIMEN RETRIEVAL 10MM (ENDOMECHANICALS) ×2 IMPLANT
SCISSORS LAP 5X35 DISP (ENDOMECHANICALS) ×2 IMPLANT
SET IRRIG TUBING LAPAROSCOPIC (IRRIGATION / IRRIGATOR) ×2 IMPLANT
SET TUBE SMOKE EVAC HIGH FLOW (TUBING) ×2 IMPLANT
SLEEVE ENDOPATH XCEL 5M (ENDOMECHANICALS) ×4 IMPLANT
SPECIMEN JAR SMALL (MISCELLANEOUS) ×2 IMPLANT
SUT MNCRL AB 4-0 PS2 18 (SUTURE) ×2 IMPLANT
TOWEL GREEN STERILE (TOWEL DISPOSABLE) ×2 IMPLANT
TOWEL GREEN STERILE FF (TOWEL DISPOSABLE) ×2 IMPLANT
TRAY LAPAROSCOPIC MC (CUSTOM PROCEDURE TRAY) ×2 IMPLANT
TROCAR XCEL BLUNT TIP 100MML (ENDOMECHANICALS) ×2 IMPLANT
TROCAR XCEL NON-BLD 5MMX100MML (ENDOMECHANICALS) ×2 IMPLANT
WATER STERILE IRR 1000ML POUR (IV SOLUTION) ×1 IMPLANT

## 2021-05-22 NOTE — Interval H&P Note (Signed)
History and Physical Interval Note:no change in H and P  05/22/2021 12:32 PM  William Oneill  has presented today for surgery, with the diagnosis of SYMPTOMATIC GALLSTONES.  The various methods of treatment have been discussed with the patient and family. After consideration of risks, benefits and other options for treatment, the patient has consented to  Procedure(s): LAPAROSCOPIC CHOLECYSTECTOMY (N/A) as a surgical intervention.  The patient's history has been reviewed, patient examined, no change in status, stable for surgery.  I have reviewed the patient's chart and labs.  Questions were answered to the patient's satisfaction.     Coralie Keens

## 2021-05-22 NOTE — Transfer of Care (Signed)
Immediate Anesthesia Transfer of Care Note  Patient: William Oneill  Procedure(s) Performed: LAPAROSCOPIC CHOLECYSTECTOMY (Abdomen)  Patient Location: PACU  Anesthesia Type:General  Level of Consciousness: awake, alert  and oriented  Airway & Oxygen Therapy:  Spontaneous; room air  Post-op Assessment: Report given to RN and Post -op Vital signs reviewed and stable  Post vital signs: Reviewed and stable  Last Vitals:  Vitals Value Taken Time  BP 153/60 05/22/21 1610  Temp 36.4 C 05/22/21 1610  Pulse 62 05/22/21 1613  Resp 20 05/22/21 1613  SpO2 96 % 05/22/21 1613  Vitals shown include unvalidated device data.  Last Pain:  Vitals:   05/22/21 1053  TempSrc:   PainSc: 0-No pain         Complications: No notable events documented.

## 2021-05-22 NOTE — Op Note (Signed)
Laparoscopic Cholecystectomy Procedure Note  Indications: This patient presents with symptomatic gallbladder disease and will undergo laparoscopic cholecystectomy.  Pre-operative Diagnosis: Chronic cholecystitis  Post-operative Diagnosis: Chronic cholecystitis  Surgeon: Coralie Keens, MD  Assistants: Vernell Leep, MD  Anesthesia: General endotracheal anesthesia  ASA Class: 2  Procedure Details  The patient was seen again in the Holding Room. The risks, benefits, complications, treatment options, and expected outcomes were discussed with the patient. The possibilities of reaction to medication, pulmonary aspiration, perforation of viscus, bleeding, recurrent infection, finding a normal gallbladder, the need for additional procedures, failure to diagnose a condition, the possible need to convert to an open procedure, and creating a complication requiring transfusion or operation were discussed with the patient. The likelihood of improving the patient's symptoms with return to their baseline status is good.  The patient and/or family concurred with the proposed plan, giving informed consent. The site of surgery properly noted. The patient was taken to Operating Room, identified as Arno Cullers and the procedure verified as Laparoscopic Cholecystectomy. A Time Out was held and the above information confirmed.  Prior to the induction of general anesthesia, antibiotic prophylaxis was administered. General endotracheal anesthesia was then administered and tolerated well. After the induction, the abdomen was prepped with Chloraprep and draped in sterile fashion. The patient was positioned in the supine position.  Local anesthetic agent was injected into the skin near the umbilicus and an incision made. We dissected down to the abdominal fascia with blunt dissection.  The fascia was incised vertically and we entered the peritoneal cavity bluntly.  A pursestring suture of 0-Vicryl was placed  around the fascial opening.  The Hasson cannula was inserted and secured with the stay suture.  Pneumoperitoneum was then created with CO2 and tolerated well without any adverse changes in the patient's vital signs. A 5-mm port was placed in the subxiphoid position.  Two 5-mm ports were placed in the right upper quadrant.   We positioned the patient in reverse Trendelenburg, tilted slightly to the patient's left.  The gallbladder was identified, the fundus grasped and retracted cephalad. Adhesions were lysed bluntly and with the electrocautery where indicated, taking care not to injure any adjacent organs or viscus. The infundibulum was grasped and retracted laterally, exposing the peritoneum overlying the triangle of Calot. This was then divided and exposed in a blunt fashion. The cystic artery was dissected free, clipped twice on the patient's side and once on the specimen side, and divided. The cystic duct was then dissected free, ligated with clips, and divided.  The gallbladder was dissected from the liver bed in retrograde fashion with the electrocautery. The gallbladder was removed and placed in an Endocatch sac. The liver bed was irrigated and inspected. Hemostasis was achieved with the electrocautery. Copious irrigation was utilized and was repeatedly aspirated until clear.  The gallbladder and Endocatch sac were then removed through the umbilical port site.  The pursestring suture was used to close the umbilical fascia.    We again inspected the right upper quadrant for hemostasis.  Pneumoperitoneum was released as we removed the trocars.  20cc of 0.25% marcaine with epinephrine was equally distributed across all of the port sites.  4-0 Monocryl was used to close the skin.   Skin glue was then applied. The patient was then extubated and brought to the recovery room in stable condition. Instrument, sponge, and needle counts were correct at closure and at the conclusion of the case.    Findings: Cholecystitis with Cholelithiasis  Estimated Blood Loss: less than 50 mL         Specimens: Gallbladder           Complications: None; patient tolerated the procedure well.         Disposition: PACU - hemodynamically stable.         Condition: stable   Douglas A. Ninfa Linden, MD CCS

## 2021-05-22 NOTE — Anesthesia Postprocedure Evaluation (Signed)
Anesthesia Post Note  Patient: Ival Pacer  Procedure(s) Performed: LAPAROSCOPIC CHOLECYSTECTOMY (Abdomen)     Patient location during evaluation: PACU Anesthesia Type: General Level of consciousness: awake Pain management: pain level controlled Vital Signs Assessment: post-procedure vital signs reviewed and stable Respiratory status: spontaneous breathing, nonlabored ventilation, respiratory function stable and patient connected to nasal cannula oxygen Cardiovascular status: blood pressure returned to baseline and stable Postop Assessment: no apparent nausea or vomiting Anesthetic complications: no   No notable events documented.  Last Vitals:  Vitals:   05/22/21 1655 05/22/21 1710  BP: 136/61 131/62  Pulse: 61 60  Resp: 15 14  Temp:  36.7 C  SpO2: 95% 96%    Last Pain:  Vitals:   05/22/21 1655  TempSrc:   PainSc: 6                  Ashya Nicolaisen P Woodward Klem

## 2021-05-22 NOTE — Discharge Instructions (Signed)
[facility] ______[facility] LAPAROSCOPIC SURGERY: POST OP INSTRUCTIONS Always review your discharge instruction sheet given to you by the facility where your surgery was performed. IF YOU HAVE DISABILITY OR FAMILY LEAVE FORMS, YOU MUST BRING THEM TO THE OFFICE FOR PROCESSING.   DO NOT GIVE THEM TO YOUR DOCTOR.  A prescription for pain medication may be given to you upon discharge.  Take your pain medication as prescribed, if needed.  If narcotic pain medicine is not needed, then you may take acetaminophen (Tylenol) or ibuprofen (Advil) as needed. Take your usually prescribed medications unless otherwise directed. If you need a refill on your pain medication, please contact your pharmacy.  They will contact our office to request authorization. Prescriptions will not be filled after 5pm or on week-ends. You should follow a light diet the first few days after arrival home, such as soup and crackers, etc.  Be sure to include lots of fluids daily. Most patients will experience some swelling and bruising in the area of the incisions.  Ice packs will help.  Swelling and bruising can take several days to resolve.  It is common to experience some constipation if taking pain medication after surgery.  Increasing fluid intake and taking a stool softener (such as Colace) will usually help or prevent this problem from occurring.  A mild laxative (Milk of Magnesia or Miralax) should be taken according to package instructions if there are no bowel movements after 48 hours. Unless discharge instructions indicate otherwise, you may remove your bandages 24-48 hours after surgery, and you may shower at that time.  You may have steri-strips (small skin tapes) in place directly over the incision.  These strips should be left on the skin for 7-10 days.  If your surgeon used skin glue on the incision, you may shower in 24 hours.  The glue will flake off over the next 2-3 weeks.  Any sutures or staples will be  removed at the office during your follow-up visit. ACTIVITIES:  You may resume regular (light) daily activities beginning the next day--such as daily self-care, walking, climbing stairs--gradually increasing activities as tolerated.  You may have sexual intercourse when it is comfortable.  Refrain from any heavy lifting or straining until approved by your doctor. You may drive when you are no longer taking prescription pain medication, you can comfortably wear a seatbelt, and you can safely maneuver your car and apply brakes. RETURN TO WORK:  __________________________________________________________ Dennis Bast should see your doctor in the office for a follow-up appointment approximately 2-3 weeks after your surgery.  Make sure that you call for this appointment within a day or two after you arrive home to insure a convenient appointment time. OTHER INSTRUCTIONS: OK TO SHOWER STARTING TOMORROW ICE PACK, TYLENOL, AND IBUPROFEN ALSO FOR PAIN NO LIFTING MORE THAN 15 TO 20 POUNDS FOR 2 WEEKS __________________________________________________________________________________________________________________________ __________________________________________________________________________________________________________________________ WHEN TO CALL YOUR DOCTOR: Fever over 101.0 Inability to urinate Continued bleeding from incision. Increased pain, redness, or drainage from the incision. Increasing abdominal pain  The clinic staff is available to answer your questions during regular business hours.  Please don't hesitate to call and ask to speak to one of the nurses for clinical concerns.  If you have a medical emergency, go to the nearest emergency room or call 911.  A surgeon from [facility] is always on call at the hospital. [address] [place], [place], [place]  [address] ? [address], [place],    [address] [phone] ?  [phone] ? FAX [phone] Web site:  [email or web address]

## 2021-05-22 NOTE — Anesthesia Procedure Notes (Signed)
Procedure Name: Intubation Date/Time: 05/22/2021 2:53 PM Performed by: Jearld Pies, CRNA Pre-anesthesia Checklist: Patient identified, Emergency Drugs available, Suction available and Patient being monitored Patient Re-evaluated:Patient Re-evaluated prior to induction Oxygen Delivery Method: Circle System Utilized Preoxygenation: Pre-oxygenation with 100% oxygen Induction Type: IV induction Ventilation: Mask ventilation without difficulty and Oral airway inserted - appropriate to patient size Laryngoscope Size: Sabra Heck and 2 Grade View: Grade I Tube type: Oral Tube size: 7.0 mm Number of attempts: 1 Airway Equipment and Method: Stylet and Oral airway Placement Confirmation: ETT inserted through vocal cords under direct vision, positive ETCO2 and breath sounds checked- equal and bilateral Secured at: 21 cm Tube secured with: Tape Dental Injury: Teeth and Oropharynx as per pre-operative assessment

## 2021-05-22 NOTE — Telephone Encounter (Signed)
Left message for patient to call back and schedule Medicare Annual Wellness Visit (AWV) .   Please offer to do virtually or by telephone.  Left office number and my jabber #[phone].  Last AWV:06/08/2019   Please schedule at anytime with Nurse Health Advisor.  

## 2021-05-23 ENCOUNTER — Encounter (HOSPITAL_COMMUNITY): Payer: Self-pay | Admitting: Surgery

## 2021-05-24 LAB — SURGICAL PATHOLOGY

## 2021-06-17 ENCOUNTER — Ambulatory Visit: Payer: Medicare Other | Admitting: Cardiovascular Disease

## 2021-06-20 ENCOUNTER — Encounter: Payer: Self-pay | Admitting: Family Medicine

## 2021-06-20 ENCOUNTER — Ambulatory Visit (INDEPENDENT_AMBULATORY_CARE_PROVIDER_SITE_OTHER): Payer: Medicare Other | Admitting: Family Medicine

## 2021-06-20 VITALS — BP 134/68 | HR 74 | Temp 98.4°F | Resp 16 | Ht 66.0 in | Wt 173.4 lb

## 2021-06-20 DIAGNOSIS — E1165 Type 2 diabetes mellitus with hyperglycemia: Secondary | ICD-10-CM | POA: Diagnosis not present

## 2021-06-20 DIAGNOSIS — R809 Proteinuria, unspecified: Secondary | ICD-10-CM

## 2021-06-20 DIAGNOSIS — I700 Atherosclerosis of aorta: Secondary | ICD-10-CM

## 2021-06-20 DIAGNOSIS — Z794 Long term (current) use of insulin: Secondary | ICD-10-CM

## 2021-06-20 DIAGNOSIS — E1129 Type 2 diabetes mellitus with other diabetic kidney complication: Secondary | ICD-10-CM

## 2021-06-20 DIAGNOSIS — E785 Hyperlipidemia, unspecified: Secondary | ICD-10-CM

## 2021-06-20 DIAGNOSIS — G4700 Insomnia, unspecified: Secondary | ICD-10-CM | POA: Diagnosis not present

## 2021-06-20 MED ORDER — ATORVASTATIN CALCIUM 10 MG PO TABS: 10.0000 mg | ORAL_TABLET | Freq: Every day | ORAL | 1 refills | Status: DC

## 2021-06-20 MED ORDER — METFORMIN HCL 1000 MG PO TABS: 1000.0000 mg | ORAL_TABLET | Freq: Two times a day (BID) | ORAL | 1 refills | Status: DC

## 2021-06-20 MED ORDER — SITAGLIPTIN PHOSPHATE 100 MG PO TABS: 100.0000 mg | ORAL_TABLET | Freq: Every day | ORAL | 1 refills | Status: DC

## 2021-06-20 MED ORDER — TRAZODONE HCL 100 MG PO TABS: 100.0000 mg | ORAL_TABLET | Freq: Every day | ORAL | 1 refills | Status: DC
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
## 2021-06-20 NOTE — Patient Instructions (Addendum)
I will check the urine test next visit, no change in medications at this time.  Follow-up in 3 months for diabetes.  If stable at that time we can shift your follow-up appointments to every 6 months.  Let me know if there are questions and take care.

## 2021-06-21 ENCOUNTER — Encounter: Payer: Self-pay | Admitting: Hematology

## 2021-06-21 LAB — LIPID PANEL
Cholesterol: 203 mg/dL — ABNORMAL HIGH (ref 0–200)
HDL: 59.9 mg/dL (ref >39.00)
LDL Cholesterol: 121 mg/dL — ABNORMAL HIGH (ref 0–99)
NonHDL: 142.7
Total CHOL/HDL Ratio: 3
Triglycerides: 107 mg/dL (ref 0.0–149.0)
VLDL: 21.4 mg/dL (ref 0.0–40.0)

## 2021-06-21 LAB — COMPREHENSIVE METABOLIC PANEL
ALT: 19 U/L (ref 0–53)
AST: 19 U/L (ref 0–37)
Albumin: 4.6 g/dL (ref 3.5–5.2)
Alkaline Phosphatase: 89 U/L (ref 39–117)
BUN: 23 mg/dL (ref 6–23)
CO2: 27 mEq/L (ref 19–32)
Calcium: 9.5 mg/dL (ref 8.4–10.5)
Chloride: 103 mEq/L (ref 96–112)
Creatinine, Ser: 0.9 mg/dL (ref 0.40–1.50)
GFR: 84.34 mL/min (ref >60.00)
Glucose, Bld: 263 mg/dL — ABNORMAL HIGH (ref 70–99)
Potassium: 4.3 mEq/L (ref 3.5–5.1)
Sodium: 139 mEq/L (ref 135–145)
Total Bilirubin: 1.4 mg/dL — ABNORMAL HIGH (ref 0.2–1.2)
Total Protein: 7 g/dL (ref 6.0–8.3)

## 2021-06-21 LAB — HEMOGLOBIN A1C: Hgb A1c MFr Bld: 7.7 % — ABNORMAL HIGH (ref 4.6–6.5)

## 2021-06-24 ENCOUNTER — Ambulatory Visit: Payer: Medicare Other | Admitting: Internal Medicine

## 2021-07-24 ENCOUNTER — Other Ambulatory Visit: Payer: Self-pay

## 2021-07-24 ENCOUNTER — Inpatient Hospital Stay: Payer: Medicare Other | Attending: Hematology

## 2021-07-24 ENCOUNTER — Encounter: Payer: Self-pay | Admitting: Hematology

## 2021-07-24 ENCOUNTER — Inpatient Hospital Stay (HOSPITAL_BASED_OUTPATIENT_CLINIC_OR_DEPARTMENT_OTHER): Payer: Medicare Other | Admitting: Hematology

## 2021-07-24 VITALS — BP 155/78 | HR 67 | Temp 98.4°F | Resp 17 | Wt 174.5 lb

## 2021-07-24 DIAGNOSIS — Z85038 Personal history of other malignant neoplasm of large intestine: Secondary | ICD-10-CM | POA: Diagnosis not present

## 2021-07-24 DIAGNOSIS — I2510 Atherosclerotic heart disease of native coronary artery without angina pectoris: Secondary | ICD-10-CM | POA: Insufficient documentation

## 2021-07-24 DIAGNOSIS — Z8616 Personal history of COVID-19: Secondary | ICD-10-CM | POA: Diagnosis not present

## 2021-07-24 DIAGNOSIS — Z9221 Personal history of antineoplastic chemotherapy: Secondary | ICD-10-CM | POA: Diagnosis not present

## 2021-07-24 DIAGNOSIS — I700 Atherosclerosis of aorta: Secondary | ICD-10-CM | POA: Insufficient documentation

## 2021-07-24 DIAGNOSIS — Z79899 Other long term (current) drug therapy: Secondary | ICD-10-CM | POA: Insufficient documentation

## 2021-07-24 DIAGNOSIS — C180 Malignant neoplasm of cecum: Secondary | ICD-10-CM

## 2021-07-24 DIAGNOSIS — G4700 Insomnia, unspecified: Secondary | ICD-10-CM | POA: Insufficient documentation

## 2021-07-24 DIAGNOSIS — Z9049 Acquired absence of other specified parts of digestive tract: Secondary | ICD-10-CM | POA: Insufficient documentation

## 2021-07-24 DIAGNOSIS — I4891 Unspecified atrial fibrillation: Secondary | ICD-10-CM | POA: Diagnosis not present

## 2021-07-24 DIAGNOSIS — E7800 Pure hypercholesterolemia, unspecified: Secondary | ICD-10-CM | POA: Insufficient documentation

## 2021-07-24 DIAGNOSIS — E119 Type 2 diabetes mellitus without complications: Secondary | ICD-10-CM | POA: Insufficient documentation

## 2021-07-24 DIAGNOSIS — Z7984 Long term (current) use of oral hypoglycemic drugs: Secondary | ICD-10-CM | POA: Diagnosis not present

## 2021-07-24 DIAGNOSIS — K769 Liver disease, unspecified: Secondary | ICD-10-CM | POA: Insufficient documentation

## 2021-07-24 LAB — CBC WITH DIFFERENTIAL (CANCER CENTER ONLY)
Abs Immature Granulocytes: 0.02 10*3/uL (ref 0.00–0.07)
Basophils Absolute: 0.1 10*3/uL (ref 0.0–0.1)
Basophils Relative: 2 %
Eosinophils Absolute: 0.1 10*3/uL (ref 0.0–0.5)
Eosinophils Relative: 3 %
HCT: 43.4 % (ref 39.0–52.0)
Hemoglobin: 14.7 g/dL (ref 13.0–17.0)
Immature Granulocytes: 0 %
Lymphocytes Relative: 17 %
Lymphs Abs: 0.9 10*3/uL (ref 0.7–4.0)
MCH: 30.6 pg (ref 26.0–34.0)
MCHC: 33.9 g/dL (ref 30.0–36.0)
MCV: 90.4 fL (ref 80.0–100.0)
Monocytes Absolute: 0.4 10*3/uL (ref 0.1–1.0)
Monocytes Relative: 8 %
Neutro Abs: 3.7 10*3/uL (ref 1.7–7.7)
Neutrophils Relative %: 70 %
Platelet Count: 252 10*3/uL (ref 150–400)
RBC: 4.8 MIL/uL (ref 4.22–5.81)
RDW: 12.3 % (ref 11.5–15.5)
WBC Count: 5.2 10*3/uL (ref 4.0–10.5)
nRBC: 0 % (ref 0.0–0.2)

## 2021-07-24 LAB — CMP (CANCER CENTER ONLY)
ALT: 20 U/L (ref 0–44)
AST: 15 U/L (ref 15–41)
Albumin: 4 g/dL (ref 3.5–5.0)
Alkaline Phosphatase: 72 U/L (ref 38–126)
Anion gap: 5 (ref 5–15)
BUN: 20 mg/dL (ref 8–23)
CO2: 29 mmol/L (ref 22–32)
Calcium: 8.9 mg/dL (ref 8.9–10.3)
Chloride: 104 mmol/L (ref 98–111)
Creatinine: 0.88 mg/dL (ref 0.61–1.24)
GFR, Estimated: >60 mL/min (ref >60)
Glucose, Bld: 254 mg/dL — ABNORMAL HIGH (ref 70–99)
Potassium: 4 mmol/L (ref 3.5–5.1)
Sodium: 138 mmol/L (ref 135–145)
Total Bilirubin: 1.9 mg/dL — ABNORMAL HIGH (ref 0.3–1.2)
Total Protein: 6.4 g/dL — ABNORMAL LOW (ref 6.5–8.1)

## 2021-07-24 LAB — CEA (IN HOUSE-CHCC): CEA (CHCC-In House): 1.49 ng/mL (ref 0.00–5.00)
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
## 2021-09-23 ENCOUNTER — Ambulatory Visit: Payer: Medicare Other | Admitting: Family Medicine

## 2021-10-02 ENCOUNTER — Encounter: Payer: Self-pay | Admitting: Family Medicine

## 2021-10-02 ENCOUNTER — Ambulatory Visit (INDEPENDENT_AMBULATORY_CARE_PROVIDER_SITE_OTHER): Payer: Medicare Other | Admitting: Family Medicine

## 2021-10-02 VITALS — BP 126/74 | HR 65 | Temp 98.0°F | Resp 17 | Ht 66.0 in | Wt 178.8 lb

## 2021-10-02 DIAGNOSIS — G4700 Insomnia, unspecified: Secondary | ICD-10-CM

## 2021-10-02 DIAGNOSIS — Z794 Long term (current) use of insulin: Secondary | ICD-10-CM

## 2021-10-02 DIAGNOSIS — E785 Hyperlipidemia, unspecified: Secondary | ICD-10-CM

## 2021-10-02 DIAGNOSIS — E1165 Type 2 diabetes mellitus with hyperglycemia: Secondary | ICD-10-CM

## 2021-10-02 DIAGNOSIS — I700 Atherosclerosis of aorta: Secondary | ICD-10-CM

## 2021-10-02 LAB — POCT GLYCOSYLATED HEMOGLOBIN (HGB A1C): Hemoglobin A1C: 8.9 % — AB (ref 4.0–5.6)

## 2021-10-02 MED ORDER — EMPAGLIFLOZIN 10 MG PO TABS: 10.0000 mg | ORAL_TABLET | Freq: Every day | ORAL | 2 refills | Status: DC
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
## 2021-10-02 NOTE — Patient Instructions (Addendum)
Ok to take 1, 1 and 1/2, or 2 trazodone at night for sleep if needed. Start [place]. Watch for the side effects we discussed and let me know if they occur. No other med changes today. If that medicine is too costly let me know - we have alternatives.  ?Recheck in 6 weeks with a test called fructosamine to decide on other changes.  ?See diet info below for diabetes.  ?Keep up the good work with walking.  ? ?Diabetes Mellitus and Nutrition, Adult ?When you have diabetes, or diabetes mellitus, it is very important to have healthy eating habits because your blood sugar (glucose) levels are greatly affected by what you eat and drink. Eating healthy foods in the right amounts, at about the same times every day, can help you: ?Manage your blood glucose. ?Lower your risk of heart disease. ?Improve your blood pressure. ?Reach or maintain a healthy weight. ?What can affect my meal plan? ?Every person with diabetes is different, and each person has different needs for a meal plan. Your health care provider may recommend that you work with a dietitian to make a meal plan that is best for you. Your meal plan may vary depending on factors such as: ?The calories you need. ?The medicines you take. ?Your weight. ?Your blood glucose, blood pressure, and cholesterol levels. ?Your activity level. ?Other health conditions you have, such as heart or kidney disease. ?How do carbohydrates affect me? ?Carbohydrates, also called carbs, affect your blood glucose level more than any other type of food. Eating carbs raises the amount of glucose in your blood. ?It is important to know how many carbs you can safely have in each meal. This is different for every person. Your dietitian can help you calculate how many carbs you should have at each meal and for each snack. ?How does alcohol affect me? ?Alcohol can cause a decrease in blood glucose (hypoglycemia), especially if you use insulin or take certain diabetes medicines by mouth.  Hypoglycemia can be a life-threatening condition. Symptoms of hypoglycemia, such as sleepiness, dizziness, and confusion, are similar to symptoms of having too much alcohol. ?Do not drink alcohol if: ?Your health care provider tells you not to drink. ?You are pregnant, may be pregnant, or are planning to become pregnant. ?If you drink alcohol: ?Limit how much you have to: ?0-1 drink a day for women. ?0-2 drinks a day for men. ?Know how much alcohol is in your drink. In the [place] one drink equals one 12 oz bottle of beer (355 mL), one 5 oz glass of wine (148 mL), or one 1? oz glass of hard liquor (44 mL). ?Keep yourself hydrated with water, diet soda, or unsweetened iced tea. Keep in mind that regular soda, juice, and other mixers may contain a lot of sugar and must be counted as carbs. ?What are tips for following this plan? ?Reading food labels ?Start by checking the serving size on the Nutrition Facts label of packaged foods and drinks. The number of calories and the amount of carbs, fats, and other nutrients listed on the label are based on one serving of the item. Many items contain more than one serving per package. ?Check the total grams (g) of carbs in one serving. ?Check the number of grams of saturated fats and trans fats in one serving. Choose foods that have a low amount or none of these fats. ?Check the number of milligrams (mg) of salt (sodium) in one serving. Most people should limit total sodium intake to  less than 2,300 mg per day. ?Always check the nutrition information of foods labeled as "low-fat" or "nonfat." These foods may be higher in added sugar or refined carbs and should be avoided. ?Talk to your dietitian to identify your daily goals for nutrients listed on the label. ?Shopping ?Avoid buying canned, pre-made, or processed foods. These foods tend to be high in fat, sodium, and added sugar. ?Shop around the outside edge of the grocery store. This is where you will most often find fresh  fruits and vegetables, bulk grains, fresh meats, and fresh dairy products. ?Cooking ?Use low-heat cooking methods, such as baking, instead of high-heat cooking methods, such as deep frying. ?Cook using healthy oils, such as olive, canola, or sunflower oil. ?Avoid cooking with butter, cream, or high-fat meats. ?Meal planning ?Eat meals and snacks regularly, preferably at the same times every day. Avoid going long periods of time without eating. ?Eat foods that are high in fiber, such as fresh fruits, vegetables, beans, and whole grains. ?Eat 4-6 oz (112-168 g) of lean protein each day, such as lean meat, chicken, fish, eggs, or tofu. One ounce (oz) (28 g) of lean protein is equal to: ?1 oz (28 g) of meat, chicken, or fish. ?1 egg. ?? cup (62 g) of tofu. ?Eat some foods each day that contain healthy fats, such as avocado, nuts, seeds, and fish. ?What foods should I eat? ?Fruits ?Berries. Apples. Oranges. Peaches. Apricots. Plums. Grapes. Mangoes. Papayas. Pomegranates. Kiwi. Cherries. ?Vegetables ?Leafy greens, including lettuce, spinach, kale, chard, collard greens, mustard greens, and cabbage. Beets. Cauliflower. Broccoli. Carrots. Green beans. Tomatoes. Peppers. Onions. Cucumbers. Brussels sprouts. ?Grains ?Whole grains, such as whole-wheat or whole-grain bread, crackers, tortillas, cereal, and pasta. Unsweetened oatmeal. Quinoa. Brown or wild rice. ?Meats and other proteins ?Seafood. Poultry without skin. Lean cuts of poultry and beef. Tofu. Nuts. Seeds. ?Dairy ?Low-fat or fat-free dairy products such as milk, yogurt, and cheese. ?The items listed above may not be a complete list of foods and beverages you can eat and drink. Contact a dietitian for more information. ?What foods should I avoid? ?Fruits ?Fruits canned with syrup. ?Vegetables ?Canned vegetables. Frozen vegetables with butter or cream sauce. ?Grains ?Refined white flour and flour products such as bread, pasta, snack foods, and cereals. Avoid all  processed foods. ?Meats and other proteins ?Fatty cuts of meat. Poultry with skin. Breaded or fried meats. Processed meat. Avoid saturated fats. ?Dairy ?Full-fat yogurt, cheese, or milk. ?Beverages ?Sweetened drinks, such as soda or iced tea. ?The items listed above may not be a complete list of foods and beverages you should avoid. Contact a dietitian for more information. ?Questions to ask a health care provider ?Do I need to meet with a certified diabetes care and education specialist? ?Do I need to meet with a dietitian? ?What number can I call if I have questions? ?When are the best times to check my blood glucose? ?Where to find more information: ?American Diabetes Association: diabetes.org ?Academy of Nutrition and Dietetics: eatright.org ?[employer] of Diabetes and Digestive and Kidney Diseases: AmenCredit.is ?Association of Diabetes Care & Education Specialists: diabeteseducator.org ?Summary ?It is important to have healthy eating habits because your blood sugar (glucose) levels are greatly affected by what you eat and drink. It is important to use alcohol carefully. ?A healthy meal plan will help you manage your blood glucose and lower your risk of heart disease. ?Your health care provider may recommend that you work with a dietitian to make a meal plan that is  best for you. ?This information is not intended to replace advice given to you by your health care provider. Make sure you discuss any questions you have with your health care provider. ?Document Revised: 02/22/2020 Document Reviewed: 02/22/2020 ?Elsevier Patient Education ? 2022 [place]. ? ? ? ? ?

## 2021-10-03 LAB — MICROALBUMIN / CREATININE URINE RATIO
Creatinine,U: 48.2 mg/dL
Microalb Creat Ratio: 2.2 mg/g (ref 0.0–30.0)
Microalb, Ur: 1.1 mg/dL (ref 0.0–1.9)

## 2021-10-29 ENCOUNTER — Encounter: Payer: Self-pay | Admitting: Family Medicine

## 2021-11-14 ENCOUNTER — Encounter: Payer: Self-pay | Admitting: Family Medicine

## 2021-11-14 ENCOUNTER — Ambulatory Visit (INDEPENDENT_AMBULATORY_CARE_PROVIDER_SITE_OTHER): Payer: Medicare Other | Admitting: Family Medicine

## 2021-11-14 VITALS — BP 122/62 | HR 80 | Temp 98.3°F | Resp 18 | Ht 66.0 in | Wt 172.6 lb

## 2021-11-14 DIAGNOSIS — E1129 Type 2 diabetes mellitus with other diabetic kidney complication: Secondary | ICD-10-CM

## 2021-11-14 DIAGNOSIS — E1165 Type 2 diabetes mellitus with hyperglycemia: Secondary | ICD-10-CM

## 2021-11-14 DIAGNOSIS — Z794 Long term (current) use of insulin: Secondary | ICD-10-CM

## 2021-11-14 DIAGNOSIS — E785 Hyperlipidemia, unspecified: Secondary | ICD-10-CM | POA: Diagnosis not present

## 2021-11-14 DIAGNOSIS — R809 Proteinuria, unspecified: Secondary | ICD-10-CM

## 2021-11-14 MED ORDER — EMPAGLIFLOZIN 25 MG PO TABS: 25.0000 mg | ORAL_TABLET | Freq: Every day | ORAL | 1 refills | Status: DC
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
## 2021-11-14 NOTE — Patient Instructions (Addendum)
?  Since William Oneill is working well, we can try a higher dose. Start '25mg'$ , stop '10mg'$  dose for now and recheck in 2 months for labs.  ? ?Return to the clinic or go to the nearest emergency room if any of your symptoms worsen or new symptoms occur. ? ? ? ?If you have lab work done today you will be contacted with your lab results within the next 2 weeks.  If you have not heard from [place] then please contact us. The fastest way to get your results is to register for My Chart. ? ? ?IF you received an x-ray today, you will receive an invoice from [facility]. Please contact [facility] at [phone] with questions or concerns regarding your invoice.  ? ?IF you received labwork today, you will receive an invoice from [facility]. Please contact [facility] at [phone] with questions or concerns regarding your invoice.  ? ?Our billing staff will not be able to assist you with questions regarding bills from these companies. ? ?You will be contacted with the lab results as soon as they are available. The fastest way to get your results is to activate your My Chart account. Instructions are located on the last page of this paperwork. If you have not heard from [place] regarding the results in 2 weeks, please contact this office. ?  ? ? ?

## 2021-12-01 IMAGING — US US ABDOMEN LIMITED
1 series · 14 of 25 positions shown · non-contrast
Comparison: 01/18/2021

CLINICAL DATA: Right upper quadrant pain with chest pain for 1 day,
initial encounter

EXAM:
ULTRASOUND ABDOMEN LIMITED RIGHT UPPER QUADRANT

[Series 1: us abdomen limited ruq (liver/gb) · 14 of 111 slices shown]
[im 1/111]
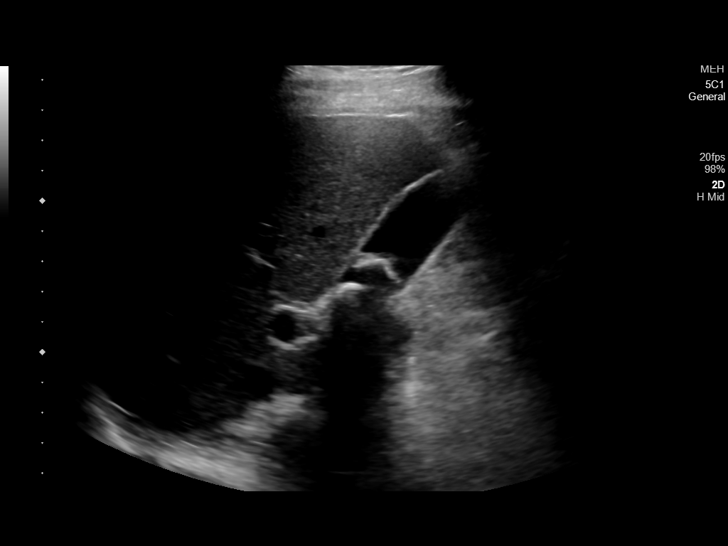
[im 10/111]
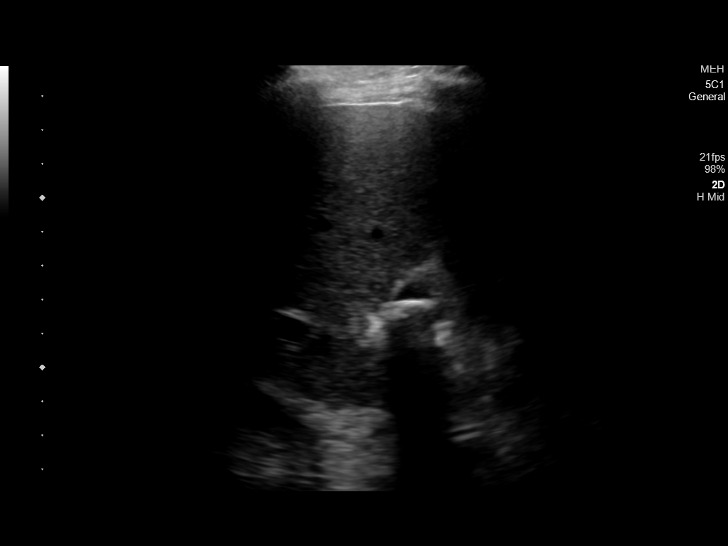
[im 19/111]
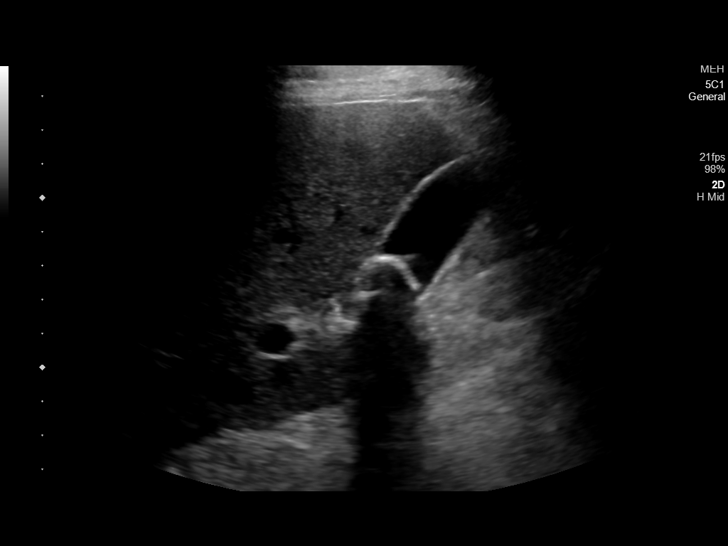
[im 28/111]
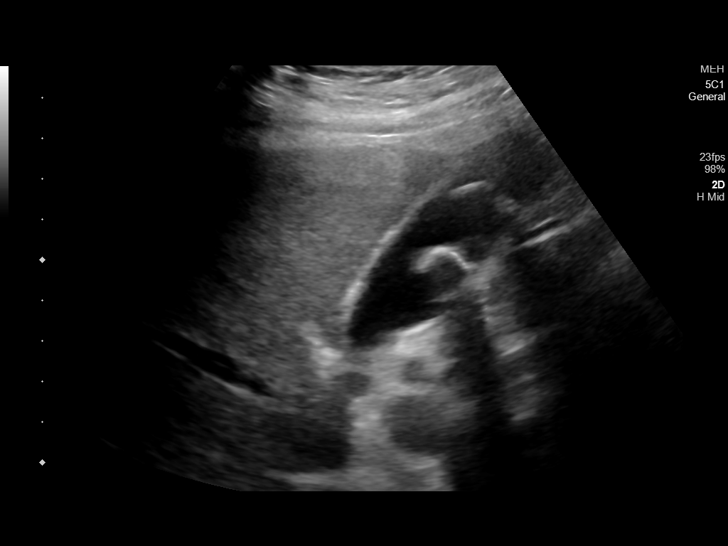
[im 37/111]
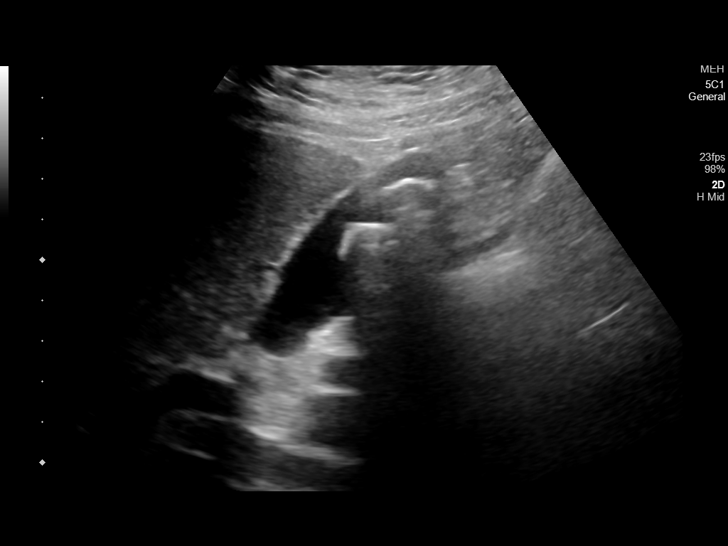
[im 42/111]
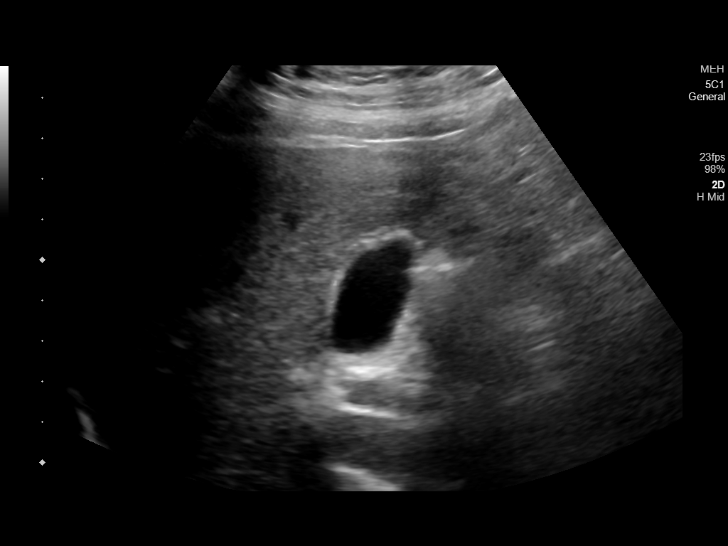
[im 51/111]
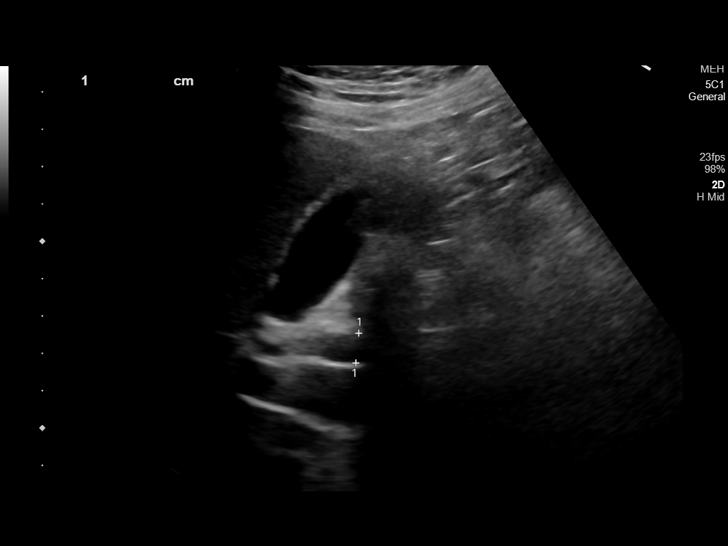
[im 60/111]
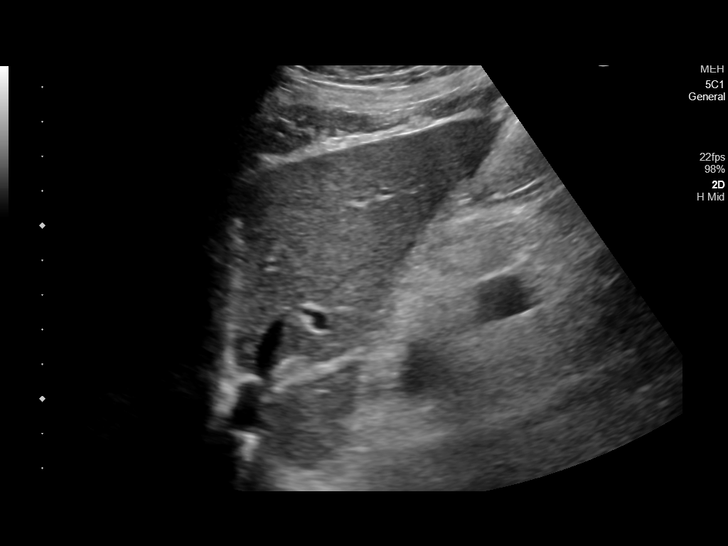
[im 69/111]
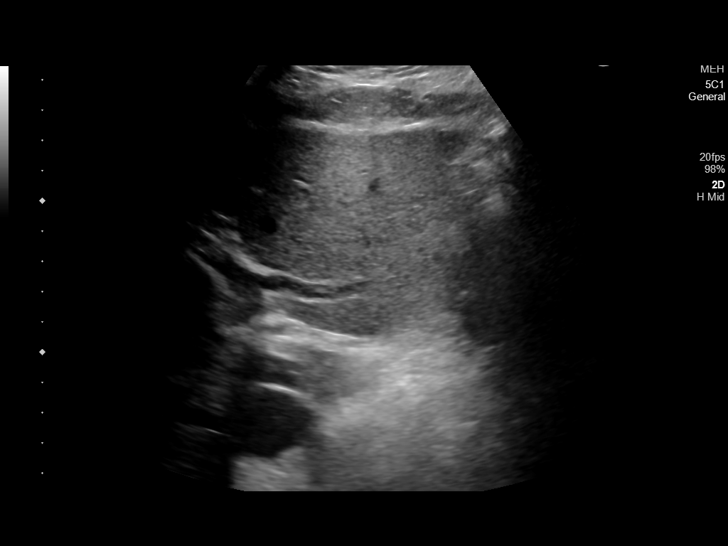
[im 74/111]
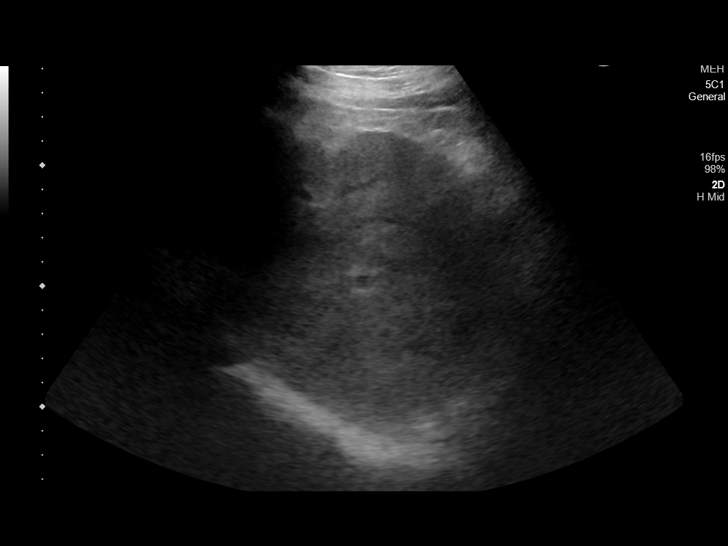
[im 83/111]
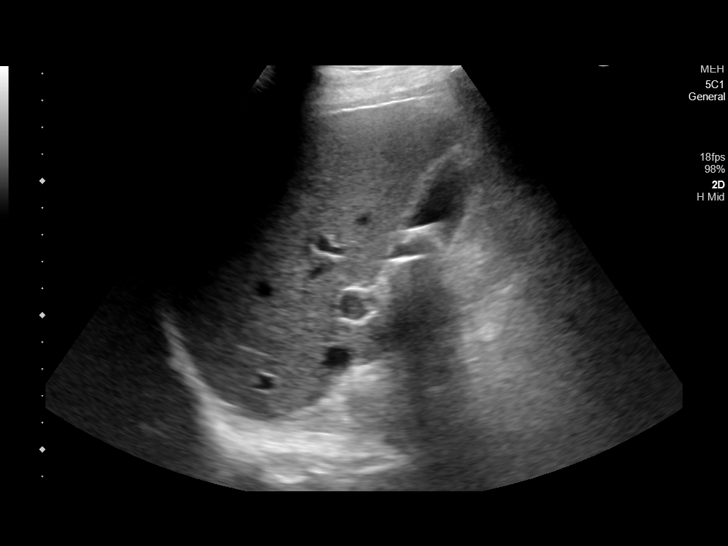
[im 92/111]
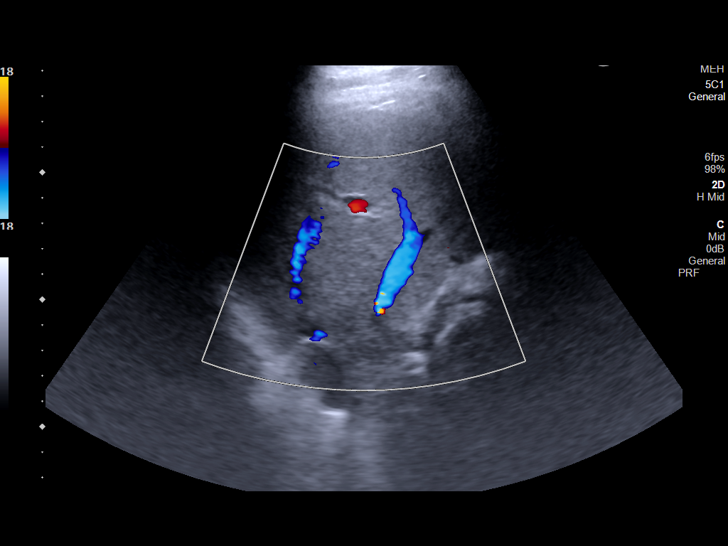
[im 101/111]
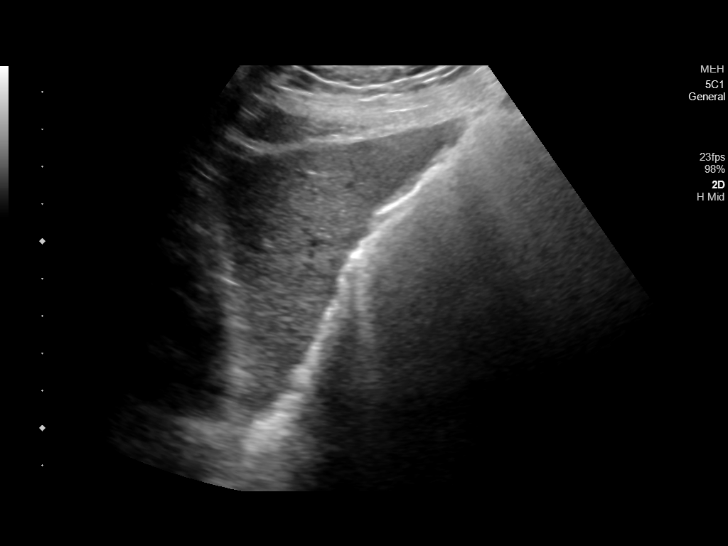
[im 111/111]
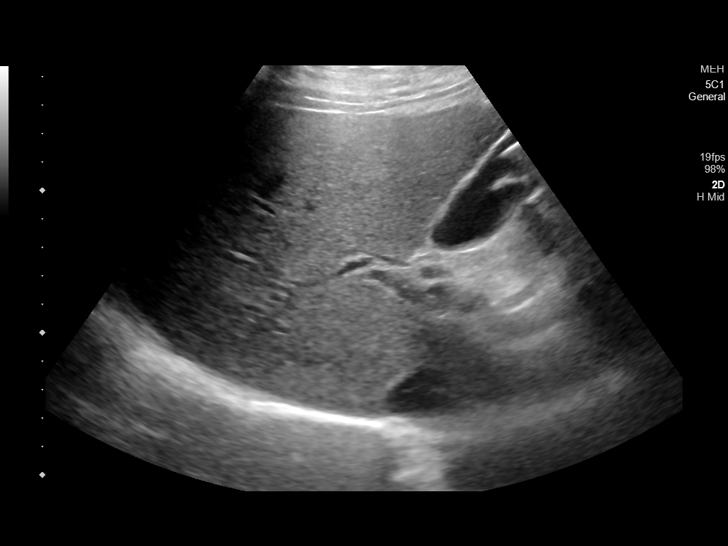

[14 of 25 positions shown; findings below may reference images not displayed]

FINDINGS: Gallbladder:

Gallbladder is well distended with multiple mobile gallstones
within. No wall thickening or pericholecystic fluid is noted.
Negative sonographic Murphy's sign is elicited.

Common bile duct:

Diameter: 8 mm. This is likely within normal limits for the
patient's given age. No intrahepatic ductal dilatation is seen.

Liver:

No focal lesion identified. Within normal limits in parenchymal
echogenicity. Portal vein is patent on color Doppler imaging with
normal direction of blood flow towards the liver.

Other: None.
IMPRESSION: Cholelithiasis without complicating factors.

## 2022-01-16 ENCOUNTER — Ambulatory Visit (INDEPENDENT_AMBULATORY_CARE_PROVIDER_SITE_OTHER): Payer: Medicare Other | Admitting: Family Medicine

## 2022-01-16 VITALS — BP 128/74 | HR 65 | Temp 98.1°F | Resp 16 | Ht 66.0 in | Wt 173.8 lb

## 2022-01-16 DIAGNOSIS — Z794 Long term (current) use of insulin: Secondary | ICD-10-CM | POA: Diagnosis not present

## 2022-01-16 DIAGNOSIS — G4700 Insomnia, unspecified: Secondary | ICD-10-CM

## 2022-01-16 DIAGNOSIS — E1165 Type 2 diabetes mellitus with hyperglycemia: Secondary | ICD-10-CM

## 2022-01-16 DIAGNOSIS — E785 Hyperlipidemia, unspecified: Secondary | ICD-10-CM | POA: Diagnosis not present

## 2022-01-16 LAB — COMPREHENSIVE METABOLIC PANEL
ALT: 26 U/L (ref 0–53)
AST: 25 U/L (ref 0–37)
Albumin: 4.4 g/dL (ref 3.5–5.2)
Alkaline Phosphatase: 76 U/L (ref 39–117)
BUN: 25 mg/dL — ABNORMAL HIGH (ref 6–23)
CO2: 27 mEq/L (ref 19–32)
Calcium: 9.1 mg/dL (ref 8.4–10.5)
Chloride: 104 mEq/L (ref 96–112)
Creatinine, Ser: 0.86 mg/dL (ref 0.40–1.50)
GFR: 85.16 mL/min (ref >60.00)
Glucose, Bld: 89 mg/dL (ref 70–99)
Potassium: 4.1 mEq/L (ref 3.5–5.1)
Sodium: 137 mEq/L (ref 135–145)
Total Bilirubin: 2 mg/dL — ABNORMAL HIGH (ref 0.2–1.2)
Total Protein: 7.1 g/dL (ref 6.0–8.3)

## 2022-01-16 LAB — LIPID PANEL
Cholesterol: 183 mg/dL (ref 0–200)
HDL: 65.2 mg/dL (ref >39.00)
LDL Cholesterol: 98 mg/dL (ref 0–99)
NonHDL: 117.58
Total CHOL/HDL Ratio: 3
Triglycerides: 97 mg/dL (ref 0.0–149.0)
VLDL: 19.4 mg/dL (ref 0.0–40.0)

## 2022-01-16 LAB — HEMOGLOBIN A1C: Hgb A1c MFr Bld: 6.9 % — ABNORMAL HIGH (ref 4.6–6.5)

## 2022-01-16 MED ORDER — ATORVASTATIN CALCIUM 10 MG PO TABS: 10.0000 mg | ORAL_TABLET | Freq: Every day | ORAL | 1 refills | Status: DC

## 2022-01-16 MED ORDER — TRAZODONE HCL 100 MG PO TABS: 100.0000 mg | ORAL_TABLET | Freq: Every day | ORAL | 1 refills | Status: DC

## 2022-01-16 MED ORDER — METFORMIN HCL 1000 MG PO TABS: 1000.0000 mg | ORAL_TABLET | Freq: Two times a day (BID) | ORAL | 1 refills | Status: DC

## 2022-01-16 MED ORDER — SITAGLIPTIN PHOSPHATE 100 MG PO TABS: 100.0000 mg | ORAL_TABLET | Freq: Every day | ORAL | 1 refills | Status: DC
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
## 2022-01-20 ENCOUNTER — Telehealth: Payer: Self-pay | Admitting: Hematology

## 2022-01-20 NOTE — Telephone Encounter (Signed)
Rescheduled upcoming appointment per provider's request. Patient is aware of changes. 

## 2022-01-22 ENCOUNTER — Inpatient Hospital Stay: Payer: Medicare Other

## 2022-01-22 ENCOUNTER — Inpatient Hospital Stay: Payer: Medicare Other | Admitting: Hematology

## 2022-02-07 ENCOUNTER — Inpatient Hospital Stay (HOSPITAL_BASED_OUTPATIENT_CLINIC_OR_DEPARTMENT_OTHER): Payer: Medicare Other | Admitting: Hematology

## 2022-02-07 ENCOUNTER — Inpatient Hospital Stay: Payer: Medicare Other | Attending: Hematology

## 2022-02-07 ENCOUNTER — Other Ambulatory Visit: Payer: Self-pay

## 2022-02-07 ENCOUNTER — Encounter: Payer: Self-pay | Admitting: Hematology

## 2022-02-07 VITALS — BP 143/58 | HR 63 | Temp 97.9°F | Resp 18 | Ht 66.0 in | Wt 169.6 lb

## 2022-02-07 DIAGNOSIS — C180 Malignant neoplasm of cecum: Secondary | ICD-10-CM

## 2022-02-07 DIAGNOSIS — Z79899 Other long term (current) drug therapy: Secondary | ICD-10-CM | POA: Diagnosis not present

## 2022-02-07 DIAGNOSIS — E119 Type 2 diabetes mellitus without complications: Secondary | ICD-10-CM | POA: Insufficient documentation

## 2022-02-07 DIAGNOSIS — Z8616 Personal history of COVID-19: Secondary | ICD-10-CM | POA: Diagnosis not present

## 2022-02-07 LAB — CBC WITH DIFFERENTIAL (CANCER CENTER ONLY)
Abs Immature Granulocytes: 0.02 10*3/uL (ref 0.00–0.07)
Basophils Absolute: 0.1 10*3/uL (ref 0.0–0.1)
Basophils Relative: 1 %
Eosinophils Absolute: 0.2 10*3/uL (ref 0.0–0.5)
Eosinophils Relative: 3 %
HCT: 46.6 % (ref 39.0–52.0)
Hemoglobin: 16.1 g/dL (ref 13.0–17.0)
Immature Granulocytes: 0 %
Lymphocytes Relative: 16 %
Lymphs Abs: 0.9 10*3/uL (ref 0.7–4.0)
MCH: 31.5 pg (ref 26.0–34.0)
MCHC: 34.5 g/dL (ref 30.0–36.0)
MCV: 91.2 fL (ref 80.0–100.0)
Monocytes Absolute: 0.5 10*3/uL (ref 0.1–1.0)
Monocytes Relative: 9 %
Neutro Abs: 4.1 10*3/uL (ref 1.7–7.7)
Neutrophils Relative %: 71 %
Platelet Count: 207 10*3/uL (ref 150–400)
RBC: 5.11 MIL/uL (ref 4.22–5.81)
RDW: 12.4 % (ref 11.5–15.5)
WBC Count: 5.8 10*3/uL (ref 4.0–10.5)
nRBC: 0 % (ref 0.0–0.2)

## 2022-02-07 LAB — CMP (CANCER CENTER ONLY)
ALT: 16 U/L (ref 0–44)
AST: 18 U/L (ref 15–41)
Albumin: 4.3 g/dL (ref 3.5–5.0)
Alkaline Phosphatase: 69 U/L (ref 38–126)
Anion gap: 5 (ref 5–15)
BUN: 23 mg/dL (ref 8–23)
CO2: 28 mmol/L (ref 22–32)
Calcium: 9.1 mg/dL (ref 8.9–10.3)
Chloride: 109 mmol/L (ref 98–111)
Creatinine: 0.95 mg/dL (ref 0.61–1.24)
GFR, Estimated: >60 mL/min (ref >60)
Glucose, Bld: 201 mg/dL — ABNORMAL HIGH (ref 70–99)
Potassium: 4.2 mmol/L (ref 3.5–5.1)
Sodium: 142 mmol/L (ref 135–145)
Total Bilirubin: 1.7 mg/dL — ABNORMAL HIGH (ref 0.3–1.2)
Total Protein: 6.9 g/dL (ref 6.5–8.1)

## 2022-02-07 LAB — CEA (IN HOUSE-CHCC): CEA (CHCC-In House): 1.3 ng/mL (ref 0.00–5.00)
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
## 2022-02-10 ENCOUNTER — Telehealth: Payer: Self-pay

## 2022-02-10 NOTE — Telephone Encounter (Signed)
This nurse spoke with patient and made aware of lab results and recommendations per provider.  Patient acknowledged understanding and has no further questions or concerns at this time.

## 2022-02-10 NOTE — Telephone Encounter (Signed)
-----   Message from [name], NP sent at 02/09/2022  2:24 PM EDT ----- Please let pt know CEA from last week is normal, no concerns, and no change to the plan per Dr. Burr Medico. He does not use MyChart. Thanks, Regan Rakers, NP

## 2022-02-21 ENCOUNTER — Telehealth: Payer: Self-pay | Admitting: Family Medicine

## 2022-02-21 NOTE — Telephone Encounter (Signed)
Left message for patient to call back and schedule Medicare Annual Wellness Visit (AWV) .   Please offer to do virtually or by telephone.  Left office number and my jabber #[phone].  Last AWV:06/08/2019   Please schedule at anytime with Nurse Health Advisor.  

## 2022-02-24 ENCOUNTER — Other Ambulatory Visit: Payer: Self-pay

## 2022-03-04 ENCOUNTER — Other Ambulatory Visit: Payer: Self-pay

## 2022-03-20 ENCOUNTER — Telehealth: Payer: Self-pay | Admitting: Family Medicine

## 2022-03-20 NOTE — Telephone Encounter (Signed)
Left message for patient to call back and schedule Medicare Annual Wellness Visit (AWV) .   Please offer to do virtually or by telephone.  Left office number and my jabber #[phone].  Last AWV:06/08/2019   Please schedule at anytime with Nurse Health Advisor.  

## 2022-04-04 ENCOUNTER — Other Ambulatory Visit: Payer: Self-pay

## 2022-04-18 ENCOUNTER — Encounter: Payer: Medicare Other | Admitting: Family Medicine

## 2022-04-18 DIAGNOSIS — E782 Mixed hyperlipidemia: Secondary | ICD-10-CM

## 2022-04-18 DIAGNOSIS — Z794 Long term (current) use of insulin: Secondary | ICD-10-CM

## 2022-04-21 ENCOUNTER — Ambulatory Visit: Payer: Medicare Other | Admitting: Family Medicine

## 2022-04-24 ENCOUNTER — Ambulatory Visit: Payer: Medicare Other | Admitting: Family Medicine

## 2022-04-30 LAB — HM DIABETES EYE EXAM

## 2022-05-01 ENCOUNTER — Encounter: Payer: Self-pay | Admitting: Family Medicine

## 2022-05-01 ENCOUNTER — Ambulatory Visit (INDEPENDENT_AMBULATORY_CARE_PROVIDER_SITE_OTHER): Payer: Medicare Other | Admitting: Family Medicine

## 2022-05-01 VITALS — BP 138/78 | HR 59 | Temp 98.1°F | Ht 66.0 in | Wt 173.6 lb

## 2022-05-01 DIAGNOSIS — G4700 Insomnia, unspecified: Secondary | ICD-10-CM

## 2022-05-01 DIAGNOSIS — E119 Type 2 diabetes mellitus without complications: Secondary | ICD-10-CM | POA: Diagnosis not present

## 2022-05-01 DIAGNOSIS — Z794 Long term (current) use of insulin: Secondary | ICD-10-CM

## 2022-05-01 DIAGNOSIS — E1165 Type 2 diabetes mellitus with hyperglycemia: Secondary | ICD-10-CM | POA: Diagnosis not present

## 2022-05-01 LAB — BASIC METABOLIC PANEL
BUN: 17 mg/dL (ref 6–23)
CO2: 30 mEq/L (ref 19–32)
Calcium: 8.9 mg/dL (ref 8.4–10.5)
Chloride: 103 mEq/L (ref 96–112)
Creatinine, Ser: 0.81 mg/dL (ref 0.40–1.50)
GFR: 86.54 mL/min (ref >60.00)
Glucose, Bld: 154 mg/dL — ABNORMAL HIGH (ref 70–99)
Potassium: 4.3 mEq/L (ref 3.5–5.1)
Sodium: 138 mEq/L (ref 135–145)

## 2022-05-01 LAB — HEMOGLOBIN A1C: Hgb A1c MFr Bld: 7.5 % — ABNORMAL HIGH (ref 4.6–6.5)

## 2022-05-01 MED ORDER — EMPAGLIFLOZIN 25 MG PO TABS: 25.0000 mg | ORAL_TABLET | Freq: Every day | ORAL | 1 refills | Status: DC

## 2022-05-01 MED ORDER — METFORMIN HCL 1000 MG PO TABS: 1000.0000 mg | ORAL_TABLET | Freq: Two times a day (BID) | ORAL | 1 refills | Status: DC

## 2022-05-01 MED ORDER — ATORVASTATIN CALCIUM 10 MG PO TABS: 10.0000 mg | ORAL_TABLET | Freq: Every day | ORAL | 1 refills | Status: DC

## 2022-05-01 MED ORDER — SITAGLIPTIN PHOSPHATE 100 MG PO TABS: 100.0000 mg | ORAL_TABLET | Freq: Every day | ORAL | 1 refills | Status: DC

## 2022-05-01 MED ORDER — TRAZODONE HCL 100 MG PO TABS: 100.0000 mg | ORAL_TABLET | Freq: Every day | ORAL | 1 refills | Status: DC
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
## 2022-05-09 ENCOUNTER — Other Ambulatory Visit: Payer: Self-pay

## 2022-05-09 DIAGNOSIS — E119 Type 2 diabetes mellitus without complications: Secondary | ICD-10-CM

## 2022-05-09 MED ORDER — ONETOUCH ULTRA VI STRP: ORAL_STRIP | 6 refills | Status: AC

## 2022-05-09 NOTE — Progress Notes (Signed)
Spoke to the pt sister [name] ( she is on HIPAA ) and advised of lab results . Pt has an apt in Jan 2024 will recheck labs then

## 2022-05-23 ENCOUNTER — Telehealth: Payer: Self-pay | Admitting: Family Medicine

## 2022-05-23 NOTE — Telephone Encounter (Signed)
error 

## 2022-05-23 NOTE — Telephone Encounter (Signed)
Left message for patient to call back and schedule Medicare Annual Wellness Visit (AWV) .   Please offer to do virtually or by telephone.  Left office number and my jabber #[phone].  Last AWV:06/08/2019   Please schedule at anytime with Nurse Health Advisor.  

## 2022-06-05 ENCOUNTER — Ambulatory Visit: Payer: Medicare Other

## 2022-06-05 NOTE — Progress Notes (Signed)
Patient called states he completed with [employer]

## 2022-06-09 ENCOUNTER — Other Ambulatory Visit: Payer: Self-pay | Admitting: Hematology

## 2022-08-06 ENCOUNTER — Ambulatory Visit (INDEPENDENT_AMBULATORY_CARE_PROVIDER_SITE_OTHER): Payer: Medicare Other | Admitting: Family Medicine

## 2022-08-06 ENCOUNTER — Encounter: Payer: Self-pay | Admitting: Family Medicine

## 2022-08-06 VITALS — BP 138/76 | HR 57 | Temp 97.6°F | Ht 66.0 in | Wt 175.4 lb

## 2022-08-06 DIAGNOSIS — E1165 Type 2 diabetes mellitus with hyperglycemia: Secondary | ICD-10-CM | POA: Diagnosis not present

## 2022-08-06 DIAGNOSIS — Z794 Long term (current) use of insulin: Secondary | ICD-10-CM

## 2022-08-06 DIAGNOSIS — G4700 Insomnia, unspecified: Secondary | ICD-10-CM | POA: Diagnosis not present

## 2022-08-06 DIAGNOSIS — E785 Hyperlipidemia, unspecified: Secondary | ICD-10-CM

## 2022-08-06 LAB — COMPREHENSIVE METABOLIC PANEL
ALT: 18 U/L (ref 0–53)
AST: 19 U/L (ref 0–37)
Albumin: 4.3 g/dL (ref 3.5–5.2)
Alkaline Phosphatase: 102 U/L (ref 39–117)
BUN: 23 mg/dL (ref 6–23)
CO2: 28 mEq/L (ref 19–32)
Calcium: 9.1 mg/dL (ref 8.4–10.5)
Chloride: 103 mEq/L (ref 96–112)
Creatinine, Ser: 0.86 mg/dL (ref 0.40–1.50)
GFR: 84.83 mL/min (ref >60.00)
Glucose, Bld: 175 mg/dL — ABNORMAL HIGH (ref 70–99)
Potassium: 4.9 mEq/L (ref 3.5–5.1)
Sodium: 138 mEq/L (ref 135–145)
Total Bilirubin: 1.3 mg/dL — ABNORMAL HIGH (ref 0.2–1.2)
Total Protein: 6.9 g/dL (ref 6.0–8.3)

## 2022-08-06 LAB — LIPID PANEL
Cholesterol: 152 mg/dL (ref 0–200)
HDL: 53.8 mg/dL (ref >39.00)
LDL Cholesterol: 84 mg/dL (ref 0–99)
NonHDL: 98.17
Total CHOL/HDL Ratio: 3
Triglycerides: 72 mg/dL (ref 0.0–149.0)
VLDL: 14.4 mg/dL (ref 0.0–40.0)

## 2022-08-06 LAB — HEMOGLOBIN A1C: Hgb A1c MFr Bld: 7.3 % — ABNORMAL HIGH (ref 4.6–6.5)

## 2022-08-06 MED ORDER — SITAGLIPTIN PHOSPHATE 100 MG PO TABS: 100.0000 mg | ORAL_TABLET | Freq: Every day | ORAL | 1 refills | Status: DC

## 2022-08-06 MED ORDER — ATORVASTATIN CALCIUM 10 MG PO TABS: 10.0000 mg | ORAL_TABLET | Freq: Every day | ORAL | 1 refills | Status: DC

## 2022-08-06 MED ORDER — TRAZODONE HCL 100 MG PO TABS: 100.0000 mg | ORAL_TABLET | Freq: Every day | ORAL | 1 refills | Status: DC

## 2022-08-06 MED ORDER — METFORMIN HCL 1000 MG PO TABS: 1000.0000 mg | ORAL_TABLET | Freq: Two times a day (BID) | ORAL | 1 refills | Status: DC

## 2022-08-06 MED ORDER — EMPAGLIFLOZIN 25 MG PO TABS: 25.0000 mg | ORAL_TABLET | Freq: Every day | ORAL | 1 refills | Status: DC
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
## 2022-08-06 NOTE — Patient Instructions (Signed)
Thanks for coming in today.  No medication changes at this time.  Depending on blood sugar level we can look at changing medicines but I will let you know.  Take care!

## 2022-09-04 NOTE — Progress Notes (Signed)
This encounter was created in error - please disregard.

## 2022-11-05 ENCOUNTER — Ambulatory Visit: Payer: Medicare Other | Admitting: Family Medicine

## 2022-11-07 ENCOUNTER — Telehealth: Payer: Self-pay | Admitting: Family Medicine

## 2022-11-07 NOTE — Telephone Encounter (Signed)
Contacted Mortez Meadowcroft to schedule their annual wellness visit. Appointment made for 11/12/2022.  Thank you,  [facility] Support [facility] Direct dial  [phone]

## 2022-11-11 ENCOUNTER — Telehealth: Payer: Self-pay | Admitting: Family Medicine

## 2022-11-11 NOTE — Telephone Encounter (Signed)
I left a message on patient's voice mail to confirm AW[facility] phone call on 11/12/2022 at 3:00.

## 2022-11-18 ENCOUNTER — Telehealth: Payer: Self-pay | Admitting: Pharmacist

## 2022-11-18 NOTE — Telephone Encounter (Signed)
Hobart Sieg is a 77 y.o. year old male who presented for a telephone visit.   They were referred to the pharmacist by a quality report for assistance in managing complex medication management. Patient was noted to have failed MAD (medication adherence for diabetes meds) and SUPD (statin use in diabetics) Medicare metrics for 2023. However according to his pharmacy [facility] patient filled atorvastatin 07/16/2022 and 07/29/2022 which should have fulfilled SUPD measure.   Primary Care Provider: Shade Flood, MD ; Next Scheduled Visit: 11/24/2022  Medication Access/Adherence  Patient reports affordability concerns with their medications: No  - patient report highest copays are $35 for Januvia and Jardiance Patient reports access/transportation concerns to their pharmacy: No  Patient reports adherence concerns with their medications:  No  - however patient has not filled atorvastatin since 07/17/2023 for 90 days (picked up 12/26/203) and metformin last filled in 2022.  Both of these prescription were updated by Dr Neva Seat 08/06/2022 so patient has available prescriptions.    Objective:  Lab Results  Component Value Date   HGBA1C 7.3 (H) 08/06/2022    Lab Results  Component Value Date   CREATININE 0.86 08/06/2022   BUN 23 08/06/2022   NA 138 08/06/2022   K 4.9 08/06/2022   CL 103 08/06/2022   CO2 28 08/06/2022    Lab Results  Component Value Date   CHOL 152 08/06/2022   HDL 53.80 08/06/2022   LDLCALC 84 08/06/2022   TRIG 72.0 08/06/2022   CHOLHDL 3 08/06/2022     Assessment/Plan:   Medication Adherence Has recently filled Jardiance and Januvia. Screened for any issues with medication costs. Patient reports no issues with cost of medications.  Discussed importance of taking all medications as directed. Reviewed medication list with patient.   Patient will see Dr Neva Seat next week 11/24/2022 Consider adding metformin to either Januvia or Jandiance to impove  adherence. Recommend starting with lower dose of metformin since it seems patient has not been taking regularly to avoid GI upset.  Could try:   Synjardy XR 25mg  / 1000mg   once a day or regular release Synjardy 12.5mg  / 500mg  twice a day OR  Janumet 50/500mg  twice a day or JanumetXR 100/1000mg  daily  Will continue to follow patient for 2024 to assess and address adherence barriers.

## 2022-11-19 ENCOUNTER — Ambulatory Visit (INDEPENDENT_AMBULATORY_CARE_PROVIDER_SITE_OTHER): Payer: Medicare Other | Admitting: *Deleted

## 2022-11-19 DIAGNOSIS — Z0000 Encounter for general adult medical examination without abnormal findings: Secondary | ICD-10-CM | POA: Diagnosis not present

## 2022-11-19 NOTE — Patient Instructions (Signed)
[name] , Thank you for taking time to come for your Medicare Wellness Visit. I appreciate your ongoing commitment to your health goals. Please review the following plan we discussed and let me know if I can assist you in the future.   Screening recommendations/referrals: Colonoscopy: no longer required Recommended yearly ophthalmology/optometry visit for glaucoma screening and checkup Recommended yearly dental visit for hygiene and checkup  Vaccinations: Influenza vaccine: up to date Pneumococcal vaccine: up to date Tdap vaccine: up to date Shingles vaccine: 1 of 2    Advanced directives: Education provided    Preventive Care 65 Years and Older, Male Preventive care refers to lifestyle choices and visits with your health care provider that can promote health and wellness. What does preventive care include? A yearly physical exam. This is also called an annual well check. Dental exams once or twice a year. Routine eye exams. Ask your health care provider how often you should have your eyes checked. Personal lifestyle choices, including: Daily care of your teeth and gums. Regular physical activity. Eating a healthy diet. Avoiding tobacco and drug use. Limiting alcohol use. Practicing safe sex. Taking low doses of aspirin every day. Taking vitamin and mineral supplements as recommended by your health care provider. What happens during an annual well check? The services and screenings done by your health care provider during your annual well check will depend on your age, overall health, lifestyle risk factors, and family history of disease. Counseling  Your health care provider may ask you questions about your: Alcohol use. Tobacco use. Drug use. Emotional well-being. Home and relationship well-being. Sexual activity. Eating habits. History of falls. Memory and ability to understand (cognition). Work and work Astronomer. Screening  You may have the following tests or  measurements: Height, weight, and BMI. Blood pressure. Lipid and cholesterol levels. These may be checked every 5 years, or more frequently if you are over 46 years old. Skin check. Lung cancer screening. You may have this screening every year starting at age 76 if you have a 30-pack-year history of smoking and currently smoke or have quit within the past 15 years. Fecal occult blood test (FOBT) of the stool. You may have this test every year starting at age 42. Flexible sigmoidoscopy or colonoscopy. You may have a sigmoidoscopy every 5 years or a colonoscopy every 10 years starting at age 45. Prostate cancer screening. Recommendations will vary depending on your family history and other risks. Hepatitis C blood test. Hepatitis B blood test. Sexually transmitted disease (STD) testing. Diabetes screening. This is done by checking your blood sugar (glucose) after you have not eaten for a while (fasting). You may have this done every 1-3 years. Abdominal aortic aneurysm (AAA) screening. You may need this if you are a current or former smoker. Osteoporosis. You may be screened starting at age 25 if you are at high risk. Talk with your health care provider about your test results, treatment options, and if necessary, the need for more tests. Vaccines  Your health care provider may recommend certain vaccines, such as: Influenza vaccine. This is recommended every year. Tetanus, diphtheria, and acellular pertussis (Tdap, Td) vaccine. You may need a Td booster every 10 years. Zoster vaccine. You may need this after age 49. Pneumococcal 13-valent conjugate (PCV13) vaccine. One dose is recommended after age 26. Pneumococcal polysaccharide (PPSV23) vaccine. One dose is recommended after age 61. Talk to your health care provider about which screenings and vaccines you need and how often you need them.  This information is not intended to replace advice given to you by your health care provider. Make sure  you discuss any questions you have with your health care provider. Document Released: 08/17/2015 Document Revised: 04/09/2016 Document Reviewed: 05/22/2015 Elsevier Interactive Patient Education  2017 [employer] Prevention in the Home Falls can cause injuries. They can happen to people of all ages. There are many things you can do to make your home safe and to help prevent falls. What can I do on the outside of my home? Regularly fix the edges of walkways and driveways and fix any cracks. Remove anything that might make you trip as you walk through a door, such as a raised step or threshold. Trim any bushes or trees on the path to your home. Use bright outdoor lighting. Clear any walking paths of anything that might make someone trip, such as rocks or tools. Regularly check to see if handrails are loose or broken. Make sure that both sides of any steps have handrails. Any raised decks and porches should have guardrails on the edges. Have any leaves, snow, or ice cleared regularly. Use sand or salt on walking paths during winter. Clean up any spills in your garage right away. This includes oil or grease spills. What can I do in the bathroom? Use night lights. Install grab bars by the toilet and in the tub and shower. Do not use towel bars as grab bars. Use non-skid mats or decals in the tub or shower. If you need to sit down in the shower, use a plastic, non-slip stool. Keep the floor dry. Clean up any water that spills on the floor as soon as it happens. Remove soap buildup in the tub or shower regularly. Attach bath mats securely with double-sided non-slip rug tape. Do not have throw rugs and other things on the floor that can make you trip. What can I do in the bedroom? Use night lights. Make sure that you have a light by your bed that is easy to reach. Do not use any sheets or blankets that are too big for your bed. They should not hang down onto the floor. Have a firm  chair that has side arms. You can use this for support while you get dressed. Do not have throw rugs and other things on the floor that can make you trip. What can I do in the kitchen? Clean up any spills right away. Avoid walking on wet floors. Keep items that you use a lot in easy-to-reach places. If you need to reach something above you, use a strong step stool that has a grab bar. Keep electrical cords out of the way. Do not use floor polish or wax that makes floors slippery. If you must use wax, use non-skid floor wax. Do not have throw rugs and other things on the floor that can make you trip. What can I do with my stairs? Do not leave any items on the stairs. Make sure that there are handrails on both sides of the stairs and use them. Fix handrails that are broken or loose. Make sure that handrails are as long as the stairways. Check any carpeting to make sure that it is firmly attached to the stairs. Fix any carpet that is loose or worn. Avoid having throw rugs at the top or bottom of the stairs. If you do have throw rugs, attach them to the floor with carpet tape. Make sure that you have a light switch at the  top of the stairs and the bottom of the stairs. If you do not have them, ask someone to add them for you. What else can I do to help prevent falls? Wear shoes that: Do not have high heels. Have rubber bottoms. Are comfortable and fit you well. Are closed at the toe. Do not wear sandals. If you use a stepladder: Make sure that it is fully opened. Do not climb a closed stepladder. Make sure that both sides of the stepladder are locked into place. Ask someone to hold it for you, if possible. Clearly mark and make sure that you can see: Any grab bars or handrails. First and last steps. Where the edge of each step is. Use tools that help you move around (mobility aids) if they are needed. These include: Canes. Walkers. Scooters. Crutches. Turn on the lights when you go  into a dark area. Replace any light bulbs as soon as they burn out. Set up your furniture so you have a clear path. Avoid moving your furniture around. If any of your floors are uneven, fix them. If there are any pets around you, be aware of where they are. Review your medicines with your doctor. Some medicines can make you feel dizzy. This can increase your chance of falling. Ask your doctor what other things that you can do to help prevent falls. This information is not intended to replace advice given to you by your health care provider. Make sure you discuss any questions you have with your health care provider. Document Released: 05/17/2009 Document Revised: 12/27/2015 Document Reviewed: 08/25/2014 Elsevier Interactive Patient Education  2017 [employer].

## 2022-11-19 NOTE — Progress Notes (Signed)
Subjective:   William Oneill is a 77 y.o. male who presents for Medicare Annual/Subsequent preventive examination.  I connected with  Marsh Heckler on 11/19/22 by a telephone enabled telemedicine application and verified that I am speaking with the correct person using two identifiers.   I discussed the limitations of evaluation and management by telemedicine. The patient expressed understanding and agreed to proceed.  Patient location: home  Provider location: telephone/home   Review of Systems     Cardiac Risk Factors include: advanced age (>41men, >75 women);male gender;family history of premature cardiovascular disease;diabetes mellitus;hypertension     Objective:    There were no vitals filed for this visit. There is no height or weight on file to calculate BMI.     11/19/2022    2:47 PM 04/24/2021    9:50 AM 02/12/2021    4:20 PM 11/09/2020   12:37 PM 08/23/2020    1:20 PM 02/23/2020    6:19 AM 02/16/2020    2:18 PM  Advanced Directives  Does Patient Have a Medical Advance Directive? No No No No No No No  Would patient like information on creating a medical advance directive? No - Patient declined  No - Guardian declined No - Patient declined  No - Patient declined No - Patient declined    Current Medications (verified) Outpatient Encounter Medications as of 11/19/2022  Medication Sig   aspirin EC 81 MG EC tablet Take 1 tablet (81 mg total) by mouth daily. Swallow whole.   atorvastatin (LIPITOR) 10 MG tablet Take 1 tablet (10 mg total) by mouth daily.   blood glucose meter kit and supplies Dispense based on patient and insurance preference. Use up to four times daily as directed. (FOR ICD-9 250.00, 250.01).   empagliflozin (JARDIANCE) 25 MG TABS tablet Take 1 tablet (25 mg total) by mouth daily before breakfast.   glucose blood (ONETOUCH ULTRA) test strip CHECK BLOOD GLUCOSE (SUGAR) UP TO 4 TIMES DAILY   Lancets (ACCU-CHEK SOFT TOUCH) lancets Use as instructed    metFORMIN (GLUCOPHAGE) 1000 MG tablet Take 1 tablet (1,000 mg total) by mouth 2 (two) times daily with a meal.   Multiple Vitamin (MULTIVITAMIN WITH MINERALS) TABS tablet Take 1 tablet by mouth daily.   MYRBETRIQ 25 MG TB24 tablet Take 25 mg by mouth daily.   sitaGLIPtin (JANUVIA) 100 MG tablet Take 1 tablet (100 mg total) by mouth daily.   tamsulosin (FLOMAX) 0.4 MG CAPS capsule Take 0.4 mg by mouth in the morning and at bedtime.   traZODone (DESYREL) 100 MG tablet Take 1 tablet (100 mg total) by mouth at bedtime.   No facility-administered encounter medications on file as of 11/19/2022.    Allergies (verified) Patient has no known allergies.   History: Past Medical History:  Diagnosis Date   A-fib    "years ago, for a short period, never came back"   Anemia    Cancer    Cancer    colon cancer   Colon cancer    Diabetes mellitus without complication    type 2   Diabetes mellitus without complication    Dysrhythmia    High cholesterol    History of kidney stones    passed   Insomnia    Pneumonia    x1   Past Surgical History:  Procedure Laterality Date   CHOLECYSTECTOMY N/A 05/22/2021   Procedure: LAPAROSCOPIC CHOLECYSTECTOMY;  Surgeon: Abigail Miyamoto, MD;  Location: [facility];  Service: General;  Laterality: N/A;  COLON SURGERY     laparoscopic ascending colectomy  Dr. Donell Beers 02-02-18   COLONOSCOPY     LAPAROSCOPIC PARTIAL COLECTOMY N/A 02/02/2018   Procedure: LAPAROSCOPIC ASCENDING COLECTOMY;  Surgeon: Almond Lint, MD;  Location: [facility];  Service: General;  Laterality: N/A;   PORT-A-CATH REMOVAL Left 02/23/2020   Procedure: REMOVAL PORT-A-CATH;  Surgeon: Almond Lint, MD;  Location: [facility];  Service: General;  Laterality: Left;   PORTACATH PLACEMENT Left 03/03/2018   Procedure: INSERTION PORT-A-CATH;  Surgeon: Almond Lint, MD;  Location: [facility];  Service: General;  Laterality: Left;   TONSILLECTOMY     as a child   Family History  Problem Relation  Age of Onset   CAD Father    CAD Sister    CAD Brother    Heart disease Father    Heart attack Father    Heart disease Sister    Diabetes Sister    Stroke Mother    Social History   Socioeconomic History   Marital status: Single    Spouse name: Not on file   Number of children: Not on file   Years of education: Not on file   Highest education level: Not on file  Occupational History   Not on file  Tobacco Use   Smoking status: Never   Smokeless tobacco: Never  Vaping Use   Vaping Use: Never used  Substance and Sexual Activity   Alcohol use: Not Currently   Drug use: Never   Sexual activity: Yes  Other Topics Concern   Not on file  Social History Narrative   ** Merged History Encounter **       Social Determinants of Health   Financial Resource Strain: Low Risk  (11/19/2022)   Overall Financial Resource Strain (CARDIA)    Difficulty of Paying Living Expenses: Not hard at all  Food Insecurity: No Food Insecurity (11/19/2022)   Hunger Vital Sign    Worried About Running Out of Food in the Last Year: Never true    Ran Out of Food in the Last Year: Never true  Transportation Needs: No Transportation Needs (11/19/2022)   PRAPARE - Administrator, Civil Service (Medical): No    Lack of Transportation (Non-Medical): No  Physical Activity: Sufficiently Active (11/19/2022)   Exercise Vital Sign    Days of Exercise per Week: 4 days    Minutes of Exercise per Session: 40 min  Stress: No Stress Concern Present (11/19/2022)   [employer] of Occupational Health - Occupational Stress Questionnaire    Feeling of Stress : Only a little  Social Connections: Unknown (11/19/2022)   Social Connection and Isolation Panel [NHANES]    Frequency of Communication with Friends and Family: More than three times a week    Frequency of Social Gatherings with Friends and Family: Twice a week    Attends Religious Services: More than 4 times per year    Active Member of [employer] or  Organizations: Yes    Attends Engineer, structural: More than 4 times per year    Marital Status: Not on file    Tobacco Counseling Counseling given: Not Answered   Clinical Intake:  Pre-visit preparation completed: Yes  Pain : No/denies pain     Diabetes: Yes CBG done?: No Did pt. bring in CBG monitor from home?: No  How often do you need to have someone help you when you read instructions, pamphlets, or other written materials from your doctor or pharmacy?: 1 -  Never  Diabetic?  Yes  Nutrition Risk Assessment:  Has the patient had any N/V/D within the last 2 months?  No  Does the patient have any non-healing wounds?  No  Has the patient had any unintentional weight loss or weight gain?  No   Diabetes:  Is the patient diabetic?  Yes  If diabetic, was a CBG obtained today?  No  Did the patient bring in their glucometer from home?  No  How often do you monitor your CBG's? 1-2 daily.   Financial Strains and Diabetes Management:  Are you having any financial strains with the device, your supplies or your medication? No .  Does the patient want to be seen by Chronic Care Management for management of their diabetes?  No  Would the patient like to be referred to a Nutritionist or for Diabetic Management?  No   Diabetic Exams:  Diabetic Eye Exam: Completed  Pt has been advised about the importance in completing this exam. A referral has been placed today. Message sent to referral coordinator for scheduling purposes. Advised pt to expect a call from office referred to regarding appt.  Diabetic Foot Exam: . Pt has been advised about the importance in completing this exam.   Interpreter Needed?: No  Information entered by :: Remi Haggard LPN   Activities of Daily Living    11/19/2022    2:38 PM  In your present state of health, do you have any difficulty performing the following activities:  Hearing? 0  Vision? 0  Difficulty concentrating or making  decisions? 0  Walking or climbing stairs? 0  Dressing or bathing? 0  Doing errands, shopping? 0  Preparing Food and eating ? N  Using the Toilet? N  In the past six months, have you accidently leaked urine? N  Do you have problems with loss of bowel control? N  Managing your Medications? N  Managing your Finances? N  Housekeeping or managing your Housekeeping? N    Patient Care Team: Shade Flood, MD as PCP - General (Family Medicine) Sallye Lat, MD as Consulting Physician (Ophthalmology) Almond Lint, MD as Consulting Physician (Surgical Oncology) Jeani Hawking, MD as Consulting Physician (Gastroenterology) Malachy Mood, MD as Consulting Physician (Hematology) Shade Flood, MD (Family Medicine)  Indicate any recent Medical Services you may have received from other than [facility] providers in the past year (date may be approximate).     Assessment:   This is a routine wellness examination for Adrion.  Hearing/Vision screen Hearing Screening - Comments:: No trouble hearing Vision Screening - Comments:: [facility] Up to date  Dietary issues and exercise activities discussed: Current Exercise Habits: Home exercise routine, Type of exercise: walking, Time (Minutes): 40, Frequency (Times/Week): 4, Weekly Exercise (Minutes/Week): 160, Intensity: Mild   Goals Addressed             This Visit's Progress    Patient Stated       Continue current lifestyle       Depression Screen    11/19/2022    2:39 PM 05/01/2022   10:38 AM 01/16/2022   12:56 PM 11/14/2021   12:48 PM 10/02/2021    3:32 PM 06/20/2021    2:52 PM 04/24/2021    4:07 PM  PHQ 2/9 Scores  PHQ - 2 Score 1 0 0 0 0 3 0  PHQ- 9 Score 1 0  0  3 0    Fall Risk    11/19/2022    2:34 PM 05/01/2022  10:39 AM 01/16/2022   12:56 PM 11/14/2021   12:47 PM 10/02/2021    3:32 PM  Fall Risk   Falls in the past year? 0 0 0 0 0  Number falls in past yr: 0 0 0 0 0  Injury with Fall? 0 0 0 0 0  Risk for fall due to :   No Fall Risks No Fall Risks No Fall Risks No Fall Risks  Follow up Falls evaluation completed;Education provided;Falls prevention discussed Falls evaluation completed Falls evaluation completed Falls evaluation completed Falls evaluation completed    FALL RISK PREVENTION PERTAINING TO THE HOME:  Any stairs in or around the home? No  If so, are there any without handrails? No  Home free of loose throw rugs in walkways, pet beds, electrical cords, etc? Yes  Adequate lighting in your home to reduce risk of falls? Yes   ASSISTIVE DEVICES UTILIZED TO PREVENT FALLS:  Life alert? No  Use of a cane, walker or w/c? No  Grab bars in the bathroom? No  Shower chair or bench in shower? No  Elevated toilet seat or a handicapped toilet? No   TIMED UP AND GO:  Was the test performed? No .    Cognitive Function:        11/19/2022    2:37 PM 06/08/2019    1:20 PM  6CIT Screen  What Year? 0 points 0 points  What month? 0 points 0 points  What time? 0 points 0 points  Count back from 20 2 points 0 points  Months in reverse 0 points 0 points  Repeat phrase 0 points 0 points  Total Score 2 points 0 points    Immunizations Immunization History  Administered Date(s) Administered   Fluad Quad(high Dose 65+) 06/08/2019, 05/10/2020, 04/25/2022   Influenza, High Dose Seasonal PF 06/07/2018   Influenza,inj,Quad PF,6+ Mos 06/25/2015, 04/10/2016, 04/30/2017   Influenza-Unspecified 06/07/2018   Moderna Sars-Covid-2 Vaccination 10/14/2019, 08/09/2020, 05/14/2021   Pneumococcal Conjugate-13 06/25/2015   Pneumococcal Polysaccharide-23 10/03/2015   Tdap 04/02/2015, 08/24/2018, 09/21/2019   Zoster, Unspecified 04/25/2022    TDAP status: Up to date  Flu Vaccine status: Up to date  Pneumococcal vaccine status: Up to date  Covid-19 vaccine status: Completed vaccines  Qualifies for Shingles Vaccine? Yes   Zostavax completed No   Shingrix Completed?: No.    Education has been provided regarding  the importance of this vaccine. Patient has been advised to call insurance company to determine out of pocket expense if they have not yet received this vaccine. Advised may also receive vaccine at local pharmacy or Health Dept. Verbalized acceptance and understanding.  Screening Tests Health Maintenance  Topic Date Due   Zoster Vaccines- Shingrix (1 of 2) 04/22/1966   Diabetic kidney evaluation - Urine ACR  10/03/2022   COVID-19 Vaccine (4 - 2023-24 season) 12/05/2022 (Originally 04/04/2022)   HEMOGLOBIN A1C  02/04/2023   INFLUENZA VACCINE  03/05/2023   OPHTHALMOLOGY EXAM  05/01/2023   Diabetic kidney evaluation - eGFR measurement  08/07/2023   FOOT EXAM  08/07/2023   Medicare Annual Wellness (AWV)  11/19/2023   COLONOSCOPY (Pts 45-62yrs Insurance coverage will need to be confirmed)  05/25/2029   DTaP/Tdap/Td (4 - Td or Tdap) 09/20/2029   Pneumonia Vaccine 88+ Years old  Completed   Hepatitis C Screening  Completed   HPV VACCINES  Aged Out    Health Maintenance  Health Maintenance Due  Topic Date Due   Zoster Vaccines- Shingrix (1 of 2) 04/22/1966  Diabetic kidney evaluation - Urine ACR  10/03/2022    Colorectal cancer screening: Type of screening: Colonoscopy. Completed no longer required. Repeat every   years  Lung Cancer Screening: (Low Dose CT Chest recommended if Age 31-80 years, 30 pack-year currently smoking OR have quit w/in 15years.)  qualify.   Lung Cancer Screening Referral:   Additional Screening:  Hepatitis C Screening: does not qualify; Completed 2019  Vision Screening: Recommended annual ophthalmology exams for early detection of glaucoma and other disorders of the eye. Is the patient up to date with their annual eye exam?  Yes  Who is the provider or what is the name of the office in which the patient attends annual eye exams? [facility] If pt is not established with a provider, would they like to be referred to a provider to establish care? No .   Dental  Screening: Recommended annual dental exams for proper oral hygiene  Community Resource Referral / Chronic Care Management: CRR required this visit?  No   CCM required this visit?  No      Plan:     I have personally reviewed and noted the following in the patient's chart:   Medical and social history Use of alcohol, tobacco or illicit drugs  Current medications and supplements including opioid prescriptions. Patient is not currently taking opioid prescriptions. Functional ability and status Nutritional status Physical activity Advanced directives List of other physicians Hospitalizations, surgeries, and ER visits in previous 12 months Vitals Screenings to include cognitive, depression, and falls Referrals and appointments  In addition, I have reviewed and discussed with patient certain preventive protocols, quality metrics, and best practice recommendations. A written personalized care plan for preventive services as well as general preventive health recommendations were provided to patient.     Remi Haggard, LPN   [id number]   Nurse Notes:

## 2022-11-24 ENCOUNTER — Ambulatory Visit (INDEPENDENT_AMBULATORY_CARE_PROVIDER_SITE_OTHER): Payer: Medicare Other | Admitting: Family Medicine

## 2022-11-24 ENCOUNTER — Encounter: Payer: Self-pay | Admitting: Family Medicine

## 2022-11-24 VITALS — BP 128/58 | HR 51 | Temp 98.7°F | Ht 66.0 in | Wt 171.4 lb

## 2022-11-24 DIAGNOSIS — E1165 Type 2 diabetes mellitus with hyperglycemia: Secondary | ICD-10-CM | POA: Diagnosis not present

## 2022-11-24 DIAGNOSIS — Z794 Long term (current) use of insulin: Secondary | ICD-10-CM

## 2022-11-24 DIAGNOSIS — E785 Hyperlipidemia, unspecified: Secondary | ICD-10-CM | POA: Diagnosis not present
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
## 2022-11-24 NOTE — Patient Instructions (Addendum)
Great work on the [employer] loss! Continue to watch diet, I will check levels today and decide if any medication changes needed.  Thank you for coming in to see [place].  Please let me know if there are questions prior to follow-up in 3 months.

## 2022-11-24 NOTE — Assessment & Plan Note (Signed)
Anticipate improved A1c with exercise, watching diet.  Tolerating current med regimen.  At his age reasonable goal of 7.5 or below.  Check updated labs, 58-month follow-up with fasting labs at that time.  Asymptomatic with initial low blood pressure, still borderline on repeat testing but improved.  RTC precautions given.  Bradycardia noted on previous visits as well, and again asymptomatic.

## 2022-11-24 NOTE — Assessment & Plan Note (Signed)
Tolerating statin, continue same.  Plan for fasting labs next visit.

## 2022-11-25 LAB — COMPREHENSIVE METABOLIC PANEL
ALT: 15 U/L (ref 0–53)
AST: 19 U/L (ref 0–37)
Albumin: 4.1 g/dL (ref 3.5–5.2)
Alkaline Phosphatase: 59 U/L (ref 39–117)
BUN: 23 mg/dL (ref 6–23)
CO2: 29 mEq/L (ref 19–32)
Calcium: 8.9 mg/dL (ref 8.4–10.5)
Chloride: 106 mEq/L (ref 96–112)
Creatinine, Ser: 0.92 mg/dL (ref 0.40–1.50)
GFR: 81.32 mL/min (ref >60.00)
Glucose, Bld: 110 mg/dL — ABNORMAL HIGH (ref 70–99)
Potassium: 4.6 mEq/L (ref 3.5–5.1)
Sodium: 140 mEq/L (ref 135–145)
Total Bilirubin: 1.8 mg/dL — ABNORMAL HIGH (ref 0.2–1.2)
Total Protein: 6.4 g/dL (ref 6.0–8.3)

## 2022-11-25 LAB — HEMOGLOBIN A1C: Hgb A1c MFr Bld: 6.9 % — ABNORMAL HIGH (ref 4.6–6.5)

## 2022-11-26 LAB — MICROALBUMIN / CREATININE URINE RATIO
Creatinine,U: 67.8 mg/dL
Microalb Creat Ratio: 1.9 mg/g (ref 0.0–30.0)
Microalb, Ur: 1.3 mg/dL (ref 0.0–1.9)

## 2022-12-16 ENCOUNTER — Encounter: Payer: Self-pay | Admitting: Pharmacist

## 2022-12-16 ENCOUNTER — Telehealth: Payer: Self-pay | Admitting: Pharmacist

## 2022-12-16 NOTE — Telephone Encounter (Signed)
Pharmacy Quality Measure Review  This patient is appearing on the insurance-provided list for being at risk of failing the adherence measure for diabetes medications this calendar year. Also has not filled statin in 2024 so is not passing SUPD measure.   Jardiance refills for 2024 11/11/2022 25 MG TABS (disp 30, 30d supply)  08/29/2022 25 MG TABS (disp 30, 30d supply)   Januvia refills for 2024     12/09/2022 100 MG TABS (disp 30, 30d supply)  11/11/2022 100 MG TABS (disp 30, 30d supply)   Metformin - has not been refills since 2022  Atorvastatin refills     07/16/2022 10 MG TABS (disp 90, 90d supply)    Verified patient has active prescriptions for all medications mentioned above.   Phone outreach was unsuccessful to discuss  adherence with patient.   Henrene Pastor, PharmD Clinical Pharmacist [facility]

## 2023-01-07 ENCOUNTER — Telehealth: Payer: Self-pay | Admitting: Family Medicine

## 2023-01-07 NOTE — Telephone Encounter (Signed)
We are waiting on fax

## 2023-01-07 NOTE — Telephone Encounter (Signed)
[facility] will be faxing her documention that mediation management should be different.

## 2023-01-08 NOTE — Telephone Encounter (Signed)
No faxed received yet at this time

## 2023-01-09 NOTE — Telephone Encounter (Signed)
No fax received closing this request

## 2023-01-27 ENCOUNTER — Telehealth: Payer: Self-pay | Admitting: Pharmacist

## 2023-01-27 NOTE — Telephone Encounter (Signed)
Pharmacy Quality Measure Review Unsucessful patient outreach to discuss adherence. LM on VM with CB# [phone].   This patient is appearing on a report for being at risk of failing the adherence measure for cholesterol (statin) and diabetes medications this calendar year. He is also noted to be at risk of failing the adherence measure for Statin Use in Persons with Diabetes (SUPD) for 2024.   Verified the following refill information with [facility] Atorvastatin 10mg  - LR 07/16/2022 - 90 DS Jardiance 25mg  - LR 01/20/2023- 30 DS Januvia 100mg  - LR 12/09/2022- 30 DS  Metformin LR 2022 but still on patient's med list.   Patient has refills remaining on all medications - Dr Neva Seat sent in refills  08/2022.   A1c = 6.9% when checked 11/24/2022 Last LDL was 84 (08/06/2022)  Henrene Pastor, PharmD Clinical Pharmacist [facility]  [facility]
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
## 2023-02-09 ENCOUNTER — Encounter: Payer: Self-pay | Admitting: Nurse Practitioner

## 2023-02-09 ENCOUNTER — Other Ambulatory Visit: Payer: Self-pay

## 2023-02-09 ENCOUNTER — Inpatient Hospital Stay: Payer: Medicare Other

## 2023-02-09 ENCOUNTER — Inpatient Hospital Stay: Payer: Medicare Other | Attending: Nurse Practitioner | Admitting: Nurse Practitioner

## 2023-02-09 VITALS — BP 126/59 | HR 59 | Temp 98.2°F | Resp 17 | Wt 167.3 lb

## 2023-02-09 DIAGNOSIS — Z85038 Personal history of other malignant neoplasm of large intestine: Secondary | ICD-10-CM | POA: Insufficient documentation

## 2023-02-09 DIAGNOSIS — C180 Malignant neoplasm of cecum: Secondary | ICD-10-CM

## 2023-02-09 DIAGNOSIS — Z9221 Personal history of antineoplastic chemotherapy: Secondary | ICD-10-CM | POA: Diagnosis not present

## 2023-02-09 LAB — CMP (CANCER CENTER ONLY)
ALT: 16 U/L (ref 0–44)
AST: 16 U/L (ref 15–41)
Albumin: 3.9 g/dL (ref 3.5–5.0)
Alkaline Phosphatase: 71 U/L (ref 38–126)
Anion gap: 4 — ABNORMAL LOW (ref 5–15)
BUN: 26 mg/dL — ABNORMAL HIGH (ref 8–23)
CO2: 29 mmol/L (ref 22–32)
Calcium: 9.2 mg/dL (ref 8.9–10.3)
Chloride: 107 mmol/L (ref 98–111)
Creatinine: 0.93 mg/dL (ref 0.61–1.24)
GFR, Estimated: >60 mL/min (ref >60)
Glucose, Bld: 161 mg/dL — ABNORMAL HIGH (ref 70–99)
Potassium: 4 mmol/L (ref 3.5–5.1)
Sodium: 140 mmol/L (ref 135–145)
Total Bilirubin: 1.7 mg/dL — ABNORMAL HIGH (ref 0.3–1.2)
Total Protein: 6.8 g/dL (ref 6.5–8.1)

## 2023-02-09 LAB — CBC WITH DIFFERENTIAL (CANCER CENTER ONLY)
Abs Immature Granulocytes: 0.03 10*3/uL (ref 0.00–0.07)
Basophils Absolute: 0.1 10*3/uL (ref 0.0–0.1)
Basophils Relative: 1 %
Eosinophils Absolute: 0.2 10*3/uL (ref 0.0–0.5)
Eosinophils Relative: 4 %
HCT: 44.4 % (ref 39.0–52.0)
Hemoglobin: 15.3 g/dL (ref 13.0–17.0)
Immature Granulocytes: 1 %
Lymphocytes Relative: 20 %
Lymphs Abs: 1.2 10*3/uL (ref 0.7–4.0)
MCH: 31.5 pg (ref 26.0–34.0)
MCHC: 34.5 g/dL (ref 30.0–36.0)
MCV: 91.5 fL (ref 80.0–100.0)
Monocytes Absolute: 0.6 10*3/uL (ref 0.1–1.0)
Monocytes Relative: 10 %
Neutro Abs: 3.9 10*3/uL (ref 1.7–7.7)
Neutrophils Relative %: 64 %
Platelet Count: 209 10*3/uL (ref 150–400)
RBC: 4.85 MIL/uL (ref 4.22–5.81)
RDW: 12.3 % (ref 11.5–15.5)
WBC Count: 6.1 10*3/uL (ref 4.0–10.5)
nRBC: 0 % (ref 0.0–0.2)

## 2023-02-23 ENCOUNTER — Encounter: Payer: Self-pay | Admitting: Family Medicine

## 2023-02-23 ENCOUNTER — Ambulatory Visit (INDEPENDENT_AMBULATORY_CARE_PROVIDER_SITE_OTHER): Payer: Medicare Other | Admitting: Family Medicine

## 2023-02-23 VITALS — BP 132/70 | HR 84 | Temp 98.1°F | Ht 66.0 in | Wt 169.0 lb

## 2023-02-23 DIAGNOSIS — E785 Hyperlipidemia, unspecified: Secondary | ICD-10-CM

## 2023-02-23 DIAGNOSIS — G4700 Insomnia, unspecified: Secondary | ICD-10-CM | POA: Diagnosis not present

## 2023-02-23 DIAGNOSIS — E1165 Type 2 diabetes mellitus with hyperglycemia: Secondary | ICD-10-CM

## 2023-02-23 DIAGNOSIS — Z794 Long term (current) use of insulin: Secondary | ICD-10-CM | POA: Diagnosis not present

## 2023-02-23 MED ORDER — SITAGLIPTIN PHOSPHATE 100 MG PO TABS: 100.0000 mg | ORAL_TABLET | Freq: Every day | ORAL | 1 refills | Status: DC

## 2023-02-23 MED ORDER — METFORMIN HCL 1000 MG PO TABS: 1000.0000 mg | ORAL_TABLET | Freq: Two times a day (BID) | ORAL | 1 refills | Status: DC

## 2023-02-23 MED ORDER — ATORVASTATIN CALCIUM 10 MG PO TABS: 10.0000 mg | ORAL_TABLET | Freq: Every day | ORAL | 1 refills | Status: DC

## 2023-02-23 MED ORDER — TRAZODONE HCL 100 MG PO TABS: 100.0000 mg | ORAL_TABLET | Freq: Every day | ORAL | 1 refills | Status: DC

## 2023-02-23 MED ORDER — EMPAGLIFLOZIN 25 MG PO TABS: 25.0000 mg | ORAL_TABLET | Freq: Every day | ORAL | 1 refills | Status: DC

## 2023-02-23 NOTE — Patient Instructions (Signed)
Congrats on recent visit with oncology and 5 years cancer free! I will check your blood sugars, and if stable can follow-up in 6 months.  No medication changes for now.  Take care and please let me know if there are questions prior to your next appointment.
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
## 2023-02-24 LAB — LIPID PANEL
Cholesterol: 159 mg/dL (ref 0–200)
HDL: 50.7 mg/dL (ref >39.00)
NonHDL: 108.67
Total CHOL/HDL Ratio: 3
Triglycerides: 216 mg/dL — ABNORMAL HIGH (ref 0.0–149.0)
VLDL: 43.2 mg/dL — ABNORMAL HIGH (ref 0.0–40.0)

## 2023-02-24 LAB — COMPREHENSIVE METABOLIC PANEL
ALT: 20 U/L (ref 0–53)
AST: 21 U/L (ref 0–37)
Albumin: 4.4 g/dL (ref 3.5–5.2)
Alkaline Phosphatase: 70 U/L (ref 39–117)
BUN: 24 mg/dL — ABNORMAL HIGH (ref 6–23)
CO2: 27 mEq/L (ref 19–32)
Calcium: 9.5 mg/dL (ref 8.4–10.5)
Chloride: 106 mEq/L (ref 96–112)
Creatinine, Ser: 0.9 mg/dL (ref 0.40–1.50)
GFR: 83.35 mL/min (ref >60.00)
Glucose, Bld: 130 mg/dL — ABNORMAL HIGH (ref 70–99)
Potassium: 4.7 mEq/L (ref 3.5–5.1)
Sodium: 140 mEq/L (ref 135–145)
Total Bilirubin: 1.2 mg/dL (ref 0.2–1.2)
Total Protein: 7.1 g/dL (ref 6.0–8.3)

## 2023-02-24 LAB — HEMOGLOBIN A1C: Hgb A1c MFr Bld: 6.8 % — ABNORMAL HIGH (ref 4.6–6.5)

## 2023-02-24 LAB — LDL CHOLESTEROL, DIRECT: Direct LDL: 89 mg/dL

## 2023-02-26 ENCOUNTER — Encounter: Payer: Self-pay | Admitting: Gastroenterology

## 2023-03-04 ENCOUNTER — Telehealth: Payer: Self-pay

## 2023-03-04 NOTE — Telephone Encounter (Signed)
Patient returned call about lab results, he is aware. He stated that he will be taking 2/day of the atorvastatin and will give [place] a call in a week or so to let us know how he's tolerating the dose

## 2023-03-04 NOTE — Telephone Encounter (Signed)
Caller name: Izeiah Silverstone  On DPR?: Yes  Call back number: [phone] (home)  Provider they see: Shade Flood, MD  Reason for call:  Printed and mailed out current last lab results to pt.

## 2023-03-04 NOTE — Telephone Encounter (Signed)
I left a VM on both numbers for pt to return our call for his lab results

## 2023-03-09 ENCOUNTER — Telehealth: Payer: Self-pay

## 2023-03-09 NOTE — Telephone Encounter (Signed)
Blood sugar slightly elevated but overall electrolytes looked okay.  Triglycerides also slightly elevated, but overall cholesterol numbers were stable.  13-month blood sugar test is stable at 6.8.  Ideally would like to see the LDL below 70.  You can try increasing the atorvastatin to 2 pills/day for a total dose of 20 mg and if that dose is tolerated I can send in a new prescription.  Let me know.   Dr. Neva Seat

## 2023-03-10 ENCOUNTER — Telehealth: Payer: Self-pay | Admitting: Pharmacist

## 2023-03-10 NOTE — Telephone Encounter (Signed)
Pharmacy Quality Measure Review  This patient is appearing on a report for being at risk of failing the adherence measure for diabetes medications this calendar year.  He is also showing as not filling statin in 2024 - Rx for atorvastatin 10mg  was sent to his pharmacy for 90 DS 02/23/2023.  Medication: [place] Pepper Last fill date: 01/20/2023 for 30 day supply  Medication: Januvia Last fill date: 01/27/2023 for 30 day supply   Attempted outreach to patient to discuss adherence and see if any financial assistance needed. Unable to reach patient by phone. LM on VM with CB # [phone].  Henrene Pastor, PharmD Clinical Pharmacist [facility]  [facility]

## 2023-04-29 LAB — HM DIABETES EYE EXAM

## 2023-08-27 ENCOUNTER — Encounter: Payer: Self-pay | Admitting: Family Medicine

## 2023-08-27 ENCOUNTER — Ambulatory Visit: Payer: Medicare Other | Admitting: Family Medicine

## 2023-08-27 VITALS — BP 128/70 | HR 64 | Temp 98.7°F | Ht 66.0 in | Wt 180.0 lb

## 2023-08-27 DIAGNOSIS — E1165 Type 2 diabetes mellitus with hyperglycemia: Secondary | ICD-10-CM

## 2023-08-27 DIAGNOSIS — Z23 Encounter for immunization: Secondary | ICD-10-CM

## 2023-08-27 DIAGNOSIS — Z0000 Encounter for general adult medical examination without abnormal findings: Secondary | ICD-10-CM | POA: Diagnosis not present

## 2023-08-27 DIAGNOSIS — E785 Hyperlipidemia, unspecified: Secondary | ICD-10-CM

## 2023-08-27 DIAGNOSIS — G4700 Insomnia, unspecified: Secondary | ICD-10-CM | POA: Diagnosis not present

## 2023-08-27 DIAGNOSIS — Z794 Long term (current) use of insulin: Secondary | ICD-10-CM

## 2023-08-27 MED ORDER — SITAGLIPTIN PHOSPHATE 100 MG PO TABS: 100.0000 mg | ORAL_TABLET | Freq: Every day | ORAL | 1 refills | Status: DC

## 2023-08-27 MED ORDER — METFORMIN HCL 1000 MG PO TABS: 1000.0000 mg | ORAL_TABLET | Freq: Two times a day (BID) | ORAL | 1 refills | Status: DC

## 2023-08-27 MED ORDER — TRAZODONE HCL 100 MG PO TABS: 100.0000 mg | ORAL_TABLET | Freq: Every day | ORAL | 1 refills | Status: DC

## 2023-08-27 MED ORDER — EMPAGLIFLOZIN 25 MG PO TABS: 25.0000 mg | ORAL_TABLET | Freq: Every day | ORAL | 1 refills | Status: DC

## 2023-08-27 MED ORDER — ATORVASTATIN CALCIUM 10 MG PO TABS: 10.0000 mg | ORAL_TABLET | Freq: Every day | ORAL | 1 refills | Status: DC

## 2023-08-27 NOTE — Patient Instructions (Signed)
I will check labs and let you know if any medication changes.  If A1c is only slightly elevated I think watching diet would be a reasonable approach initially.  Plan for repeat visit in 6 months but happy to see you sooner if needed.  There was some wax in the right ear canal.  Over-the-counter Debrox is an option but if that is not effective on clearing out the ear or ear becomes more blocked, please return and we can recheck that ear with option of in office procedure to clear wax if needed.  Take care!  Earwax Buildup, Adult Your ears make something called earwax. It helps keep germs called bacteria away and protects the skin in your ears. Sometimes, too much earwax can build up. This can cause discomfort or make it harder to hear. What are the causes? Earwax buildup can happen when you have too much earwax in your ears. Earwax is made in the outer part of your ear canal. It's supposed to fall out in small amounts over time. But if your ears aren't able to clean themselves like they should, earwax can build up. What increases the risk? You're more likely to get earwax buildup if: You clean your ears with cotton swabs. You pick at your ears. You use earplugs or in-ear headphones a lot. You wear hearing aids. You may also be more likely to get it if: You're male. You're older. Your ears naturally make more earwax. You have narrow ear canals or extra hair in your ears. Your earwax is too thick or sticky. You have eczema. You're dehydrated. This means there's not enough fluid in your body. What are the signs or symptoms? Symptoms of earwax buildup include: Not being able to hear as well. A feeling of fullness in your ear. Feeling like your ear is plugged. Fluid coming from your ear. Ear pain or an itchy ear. Ringing in your ear. Coughing or problems with balance. How is this diagnosed? Earwax buildup may be diagnosed based on your symptoms, medical history, and an ear exam. During  the exam, your health care provider will look into your ear with a tool called an otoscope. You may also have tests, such as a hearing test. How is this treated? Earwax buildup may be treated by: Using ear drops. Having the earwax removed by a provider. The provider may: Flush the ear with water. Use a tool called a curette that has a loop on the end. Use a suction device. Having surgery. This may be done in severe cases. Follow these instructions at home:  Cleaning your ears Clean your ears as told by your provider. You can clean the outside of your ears with a washcloth or tissue. Do not overclean your ears. Do not put anything into your ear unless told. This includes cotton swabs. General instructions Take over-the-counter and prescription medicines only as told by your provider. Drink enough fluid to keep your pee (urine) pale yellow. This helps thin the earwax. If you have hearing aids, clean them as told. Keep all follow-up visits. If earwax builds up in your ears often or if you use hearing aids, ask your provider how often you should have your ears cleaned. Contact a health care provider if: Your ear pain gets worse. You have a fever. You have pus, blood, or other fluid coming from your ear. You have hearing loss. You have ringing in your ears that won't go away. You feel like the room is spinning. This is called vertigo.  Your symptoms don't get better with treatment. This information is not intended to replace advice given to you by your health care provider. Make sure you discuss any questions you have with your health care provider. Document Revised: 10/02/2022 Document Reviewed: 10/02/2022 Elsevier Patient Education  2024 [employer].  Preventive Care 65 Years and Older, Male Preventive care refers to lifestyle choices and visits with your health care provider that can promote health and wellness. Preventive care visits are also called wellness exams. What can I  expect for my preventive care visit? Counseling During your preventive care visit, your health care provider may ask about your: Medical history, including: Past medical problems. Family medical history. History of falls. Current health, including: Emotional well-being. Home life and relationship well-being. Sexual activity. Memory and ability to understand (cognition). Lifestyle, including: Alcohol, nicotine or tobacco, and drug use. Access to firearms. Diet, exercise, and sleep habits. Work and work Astronomer. Sunscreen use. Safety issues such as seatbelt and bike helmet use. Physical exam Your health care provider will check your: Height and weight. These may be used to calculate your BMI (body mass index). BMI is a measurement that tells if you are at a healthy weight. Waist circumference. This measures the distance around your waistline. This measurement also tells if you are at a healthy weight and may help predict your risk of certain diseases, such as type 2 diabetes and high blood pressure. Heart rate and blood pressure. Body temperature. Skin for abnormal spots. What immunizations do I need?  Vaccines are usually given at various ages, according to a schedule. Your health care provider will recommend vaccines for you based on your age, medical history, and lifestyle or other factors, such as travel or where you work. What tests do I need? Screening Your health care provider may recommend screening tests for certain conditions. This may include: Lipid and cholesterol levels. Diabetes screening. This is done by checking your blood sugar (glucose) after you have not eaten for a while (fasting). Hepatitis C test. Hepatitis B test. HIV (human immunodeficiency virus) test. STI (sexually transmitted infection) testing, if you are at risk. Lung cancer screening. Colorectal cancer screening. Prostate cancer screening. Abdominal aortic aneurysm (AAA) screening. You may need  this if you are a current or former smoker. Talk with your health care provider about your test results, treatment options, and if necessary, the need for more tests. Follow these instructions at home: Eating and drinking  Eat a diet that includes fresh fruits and vegetables, whole grains, lean protein, and low-fat dairy products. Limit your intake of foods with high amounts of sugar, saturated fats, and salt. Take vitamin and mineral supplements as recommended by your health care provider. Do not drink alcohol if your health care provider tells you not to drink. If you drink alcohol: Limit how much you have to 0-2 drinks a day. Know how much alcohol is in your drink. In the [place] one drink equals one 12 oz bottle of beer (355 mL), one 5 oz glass of wine (148 mL), or one 1 oz glass of hard liquor (44 mL). Lifestyle Brush your teeth every morning and night with fluoride toothpaste. Floss one time each day. Exercise for at least 30 minutes 5 or more days each week. Do not use any products that contain nicotine or tobacco. These products include cigarettes, chewing tobacco, and vaping devices, such as e-cigarettes. If you need help quitting, ask your health care provider. Do not use drugs. If you are sexually active,  practice safe sex. Use a condom or other form of protection to prevent STIs. Take aspirin only as told by your health care provider. Make sure that you understand how much to take and what form to take. Work with your health care provider to find out whether it is safe and beneficial for you to take aspirin daily. Ask your health care provider if you need to take a cholesterol-lowering medicine (statin). Find healthy ways to manage stress, such as: Meditation, yoga, or listening to music. Journaling. Talking to a trusted person. Spending time with friends and family. Safety Always wear your seat belt while driving or riding in a vehicle. Do not drive: If you have been drinking  alcohol. Do not ride with someone who has been drinking. When you are tired or distracted. While texting. If you have been using any mind-altering substances or drugs. Wear a helmet and other protective equipment during sports activities. If you have firearms in your house, make sure you follow all gun safety procedures. Minimize exposure to UV radiation to reduce your risk of skin cancer. What's next? Visit your health care provider once a year for an annual wellness visit. Ask your health care provider how often you should have your eyes and teeth checked. Stay up to date on all vaccines. This information is not intended to replace advice given to you by your health care provider. Make sure you discuss any questions you have with your health care provider. Document Revised: 01/16/2021 Document Reviewed: 01/16/2021 Elsevier Patient Education  2024 [employer].
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
## 2023-08-28 LAB — COMPREHENSIVE METABOLIC PANEL
ALT: 16 U/L (ref 0–53)
AST: 16 U/L (ref 0–37)
Albumin: 4.1 g/dL (ref 3.5–5.2)
Alkaline Phosphatase: 68 U/L (ref 39–117)
BUN: 20 mg/dL (ref 6–23)
CO2: 30 meq/L (ref 19–32)
Calcium: 8.9 mg/dL (ref 8.4–10.5)
Chloride: 102 meq/L (ref 96–112)
Creatinine, Ser: 0.87 mg/dL (ref 0.40–1.50)
GFR: 83.91 mL/min (ref >60.00)
Glucose, Bld: 185 mg/dL — ABNORMAL HIGH (ref 70–99)
Potassium: 4 meq/L (ref 3.5–5.1)
Sodium: 138 meq/L (ref 135–145)
Total Bilirubin: 1.3 mg/dL — ABNORMAL HIGH (ref 0.2–1.2)
Total Protein: 6.4 g/dL (ref 6.0–8.3)

## 2023-08-28 LAB — LIPID PANEL
Cholesterol: 197 mg/dL (ref 0–200)
HDL: 54.9 mg/dL (ref >39.00)
LDL Cholesterol: 115 mg/dL — ABNORMAL HIGH (ref 0–99)
NonHDL: 142.47
Total CHOL/HDL Ratio: 4
Triglycerides: 137 mg/dL (ref 0.0–149.0)
VLDL: 27.4 mg/dL (ref 0.0–40.0)

## 2023-08-28 LAB — HEMOGLOBIN A1C: Hgb A1c MFr Bld: 8.5 % — ABNORMAL HIGH (ref 4.6–6.5)

## 2023-08-31 ENCOUNTER — Telehealth: Payer: Self-pay | Admitting: Family Medicine

## 2023-08-31 NOTE — Telephone Encounter (Signed)
Pt came by to let both Dr Neva Seat and Thea Silversmith know he's going [facility] for hearing aide consultation today and also asked was his lab results back. I let him know he'll be contacted when his labs are in.

## 2023-08-31 NOTE — Telephone Encounter (Signed)
Noted.  I did place a lab result note.  Thanks

## 2023-08-31 NOTE — Telephone Encounter (Signed)
FYI

## 2023-09-01 NOTE — Telephone Encounter (Signed)
Called patient and relayed results from note, patient had no questions and did schedule appt as requested

## 2023-09-03 ENCOUNTER — Encounter (HOSPITAL_COMMUNITY): Payer: Self-pay | Admitting: Emergency Medicine

## 2023-09-03 ENCOUNTER — Emergency Department (HOSPITAL_COMMUNITY): Payer: Medicare Other

## 2023-09-03 ENCOUNTER — Other Ambulatory Visit: Payer: Self-pay

## 2023-09-03 ENCOUNTER — Emergency Department (HOSPITAL_COMMUNITY)
Admission: EM | Admit: 2023-09-03 | Discharge: 2023-09-03 | Disposition: A | Discharge status: Home / Self Care | Payer: Medicare Other | Attending: Emergency Medicine | Admitting: Emergency Medicine

## 2023-09-03 DIAGNOSIS — Z20822 Contact with and (suspected) exposure to covid-19: Secondary | ICD-10-CM | POA: Diagnosis not present

## 2023-09-03 DIAGNOSIS — Z85038 Personal history of other malignant neoplasm of large intestine: Secondary | ICD-10-CM | POA: Diagnosis not present

## 2023-09-03 DIAGNOSIS — E119 Type 2 diabetes mellitus without complications: Secondary | ICD-10-CM | POA: Diagnosis not present

## 2023-09-03 DIAGNOSIS — J101 Influenza due to other identified influenza virus with other respiratory manifestations: Secondary | ICD-10-CM | POA: Diagnosis not present

## 2023-09-03 DIAGNOSIS — R059 Cough, unspecified: Secondary | ICD-10-CM | POA: Diagnosis present

## 2023-09-03 LAB — RESP PANEL BY RT-PCR (RSV, FLU A&B, COVID)  RVPGX2
Influenza A by PCR: POSITIVE — AB
Influenza B by PCR: NEGATIVE
Resp Syncytial Virus by PCR: NEGATIVE
SARS Coronavirus 2 by RT PCR: NEGATIVE

## 2023-09-03 LAB — GROUP A STREP BY PCR: Group A Strep by PCR: NOT DETECTED

## 2023-09-03 MED ORDER — ACETAMINOPHEN 500 MG PO TABS
1000.0000 mg | ORAL_TABLET | Freq: Once | ORAL | Status: AC
  Administered 2023-09-03: 1000 mg via ORAL
  Filled 2023-09-03: qty 2

## 2023-09-03 MED ORDER — OSELTAMIVIR PHOSPHATE 75 MG PO CAPS
75.0000 mg | ORAL_CAPSULE | Freq: Once | ORAL | Status: AC
  Administered 2023-09-03: 75 mg via ORAL
  Filled 2023-09-03: qty 1

## 2023-09-03 MED ORDER — OSELTAMIVIR PHOSPHATE 75 MG PO CAPS: 75.0000 mg | ORAL_CAPSULE | Freq: Two times a day (BID) | ORAL | 0 refills | Status: DC

## 2023-09-03 NOTE — ED Triage Notes (Signed)
Pt reports productive cough with discolored sputum and sore throat since yesterday. Denies any CP or SOB. Denies hx of CHF or COPD.

## 2023-09-03 NOTE — Discharge Instructions (Signed)
You were seen in the emergency department for cough, congestion, and sore throat.  You were found to have the flu.  We started you on Tamiflu; we are prescribing the remainder of your treatment.  Please take Tylenol or ibuprofen as needed for fever and other symptoms.  If you experience significantly symptoms, shortness of breath, loss of consciousness, inability keep food or drink down, or any other concerns, return to the ED for reevaluation.

## 2023-09-03 NOTE — ED Provider Notes (Cosign Needed)
[place] EMERGENCY DEPARTMENT AT [facility] Provider Note  MDM   HPI/ROS:  William Oneill is a 78 y.o. male with pertinent past medical history of type 2 diabetes who presents for evaluation of cough, sore throat.  Patient reports onset of cough, congestion, rhinorrhea, sore throat yesterday afternoon.  He denies any associated fever, chest pain, dyspnea, nausea or vomiting, abdominal pain, diarrhea, urinary symptoms.  He states he has been able to tolerate p.o. intake without difficulty.  He has been trying to take Mucinex and throat lozenges without significant improvement of symptoms, so he decided to come to the ED for further evaluation.   Physical exam is notable for: - Mild oropharyngeal erythema without swelling or exudates  On my initial evaluation, patient is:  -Vital signs stable. Patient afebrile, hemodynamically stable, and non-toxic appearing. -Subsequently a noted to be febrile to 101.1 on repeat vital signs, other vital signs stable -Chest x-ray without evidence of pneumonia, pneumothorax -Viral testing positive for influenza A -Negative strep PCR  This patient's current presentation, including their history and physical exam, is most consistent with influenza A infection. Other diagnoses were considered including (but not limited to) peritonsillar abscess, retropharyngeal abscess, pneumonia, strep, mono. These are considered less likely due to history of present illness, physical exam findings, workup as above. Considered meningitis, however patient's symptoms, vital signs, physical exam findings including lack of meningismus seem grossly less consistent at this time.  Patient able to tolerate p.o. intake without difficulty while in the emergency department.  No signs or symptoms that would concern for ACS, myocarditis, pericarditis, new onset heart failure.  Patient received Tylenol as well as initial dose of Tamiflu while in the emergency department.  He will  complete the remaining course of Tamiflu on discharge.  Patient was advised to follow-up closely with his PCP next week for reevaluation if symptoms persisting.  He was comfortable with this plan.  Strict return precautions were provided.  Patient remained stable and had no acute events while under my care in the emergency department.   Disposition:  I discussed the plan for discharge with the patient and/or their surrogate at bedside prior to discharge and they were in agreement with the plan and verbalized understanding of the return precautions provided. All questions answered to the best of my ability. Ultimately, the patient was discharged in stable condition with stable vital signs. I am reassured that they are capable of close follow up and good social support at home.   Clinical Impression:  1. Influenza A     Rx / DC Orders ED Discharge Orders          Ordered    oseltamivir (TAMIFLU) 75 MG capsule  2 times daily        09/03/23 2245            The plan for this patient was discussed with Dr. Dalene Seltzer, who voiced agreement and who oversaw evaluation and treatment of this patient.   Clinical Complexity A medically appropriate history, review of systems, and physical exam was performed.  My independent interpretations of EKG, labs, and radiology are documented in the ED course above.   If decision rules were used in this patient's evaluation, they are listed below.   Patient's presentation is most consistent with acute complicated illness / injury requiring diagnostic workup.  Medical Decision Making Amount and/or Complexity of Data Reviewed Radiology: ordered.  Risk OTC drugs. Prescription drug management.    HPI/ROS  See MDM section for pertinent HPI and ROS. A complete ROS was performed with pertinent positives/negatives noted above.   Past Medical History:  Diagnosis Date   A-fib [facility])    "years ago, for a short period, never came back"   Anemia     Cancer (HCC)    Cancer (HCC)    colon cancer   Colon cancer (HCC)    Diabetes mellitus without complication (HCC)    type 2   Diabetes mellitus without complication (HCC)    Dysrhythmia    High cholesterol    History of kidney stones    passed   Insomnia    Pneumonia    x1    Past Surgical History:  Procedure Laterality Date   CHOLECYSTECTOMY N/A 05/22/2021   Procedure: LAPAROSCOPIC CHOLECYSTECTOMY;  Surgeon: Abigail Miyamoto, MD;  Location: [facility] OR;  Service: General;  Laterality: N/A;   COLON SURGERY     laparoscopic ascending colectomy  Dr. Donell Beers 02-02-18   COLONOSCOPY     LAPAROSCOPIC PARTIAL COLECTOMY N/A 02/02/2018   Procedure: LAPAROSCOPIC ASCENDING COLECTOMY;  Surgeon: Almond Lint, MD;  Location: [facility] ORS;  Service: General;  Laterality: N/A;   PORT-A-CATH REMOVAL Left 02/23/2020   Procedure: REMOVAL PORT-A-CATH;  Surgeon: Almond Lint, MD;  Location: [facility] OR;  Service: General;  Laterality: Left;   PORTACATH PLACEMENT Left 03/03/2018   Procedure: INSERTION PORT-A-CATH;  Surgeon: Almond Lint, MD;  Location: [facility];  Service: General;  Laterality: Left;   TONSILLECTOMY     as a child      Physical Exam   Vitals:   09/03/23 1416 09/03/23 1824 09/03/23 2020  BP: (!) 157/70 (!) 156/65 (!) 150/76  Pulse: 93 92 90  Resp: 17 16 17   Temp: 98.3 F (36.8 C) (!) 101.1 F (38.4 C) 98.6 F (37 C)  TempSrc: Oral Oral   SpO2: 98% 96% 97%    Physical Exam Gen: NAD. Appears comfortable HENT: Conjunctiva clear, PERRL, EOMI. MMM.  Mild oropharyngeal erythema with no tonsillar swelling or exudates noted. CV: RRR. No M/R/G Pulm: Lungs CTAB with no wheezing, rales, or rhonchi.  GI: Abdomen soft, non-tender, non-distended. Normal bowel sounds in all 4 quadrants. MSK/Skin: No lower extremity edema. Extremities warm, well-perfused with 2+ pulses in all 4 extremities. Neuro: A&Ox3. GCS 15. Moves all extremities.     Procedures   If procedures were preformed  on this patient, they are listed below:  Procedures   Mikeal Hawthorne, MD Emergency Medicine PGY-2   Please note that this documentation was produced with the assistance of voice-to-text technology and may contain errors.    Mikeal Hawthorne, MD 09/04/23 [phone]

## 2023-09-09 ENCOUNTER — Encounter: Payer: Self-pay | Admitting: Family Medicine

## 2023-09-09 ENCOUNTER — Ambulatory Visit: Payer: Medicare Other | Admitting: Family Medicine

## 2023-09-09 VITALS — BP 132/72 | HR 81 | Temp 98.1°F | Ht 66.0 in | Wt 174.4 lb

## 2023-09-09 DIAGNOSIS — J101 Influenza due to other identified influenza virus with other respiratory manifestations: Secondary | ICD-10-CM

## 2023-09-09 MED ORDER — BENZONATATE 200 MG PO CAPS: 200.0000 mg | ORAL_CAPSULE | Freq: Three times a day (TID) | ORAL | 0 refills | Status: DC | PRN

## 2023-09-09 NOTE — Progress Notes (Signed)
   Subjective:    Patient ID: William Oneill, male    DOB: 1945/10/25, 78 y.o.   MRN: [id number]  HPI Flu- pt went to ER on 1/30 and was dx'd w/ Flu A.  Prescribed Tamiflu .  'i've been feeling pretty bad'.  + cough, runny nose, chills.  Got OTC Robitussin and Mucinex  w/ some relief.  Pt reports feeling better than last week.   Review of Systems For ROS see HPI     Objective:   Physical Exam Vitals reviewed.  Constitutional:      General: He is not in acute distress.    Appearance: Normal appearance. He is not ill-appearing.  HENT:     Head: Normocephalic and atraumatic.     Right Ear: Tympanic membrane and ear canal normal.     Left Ear: Tympanic membrane and ear canal normal.     Nose: Congestion present.     Comments: Denies TTP over frontal and maxillary sinuses Eyes:     Extraocular Movements: Extraocular movements intact.     Conjunctiva/sclera: Conjunctivae normal.  Cardiovascular:     Rate and Rhythm: Normal rate and regular rhythm.  Pulmonary:     Effort: Pulmonary effort is normal. No respiratory distress.     Breath sounds: No wheezing or rhonchi.  Musculoskeletal:     Cervical back: Neck supple.  Lymphadenopathy:     Cervical: No cervical adenopathy.  Skin:    General: Skin is warm and dry.  Neurological:     General: No focal deficit present.     Mental Status: He is alert and oriented to person, place, and time.           Assessment & Plan:   Influenza A- new.  Pt was dx'd on 1/30 w/ flu A.  Reports he has been feeling bad since.  He does admit that he is feeling somewhat better than last week.  Reviewed that this is a virus and can take 7-14 days to resolve.  No abx needed.  Cough meds prn.  Reviewed supportive care and red flags that should prompt return.  Pt expressed understanding and is in agreement w/ plan.

## 2023-09-09 NOTE — Patient Instructions (Signed)
 Follow up as needed or as scheduled The flu is a virus and takes 10-14 days to run its course USE the cough pills as needed Drink plenty of fluids REST! Call with any questions or concerns Hang in there!

## 2023-09-16 ENCOUNTER — Ambulatory Visit: Payer: Medicare Other | Admitting: Family Medicine

## 2023-09-24 ENCOUNTER — Ambulatory Visit: Payer: Medicare Other | Admitting: Family Medicine

## 2023-10-28 ENCOUNTER — Ambulatory Visit (INDEPENDENT_AMBULATORY_CARE_PROVIDER_SITE_OTHER): Payer: Medicare Other | Admitting: Family Medicine

## 2023-10-28 ENCOUNTER — Encounter: Payer: Self-pay | Admitting: Family Medicine

## 2023-10-28 VITALS — BP 126/70 | HR 68 | Temp 98.7°F | Ht 66.0 in | Wt 176.8 lb

## 2023-10-28 DIAGNOSIS — E1165 Type 2 diabetes mellitus with hyperglycemia: Secondary | ICD-10-CM

## 2023-10-28 DIAGNOSIS — Z794 Long term (current) use of insulin: Secondary | ICD-10-CM

## 2023-10-28 DIAGNOSIS — E785 Hyperlipidemia, unspecified: Secondary | ICD-10-CM

## 2023-10-28 MED ORDER — TRULICITY 0.75 MG/0.5ML ~~LOC~~ SOAJ: 0.7500 mg | SUBCUTANEOUS | 1 refills | Status: DC

## 2023-10-28 NOTE — Patient Instructions (Addendum)
 Thank you for coming today.  Blood sugar is running a little bit too high.  We can try to see if a medication called Trulicity will be covered and if that is authorized and not too expensive at the pharmacy, start that medicine in place of the Januvia.  Once you start the Trulicity, stop Januvia.  Continue Jardiance, continue metformin same doses for now.  If Trulicity is not covered or too expensive, we can look at other options.  Let me know.  I will see you in 1 month.  Dulaglutide Injection What is this medication? DULAGLUTIDE (DOO la GLOO tide) treats type 2 diabetes. It works by increasing insulin levels in your body, which decreases your blood sugar (glucose). It also reduces the amount of sugar released into your blood and slows down your digestion. It may also be used to lower the risk of heart attack and stroke in people with type 2 diabetes. Changes to diet and exercise are often combined with this medication. This medicine may be used for other purposes; ask your health care provider or pharmacist if you have questions. COMMON BRAND NAME(S): Trulicity What should I tell my care team before I take this medication? They need to know if you have any of these conditions: Eye disease caused by diabetes Gallbladder disease Have or have had pancreatitis Having surgery Kidney disease Liver disease Personal or family history of MEN 2, a condition that causes endocrine gland tumors Personal or family history of thyroid cancer Stomach or intestine problems, such as problems digesting food An unusual or allergic reaction to dulaglutide, other medications, foods, dyes, or preservatives Pregnant or trying to get pregnant Breastfeeding How should I use this medication? This medication is injected under the skin. You will be taught how to prepare and give it. Take it as directed on the prescription label on the same day of each week. Do NOT prime the pen. Keep taking it unless your care team tells  you to stop. If you use this medication with insulin, you should inject this medication and the insulin separately. Do not mix them together. Do not give the injections right next to each other. Change (rotate) injection sites with each injection. This medication comes with INSTRUCTIONS FOR USE. Ask your pharmacist for directions on how to use this medication. Read the information carefully. Talk to your pharmacist or care team if you have questions. It is important that you put your used needles and syringes in a special sharps container. Do not put them in a trash can. If you do not have a sharps container, call your pharmacist or care team to get one. A special MedGuide will be given to you by the pharmacist with each prescription and refill. Be sure to read this information carefully each time. Talk to your care team about the use of this medication in children. While it may be prescribed for children as young as 10 years for selected conditions, precautions do apply. Overdosage: If you think you have taken too much of this medicine contact a poison control center or emergency room at once. NOTE: This medicine is only for you. Do not share this medicine with others. What if I miss a dose? If you miss a dose, take it as soon as you can unless it is more than 3 days late. If it is more than 3 days late, skip the missed dose. Take the next dose at the normal time. What may interact with this medication? Other medications for diabetes  Many medications may cause changes in blood sugar, these include: Alcohol Antiviral medications for HIV or AIDS Aspirin and aspirin-like medications Certain medications for blood pressure, heart disease, irregular heartbeat Chromium Diuretics Estrogen or progestin hormones Fenofibrate Gemfibrozil Isoniazid Lanreotide MAOIs, such as Carbex, Eldepryl, Marplan, Nardil, or Parnate Medications for allergies, asthma, cold, or cough Medications for mental health  conditions Medications for weight loss Niacin Nicotine NSAIDs, medications for pain and inflammation, such as ibuprofen or naproxen Octreotide Pasireotide Pentamidine Phenytoin Probenecid Quinolone antibiotics such as ciprofloxacin, levofloxacin, or ofloxacin Some herbal dietary supplements Steroid medications, such as prednisone or cortisone Sulfamethoxazole; trimethoprim Testosterone or anabolic steroids Thyroid hormones Some medications can hide the warning symptoms of low blood sugar (hypoglycemia). You may need to monitor your blood sugar more closely if you are taking one of these medications. These include: Beta blockers, such as atenolol, metoprolol, or propranolol Clonidine Guanethidine Reserpine This list may not describe all possible interactions. Give your health care provider a list of all the medicines, herbs, non-prescription drugs, or dietary supplements you use. Also tell them if you smoke, drink alcohol, or use illegal drugs. Some items may interact with your medicine. What should I watch for while using this medication? Visit your care team for regular checks on your progress. Tell your care team if your symptoms do not start to get better or if they get worse. You may need blood work done while you are taking this medication. Your care team will monitor your HbA1C (A1C). This test shows what your average blood sugar (glucose) level was over the past 2 to 3 months. Know the symptoms of low blood sugar and know how to treat it. Always carry a source of quick sugar with you. Examples include hard sugar candy or glucose tablets. Make sure others know that you can choke if you eat or drink if your blood sugar is too low and you are unable to care for yourself. Get medical help at once. Tell your care team if you have high blood sugar. Your medication dose may change if your body is under stress. Some types of stress that may affect your blood sugar include fever, infection,  and surgery. Do not share pens or cartridges with anyone, even if the needle is changed. Each pen should only be used by one person. Sharing could cause an infection. Wear a medical ID bracelet or chain. Carry a card that describes your condition. List the medications and doses you take on the card. Talk to your care team about your risk of cancer. You may be more at risk for certain types of cancer if you take this medication. Talk to your care team right away if you have a lump or swelling in your neck, hoarseness that does not go away, trouble swallowing, shortness of breath, or trouble breathing. Make sure you stay hydrated while taking this medication. Drink water often. Eat fruits and veggies that have a high water content. Drink more water when it is hot or you are active. Talk to your care team right away if you have fever, infection, vomiting, diarrhea, or if you sweat a lot while taking this medication. The loss of too much body fluid may make it dangerous for you to take this medication. If you are going to need surgery or a procedure, tell your care team that you are taking this medication. What side effects may I notice from receiving this medication? Side effects that you should report to your care team as soon  as possible: Allergic reactions--skin rash, itching, hives, swelling of the face, lips, tongue, or throat Change in vision Dehydration--increased thirst, dry mouth, feeling faint or lightheaded, headache, dark yellow or brown urine Kidney injury--decrease in the amount of urine, swelling of the ankles, hands, or feet Pancreatitis--severe stomach pain that spreads to your back or gets worse after eating or when touched, fever, nausea, vomiting Thoughts of suicide or self-harm, worsening mood, feelings of depression Thyroid cancer--new mass or lump in the neck, pain or trouble swallowing, trouble breathing, hoarseness Side effects that usually do not require medical attention  (report these to your care team if they continue or are bothersome): Diarrhea Loss of appetite Nausea Upset stomach This list may not describe all possible side effects. Call your doctor for medical advice about side effects. You may report side effects to FDA at 1-800-FDA-1088. Where should I keep my medication? Keep out of the reach of children and pets. Refrigeration (preferred): Store unopened pens in a refrigerator between 2 and 8 degrees C (36 and 46 degrees F). Keep it in the original carton until you are ready to take it. Do not freeze or use if the medication has been frozen. Protect from light. Get rid of any unused medication after the expiration date on the label. Room Temperature: The pen may be stored at room temperature below 30 degrees C (86 degrees F) for up to a total of 14 days if needed. Protect from light. Avoid exposure to extreme heat. If it is stored at room temperature, throw away any unused medication after 14 days or after it expires, whichever is first. To get rid of medications that are no longer needed or have expired: Take the medication to a medication take-back program. Check with your pharmacy or law enforcement to find a location. If you cannot return the medication, ask your pharmacist or care team how to get rid of this medication safely. NOTE: This sheet is a summary. It may not cover all possible information. If you have questions about this medicine, talk to your doctor, pharmacist, or health care provider.  2024 [employer]/Gold Standard (2023-07-03 00:00:00)
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
## 2023-10-29 LAB — BASIC METABOLIC PANEL WITH GFR
BUN: 24 mg/dL — ABNORMAL HIGH (ref 6–23)
CO2: 28 meq/L (ref 19–32)
Calcium: 9 mg/dL (ref 8.4–10.5)
Chloride: 102 meq/L (ref 96–112)
Creatinine, Ser: 0.89 mg/dL (ref 0.40–1.50)
GFR: 83.24 mL/min (ref >60.00)
Glucose, Bld: 193 mg/dL — ABNORMAL HIGH (ref 70–99)
Potassium: 4.8 meq/L (ref 3.5–5.1)
Sodium: 136 meq/L (ref 135–145)

## 2023-11-02 ENCOUNTER — Telehealth: Payer: Self-pay

## 2023-11-02 NOTE — Telephone Encounter (Signed)
-----   Message from Shade Flood sent at 11/01/2023  5:03 PM EDT ----- Blood sugar up at 193, other electrolytes are overall stable.  Continue same plan of starting Trulicity and stop Januvia if that is covered.  Let me know if there are questions.

## 2023-11-02 NOTE — Telephone Encounter (Signed)
 Called patient to relay lab results, Left VM to return call

## 2023-11-03 NOTE — Telephone Encounter (Signed)
 Called patient to discuss lab work, no answer, left a message for the patient to call back to discuss

## 2023-11-04 NOTE — Telephone Encounter (Signed)
 Sent patient letter.

## 2023-11-24 ENCOUNTER — Encounter

## 2023-11-30 ENCOUNTER — Encounter: Payer: Self-pay | Admitting: Family Medicine

## 2023-11-30 ENCOUNTER — Encounter: Payer: Self-pay | Admitting: Pharmacist

## 2023-11-30 ENCOUNTER — Ambulatory Visit (INDEPENDENT_AMBULATORY_CARE_PROVIDER_SITE_OTHER): Admitting: Family Medicine

## 2023-11-30 ENCOUNTER — Ambulatory Visit: Admitting: Family Medicine

## 2023-11-30 VITALS — BP 118/74 | HR 55 | Temp 98.1°F | Ht 66.0 in | Wt 171.0 lb

## 2023-11-30 DIAGNOSIS — Z7985 Long-term (current) use of injectable non-insulin antidiabetic drugs: Secondary | ICD-10-CM

## 2023-11-30 DIAGNOSIS — E1165 Type 2 diabetes mellitus with hyperglycemia: Secondary | ICD-10-CM

## 2023-11-30 DIAGNOSIS — E785 Hyperlipidemia, unspecified: Secondary | ICD-10-CM | POA: Diagnosis not present

## 2023-11-30 DIAGNOSIS — Z7984 Long term (current) use of oral hypoglycemic drugs: Secondary | ICD-10-CM | POA: Diagnosis not present

## 2023-11-30 LAB — COMPREHENSIVE METABOLIC PANEL WITH GFR
ALT: 16 U/L (ref 0–53)
AST: 19 U/L (ref 0–37)
Albumin: 4.3 g/dL (ref 3.5–5.2)
Alkaline Phosphatase: 58 U/L (ref 39–117)
BUN: 25 mg/dL — ABNORMAL HIGH (ref 6–23)
CO2: 26 meq/L (ref 19–32)
Calcium: 8.9 mg/dL (ref 8.4–10.5)
Chloride: 104 meq/L (ref 96–112)
Creatinine, Ser: 0.88 mg/dL (ref 0.40–1.50)
GFR: 83.47 mL/min (ref >60.00)
Glucose, Bld: 137 mg/dL — ABNORMAL HIGH (ref 70–99)
Potassium: 4.3 meq/L (ref 3.5–5.1)
Sodium: 139 meq/L (ref 135–145)
Total Bilirubin: 1.7 mg/dL — ABNORMAL HIGH (ref 0.2–1.2)
Total Protein: 6.9 g/dL (ref 6.0–8.3)

## 2023-11-30 LAB — LIPID PANEL
Cholesterol: 162 mg/dL (ref 0–200)
HDL: 49.1 mg/dL (ref >39.00)
LDL Cholesterol: 100 mg/dL — ABNORMAL HIGH (ref 0–99)
NonHDL: 113.2
Total CHOL/HDL Ratio: 3
Triglycerides: 68 mg/dL (ref 0.0–149.0)
VLDL: 13.6 mg/dL (ref 0.0–40.0)

## 2023-11-30 LAB — HEMOGLOBIN A1C: Hgb A1c MFr Bld: 7.7 % — ABNORMAL HIGH (ref 4.6–6.5)

## 2023-11-30 NOTE — Addendum Note (Signed)
 Addended by: Malichi Palardy R on: 11/30/2023 02:54 PM   Modules accepted: Orders
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
## 2023-11-30 NOTE — Patient Instructions (Addendum)
 I will check your blood work including 18-month blood sugar today, but I suspect we will still need to try to change to the medication like Trulicity.  I am sorry to hear that was costly.  I will refer you to the clinical pharmacist to review your medications and look at options to see if we can cut some cost. Hang in there!

## 2023-11-30 NOTE — Progress Notes (Signed)
   11/30/2023  Patient ID: William Oneill, male   DOB: [birth year], 78 y.o.   MRN: [id number]  Pharmacy Quality Measure Review  This patient is appearing on a report for failing Medicare adherence measures for 2024.   I noted that he was seen in March 2025 by Dr William Oneill who recommended that he stop Januvia  and start Trulicity, however refill records so no show that Trulicity was every filled and that patient filled Januvia  for 30 day supply on 11/26/2022 at [facility].   Refill records:  Januvia  100mg  - 30 day supply filled 07/13/2023 and then 11/26/2023 Jardiance  25mg  - 30 day supply filled 07/13/2023 and 11/26/2023 Atorvastatin  10mg  - last filled for 90 days 03/18/2023 - Dr William Oneill sent in updated Rx's 08/2023 but per William Oneill Rx was not picked up and then profiled.  Metformin  1000mg  twice a day - has not been filled in the last year. Dr William Oneill sent in updated Rx 08/2023 but per William Oneill prescription was not picked up and profiled.    Assessment - low adherence for medications for diabetes and cholesteorl  Plan -  Patient will see PCP today 4/28 - send message to CMA refill history and that patient is still taking Januvia  and has not started Trulicity.  Also noted other medications that have not been filled recently.   Follow up with patient by phone in 2 to 3 weeks.   William Oneill, PharmD Clinical Pharmacist [facility] Primary Care  Population Health [phone]

## 2023-12-01 LAB — MICROALBUMIN / CREATININE URINE RATIO
Creatinine,U: 63 mg/dL
Microalb Creat Ratio: 34.7 mg/g — ABNORMAL HIGH (ref 0.0–30.0)
Microalb, Ur: 2.2 mg/dL — ABNORMAL HIGH (ref 0.0–1.9)

## 2023-12-08 ENCOUNTER — Telehealth: Payer: Self-pay

## 2023-12-08 NOTE — Telephone Encounter (Signed)
-----   Message from Benjiman Bras sent at 12/08/2023 11:25 AM EDT ----- Please call patient.  Urine test for protein was slightly elevated.  Continue diabetes treatment, including Jardiance  can help protect the kidneys, but sometimes we will recommend a low-dose of an ACE inhibitor.  We discussed that previously but been hesitant to do so due to lower blood pressure concerns.  We can discuss this further next visit.  Electrolytes were overall stable with slight elevated blood sugar and bilirubin but that has been higher previously.  Liver tests looked okay otherwise. 49-month blood sugar test is improving but still little bit too high.  Cholesterol levels have slightly improved.  Medication options can be discussed at upcoming pharmacy visit.  Let me know if there are questions.

## 2023-12-09 NOTE — Telephone Encounter (Signed)
 Called patient to discuss lab work, no answer, left a message for the patient to call back to discuss

## 2023-12-10 ENCOUNTER — Telehealth: Payer: Self-pay | Admitting: Pharmacist

## 2023-12-10 ENCOUNTER — Other Ambulatory Visit: Payer: Self-pay | Admitting: Pharmacist

## 2023-12-10 NOTE — Telephone Encounter (Signed)
 Attempt was made to contact patient by phone today for follow up by Clinical Pharmacist regarding medication cost and diabetes.   Unable to reach patient. LM on VM with my contact number [phone].  Also left a message with his emergency contact - sister Mrs. McDaniels with my CB#   Cecilie Coffee, PharmD Clinical Pharmacist [facility] Primary Care  Population Health [phone]

## 2023-12-10 NOTE — Telephone Encounter (Signed)
 Called patient to discuss lab work, no answer, left a message for the patient to call back to discuss   Mailing out letter with Unable to contact

## 2023-12-14 ENCOUNTER — Telehealth: Payer: Self-pay | Admitting: *Deleted

## 2023-12-14 NOTE — Progress Notes (Unsigned)
 Complex Care Management Care Guide Note  12/14/2023 Name: Wood Axelson MRN: [id number] DOB: [birth year]  Philliph Skov is a 78 y.o. year old male who is a primary care patient of Benjiman Bras, MD and is actively engaged with the care management team. I reached out to Sierra Dresser by phone today to assist with re-scheduling  with the Pharmacist.  Follow up plan: Unsuccessful telephone outreach attempt made. A HIPAA compliant phone message was left for the patient providing contact information and requesting a return call.  Kandis Ormond, CMA [place]  [facility], [facility] Guide Direct Dial: [phone]  Fax: [phone] Website: [email or web address]

## 2023-12-15 NOTE — Progress Notes (Signed)
 Complex Care Management Care Guide Note  12/15/2023 Name: William Oneill MRN: [id number] DOB: 07-04-1946  William Oneill is a 78 y.o. year old male who is a primary care patient of Benjiman Bras, MD and is actively engaged with the care management team. I reached out to Sierra Dresser by phone today to assist with re-scheduling  with the Pharmacist.  Follow up plan: Unsuccessful telephone outreach attempt made. A HIPAA compliant phone message was left for the patient providing contact information and requesting a return call. No further outreach attempts will be made due to inability to maintain patient contact.   Kandis Ormond, CMA [place]  [facility], [facility] Guide Direct Dial: [phone]  Fax: [phone] Website: [email or web address]

## 2024-01-12 ENCOUNTER — Ambulatory Visit (INDEPENDENT_AMBULATORY_CARE_PROVIDER_SITE_OTHER): Admitting: *Deleted

## 2024-01-12 DIAGNOSIS — Z0000 Encounter for general adult medical examination without abnormal findings: Secondary | ICD-10-CM

## 2024-01-12 NOTE — Patient Instructions (Signed)
 Mr. Langland , Thank you for taking time to come for your Medicare Wellness Visit. I appreciate your ongoing commitment to your health goals. Please review the following plan we discussed and let me know if I can assist you in the future.   Screening recommendations/referrals: Colonoscopy: no longer required Recommended yearly ophthalmology/optometry visit for glaucoma screening and checkup Recommended yearly dental visit for hygiene and checkup  Vaccinations: Influenza vaccine: up to date Pneumococcal vaccine: up to date Tdap vaccine: up to date     Preventive Care 65 Years and Older, Male Preventive care refers to lifestyle choices and visits with your health care provider that can promote health and wellness. What does preventive care include? A yearly physical exam. This is also called an annual well check. Dental exams once or twice a year. Routine eye exams. Ask your health care provider how often you should have your eyes checked. Personal lifestyle choices, including: Daily care of your teeth and gums. Regular physical activity. Eating a healthy diet. Avoiding tobacco and drug use. Limiting alcohol use. Practicing safe sex. Taking low doses of aspirin  every day. Taking vitamin and mineral supplements as recommended by your health care provider. What happens during an annual well check? The services and screenings done by your health care provider during your annual well check will depend on your age, overall health, lifestyle risk factors, and family history of disease. Counseling  Your health care provider may ask you questions about your: Alcohol use. Tobacco use. Drug use. Emotional well-being. Home and relationship well-being. Sexual activity. Eating habits. History of falls. Memory and ability to understand (cognition). Work and work Astronomer. Screening  You may have the following tests or measurements: Height, weight, and BMI. Blood pressure. Lipid and  cholesterol levels. These may be checked every 5 years, or more frequently if you are over 9 years old. Skin check. Lung cancer screening. You may have this screening every year starting at age 8 if you have a 30-pack-year history of smoking and currently smoke or have quit within the past 15 years. Fecal occult blood test (FOBT) of the stool. You may have this test every year starting at age 47. Flexible sigmoidoscopy or colonoscopy. You may have a sigmoidoscopy every 5 years or a colonoscopy every 10 years starting at age 62. Prostate cancer screening. Recommendations will vary depending on your family history and other risks. Hepatitis C blood test. Hepatitis B blood test. Sexually transmitted disease (STD) testing. Diabetes screening. This is done by checking your blood sugar (glucose) after you have not eaten for a while (fasting). You may have this done every 1-3 years. Abdominal aortic aneurysm (AAA) screening. You may need this if you are a current or former smoker. Osteoporosis. You may be screened starting at age 36 if you are at high risk. Talk with your health care provider about your test results, treatment options, and if necessary, the need for more tests. Vaccines  Your health care provider may recommend certain vaccines, such as: Influenza vaccine. This is recommended every year. Tetanus, diphtheria, and acellular pertussis (Tdap, Td) vaccine. You may need a Td booster every 10 years. Zoster vaccine. You may need this after age 75. Pneumococcal 13-valent conjugate (PCV13) vaccine. One dose is recommended after age 80. Pneumococcal polysaccharide (PPSV23) vaccine. One dose is recommended after age 16. Talk to your health care provider about which screenings and vaccines you need and how often you need them. This information is not intended to replace advice given to you  by your health care provider. Make sure you discuss any questions you have with your health care  provider. Document Released: 08/17/2015 Document Revised: 04/09/2016 Document Reviewed: 05/22/2015 Elsevier Interactive Patient Education  2017 [employer].  Fall Prevention in the Home Falls can cause injuries. They can happen to people of all ages. There are many things you can do to make your home safe and to help prevent falls. What can I do on the outside of my home? Regularly fix the edges of walkways and driveways and fix any cracks. Remove anything that might make you trip as you walk through a door, such as a raised step or threshold. Trim any bushes or trees on the path to your home. Use bright outdoor lighting. Clear any walking paths of anything that might make someone trip, such as rocks or tools. Regularly check to see if handrails are loose or broken. Make sure that both sides of any steps have handrails. Any raised decks and porches should have guardrails on the edges. Have any leaves, snow, or ice cleared regularly. Use sand or salt on walking paths during winter. Clean up any spills in your garage right away. This includes oil or grease spills. What can I do in the bathroom? Use night lights. Install grab bars by the toilet and in the tub and shower. Do not use towel bars as grab bars. Use non-skid mats or decals in the tub or shower. If you need to sit down in the shower, use a plastic, non-slip stool. Keep the floor dry. Clean up any water that spills on the floor as soon as it happens. Remove soap buildup in the tub or shower regularly. Attach bath mats securely with double-sided non-slip rug tape. Do not have throw rugs and other things on the floor that can make you trip. What can I do in the bedroom? Use night lights. Make sure that you have a light by your bed that is easy to reach. Do not use any sheets or blankets that are too big for your bed. They should not hang down onto the floor. Have a firm chair that has side arms. You can use this for support while  you get dressed. Do not have throw rugs and other things on the floor that can make you trip. What can I do in the kitchen? Clean up any spills right away. Avoid walking on wet floors. Keep items that you use a lot in easy-to-reach places. If you need to reach something above you, use a strong step stool that has a grab bar. Keep electrical cords out of the way. Do not use floor polish or wax that makes floors slippery. If you must use wax, use non-skid floor wax. Do not have throw rugs and other things on the floor that can make you trip. What can I do with my stairs? Do not leave any items on the stairs. Make sure that there are handrails on both sides of the stairs and use them. Fix handrails that are broken or loose. Make sure that handrails are as long as the stairways. Check any carpeting to make sure that it is firmly attached to the stairs. Fix any carpet that is loose or worn. Avoid having throw rugs at the top or bottom of the stairs. If you do have throw rugs, attach them to the floor with carpet tape. Make sure that you have a light switch at the top of the stairs and the bottom of the stairs. If  you do not have them, ask someone to add them for you. What else can I do to help prevent falls? Wear shoes that: Do not have high heels. Have rubber bottoms. Are comfortable and fit you well. Are closed at the toe. Do not wear sandals. If you use a stepladder: Make sure that it is fully opened. Do not climb a closed stepladder. Make sure that both sides of the stepladder are locked into place. Ask someone to hold it for you, if possible. Clearly mark and make sure that you can see: Any grab bars or handrails. First and last steps. Where the edge of each step is. Use tools that help you move around (mobility aids) if they are needed. These include: Canes. Walkers. Scooters. Crutches. Turn on the lights when you go into a dark area. Replace any light bulbs as soon as they burn  out. Set up your furniture so you have a clear path. Avoid moving your furniture around. If any of your floors are uneven, fix them. If there are any pets around you, be aware of where they are. Review your medicines with your doctor. Some medicines can make you feel dizzy. This can increase your chance of falling. Ask your doctor what other things that you can do to help prevent falls. This information is not intended to replace advice given to you by your health care provider. Make sure you discuss any questions you have with your health care provider. Document Released: 05/17/2009 Document Revised: 12/27/2015 Document Reviewed: 08/25/2014 Elsevier Interactive Patient Education  2017 [employer].

## 2024-01-12 NOTE — Progress Notes (Signed)
 Subjective:   William Oneill is a 78 y.o. male who presents for Medicare Annual/Subsequent preventive examination.  Visit Complete: Virtual I connected with  Kiyoto Slomski on 01/12/24 by a audio enabled telemedicine application and verified that I am speaking with the correct person using two identifiers.  Patient Location: Home  Provider Location: Home Office  I discussed the limitations of evaluation and management by telemedicine. The patient expressed understanding and agreed to proceed.  Vital Signs: Because this visit was a virtual/telehealth visit, some criteria may be missing or patient reported. Any vitals not documented were not able to be obtained and vitals that have been documented are patient reported..  Cardiac Risk Factors include: advanced age (>21men, >32 women);diabetes mellitus;male gender;family history of premature cardiovascular disease     Objective:     There were no vitals filed for this visit. There is no height or weight on file to calculate BMI.     01/12/2024    1:15 PM 09/03/2023    3:32 PM 11/19/2022    2:47 PM 04/24/2021    9:50 AM 02/12/2021    4:20 PM 11/09/2020   12:37 PM 08/23/2020    1:20 PM  Advanced Directives  Does Patient Have a Medical Advance Directive? No No No No No No No  Would patient like information on creating a medical advance directive? No - Patient declined No - Patient declined No - Patient declined  No - Guardian declined No - Patient declined     Current Medications (verified) Outpatient Encounter Medications as of 01/12/2024  Medication Sig   aspirin  EC 81 MG EC tablet Take 1 tablet (81 mg total) by mouth daily. Swallow whole.   atorvastatin  (LIPITOR) 10 MG tablet Take 1 tablet (10 mg total) by mouth daily.   blood glucose meter kit and supplies Dispense based on patient and insurance preference. Use up to four times daily as directed. (FOR ICD-9 250.00, 250.01).   empagliflozin  (JARDIANCE ) 25 MG TABS tablet Take  1 tablet (25 mg total) by mouth daily before breakfast.   glucose blood (ONETOUCH ULTRA) test strip CHECK BLOOD GLUCOSE (SUGAR) UP TO 4 TIMES DAILY   Lancets (ACCU-CHEK SOFT TOUCH) lancets Use as instructed   metFORMIN  (GLUCOPHAGE ) 1000 MG tablet Take 1 tablet (1,000 mg total) by mouth 2 (two) times daily with a meal.   Multiple Vitamin (MULTIVITAMIN WITH MINERALS) TABS tablet Take 1 tablet by mouth daily.   MYRBETRIQ 25 MG TB24 tablet Take 25 mg by mouth daily.   tamsulosin (FLOMAX) 0.4 MG CAPS capsule Take 0.4 mg by mouth in the morning and at bedtime.   traZODone  (DESYREL ) 100 MG tablet Take 1 tablet (100 mg total) by mouth at bedtime.   Dulaglutide (TRULICITY) 0.75 MG/0.5ML SOAJ Inject 0.75 mg into the skin once a week. (Patient not taking: Reported on 11/30/2023)   No facility-administered encounter medications on file as of 01/12/2024.    Allergies (verified) Patient has no known allergies.   History: Past Medical History:  Diagnosis Date   A-fib (HCC)    "years ago, for a short period, never came back"   Anemia    Cancer (HCC)    Cancer (HCC)    colon cancer   Colon cancer (HCC)    Diabetes mellitus without complication (HCC)    type 2   Diabetes mellitus without complication (HCC)    Dysrhythmia    High cholesterol    History of kidney stones    passed  Insomnia    Pneumonia    x1   Past Surgical History:  Procedure Laterality Date   CHOLECYSTECTOMY N/A 05/22/2021   Procedure: LAPAROSCOPIC CHOLECYSTECTOMY;  Surgeon: Oza Blumenthal, MD;  Location: [facility] OR;  Service: General;  Laterality: N/A;   COLON SURGERY     laparoscopic ascending colectomy  Dr. Cherlynn Cornfield 02-02-18   COLONOSCOPY     LAPAROSCOPIC PARTIAL COLECTOMY N/A 02/02/2018   Procedure: LAPAROSCOPIC ASCENDING COLECTOMY;  Surgeon: Lockie Rima, MD;  Location: [facility];  Service: General;  Laterality: N/A;   PORT-A-CATH REMOVAL Left 02/23/2020   Procedure: REMOVAL PORT-A-CATH;  Surgeon: Lockie Rima, MD;  Location:  [facility] OR;  Service: General;  Laterality: Left;   PORTACATH PLACEMENT Left 03/03/2018   Procedure: INSERTION PORT-A-CATH;  Surgeon: Lockie Rima, MD;  Location: [facility];  Service: General;  Laterality: Left;   TONSILLECTOMY     as a child   Family History  Problem Relation Age of Onset   CAD Father    CAD Sister    CAD Brother    Heart disease Father    Heart attack Father    Heart disease Sister    Diabetes Sister    Stroke Mother    Social History   Socioeconomic History   Marital status: Single    Spouse name: Not on file   Number of children: Not on file   Years of education: Not on file   Highest education level: Not on file  Occupational History   Not on file  Tobacco Use   Smoking status: Never   Smokeless tobacco: Never  Vaping Use   Vaping status: Never Used  Substance and Sexual Activity   Alcohol use: Not Currently   Drug use: Never   Sexual activity: Yes  Other Topics Concern   Not on file  Social History Narrative   ** Merged History Encounter **       Social Drivers of Health   Financial Resource Strain: Low Risk  (01/12/2024)   Overall Financial Resource Strain (CARDIA)    Difficulty of Paying Living Expenses: Not hard at all  Food Insecurity: No Food Insecurity (01/12/2024)   Hunger Vital Sign    Worried About Running Out of Food in the Last Year: Never true    Ran Out of Food in the Last Year: Never true  Transportation Needs: No Transportation Needs (01/12/2024)   PRAPARE - Administrator, Civil Service (Medical): No    Lack of Transportation (Non-Medical): No  Physical Activity: Sufficiently Active (01/12/2024)   Exercise Vital Sign    Days of Exercise per Week: 4 days    Minutes of Exercise per Session: 40 min  Stress: No Stress Concern Present (01/12/2024)   [employer] of Occupational Health - Occupational Stress Questionnaire    Feeling of Stress : Not at all  Social Connections: Moderately Isolated  (01/12/2024)   Social Connection and Isolation Panel [NHANES]    Frequency of Communication with Friends and Family: More than three times a week    Frequency of Social Gatherings with Friends and Family: Three times a week    Attends Religious Services: More than 4 times per year    Active Member of Clubs or Organizations: No    Attends Banker Meetings: Never    Marital Status: Never married    Tobacco Counseling Counseling given: Not Answered   Clinical Intake:  Pre-visit preparation completed: No  Pain : No/denies pain  Diabetes: Yes CBG done?: No Did pt. bring in CBG monitor from home?: No  How often do you need to have someone help you when you read instructions, pamphlets, or other written materials from your doctor or pharmacy?: 1 - Never  Interpreter Needed?: No  Information entered by :: Kieth Pelt LPN   Activities of Daily Living    01/12/2024    1:18 PM  In your present state of health, do you have any difficulty performing the following activities:  Hearing? 1  Vision? 0  Difficulty concentrating or making decisions? 0  Walking or climbing stairs? 0  Dressing or bathing? 0  Doing errands, shopping? 0  Preparing Food and eating ? N  Using the Toilet? N  In the past six months, have you accidently leaked urine? N  Do you have problems with loss of bowel control? N  Managing your Medications? N  Managing your Finances? N    Patient Care Team: Benjiman Bras, MD as PCP - General (Family Medicine) Devin Foerster, MD as Consulting Physician (Ophthalmology) Lockie Rima, MD as Consulting Physician (Surgical Oncology) Alvis Jourdain, MD as Consulting Physician (Gastroenterology) Sonja Seminole, MD as Consulting Physician (Hematology) Benjiman Bras, MD (Family Medicine)  Indicate any recent Medical Services you may have received from other than [facility] providers in the past year (date may be approximate).     Assessment:    This  is a routine wellness examination for Kamarian.  Hearing/Vision screen Hearing Screening - Comments:: Bilateral hearing aid Vision Screening - Comments:: Up to date [facility]   Goals Addressed             This Visit's Progress    Patient Stated   On track    Increase walking     Patient Stated   On track    Continue current lifestyle     Patient Stated       Continue current health       Depression Screen    01/12/2024    1:18 PM 10/28/2023    2:44 PM 08/27/2023    2:02 PM 02/23/2023    2:54 PM 11/24/2022    2:40 PM 11/19/2022    2:39 PM 05/01/2022   10:38 AM  PHQ 2/9 Scores  PHQ - 2 Score 0 0 0 0 0 1 0  PHQ- 9 Score 0 0 0 0 0 1 0    Fall Risk    01/12/2024    1:15 PM 10/28/2023    2:44 PM 08/27/2023    2:02 PM 02/23/2023    2:54 PM 11/24/2022    2:40 PM  Fall Risk   Falls in the past year? 0 0 0 0 0  Number falls in past yr: 0 0 0 0   Injury with Fall? 0 0 0 0   Risk for fall due to :  No Fall Risks No Fall Risks No Fall Risks No Fall Risks  Follow up Falls evaluation completed;Education provided;Falls prevention discussed Falls evaluation completed Falls evaluation completed Falls evaluation completed     MEDICARE RISK AT HOME: Medicare Risk at Home Any stairs in or around the home?: No If so, are there any without handrails?: No Home free of loose throw rugs in walkways, pet beds, electrical cords, etc?: Yes Adequate lighting in your home to reduce risk of falls?: Yes Life alert?: No Use of a cane, walker or w/c?: No Grab bars in the bathroom?: No Shower chair or bench in shower?: No Elevated  toilet seat or a handicapped toilet?: Yes  TIMED UP AND GO:  Was the test performed?  No    Cognitive Function:        01/12/2024    1:16 PM 11/19/2022    2:37 PM 06/08/2019    1:20 PM  6CIT Screen  What Year? 0 points 0 points 0 points  What month? 0 points 0 points 0 points  What time? 0 points 0 points 0 points  Count back from 20 0 points 2 points 0 points   Months in reverse 0 points 0 points 0 points  Repeat phrase 0 points 0 points 0 points  Total Score 0 points 2 points 0 points    Immunizations Immunization History  Administered Date(s) Administered   Fluad Quad(high Dose 65+) 06/08/2019, 05/10/2020, 04/25/2022   Influenza, High Dose Seasonal PF 06/07/2018   Influenza,inj,Quad PF,6+ Mos 06/25/2015, 04/10/2016, 04/30/2017   Influenza-Unspecified 06/07/2018   Moderna Sars-Covid-2 Vaccination 10/14/2019, 08/09/2020, 05/14/2021   PNEUMOCOCCAL CONJUGATE-20 08/27/2023   Pneumococcal Conjugate-13 06/25/2015   Pneumococcal Polysaccharide-23 10/03/2015   Tdap 04/02/2015, 08/24/2018, 09/21/2019   Zoster, Unspecified 04/25/2022    TDAP status: Up to date  Flu Vaccine status: Up to date  Pneumococcal vaccine status: Up to date  Covid-19 vaccine status: Information provided on how to obtain vaccines.   Qualifies for Shingles Vaccine? Yes   Zostavax completed No   Shingrix Completed?: No.    Education has been provided regarding the importance of this vaccine. Patient has been advised to call insurance company to determine out of pocket expense if they have not yet received this vaccine. Advised may also receive vaccine at local pharmacy or Health Dept. Verbalized acceptance and understanding.  Screening Tests Health Maintenance  Topic Date Due   Zoster Vaccines- Shingrix (1 of 2) 04[birth year]22/1965   COVID-19 Vaccine (4 - 2024-25 season) 04/05/2023   INFLUENZA VACCINE  03/04/2024   OPHTHALMOLOGY EXAM  04/28/2024   HEMOGLOBIN A1C  05/31/2024   FOOT EXAM  08/26/2024   Diabetic kidney evaluation - eGFR measurement  11/29/2024   Diabetic kidney evaluation - Urine ACR  11/29/2024   Medicare Annual Wellness (AWV)  01/11/2025   DTaP/Tdap/Td (4 - Td or Tdap) 09/20/2029   Pneumonia Vaccine 108+ Years old  Completed   Hepatitis C Screening  Completed   HPV VACCINES  Aged Out   Meningococcal B Vaccine  Aged Out   Colonoscopy  Discontinued     Health Maintenance  Health Maintenance Due  Topic Date Due   Zoster Vaccines- Shingrix (1 of 2) 04/22/1965   COVID-19 Vaccine (4 - 2024-25 season) 04/05/2023    Colorectal cancer screening: No longer required.   Lung Cancer Screening: (Low Dose CT Chest recommended if Age 38-80 years, 20 pack-year currently smoking OR have quit w/in 15years.) does not qualify.   Lung Cancer Screening Referral:   Additional Screening:  Hepatitis C Screening: does not qualify; Completed 2019  Vision Screening: Recommended annual ophthalmology exams for early detection of glaucoma and other disorders of the eye. Is the patient up to date with their annual eye exam?  Yes  Who is the provider or what is the name of the office in which the patient attends annual eye exams? [facility] If pt is not established with a provider, would they like to be referred to a provider to establish care? No .   Dental Screening: Recommended annual dental exams for proper oral hygiene  Nutrition Risk Assessment:  Has the patient had any N/V/D  within the last 2 months?  No  Does the patient have any non-healing wounds?  No  Has the patient had any unintentional weight loss or weight gain?  No   Diabetes:  Is the patient diabetic?  Yes  If diabetic, was a CBG obtained today?  No  Did the patient bring in their glucometer from home?  No  How often do you monitor your CBG's? 1time a day.   Financial Strains and Diabetes Management:  Are you having any financial strains with the device, your supplies or your medication? No .  Does the patient want to be seen by Chronic Care Management for management of their diabetes?  No  Would the patient like to be referred to a Nutritionist or for Diabetic Management?  No   Diabetic Exams:  Diabetic Eye Exam: Complete  Diabetic Foot Exam: Completed  Community Resource Referral / Chronic Care Management: CRR required this visit?  No   CCM required this visit?  No      Plan:     I have personally reviewed and noted the following in the patient's chart:   Medical and social history Use of alcohol, tobacco or illicit drugs  Current medications and supplements including opioid prescriptions. Patient is not currently taking opioid prescriptions. Functional ability and status Nutritional status Physical activity Advanced directives List of other physicians Hospitalizations, surgeries, and ER visits in previous 12 months Vitals Screenings to include cognitive, depression, and falls Referrals and appointments  In addition, I have reviewed and discussed with patient certain preventive protocols, quality metrics, and best practice recommendations. A written personalized care plan for preventive services as well as general preventive health recommendations were provided to patient.     Kieth Pelt, LPN   [id number]/61/[id number]   After Visit Summary: (MyChart) Due to this being a telephonic visit, the after visit summary with patients personalized plan was offered to patient via MyChart   Nurse Notes:

## 2024-02-24 ENCOUNTER — Ambulatory Visit: Payer: Medicare Other | Admitting: Family Medicine

## 2024-02-24 DIAGNOSIS — R079 Chest pain, unspecified: Secondary | ICD-10-CM | POA: Insufficient documentation

## 2024-02-24 DIAGNOSIS — K8020 Calculus of gallbladder without cholecystitis without obstruction: Secondary | ICD-10-CM | POA: Insufficient documentation

## 2024-02-24 DIAGNOSIS — R195 Other fecal abnormalities: Secondary | ICD-10-CM | POA: Insufficient documentation

## 2024-02-24 DIAGNOSIS — Z85038 Personal history of other malignant neoplasm of large intestine: Secondary | ICD-10-CM | POA: Insufficient documentation

## 2024-02-24 DIAGNOSIS — G4730 Sleep apnea, unspecified: Secondary | ICD-10-CM | POA: Insufficient documentation

## 2024-02-24 DIAGNOSIS — D509 Iron deficiency anemia, unspecified: Secondary | ICD-10-CM | POA: Insufficient documentation

## 2024-02-24 DIAGNOSIS — K219 Gastro-esophageal reflux disease without esophagitis: Secondary | ICD-10-CM | POA: Insufficient documentation

## 2024-03-03 ENCOUNTER — Ambulatory Visit: Admitting: Family Medicine

## 2024-04-02 ENCOUNTER — Other Ambulatory Visit: Payer: Self-pay | Admitting: Family Medicine

## 2024-04-02 DIAGNOSIS — G4700 Insomnia, unspecified: Secondary | ICD-10-CM

## 2024-04-06 ENCOUNTER — Ambulatory Visit (INDEPENDENT_AMBULATORY_CARE_PROVIDER_SITE_OTHER): Admitting: Family Medicine

## 2024-04-06 VITALS — BP 126/70 | HR 72 | Temp 98.3°F | Ht 66.0 in | Wt 177.0 lb

## 2024-04-06 DIAGNOSIS — I4949 Other premature depolarization: Secondary | ICD-10-CM

## 2024-04-06 DIAGNOSIS — E1165 Type 2 diabetes mellitus with hyperglycemia: Secondary | ICD-10-CM

## 2024-04-06 DIAGNOSIS — E785 Hyperlipidemia, unspecified: Secondary | ICD-10-CM | POA: Diagnosis not present

## 2024-04-06 DIAGNOSIS — G4700 Insomnia, unspecified: Secondary | ICD-10-CM

## 2024-04-06 DIAGNOSIS — Z794 Long term (current) use of insulin: Secondary | ICD-10-CM

## 2024-04-06 DIAGNOSIS — Z23 Encounter for immunization: Secondary | ICD-10-CM | POA: Diagnosis not present

## 2024-04-06 MED ORDER — METFORMIN HCL 1000 MG PO TABS: 1000.0000 mg | ORAL_TABLET | Freq: Two times a day (BID) | ORAL | 1 refills | Status: AC

## 2024-04-06 MED ORDER — ATORVASTATIN CALCIUM 10 MG PO TABS: 10.0000 mg | ORAL_TABLET | Freq: Every day | ORAL | 1 refills | Status: AC

## 2024-04-06 MED ORDER — EMPAGLIFLOZIN 25 MG PO TABS: 25.0000 mg | ORAL_TABLET | Freq: Every day | ORAL | 1 refills | Status: AC

## 2024-04-06 MED ORDER — JANUVIA 100 MG PO TABS: 100.0000 mg | ORAL_TABLET | Freq: Every day | ORAL | 1 refills | Status: AC

## 2024-04-06 NOTE — Patient Instructions (Signed)
 No apparent concerns on your EKG today.  No change in medicines for now.  I will check some labs including the diabetes level.  Continue Januvia , Jardiance , metformin  for now.  I am also checking your cholesterol levels and we will let you know if any changes needed.  Follow-up in 3 months, let me know if there are questions in the meantime.
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
## 2024-04-07 LAB — CBC
HCT: 44.2 % (ref 39.0–52.0)
Hemoglobin: 14.9 g/dL (ref 13.0–17.0)
MCHC: 33.7 g/dL (ref 30.0–36.0)
MCV: 92.1 fl (ref 78.0–100.0)
Platelets: 251 K/uL (ref 150.0–400.0)
RBC: 4.79 Mil/uL (ref 4.22–5.81)
RDW: 13.5 % (ref 11.5–15.5)
WBC: 7.9 K/uL (ref 4.0–10.5)

## 2024-04-07 LAB — COMPREHENSIVE METABOLIC PANEL WITH GFR
ALT: 17 U/L (ref 0–53)
AST: 20 U/L (ref 0–37)
Albumin: 4.2 g/dL (ref 3.5–5.2)
Alkaline Phosphatase: 56 U/L (ref 39–117)
BUN: 24 mg/dL — ABNORMAL HIGH (ref 6–23)
CO2: 30 meq/L (ref 19–32)
Calcium: 9.1 mg/dL (ref 8.4–10.5)
Chloride: 103 meq/L (ref 96–112)
Creatinine, Ser: 0.96 mg/dL (ref 0.40–1.50)
GFR: 76.54 mL/min (ref >60.00)
Glucose, Bld: 114 mg/dL — ABNORMAL HIGH (ref 70–99)
Potassium: 4.7 meq/L (ref 3.5–5.1)
Sodium: 140 meq/L (ref 135–145)
Total Bilirubin: 1.6 mg/dL — ABNORMAL HIGH (ref 0.2–1.2)
Total Protein: 6.7 g/dL (ref 6.0–8.3)

## 2024-04-07 LAB — LIPID PANEL
Cholesterol: 147 mg/dL (ref 0–200)
HDL: 53.8 mg/dL (ref >39.00)
LDL Cholesterol: 69 mg/dL (ref 0–99)
NonHDL: 92.73
Total CHOL/HDL Ratio: 3
Triglycerides: 119 mg/dL (ref 0.0–149.0)
VLDL: 23.8 mg/dL (ref 0.0–40.0)

## 2024-04-07 LAB — TSH: TSH: 1.52 u[IU]/mL (ref 0.35–5.50)

## 2024-04-07 LAB — HEMOGLOBIN A1C: Hgb A1c MFr Bld: 6.9 % — ABNORMAL HIGH (ref 4.6–6.5)

## 2024-04-08 ENCOUNTER — Ambulatory Visit: Payer: Self-pay | Admitting: Family Medicine

## 2024-04-11 ENCOUNTER — Telehealth: Payer: Self-pay | Admitting: Family Medicine

## 2024-04-11 NOTE — Telephone Encounter (Signed)
 Pt returned call from [facility] regarding lab results but pt had just come into office to pick up lab results. He did not have any questions at this time

## 2024-04-11 NOTE — Progress Notes (Signed)
 Called patient to relay lab results. Patient left vm to return call.

## 2024-04-11 NOTE — Progress Notes (Signed)
 Patient came into office to obtain lab results and has no further questions

## 2024-04-12 ENCOUNTER — Encounter: Payer: Self-pay | Admitting: Family Medicine

## 2024-04-27 LAB — HM DIABETES EYE EXAM

## 2024-07-06 ENCOUNTER — Encounter: Payer: Self-pay | Admitting: Family Medicine

## 2024-07-06 ENCOUNTER — Ambulatory Visit: Admitting: Family Medicine

## 2024-07-06 VITALS — BP 134/64 | HR 66 | Temp 98.2°F | Resp 14 | Ht 66.0 in | Wt 174.2 lb

## 2024-07-06 DIAGNOSIS — E1165 Type 2 diabetes mellitus with hyperglycemia: Secondary | ICD-10-CM

## 2024-07-06 NOTE — Patient Instructions (Signed)
 Thank you for coming in today. No change in medications at this time. If there are any concerns on your bloodwork, I will let you know. Take care!
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
## 2024-07-07 LAB — COMPREHENSIVE METABOLIC PANEL WITH GFR
ALT: 17 U/L (ref 0–53)
AST: 19 U/L (ref 0–37)
Albumin: 4.3 g/dL (ref 3.5–5.2)
Alkaline Phosphatase: 61 U/L (ref 39–117)
BUN: 24 mg/dL — ABNORMAL HIGH (ref 6–23)
CO2: 27 meq/L (ref 19–32)
Calcium: 9.1 mg/dL (ref 8.4–10.5)
Chloride: 104 meq/L (ref 96–112)
Creatinine, Ser: 0.88 mg/dL (ref 0.40–1.50)
GFR: 83.12 mL/min (ref >60.00)
Glucose, Bld: 92 mg/dL (ref 70–99)
Potassium: 4.4 meq/L (ref 3.5–5.1)
Sodium: 137 meq/L (ref 135–145)
Total Bilirubin: 1.8 mg/dL — ABNORMAL HIGH (ref 0.2–1.2)
Total Protein: 6.8 g/dL (ref 6.0–8.3)

## 2024-07-07 LAB — HEMOGLOBIN A1C: Hgb A1c MFr Bld: 6.4 % (ref 4.6–6.5)

## 2024-07-10 ENCOUNTER — Ambulatory Visit: Payer: Self-pay | Admitting: Family Medicine

## 2024-07-11 NOTE — Telephone Encounter (Signed)
 Copied from CRM [id number]. Topic: Clinical - Lab/Test Results >> Jul 11, 2024 12:09 PM Alexandria E wrote: Reason for CRM: Patient returning call from office, located that it was in regards to lab results. Told patient that I could relay the note that was attached from PCP, but patient stated that he would rather have these results mailed to the address on file instead.

## 2024-07-11 NOTE — Progress Notes (Signed)
 Tried to call patient to relay lab results. Left vm to return call.

## 2024-07-11 NOTE — Progress Notes (Signed)
 Sent to pt.

## 2024-07-25 ENCOUNTER — Telehealth: Payer: Self-pay | Admitting: Pharmacist

## 2024-07-25 NOTE — Telephone Encounter (Signed)
 Pharmacy Quality Measure Review  This patient is appearing on a report for being at risk of failing the adherence measure for cholesterol (statin) medications this calendar year.   Medication: atorvastatin  Last fill date: 04/19/2024 for 90 day supply  Patient has 1 refill remaining. Next appointment with PCP is 10/13/2023.   Called [facility] to see if patient had requested refill yet - last refill still was 04/19/2024. Verified he has Rx on file with them with active refills.  Left voicemail for patient to return my call at their convenience. Left my CB# [phone]  Madelin Ray, PharmD Clinical Pharmacist [facility]

## 2024-08-09 ENCOUNTER — Ambulatory Visit: Payer: Self-pay | Admitting: Family Medicine

## 2024-08-09 LAB — OPHTHALMOLOGY REPORT-SCANNED

## 2024-10-05 ENCOUNTER — Ambulatory Visit: Admitting: Family Medicine

## 2024-10-12 ENCOUNTER — Encounter: Admitting: Family Medicine

## 2025-01-17 ENCOUNTER — Encounter
